# Patient Record
Sex: Female | Born: 1955
Health system: Southern US, Community
[De-identification: ages and names within clinical notes are randomized; demographics above are authoritative.]

## PROBLEM LIST (undated history)

## (undated) DIAGNOSIS — M1990 Unspecified osteoarthritis, unspecified site: Secondary | ICD-10-CM

## (undated) DIAGNOSIS — J9612 Chronic respiratory failure with hypercapnia: Secondary | ICD-10-CM

## (undated) DIAGNOSIS — J449 Chronic obstructive pulmonary disease, unspecified: Secondary | ICD-10-CM

## (undated) DIAGNOSIS — I10 Essential (primary) hypertension: Secondary | ICD-10-CM

## (undated) DIAGNOSIS — J9611 Chronic respiratory failure with hypoxia: Secondary | ICD-10-CM

## (undated) DIAGNOSIS — J45909 Unspecified asthma, uncomplicated: Secondary | ICD-10-CM

## (undated) DIAGNOSIS — C801 Malignant (primary) neoplasm, unspecified: Secondary | ICD-10-CM

## (undated) HISTORY — PX: TRACHEOSTOMY: SUR1362

## (undated) HISTORY — PX: TRACHEOSTOMY CLOSURE: SHX458

## (undated) HISTORY — PX: ABDOMINAL HYSTERECTOMY: SHX81

---

## 2016-06-26 LAB — PULMONARY FUNCTION TEST

## 2017-02-19 LAB — PULMONARY FUNCTION TEST

## 2018-10-28 ENCOUNTER — Other Ambulatory Visit: Payer: Self-pay

## 2018-10-28 ENCOUNTER — Encounter (HOSPITAL_COMMUNITY): Payer: Self-pay

## 2018-10-28 ENCOUNTER — Inpatient Hospital Stay (HOSPITAL_COMMUNITY)
Admission: EM | Admit: 2018-10-28 | Discharge: 2018-11-05 | DRG: 189 | Disposition: A | Discharge status: Home Health | Payer: Medicaid Other | Attending: Internal Medicine | Admitting: Internal Medicine

## 2018-10-28 ENCOUNTER — Emergency Department (HOSPITAL_COMMUNITY): Payer: Medicaid Other

## 2018-10-28 DIAGNOSIS — Z6837 Body mass index (BMI) 37.0-37.9, adult: Secondary | ICD-10-CM | POA: Diagnosis not present

## 2018-10-28 DIAGNOSIS — R739 Hyperglycemia, unspecified: Secondary | ICD-10-CM

## 2018-10-28 DIAGNOSIS — E872 Acidosis: Secondary | ICD-10-CM | POA: Diagnosis present

## 2018-10-28 DIAGNOSIS — J9621 Acute and chronic respiratory failure with hypoxia: Secondary | ICD-10-CM | POA: Diagnosis present

## 2018-10-28 DIAGNOSIS — J441 Chronic obstructive pulmonary disease with (acute) exacerbation: Secondary | ICD-10-CM

## 2018-10-28 DIAGNOSIS — R05 Cough: Secondary | ICD-10-CM | POA: Diagnosis present

## 2018-10-28 DIAGNOSIS — Z9981 Dependence on supplemental oxygen: Secondary | ICD-10-CM

## 2018-10-28 DIAGNOSIS — D649 Anemia, unspecified: Secondary | ICD-10-CM | POA: Diagnosis present

## 2018-10-28 DIAGNOSIS — M1990 Unspecified osteoarthritis, unspecified site: Secondary | ICD-10-CM | POA: Diagnosis present

## 2018-10-28 DIAGNOSIS — R059 Cough, unspecified: Secondary | ICD-10-CM

## 2018-10-28 DIAGNOSIS — I10 Essential (primary) hypertension: Secondary | ICD-10-CM | POA: Diagnosis present

## 2018-10-28 DIAGNOSIS — T380X5A Adverse effect of glucocorticoids and synthetic analogues, initial encounter: Secondary | ICD-10-CM | POA: Diagnosis not present

## 2018-10-28 DIAGNOSIS — J9622 Acute and chronic respiratory failure with hypercapnia: Secondary | ICD-10-CM | POA: Diagnosis present

## 2018-10-28 DIAGNOSIS — R0602 Shortness of breath: Secondary | ICD-10-CM

## 2018-10-28 DIAGNOSIS — Z87891 Personal history of nicotine dependence: Secondary | ICD-10-CM | POA: Diagnosis not present

## 2018-10-28 DIAGNOSIS — J9612 Chronic respiratory failure with hypercapnia: Secondary | ICD-10-CM

## 2018-10-28 DIAGNOSIS — J9611 Chronic respiratory failure with hypoxia: Secondary | ICD-10-CM | POA: Diagnosis present

## 2018-10-28 DIAGNOSIS — E876 Hypokalemia: Secondary | ICD-10-CM | POA: Diagnosis present

## 2018-10-28 DIAGNOSIS — J449 Chronic obstructive pulmonary disease, unspecified: Secondary | ICD-10-CM | POA: Diagnosis present

## 2018-10-28 DIAGNOSIS — E669 Obesity, unspecified: Secondary | ICD-10-CM | POA: Diagnosis present

## 2018-10-28 DIAGNOSIS — E8729 Other acidosis: Secondary | ICD-10-CM

## 2018-10-28 DIAGNOSIS — F419 Anxiety disorder, unspecified: Secondary | ICD-10-CM | POA: Diagnosis present

## 2018-10-28 DIAGNOSIS — F40240 Claustrophobia: Secondary | ICD-10-CM | POA: Diagnosis present

## 2018-10-28 HISTORY — DX: Unspecified osteoarthritis, unspecified site: M19.90

## 2018-10-28 HISTORY — DX: Unspecified asthma, uncomplicated: J45.909

## 2018-10-28 HISTORY — DX: Essential (primary) hypertension: I10

## 2018-10-28 HISTORY — DX: Chronic respiratory failure with hypoxia: J96.11

## 2018-10-28 HISTORY — DX: Malignant (primary) neoplasm, unspecified: C80.1

## 2018-10-28 HISTORY — DX: Chronic obstructive pulmonary disease, unspecified: J44.9

## 2018-10-28 HISTORY — DX: Chronic respiratory failure with hypercapnia: J96.12

## 2018-10-28 LAB — CBC WITH DIFFERENTIAL/PLATELET
Abs Immature Granulocytes: 0.02 10*3/uL (ref 0.00–0.07)
Basophils Absolute: 0.1 10*3/uL (ref 0.0–0.1)
Basophils Relative: 1 %
EOS PCT: 6 %
Eosinophils Absolute: 0.4 10*3/uL (ref 0.0–0.5)
HEMATOCRIT: 38.5 % (ref 36.0–46.0)
Hemoglobin: 11.9 g/dL — ABNORMAL LOW (ref 12.0–15.0)
Immature Granulocytes: 0 %
Lymphocytes Relative: 24 %
Lymphs Abs: 1.7 10*3/uL (ref 0.7–4.0)
MCH: 28.5 pg (ref 26.0–34.0)
MCHC: 30.9 g/dL (ref 30.0–36.0)
MCV: 92.3 fL (ref 80.0–100.0)
MONO ABS: 1 10*3/uL (ref 0.1–1.0)
Monocytes Relative: 14 %
Neutro Abs: 3.9 10*3/uL (ref 1.7–7.7)
Neutrophils Relative %: 55 %
PLATELETS: 343 10*3/uL (ref 150–400)
RBC: 4.17 MIL/uL (ref 3.87–5.11)
RDW: 13.9 % (ref 11.5–15.5)
WBC: 7.1 10*3/uL (ref 4.0–10.5)
nRBC: 0 % (ref 0.0–0.2)

## 2018-10-28 LAB — POCT I-STAT 7, (LYTES, BLD GAS, ICA,H+H)
Acid-Base Excess: 5 mmol/L — ABNORMAL HIGH (ref 0.0–2.0)
Bicarbonate: 32.3 mmol/L — ABNORMAL HIGH (ref 20.0–28.0)
Calcium, Ion: 1.25 mmol/L (ref 1.15–1.40)
HCT: 35 % — ABNORMAL LOW (ref 36.0–46.0)
Hemoglobin: 11.9 g/dL — ABNORMAL LOW (ref 12.0–15.0)
O2 SAT: 100 %
PH ART: 7.32 — AB (ref 7.350–7.450)
Patient temperature: 98.6
Potassium: 3.3 mmol/L — ABNORMAL LOW (ref 3.5–5.1)
Sodium: 139 mmol/L (ref 135–145)
TCO2: 34 mmol/L — AB (ref 22–32)
pCO2 arterial: 62.8 mmHg — ABNORMAL HIGH (ref 32.0–48.0)
pO2, Arterial: 196 mmHg — ABNORMAL HIGH (ref 83.0–108.0)

## 2018-10-28 LAB — HEPATIC FUNCTION PANEL
ALT: 10 U/L (ref 0–44)
AST: 17 U/L (ref 15–41)
Albumin: 3.5 g/dL (ref 3.5–5.0)
Alkaline Phosphatase: 70 U/L (ref 38–126)
Bilirubin, Direct: <0.1 mg/dL (ref 0.0–0.2)
Total Bilirubin: 0.3 mg/dL (ref 0.3–1.2)
Total Protein: 6.9 g/dL (ref 6.5–8.1)

## 2018-10-28 LAB — BASIC METABOLIC PANEL
Anion gap: 9 (ref 5–15)
BUN: 6 mg/dL — ABNORMAL LOW (ref 8–23)
CO2: 28 mmol/L (ref 22–32)
Calcium: 9.4 mg/dL (ref 8.9–10.3)
Chloride: 102 mmol/L (ref 98–111)
Creatinine, Ser: 0.81 mg/dL (ref 0.44–1.00)
GFR calc Af Amer: >60 mL/min (ref >60)
Glucose, Bld: 134 mg/dL — ABNORMAL HIGH (ref 70–99)
Potassium: 3.7 mmol/L (ref 3.5–5.1)
Sodium: 139 mmol/L (ref 135–145)

## 2018-10-28 LAB — RESPIRATORY PANEL BY PCR
Adenovirus: NOT DETECTED
BORDETELLA PERTUSSIS-RVPCR: NOT DETECTED
Chlamydophila pneumoniae: NOT DETECTED
Coronavirus 229E: NOT DETECTED
Coronavirus HKU1: NOT DETECTED
Coronavirus NL63: NOT DETECTED
Coronavirus OC43: NOT DETECTED
INFLUENZA A-RVPPCR: NOT DETECTED
Influenza B: NOT DETECTED
Metapneumovirus: NOT DETECTED
Mycoplasma pneumoniae: NOT DETECTED
Parainfluenza Virus 1: NOT DETECTED
Parainfluenza Virus 2: NOT DETECTED
Parainfluenza Virus 3: NOT DETECTED
Parainfluenza Virus 4: NOT DETECTED
Respiratory Syncytial Virus: NOT DETECTED
Rhinovirus / Enterovirus: NOT DETECTED

## 2018-10-28 LAB — INFLUENZA PANEL BY PCR (TYPE A & B)
Influenza A By PCR: NEGATIVE
Influenza B By PCR: NEGATIVE

## 2018-10-28 LAB — LACTATE DEHYDROGENASE: LDH: 134 U/L (ref 98–192)

## 2018-10-28 LAB — HIV ANTIBODY (ROUTINE TESTING W REFLEX): HIV Screen 4th Generation wRfx: NONREACTIVE

## 2018-10-28 MED ORDER — IPRATROPIUM BROMIDE 0.02 % IN SOLN
0.5000 mg | Freq: Once | RESPIRATORY_TRACT | Status: AC
  Administered 2018-10-28: 0.5 mg via RESPIRATORY_TRACT
  Filled 2018-10-28: qty 2.5

## 2018-10-28 MED ORDER — GUAIFENESIN-DM 100-10 MG/5ML PO SYRP
5.0000 mL | ORAL_SOLUTION | ORAL | Status: DC | PRN
  Administered 2018-10-28 – 2018-11-01 (×10): 5 mL via ORAL
  Filled 2018-10-28 (×10): qty 5

## 2018-10-28 MED ORDER — SODIUM CHLORIDE 0.9 % IV SOLN
INTRAVENOUS | Status: DC
  Administered 2018-10-28 (×2): via INTRAVENOUS

## 2018-10-28 MED ORDER — BENZONATATE 100 MG PO CAPS
100.0000 mg | ORAL_CAPSULE | Freq: Three times a day (TID) | ORAL | Status: DC | PRN
  Administered 2018-10-28 – 2018-10-29 (×3): 100 mg via ORAL
  Filled 2018-10-28 (×3): qty 1

## 2018-10-28 MED ORDER — PREDNISONE 20 MG PO TABS: 40.0000 mg | ORAL_TABLET | Freq: Every day | ORAL | Status: DC

## 2018-10-28 MED ORDER — LORAZEPAM 2 MG/ML IJ SOLN
0.5000 mg | Freq: Once | INTRAMUSCULAR | Status: AC
  Administered 2018-10-28: 0.5 mg via INTRAVENOUS
  Filled 2018-10-28: qty 1

## 2018-10-28 MED ORDER — MAGNESIUM SULFATE 2 GM/50ML IV SOLN
2.0000 g | Freq: Once | INTRAVENOUS | Status: AC
  Administered 2018-10-28: 2 g via INTRAVENOUS
  Filled 2018-10-28 (×2): qty 50

## 2018-10-28 MED ORDER — METHYLPREDNISOLONE SODIUM SUCC 125 MG IJ SOLR: 60.0000 mg | Freq: Four times a day (QID) | INTRAMUSCULAR | Status: DC

## 2018-10-28 MED ORDER — TRAMADOL HCL 50 MG PO TABS
25.0000 mg | ORAL_TABLET | Freq: Three times a day (TID) | ORAL | Status: DC | PRN
  Administered 2018-10-28 – 2018-11-03 (×6): 25 mg via ORAL
  Filled 2018-10-28 (×6): qty 1

## 2018-10-28 MED ORDER — DOXYCYCLINE HYCLATE 100 MG PO TABS
100.0000 mg | ORAL_TABLET | Freq: Two times a day (BID) | ORAL | Status: DC
  Administered 2018-10-28 – 2018-11-04 (×16): 100 mg via ORAL
  Filled 2018-10-28 (×16): qty 1

## 2018-10-28 MED ORDER — LEVALBUTEROL HCL 0.63 MG/3ML IN NEBU: 0.6300 mg | INHALATION_SOLUTION | Freq: Four times a day (QID) | RESPIRATORY_TRACT | Status: DC | PRN

## 2018-10-28 MED ORDER — MOMETASONE FURO-FORMOTEROL FUM 200-5 MCG/ACT IN AERO
1.0000 | INHALATION_SPRAY | Freq: Two times a day (BID) | RESPIRATORY_TRACT | Status: DC
  Administered 2018-10-28 – 2018-11-01 (×8): 1 via RESPIRATORY_TRACT
  Filled 2018-10-28: qty 8.8

## 2018-10-28 MED ORDER — UMECLIDINIUM BROMIDE 62.5 MCG/INH IN AEPB
1.0000 | INHALATION_SPRAY | Freq: Every day | RESPIRATORY_TRACT | Status: DC
  Administered 2018-10-29 – 2018-11-02 (×5): 1 via RESPIRATORY_TRACT
  Filled 2018-10-28: qty 7

## 2018-10-28 MED ORDER — ALBUTEROL (5 MG/ML) CONTINUOUS INHALATION SOLN
10.0000 mg/h | INHALATION_SOLUTION | RESPIRATORY_TRACT | Status: DC
  Administered 2018-10-28: 10 mg/h via RESPIRATORY_TRACT
  Filled 2018-10-28: qty 20

## 2018-10-28 MED ORDER — POTASSIUM CHLORIDE CRYS ER 20 MEQ PO TBCR
40.0000 meq | EXTENDED_RELEASE_TABLET | Freq: Once | ORAL | Status: AC
  Administered 2018-10-28: 40 meq via ORAL
  Filled 2018-10-28: qty 2

## 2018-10-28 MED ORDER — ACETAMINOPHEN 500 MG PO TABS
1000.0000 mg | ORAL_TABLET | Freq: Once | ORAL | Status: AC
  Administered 2018-10-28: 1000 mg via ORAL
  Filled 2018-10-28: qty 2

## 2018-10-28 MED ORDER — LEVALBUTEROL HCL 0.63 MG/3ML IN NEBU
0.6300 mg | INHALATION_SOLUTION | Freq: Four times a day (QID) | RESPIRATORY_TRACT | Status: DC
  Administered 2018-10-28 – 2018-10-29 (×7): 0.63 mg via RESPIRATORY_TRACT
  Filled 2018-10-28 (×7): qty 3

## 2018-10-28 MED ORDER — IBUPROFEN 400 MG PO TABS
400.0000 mg | ORAL_TABLET | Freq: Four times a day (QID) | ORAL | Status: DC | PRN
  Administered 2018-10-28 – 2018-11-03 (×11): 400 mg via ORAL
  Filled 2018-10-28 (×11): qty 1

## 2018-10-28 MED ORDER — METHYLPREDNISOLONE SODIUM SUCC 125 MG IJ SOLR
60.0000 mg | Freq: Four times a day (QID) | INTRAMUSCULAR | Status: DC
  Administered 2018-10-28 – 2018-10-29 (×5): 60 mg via INTRAVENOUS
  Filled 2018-10-28 (×5): qty 2

## 2018-10-28 MED ORDER — ORAL CARE MOUTH RINSE
15.0000 mL | Freq: Two times a day (BID) | OROMUCOSAL | Status: DC
  Administered 2018-10-28 – 2018-11-01 (×6): 15 mL via OROMUCOSAL

## 2018-10-28 MED ORDER — ACETAMINOPHEN 325 MG PO TABS: 650.0000 mg | ORAL_TABLET | Freq: Four times a day (QID) | ORAL | Status: DC | PRN

## 2018-10-28 MED ORDER — METHYLPREDNISOLONE SODIUM SUCC 125 MG IJ SOLR
125.0000 mg | Freq: Once | INTRAMUSCULAR | Status: AC
  Administered 2018-10-28: 125 mg via INTRAVENOUS
  Filled 2018-10-28 (×2): qty 2

## 2018-10-28 MED ORDER — GUAIFENESIN ER 600 MG PO TB12
600.0000 mg | ORAL_TABLET | Freq: Two times a day (BID) | ORAL | Status: DC
  Administered 2018-10-28 – 2018-10-29 (×3): 600 mg via ORAL
  Filled 2018-10-28 (×3): qty 1

## 2018-10-28 MED ORDER — FENTANYL CITRATE (PF) 100 MCG/2ML IJ SOLN
50.0000 ug | Freq: Once | INTRAMUSCULAR | Status: AC
  Administered 2018-10-28: 50 ug via INTRAVENOUS
  Filled 2018-10-28: qty 2

## 2018-10-28 MED ORDER — ENOXAPARIN SODIUM 40 MG/0.4ML ~~LOC~~ SOLN
40.0000 mg | Freq: Every day | SUBCUTANEOUS | Status: DC
  Administered 2018-10-28 – 2018-11-04 (×8): 40 mg via SUBCUTANEOUS
  Filled 2018-10-28 (×10): qty 0.4

## 2018-10-28 MED ORDER — SODIUM CHLORIDE 0.9 % IV SOLN
1.0000 g | Freq: Every day | INTRAVENOUS | Status: DC
  Administered 2018-10-28: 1 g via INTRAVENOUS
  Filled 2018-10-28: qty 10

## 2018-10-28 NOTE — ED Notes (Signed)
Spoke with [facility] Charge regarding pt. Charge nurse states that unit will need to obtain another progressive care monitor before patient can be brought up.

## 2018-10-28 NOTE — Progress Notes (Signed)
Patient arrived to 4[facility] room 10 at this time. Telemetry applied and CCMD notified. V/s and assessment done. CHG bath done. Patient oriented to room and how to call nurse with any needs.  Emelda Fear, RN

## 2018-10-28 NOTE — ED Notes (Signed)
Per [facility], unable to accept the pt at this time due to their algorithm.  Charge nurse notified.

## 2018-10-28 NOTE — Evaluation (Signed)
Physical Therapy Evaluation Patient Details Name: Bianca Murray MRN: [id number] DOB: [birth year] Today's Date: 10/28/2018   History of Present Illness  Pt is a 63 y/o female admitted secondary to worsening SOB likely from COPD exacerbation. PMH includes COPD on home O2, asthma, HTN, cancer, and tracheostomy s/p reversal.   Clinical Impression  Pt admitted secondary to problem above with deficits below. Pt with shakiness during gait, requiring min guard A. Per pt, she felt shakiness was due to her medication. Feel pt will progress well and will be able to return home with HHPT with assist from her daughter at d/c. Will continue to follow acutely to maximize functional mobility independence and safety.     Follow Up Recommendations Home health PT;Supervision for mobility/OOB    Equipment Recommendations  3in1 (PT);Other (comment)(rollator )    Recommendations for Other Services       Precautions / Restrictions Precautions Precautions: Fall Restrictions Weight Bearing Restrictions: No      Mobility  Bed Mobility Overal bed mobility: Needs Assistance Bed Mobility: Supine to Sit;Sit to Supine     Supine to sit: Supervision Sit to supine: Supervision   General bed mobility comments: Supervision for line managemenr.   Transfers Overall transfer level: Needs assistance Equipment used: None Transfers: Sit to/from Stand Sit to Stand: Min guard         General transfer comment: Min guard for steadying assist to stand. One mild LOB once standing, however, pt able to self recover.   Ambulation/Gait Ambulation/Gait assistance: Min guard Gait Distance (Feet): 20 Feet Assistive device: None Gait Pattern/deviations: Step-through pattern;Decreased stride length Gait velocity: Decreased    General Gait Details: Slow, shaky gait. Pt reports feeling slightly weaker, but reports she feels shakiness is from her medicine. Required min guard for steadying. Pt fatiguing easily, so  distance limited to bathroom and back.   Stairs            Wheelchair Mobility    Modified Rankin (Stroke Patients Only)       Balance Overall balance assessment: Needs assistance Sitting-balance support: No upper extremity supported;Feet supported Sitting balance-Leahy Scale: Good     Standing balance support: No upper extremity supported;During functional activity Standing balance-Leahy Scale: Fair                               Pertinent Vitals/Pain Pain Assessment: No/denies pain    Home Living Family/patient expects to be discharged to:: Private residence Living Arrangements: Children Available Help at Discharge: Family;Available PRN/intermittently Type of Home: House Home Access: Stairs to enter Entrance Stairs-Rails: None Entrance Stairs-Number of Steps: 2(porch step and threshold step ) Home Layout: Two level;Able to live on main level with bedroom/bathroom Home Equipment: None      Prior Function Level of Independence: Independent               Hand Dominance        Extremity/Trunk Assessment   Upper Extremity Assessment Upper Extremity Assessment: Defer to OT evaluation    Lower Extremity Assessment Lower Extremity Assessment: Generalized weakness    Cervical / Trunk Assessment Cervical / Trunk Assessment: Normal  Communication   Communication: No difficulties  Cognition Arousal/Alertness: Awake/alert Behavior During Therapy: WFL for tasks assessed/performed Overall Cognitive Status: Within Functional Limits for tasks assessed  General Comments General comments (skin integrity, edema, etc.): Oxygen sats WFL on 4L of oxygen     Exercises     Assessment/Plan    PT Assessment Patient needs continued PT services  PT Problem List Cardiopulmonary status limiting activity;Decreased strength;Decreased activity tolerance;Decreased balance;Decreased mobility;Decreased  knowledge of use of DME;Decreased knowledge of precautions       PT Treatment Interventions DME instruction;Gait training;Stair training;Functional mobility training;Therapeutic activities;Therapeutic exercise;Balance training;Patient/family education    PT Goals (Current goals can be found in the Care Plan section)  Acute Rehab PT Goals Patient Stated Goal: to go home  PT Goal Formulation: With patient Time For Goal Achievement: 11/11/18 Potential to Achieve Goals: Good    Frequency Min 3X/week   Barriers to discharge        Co-evaluation               AM-PAC PT "6 Clicks" Mobility  Outcome Measure Help needed turning from your back to your side while in a flat bed without using bedrails?: None Help needed moving from lying on your back to sitting on the side of a flat bed without using bedrails?: None Help needed moving to and from a bed to a chair (including a wheelchair)?: A Little Help needed standing up from a chair using your arms (e.g., wheelchair or bedside chair)?: A Little Help needed to walk in hospital room?: A Little Help needed climbing 3-5 steps with a railing? : A Lot 6 Click Score: 19    End of Session Equipment Utilized During Treatment: Oxygen Activity Tolerance: Patient limited by fatigue Patient left: in bed;with call bell/phone within reach Nurse Communication: Mobility status PT Visit Diagnosis: Unsteadiness on feet (R26.81);Muscle weakness (generalized) (M62.81)    Time: 8657-8469 PT Time Calculation (min) (ACUTE ONLY): 18 min   Charges:   PT Evaluation $PT Eval Moderate Complexity: [place], PT, DPT  Acute Rehabilitation Services  Pager: [phone] Office: [phone]   Rudean Hitt 10/28/2018, 9:56 AM

## 2018-10-28 NOTE — ED Provider Notes (Signed)
[facility] EMERGENCY DEPARTMENT Provider Note  CSN: [id number] Arrival date & time: 10/28/18 0151  Chief Complaint(s) Shortness of Breath  HPI Bianca Murray is a 63 y.o. female with a history of COPD on 3 to 4 L nasal cannula at home who presents to the emergency department with several days of gradually worsening shortness of breath.  Patient endorsed nasal congestion and nonproductive cough.  No fevers.  Has tried over-the-counter medicine and breathing treatments without significant improvement.  Patient called EMS due to the severity of her shortness of breath.  Patient was given DuoNeb in route resulting in minimal relief.  She denies any associated chest pain.  No nausea or vomiting.  No abdominal pain.  No other physical complaints.  HPI  Past Medical History Past Medical History:  Diagnosis Date  . Arthritis   . Asthma   . Cancer ([place])   . COPD (chronic obstructive pulmonary disease) ([place])   . Hypertension    Patient Active Problem List   Diagnosis Date Noted  . COPD with acute exacerbation ([place]) 10/28/2018   Home Medication(s) Prior to Admission medications   Not on File                                                                                                                                    Past Surgical History Past Surgical History:  Procedure Laterality Date  . ABDOMINAL HYSTERECTOMY    . TRACHEOSTOMY     reversed   Family History History reviewed. No pertinent family history.  Social History Social History   Tobacco Use  . Smoking status: Former Research scientist (life sciences)  . Smokeless tobacco: Never Used  Substance Use Topics  . Alcohol use: Not Currently  . Drug use: Not Currently   Allergies Patient has no known allergies.  Review of Systems Review of Systems All other systems are reviewed and are negative for acute change except as noted in the HPI  Physical Exam Vital Signs  I have reviewed the triage vital signs BP (!) 115/93    Pulse (!) 115   Temp 98.5 F (36.9 C) (Oral)   Resp (!) 22   Ht 5\' 2"  (1.575 m)   Wt 92.1 kg   SpO2 99%   BMI 37.13 kg/m   Physical Exam Vitals signs reviewed.  Constitutional:      General: She is not in acute distress.    Appearance: She is well-developed. She is not diaphoretic.  HENT:     Head: Normocephalic and atraumatic.     Nose: Nose normal.  Eyes:     General: No scleral icterus.       Right eye: No discharge.        Left eye: No discharge.     Conjunctiva/sclera: Conjunctivae normal.     Pupils: Pupils are equal, round, and reactive to light.  Neck:     Musculoskeletal: Normal range of motion and  neck supple.  Cardiovascular:     Rate and Rhythm: Normal rate and regular rhythm.     Heart sounds: No murmur. No friction rub. No gallop.   Pulmonary:     Effort: Tachypnea, accessory muscle usage and respiratory distress present.     Breath sounds: Decreased air movement (in lower long fields with prolonged Exp phase) present. No stridor. Examination of the right-middle field reveals wheezing. Examination of the left-middle field reveals wheezing. Examination of the right-lower field reveals wheezing. Examination of the left-lower field reveals wheezing. Wheezing (fain exp wheezing) present. No rales.  Abdominal:     General: There is no distension.     Palpations: Abdomen is soft.     Tenderness: There is no abdominal tenderness.  Musculoskeletal:        General: No tenderness.  Skin:    General: Skin is warm and dry.     Findings: No erythema or rash.  Neurological:     Mental Status: She is alert and oriented to person, place, and time.     ED Results and Treatments Labs (all labs ordered are listed, but only abnormal results are displayed) Labs Reviewed  CBC WITH DIFFERENTIAL/PLATELET - Abnormal; Notable for the following components:      Result Value   Hemoglobin 11.9 (*)    All other components within normal limits  BASIC METABOLIC PANEL - Abnormal;  Notable for the following components:   Glucose, Bld 134 (*)    BUN 6 (*)    All other components within normal limits  POCT I-STAT 7, (LYTES, BLD GAS, ICA,H+H) - Abnormal; Notable for the following components:   pH, Arterial 7.320 (*)    pCO2 arterial 62.8 (*)    pO2, Arterial 196.0 (*)    Bicarbonate 32.3 (*)    TCO2 34 (*)    Acid-Base Excess 5.0 (*)    Potassium 3.3 (*)    HCT 35.0 (*)    Hemoglobin 11.9 (*)    All other components within normal limits  BLOOD GAS, ARTERIAL  HIV ANTIBODY (ROUTINE TESTING W REFLEX)                                                                                                                         EKG  EKG Interpretation  Date/Time:  Tuesday October 28 2018 01:55:23 EDT Ventricular Rate:  114 PR Interval:    QRS Duration: 68 QT Interval:  314 QTC Calculation: 433 R Axis:   68 Text Interpretation:  Sinus tachycardia NO STEMI. No old tracing to compare Confirmed by Addison Lank [id number]) on 10/28/2018 2:49:26 AM      Radiology Dg Chest Port 1 View  Result Date: 10/28/2018 CLINICAL DATA:  Patient c/o SOB w/ productive cough x5-6 days. Hx of intubation and trach. NAD noted at this time EXAM: PORTABLE CHEST 1 VIEW COMPARISON:  None. FINDINGS: Cardiac silhouette is normal in size. No mediastinal or hilar masses. No evidence of adenopathy. Lungs are hyperexpanded. There  is relative lucency in the upper lobes consistent with emphysema. Linear opacities noted in the right mid lung along the minor fissure and left mid lung, consistent with scarring or atelectasis. Lungs are otherwise clear. No pleural effusion or pneumothorax. Skeletal structures are grossly intact. IMPRESSION: 1. No acute cardiopulmonary disease. 2. COPD/emphysema. Electronically Signed   By: Lajean Manes M.D.   On: 10/28/2018 02:38   Pertinent labs & imaging results that were available during my care of the patient were reviewed by me and considered in my medical decision making (see  chart for details).  Medications Ordered in ED Medications  albuterol (PROVENTIL,VENTOLIN) solution continuous neb (0 mg/hr Nebulization Stopped 10/28/18 0344)  albuterol (PROVENTIL,VENTOLIN) solution continuous neb (10 mg/hr Nebulization New Bag/Given 10/28/18 0426)  enoxaparin (LOVENOX) injection 40 mg (has no administration in time range)  cefTRIAXone (ROCEPHIN) 1 g in sodium chloride 0.9 % 100 mL IVPB (has no administration in time range)  methylPREDNISolone sodium succinate (SOLU-MEDROL) 125 mg/2 mL injection 60 mg (has no administration in time range)    Followed by  predniSONE (DELTASONE) tablet 40 mg (has no administration in time range)  mometasone-formoterol (DULERA) 200-5 MCG/ACT inhaler 1 puff (has no administration in time range)  levalbuterol (XOPENEX) nebulizer solution 0.63 mg (has no administration in time range)  umeclidinium bromide (INCRUSE ELLIPTA) 62.5 MCG/INH 1 puff (has no administration in time range)  magnesium sulfate IVPB 2 g 50 mL (0 g Intravenous Stopped 10/28/18 0344)  ipratropium (ATROVENT) nebulizer solution 0.5 mg (0.5 mg Nebulization Given 10/28/18 0239)  methylPREDNISolone sodium succinate (SOLU-MEDROL) 125 mg/2 mL injection 125 mg (125 mg Intravenous Given 10/28/18 0244)  fentaNYL (SUBLIMAZE) injection 50 mcg (50 mcg Intravenous Given 10/28/18 0412)  acetaminophen (TYLENOL) tablet 1,000 mg (1,000 mg Oral Given 10/28/18 0412)  LORazepam (ATIVAN) injection 0.5 mg (0.5 mg Intravenous Given 10/28/18 0453)                                                                                                                                    Procedures .Critical Care Performed by: Fatima Blank, MD Authorized by: Fatima Blank, MD     CRITICAL CARE Performed by: Grayce Sessions Cardama Total critical care time: 40 minutes Critical care time was exclusive of separately billable procedures and treating other patients. Critical care was necessary to treat  or prevent imminent or life-threatening deterioration. Critical care was time spent personally by me on the following activities: development of treatment plan with patient and/or surrogate as well as nursing, discussions with consultants, evaluation of patient's response to treatment, examination of patient, obtaining history from patient or surrogate, ordering and performing treatments and interventions, ordering and review of laboratory studies, ordering and review of radiographic studies, pulse oximetry and re-evaluation of patient's condition.   (including critical care time)  Medical Decision Making / ED Course I have reviewed the nursing notes for this encounter and the patient's prior records (  if available in EHR or on provided paperwork).    Patient presents with shortness of breath.  Consistent with COPD exacerbation.  Auscultation with very poor air movement and prolonged expiratory phase.    Patient is afebrile with stable vital signs.  Labs without leukocytosis. Chest x-ray without evidence of pneumonia.  Provided with continuous DuoNeb, IV steroids and magnesium.  Patient had mild improvement on reassessment but still has increased work of breathing.  Gas notable for respiratory acidosis.  Placed on BiPAP and provided with additional breathing treatments.  We will admit patient for continued management.  Final Clinical Impression(s) / ED Diagnoses Final diagnoses:  SOB (shortness of breath)  COPD exacerbation (HCC)  Respiratory acidosis      This chart was dictated using voice recognition software.  Despite best efforts to proofread,  errors can occur which can change the documentation meaning.   Fatima Blank, MD 10/28/18 [phone]

## 2018-10-28 NOTE — ED Notes (Signed)
Flu swab/PCR sent to lab; Provider states will write order.

## 2018-10-28 NOTE — Evaluation (Signed)
Occupational Therapy Evaluation Patient Details Name: Bianca Murray MRN: [id number] DOB: July 28, 1956 Today's Date: 10/28/2018    History of Present Illness Pt is a 63 y/o female admitted secondary to worsening SOB likely from COPD exacerbation. PMH includes COPD on home O2, asthma, HTN, cancer, and tracheostomy s/p reversal.    Clinical Impression   PTA, pt was living with her daughter who assist pt with bathing and IADLs. Pt reporting that she just moved to [place] from [place] at the end of February. Pt currently performing ADLs and functional mobility at [facility] A level. Pt presenting with decreased activity tolerance as seen by fatigue and SOB. SpO2 staying >90% on 4L O2. Pt reporting she feels shaky when walking and noted BLE trembling and decreased balance. Pt would benefit from further acute OT to facilitate safe dc. Recommend dc to home once medically stable per physician.     Follow Up Recommendations  No OT follow up;Supervision - Intermittent    Equipment Recommendations  3 in 1 bedside commode    Recommendations for Other Services       Precautions / Restrictions Precautions Precautions: Fall Restrictions Weight Bearing Restrictions: No      Mobility Bed Mobility Overal bed mobility: Needs Assistance Bed Mobility: Supine to Sit;Sit to Supine     Supine to sit: Supervision Sit to supine: Supervision   General bed mobility comments: Supervision for line managemenr.   Transfers Overall transfer level: Needs assistance Equipment used: None Transfers: Sit to/from Stand Sit to Stand: Min guard         General transfer comment: Min guard for steadying assist to stand. One mild LOB once standing, however, pt able to self recover.     Balance Overall balance assessment: Needs assistance Sitting-balance support: No upper extremity supported;Feet supported Sitting balance-Leahy Scale: Good     Standing balance support: No upper extremity  supported;During functional activity Standing balance-Leahy Scale: Fair                             ADL either performed or assessed with clinical judgement   ADL Overall ADL's : Needs assistance/impaired Eating/Feeding: Independent;Sitting   Grooming: Wash/dry face;Standing;Min guard Grooming Details (indicate cue type and reason): Pt performing grooming at sink. However, fatigues quickly and required standing rest break. SpO2 >90% throughotu on 4L O2. Noted SOB.  Upper Body Bathing: Supervision/ safety;Sitting   Lower Body Bathing: Min guard;Sit to/from stand   Upper Body Dressing : Supervision/safety;Sitting   Lower Body Dressing: Min guard;Sit to/from stand   Toilet Transfer: Min guard;Ambulation(Simulated in room)           Functional mobility during ADLs: Min guard General ADL Comments: Pt performing ADLs and functional mobility with supervision-Min Guard A. Presenting with decreased activity toelrance as seen by fatigue and SOB. SpO2 staying >90% on 4L.      Vision         Perception     Praxis      Pertinent Vitals/Pain Pain Assessment: No/denies pain     Hand Dominance     Extremity/Trunk Assessment Upper Extremity Assessment Upper Extremity Assessment: Generalized weakness   Lower Extremity Assessment Lower Extremity Assessment: Defer to PT evaluation   Cervical / Trunk Assessment Cervical / Trunk Assessment: Normal   Communication Communication Communication: No difficulties   Cognition Arousal/Alertness: Awake/alert Behavior During Therapy: WFL for tasks assessed/performed Overall Cognitive Status: Within Functional Limits for tasks assessed  General Comments  SpO2 >90% on 4L    Exercises     Shoulder Instructions      Home Living Family/patient expects to be discharged to:: Private residence Living Arrangements: Children Available Help at Discharge: Family;Available  PRN/intermittently Type of Home: House Home Access: Stairs to enter [employer] of Steps: 2(porch step and threshold step ) Entrance Stairs-Rails: None Home Layout: Two level;Able to live on main level with bedroom/bathroom     Bathroom Shower/Tub: Teacher, early years/pre: Standard     Home Equipment: None   Additional Comments: Wears 4L home O2      Prior Functioning/Environment Level of Independence: Independent                 OT Problem List: Decreased range of motion;Decreased activity tolerance;Impaired balance (sitting and/or standing);Decreased knowledge of use of DME or AE;Decreased knowledge of precautions;Cardiopulmonary status limiting activity      OT Treatment/Interventions: Self-care/ADL training;Therapeutic exercise;Energy conservation;DME and/or AE instruction;Therapeutic activities;Patient/family education    OT Goals(Current goals can be found in the care plan section) Acute Rehab OT Goals Patient Stated Goal: to go home  OT Goal Formulation: With patient Time For Goal Achievement: 11/11/18 Potential to Achieve Goals: Good  OT Frequency: Min 2X/week   Barriers to D/C:            Co-evaluation              AM-PAC OT "6 Clicks" Daily Activity     Outcome Measure Help from another person eating meals?: None Help from another person taking care of personal grooming?: A Little Help from another person toileting, which includes using toliet, bedpan, or urinal?: A Little Help from another person bathing (including washing, rinsing, drying)?: A Little Help from another person to put on and taking off regular upper body clothing?: None Help from another person to put on and taking off regular lower body clothing?: A Little 6 Click Score: 20   End of Session Equipment Utilized During Treatment: Oxygen(4L) Nurse Communication: Mobility status  Activity Tolerance: Patient tolerated treatment well Patient left: in bed;with  call bell/phone within reach  OT Visit Diagnosis: Unsteadiness on feet (R26.81);Other abnormalities of gait and mobility (R26.89);Muscle weakness (generalized) (M62.81);Other symptoms and signs involving cognitive function                Time: 3149-7026 OT Time Calculation (min): 31 min Charges:  OT General Charges $OT Visit: 1 Visit OT Evaluation $OT Eval Low Complexity: 1 Low OT Treatments $Self Care/Home Management : 8-22 mins  Juancarlos Crescenzo MSOT, OTR/L Acute Rehab Pager: [phone] Office: [place] 10/28/2018, 4:27 PM

## 2018-10-28 NOTE — ED Notes (Signed)
Bipap removed at pt request

## 2018-10-28 NOTE — Progress Notes (Signed)
Pt unable to tolerate BIPAP. Pt placed back on 4L .

## 2018-10-28 NOTE — ED Triage Notes (Addendum)
Patient c/o SOB w/ productive cough x5-6 days. Hx of intubation and trach. NAD noted at this time - Denies contact with anyone dx with CV-19 nor has she traveled out of the country. Patient was tiven duo-neb by EMS and placed on CPAP which she states helped.

## 2018-10-28 NOTE — ED Notes (Signed)
RT notified re: new orders

## 2018-10-28 NOTE — H&P (Addendum)
History and Physical    Bianca Murray HGD:[id number] DOB: [birth year] DOA: 10/28/2018  PCP: Medicine, [facility]  Patient coming from: Home  I have personally briefly reviewed patient's old medical records in [place]  Chief Complaint: Respiratory distress  HPI: Bianca Murray is a 63 y.o. female with medical history significant of O2 dependent COPD, Asthma.  She has required intubation and tracheostomy in the past (though this has been reversed).  She moved down to the area a few weeks ago from [place].  No known sick contacts.  For the past 4-5 days she has had increased SOB, cough, wheezing.  This has been gradually worsening since onset.  She is on 3-4L O2 at baseline.   ED Course: Given mag, solumedrol, cont nebs, duonebs, and ultimately put on BIPAP.  ABG shows mild acute on a more significant chronic hypercapnic failure.   Review of Systems: As per HPI otherwise 10 point review of systems negative.   Past Medical History:  Diagnosis Date  . Arthritis   . Asthma   . Cancer ([place])   . COPD (chronic obstructive pulmonary disease) ([place])   . Hypertension     Past Surgical History:  Procedure Laterality Date  . ABDOMINAL HYSTERECTOMY    . TRACHEOSTOMY     reversed     reports that she has quit smoking. She has never used smokeless tobacco. She reports previous alcohol use. She reports previous drug use.  No Known Allergies  History reviewed. No pertinent family history. No sick contacts in family.  Prior to Admission medications   Not on File    Physical Exam: Vitals:   10/28/18 0330 10/28/18 0400 10/28/18 0426 10/28/18 0500  BP: 110/73 (!) 115/93  113/73  Pulse: (!) 108 (!) 115  (!) 119  Resp: 14 (!) 22  (!) 22  Temp:      TempSrc:      SpO2: 100% 95% 99% 98%  Weight:      Height:        Constitutional: Patient with anxiety on BIPAP, says she feels claustrophobic due to mask. Eyes: PERRL, lids and conjunctivae normal ENMT: Mucous  membranes are moist. Posterior pharynx clear of any exudate or lesions.Normal dentition.  Neck: normal, supple, no masses, no thyromegaly Respiratory: Diffuse wheezing and diminished breath sounds bilaterally, prolonged expiratory phase. Cardiovascular: Regular rate and rhythm, no murmurs / rubs / gallops. No extremity edema. 2+ pedal pulses. No carotid bruits.  Abdomen: no tenderness, no masses palpated. No hepatosplenomegaly. Bowel sounds positive.  Musculoskeletal: no clubbing / cyanosis. No joint deformity upper and lower extremities. Good ROM, no contractures. Normal muscle tone.  Skin: no rashes, lesions, ulcers. No induration Neurologic: CN 2-12 grossly intact. Sensation intact, DTR normal. Strength 5/5 in all 4.  Psychiatric: Normal judgment and insight. Alert and oriented x 3. Normal mood.   Date: @TODAY @ 5:50 AM  Patient Isolation: Droplet HCP PPE: wearing N95 mask wearing gloves wearing eye protection Patient DQQ:IWLN - on BIPAP   Labs on Admission: I have personally reviewed following labs and imaging studies  CBC: Recent Labs  Lab 10/28/18 0150 10/28/18 0255  WBC 7.1  --   NEUTROABS 3.9  --   HGB 11.9* 11.9*  HCT 38.5 35.0*  MCV 92.3  --   PLT 343  --    Basic Metabolic Panel: Recent Labs  Lab 10/28/18 0150 10/28/18 0255  NA 139 139  K 3.7 3.3*  CL 102  --   CO2 28  --  GLUCOSE 134*  --   BUN 6*  --   CREATININE 0.81  --   CALCIUM 9.4  --    GFR: Estimated Creatinine Clearance: 75.1 mL/min (by C-G formula based on SCr of 0.81 mg/dL). Liver Function Tests: No results for input(s): AST, ALT, ALKPHOS, BILITOT, PROT, ALBUMIN in the last 168 hours. No results for input(s): LIPASE, AMYLASE in the last 168 hours. No results for input(s): AMMONIA in the last 168 hours. Coagulation Profile: No results for input(s): INR, PROTIME in the last 168 hours. Cardiac Enzymes: No results for input(s): CKTOTAL, CKMB, CKMBINDEX, TROPONINI in the last 168 hours. BNP  (last 3 results) No results for input(s): PROBNP in the last 8760 hours. HbA1C: No results for input(s): HGBA1C in the last 72 hours. CBG: No results for input(s): GLUCAP in the last 168 hours. Lipid Profile: No results for input(s): CHOL, HDL, LDLCALC, TRIG, CHOLHDL, LDLDIRECT in the last 72 hours. Thyroid Function Tests: No results for input(s): TSH, T4TOTAL, FREET4, T3FREE, THYROIDAB in the last 72 hours. Anemia Panel: No results for input(s): VITAMINB12, FOLATE, FERRITIN, TIBC, IRON, RETICCTPCT in the last 72 hours. Urine analysis: No results found for: COLORURINE, APPEARANCEUR, LABSPEC, PHURINE, GLUCOSEU, HGBUR, BILIRUBINUR, KETONESUR, PROTEINUR, UROBILINOGEN, NITRITE, LEUKOCYTESUR  Radiological Exams on Admission: Dg Chest Port 1 View  Result Date: 10/28/2018 CLINICAL DATA:  Patient c/o SOB w/ productive cough x5-6 days. Hx of intubation and trach. NAD noted at this time EXAM: PORTABLE CHEST 1 VIEW COMPARISON:  None. FINDINGS: Cardiac silhouette is normal in size. No mediastinal or hilar masses. No evidence of adenopathy. Lungs are hyperexpanded. There is relative lucency in the upper lobes consistent with emphysema. Linear opacities noted in the right mid lung along the minor fissure and left mid lung, consistent with scarring or atelectasis. Lungs are otherwise clear. No pleural effusion or pneumothorax. Skeletal structures are grossly intact. IMPRESSION: 1. No acute cardiopulmonary disease. 2. COPD/emphysema. Electronically Signed   By: Lajean Manes M.D.   On: 10/28/2018 02:38    EKG: Independently reviewed.  Assessment/Plan Principal Problem:   COPD with acute exacerbation (HCC) Active Problems:   Acute on chronic respiratory failure with hypoxia and hypercapnia (HCC)    1. COPD with acute resp failure with hypoxia, also small acute hypercapnic component as well - 1. COPD pathway 2. Rocephin 3. Scheduled LABA/LAMA/ inh steroid 4. Solumedrol for first 24h then prednisone  5. BIPAP 6. Influenza PCR and RVP pending 7. Will need to decide if she does or does not need COVID testing later this AM after flu PCR and RVP come back 8. Getting small dose of 0.5 ativan for the anxiety and claustrophobia with BIPAP.  DVT prophylaxis: Lovenox Code Status: Full Family Communication: No family in room Disposition Plan: Home after admit Consults called: None Admission status: Place in 40    Karman Veney, [place] Hospitalists  How to contact the [facility] Attending or Consulting provider [place] or covering provider during after hours [place], for this patient?  1. Check the care team in [facility] and look for a) attending/consulting [facility] provider listed and b) the [facility] team listed 2. Log into [email or web address]  Amion Physician Scheduling and messaging for groups and whole hospitals  On call and physician scheduling software for group practices, residents, hospitalists and other medical providers for call, clinic, rotation and shift schedules. On[employer] is a hospital-wide system for scheduling doctors and paging doctors on call. EasyPlot is for scientific plotting and data analysis.  [email or web address]  and use [place]'s universal password to access. If you do not have the password, please contact the hospital operator.  3. Locate the [facility] provider you are looking for under [facility] and page to a number that you can be directly reached. 4. If you still have difficulty reaching the provider, please page the [facility] (Director on Call) for the Hospitalists listed on amion for assistance.  10/28/2018, 5:10 AM

## 2018-10-28 NOTE — ED Notes (Signed)
ED TO INPATIENT HANDOFF REPORT  ED Nurse Name and Phone #: Otila Kluver [phone]  S Name/Age/Gender Bianca Murray 63 y.o. female Room/Bed: 034C/034C  Code Status   Code Status: Full Code  Home/SNF/Other Home Patient oriented to: self, place, time and situation Is this baseline? Yes   Triage Complete: Triage complete  Chief Complaint productive cough; cpap   Triage Note Patient c/o SOB w/ productive cough x5-6 days. Hx of intubation and trach. NAD noted at this time - Denies contact with anyone dx with CV-19 nor has she traveled out of the country. Patient was tiven duo-neb by EMS and placed on CPAP which she states helped.   Allergies No Known Allergies  Level of Care/Admitting Diagnosis ED Disposition    ED Disposition Condition [facility] Area: [place] [[id number]]  Level of Care: Progressive [102]  I expect the patient will be discharged within 24 hours: No (not a candidate for 5C-Observation unit)  Diagnosis: COPD with acute exacerbation [facility]) [[id number]]  Admitting Physician: Etta Quill [phone]  Attending Physician: Etta Quill [[id number]]  PT Class (Do Not Modify): Observation [104]  PT Acc Code (Do Not Modify): Observation [10022]       B Medical/Surgery History Past Medical History:  Diagnosis Date  . Arthritis   . Asthma   . Cancer ([place])   . COPD (chronic obstructive pulmonary disease) ([place])   . Hypertension    Past Surgical History:  Procedure Laterality Date  . ABDOMINAL HYSTERECTOMY    . TRACHEOSTOMY     reversed     A IV Location/Drains/Wounds Patient Lines/Drains/Airways Status   Active Line/Drains/Airways    Name:   Placement date:   Placement time:   Site:   Days:   Peripheral IV 10/28/18 Forearm   10/28/18    0202    Forearm   less than 1          Intake/Output Last 24 hours No intake or output data in the 24 hours ending 10/28/18 0442  Labs/Imaging Results for orders placed or performed during the  hospital encounter of 10/28/18 (from the past 48 hour(s))  CBC with Differential/Platelet     Status: Abnormal   Collection Time: 10/28/18  1:50 AM  Result Value Ref Range   WBC 7.1 4.0 - 10.5 K/uL   RBC 4.17 3.87 - 5.11 MIL/uL   Hemoglobin 11.9 (L) 12.0 - 15.0 g/dL   HCT 38.5 36.0 - 46.0 %   MCV 92.3 80.0 - 100.0 fL   MCH 28.5 26.0 - 34.0 pg   MCHC 30.9 30.0 - 36.0 g/dL   RDW 13.9 11.5 - 15.5 %   Platelets 343 150 - 400 K/uL   nRBC 0.0 0.0 - 0.2 %   Neutrophils Relative % 55 %   Neutro Abs 3.9 1.7 - 7.7 K/uL   Lymphocytes Relative 24 %   Lymphs Abs 1.7 0.7 - 4.0 K/uL   Monocytes Relative 14 %   Monocytes Absolute 1.0 0.1 - 1.0 K/uL   Eosinophils Relative 6 %   Eosinophils Absolute 0.4 0.0 - 0.5 K/uL   Basophils Relative 1 %   Basophils Absolute 0.1 0.0 - 0.1 K/uL   Immature Granulocytes 0 %   Abs Immature Granulocytes 0.02 0.00 - 0.07 K/uL    Comment: Performed at [facility], [address], [place], [place] [address]  Basic metabolic panel     Status: Abnormal   Collection Time: 10/28/18  1:50 AM  Result Value Ref Range   Sodium 139 135 - 145 mmol/L   Potassium 3.7 3.5 - 5.1 mmol/L   Chloride 102 98 - 111 mmol/L   CO2 28 22 - 32 mmol/L   Glucose, Bld 134 (H) 70 - 99 mg/dL   BUN 6 (L) 8 - 23 mg/dL   Creatinine, Ser 0.81 0.44 - 1.00 mg/dL   Calcium 9.4 8.9 - 10.3 mg/dL   GFR calc non Af Amer >60 >60 mL/min   GFR calc Af Amer >60 >60 mL/min   Anion gap 9 5 - 15    Comment: Performed at [facility] [address] [place], [place] [address]  I-STAT 7, (LYTES, BLD GAS, ICA, H+H)     Status: Abnormal   Collection Time: 10/28/18  2:55 AM  Result Value Ref Range   pH, Arterial 7.320 (L) 7.350 - 7.450   pCO2 arterial 62.8 (H) 32.0 - 48.0 mmHg   pO2, Arterial 196.0 (H) 83.0 - 108.0 mmHg   Bicarbonate 32.3 (H) 20.0 - 28.0 mmol/L   TCO2 34 (H) 22 - 32 mmol/L   O2 Saturation 100.0 %   Acid-Base Excess 5.0 (H) 0.0 - 2.0 mmol/L   Sodium 139 135 - 145 mmol/L    Potassium 3.3 (L) 3.5 - 5.1 mmol/L   Calcium, Ion 1.25 1.15 - 1.40 mmol/L   HCT 35.0 (L) 36.0 - 46.0 %   Hemoglobin 11.9 (L) 12.0 - 15.0 g/dL   Patient temperature 98.6 F    Collection site RADIAL, ALLEN'S TEST ACCEPTABLE    Drawn by Operator    Sample type ARTERIAL    Dg Chest Port 1 View  Result Date: 10/28/2018 CLINICAL DATA:  Patient c/o SOB w/ productive cough x5-6 days. Hx of intubation and trach. NAD noted at this time EXAM: PORTABLE CHEST 1 VIEW COMPARISON:  None. FINDINGS: Cardiac silhouette is normal in size. No mediastinal or hilar masses. No evidence of adenopathy. Lungs are hyperexpanded. There is relative lucency in the upper lobes consistent with emphysema. Linear opacities noted in the right mid lung along the minor fissure and left mid lung, consistent with scarring or atelectasis. Lungs are otherwise clear. No pleural effusion or pneumothorax. Skeletal structures are grossly intact. IMPRESSION: 1. No acute cardiopulmonary disease. 2. COPD/emphysema. Electronically Signed   By: Lajean Manes M.D.   On: 10/28/2018 02:38    Pending Labs Unresulted Labs (From admission, onward)    Start     Ordered   10/28/18 0425  HIV antibody (Routine Testing)  Once,   R     10/28/18 0427   10/28/18 0222  Blood gas, arterial  ONCE - STAT,   R     10/28/18 0221          Vitals/Pain Today's Vitals   10/28/18 0240 10/28/18 0330 10/28/18 0400 10/28/18 0437  BP:  110/73 (!) 115/93   Pulse:  (!) 108 (!) 115   Resp:  14 (!) 22   Temp:      TempSrc:      SpO2: 100% 100% 95%   Weight:      Height:      PainSc:    Asleep    Isolation Precautions No active isolations  Medications Medications  albuterol (PROVENTIL,VENTOLIN) solution continuous neb (0 mg/hr Nebulization Stopped 10/28/18 0344)  albuterol (PROVENTIL,VENTOLIN) solution continuous neb (10 mg/hr Nebulization New Bag/Given 10/28/18 0426)  enoxaparin (LOVENOX) injection 40 mg (has no administration in time range)   cefTRIAXone (ROCEPHIN) 1 g  in sodium chloride 0.9 % 100 mL IVPB (has no administration in time range)  methylPREDNISolone sodium succinate (SOLU-MEDROL) 125 mg/2 mL injection 60 mg (has no administration in time range)    Followed by  predniSONE (DELTASONE) tablet 40 mg (has no administration in time range)  mometasone-formoterol (DULERA) 200-5 MCG/ACT inhaler 1 puff (has no administration in time range)  levalbuterol (XOPENEX) nebulizer solution 0.63 mg (has no administration in time range)  umeclidinium bromide (INCRUSE ELLIPTA) 62.5 MCG/INH 1 puff (has no administration in time range)  magnesium sulfate IVPB 2 g 50 mL (0 g Intravenous Stopped 10/28/18 0344)  ipratropium (ATROVENT) nebulizer solution 0.5 mg (0.5 mg Nebulization Given 10/28/18 0239)  methylPREDNISolone sodium succinate (SOLU-MEDROL) 125 mg/2 mL injection 125 mg (125 mg Intravenous Given 10/28/18 0244)  fentaNYL (SUBLIMAZE) injection 50 mcg (50 mcg Intravenous Given 10/28/18 0412)  acetaminophen (TYLENOL) tablet 1,000 mg (1,000 mg Oral Given 10/28/18 0412)    Mobility walks Low fall risk   Focused Assessments Cardiac Assessment Handoff:  Cardiac Rhythm: Sinus tachycardia No results found for: CKTOTAL, CKMB, CKMBINDEX, TROPONINI No results found for: DDIMER Does the Patient currently have chest pain? No   , Pulmonary Assessment Handoff:  Lung sounds: Bilateral Breath Sounds: Diminished L Breath Sounds: (sound audibly congested) R Breath Sounds: (sound audibly congested) O2 Device: Bi-PAP O2 Flow Rate (L/min): 4 L/min      R Recommendations: See Admitting Provider Note  Report given to:   Additional Notes:  Previous hx of being intubated and trach'd. Pt is very anxious. Md aware.

## 2018-10-28 NOTE — Progress Notes (Signed)
Initial Nutrition Assessment  DOCUMENTATION CODES:   Obesity unspecified  INTERVENTION:   Continue heart healthy/carb modified diet.     NUTRITION DIAGNOSIS:   Increased nutrient needs related to (COPD exacerbation) as evidenced by estimated needs.   GOAL:   Patient will meet greater than or equal to 90% of their needs   MONITOR:   PO intake  REASON FOR ASSESSMENT:   Consult COPD Protocol  ASSESSMENT:   63 y.o. pt with PMH of COPD, HTN, and asthma presents with acute exacerbation of COPD.   Due to isolation precaution assessment was performed virtually via calling room phone.    Pt reports experiencing decrease in appetite about one week prior to admittance due to lethargy and increased SOB.  Pt appetite has improved with PO intake of 100% at breakfast.  Pt reports ordering breakfast and dinner through room service, but did not get to order lunch. Encouraged pt to continue to call in orders for all meals.   Prior diet history reveals pt usually eats well.  Breakfast usually includes sausage and breakfast potatoes, waffle and eggs, or grits and eggs; typical lunch or dinner of shrimp and fries or fish with corn on the cobb and pasta salad.  Pt currently lives with daughter and daughter prepares all meals.  Pt daughter reports that she fries her own food but prepares food for pt in air fryer.  No recent weight changes per pt report.    Labs reviewed: potassium 3.3 (L), glucose 134 (H) Medications reviewed and include: prednisone, NS @ 57ml/hr   Suspect pt is meeting nutrient needs with current oral intake.    NUTRITION - FOCUSED PHYSICAL EXAM:     Most Recent Value  Orbital Region  Unable to assess [due to droplet precaution]  Upper Arm Region  Unable to assess [due to droplet precaution]  Thoracic and Lumbar Region  Unable to assess [due to droplet precaution]  Buccal Region  Unable to assess [due to droplet precaution]  Temple Region  Unable to assess [due to  droplet precaution]  Clavicle Bone Region  Unable to assess [due to droplet precaution]  Clavicle and Acromion Bone Region  Unable to assess [due to droplet precaution]  Scapular Bone Region  Unable to assess  Dorsal Hand  Unable to assess [due to droplet precaution]  Patellar Region  Unable to assess [due to droplet precaution]  Anterior Thigh Region  Unable to assess [due to droplet precaution]  Posterior Calf Region  Unable to assess [due to droplet precaution]  Edema (RD Assessment)  Unable to assess [due to droplet precaution]  Hair  Unable to assess [due to droplet precaution]  Eyes  Unable to assess [due to droplet precaution]  Mouth  (S) Unable to assess [due to droplet precaution]  Skin  Unable to assess [due to droplet precaution]  Nails  Unable to assess [due to droplet precaution]       Diet Order:   Diet Order            Diet heart healthy/carb modified Room service appropriate? Yes; Fluid consistency: Thin  Diet effective now              EDUCATION NEEDS:   Not appropriate for education at this time  Skin:  Skin Assessment: Reviewed RN Assessment  Last BM:  prior to admission  Height:   Ht Readings from Last 1 Encounters:  10/28/18 5\' 2"  (1.575 m)    Weight:   Wt Readings from Last 1 Encounters:  10/28/18 92.1 kg    Ideal Body Weight:  50 kg  BMI:  Body mass index is 37.13 kg/m.  Estimated Nutritional Needs:   Kcal:  1700-1900 kcal  Protein:  95-115g  Fluid:  2 L   Jethro Bastos, Dietetic Intern

## 2018-10-28 NOTE — Progress Notes (Signed)
PROGRESS NOTE    Bianca Murray  HWE:[id number] DOB: October 04, 1955 DOA: 10/28/2018 PCP: Medicine, [facility]    Brief Narrative: 63 year old with past medical history significant for COPD oxygen dependent 3 to 4 L, asthma, prior tracheostomy (now reversed), she moved from [place] a few weeks ago.  She denies any sick contact.  She presents complaining of worsening shortness of breath, cough and wheezing.  She denies any fever.  Assessment & Plan:   Principal Problem:   COPD with acute exacerbation ([place]) Active Problems:   Acute on chronic respiratory failure with hypoxia and hypercapnia (HCC)   COPD (chronic obstructive pulmonary disease) (HCC)  1-Acute COPD exacerbation: Acute on chronic hypoxic hypercapnic respiratory failure.  Patient with  hypercapnia, hypoxemia worsening shortness of breath, worse cough. Chest x-ray was negative for pneumonia. Influenza and RVP negative. She is afebrile, chest x-ray is negative.  She will be treated for COPD exacerbation.  If she is spike fever could consider testing for cough with 19. Will repeat chest x-ray tomorrow. She was not able to tolerate BiPAP. Continue with schedule IV/LMA. Will order IV Solu-Medrol every 8 hours. Stop ceftriaxone.  Started doxycycline. Droplet and contact precaution.  2-Hypertension Hold Norvasc and lisinopril.  Systolic blood pressure soft.   3 Diarrhea, abdominal pain. She reports 2-3 episode of diarrhea at home. Check for GI pathogen  4-Hypokalemia: Replete orally.  Estimated body mass index is 37.13 kg/m as calculated from the following:   Height as of this encounter: 5\' 2"  (1.575 m).   Weight as of this encounter: 92.1 kg.   DVT prophylaxis: Lovenox Code Status: Full code Family Communication: Care discussed with patient Disposition Plan: Remain in the hospital for treatment of acute on chronic hypoxic hypercapnic respiratory failure, COPD exacerbation.  Consultants:   None    Procedures:   None  Antimicrobials: Doxycycline  Subjective: She is still complaining of shortness of breath, she is also complaining of abdominal pain especially after she coughed.  She reports diarrhea 2-3 episode at home.  Objective: Vitals:   10/28/18 0426 10/28/18 0500 10/28/18 0530 10/28/18 0715  BP:  113/73 91/63   Pulse:  (!) 119 (!) 115 100  Resp:  (!) 22 (!) 22 20  Temp:      TempSrc:      SpO2: 99% 98% 96% 96%  Weight:      Height:       No intake or output data in the 24 hours ending 10/28/18 0800 Filed Weights   10/28/18 0156  Weight: 92.1 kg    Examination:  General exam: Appears calm and comfortable  Respiratory system: Bilateral decreased breath sounds bilateral wheezing Cardiovascular system: S1 & S2 heard, RRR. No JVD, murmurs, rubs, gallops or clicks. No pedal edema. Gastrointestinal system: Abdomen is nondistended, soft and nontender. No organomegaly or masses felt. Normal bowel sounds heard. Central nervous system: Alert and oriented. No focal neurological deficits. Extremities: Symmetric 5 x 5 power. Skin: No rashes, lesions or ulcers Psychiatry: Judgement and insight appear normal. Mood & affect appropriate.     Data Reviewed: I have personally reviewed following labs and imaging studies  CBC: Recent Labs  Lab 10/28/18 0150 10/28/18 0255  WBC 7.1  --   NEUTROABS 3.9  --   HGB 11.9* 11.9*  HCT 38.5 35.0*  MCV 92.3  --   PLT 343  --    Basic Metabolic Panel: Recent Labs  Lab 10/28/18 0150 10/28/18 0255  NA 139 139  K 3.7 3.3*  CL 102  --   CO2 28  --   GLUCOSE 134*  --   BUN 6*  --   CREATININE 0.81  --   CALCIUM 9.4  --    GFR: Estimated Creatinine Clearance: 75.1 mL/min (by C-G formula based on SCr of 0.81 mg/dL). Liver Function Tests: No results for input(s): AST, ALT, ALKPHOS, BILITOT, PROT, ALBUMIN in the last 168 hours. No results for input(s): LIPASE, AMYLASE in the last 168 hours. No results for input(s): AMMONIA  in the last 168 hours. Coagulation Profile: No results for input(s): INR, PROTIME in the last 168 hours. Cardiac Enzymes: No results for input(s): CKTOTAL, CKMB, CKMBINDEX, TROPONINI in the last 168 hours. BNP (last 3 results) No results for input(s): PROBNP in the last 8760 hours. HbA1C: No results for input(s): HGBA1C in the last 72 hours. CBG: No results for input(s): GLUCAP in the last 168 hours. Lipid Profile: No results for input(s): CHOL, HDL, LDLCALC, TRIG, CHOLHDL, LDLDIRECT in the last 72 hours. Thyroid Function Tests: No results for input(s): TSH, T4TOTAL, FREET4, T3FREE, THYROIDAB in the last 72 hours. Anemia Panel: No results for input(s): VITAMINB12, FOLATE, FERRITIN, TIBC, IRON, RETICCTPCT in the last 72 hours. Sepsis Labs: No results for input(s): PROCALCITON, LATICACIDVEN in the last 168 hours.  No results found for this or any previous visit (from the past 240 hour(s)).       Radiology Studies: Dg Chest Port 1 View  Result Date: 10/28/2018 CLINICAL DATA:  Patient c/o SOB w/ productive cough x5-6 days. Hx of intubation and trach. NAD noted at this time EXAM: PORTABLE CHEST 1 VIEW COMPARISON:  None. FINDINGS: Cardiac silhouette is normal in size. No mediastinal or hilar masses. No evidence of adenopathy. Lungs are hyperexpanded. There is relative lucency in the upper lobes consistent with emphysema. Linear opacities noted in the right mid lung along the minor fissure and left mid lung, consistent with scarring or atelectasis. Lungs are otherwise clear. No pleural effusion or pneumothorax. Skeletal structures are grossly intact. IMPRESSION: 1. No acute cardiopulmonary disease. 2. COPD/emphysema. Electronically Signed   By: Lajean Manes M.D.   On: 10/28/2018 02:38        Scheduled Meds: . doxycycline  100 mg Oral Q12H  . enoxaparin (LOVENOX) injection  40 mg Subcutaneous Daily  . levalbuterol  0.63 mg Nebulization Q6H  . methylPREDNISolone (SOLU-MEDROL)  injection  60 mg Intravenous Q6H  . mometasone-formoterol  1 puff Inhalation BID  . potassium chloride  40 mEq Oral Once  . umeclidinium bromide  1 puff Inhalation Daily   Continuous Infusions: . sodium chloride    . albuterol Stopped (10/28/18 0344)  . albuterol 10 mg/hr (10/28/18 0426)  . cefTRIAXone (ROCEPHIN)  IV       LOS: 0 days    Time spent: 35 minutes.    Elmarie Shiley, MD [facility] Pager [phone]  If 7PM-7AM, please contact night-coverage [email or web address] Password [email or web address] 10/28/2018, 8:00 AM

## 2018-10-29 ENCOUNTER — Inpatient Hospital Stay (HOSPITAL_COMMUNITY): Payer: Medicaid Other

## 2018-10-29 DIAGNOSIS — J9621 Acute and chronic respiratory failure with hypoxia: Principal | ICD-10-CM

## 2018-10-29 DIAGNOSIS — J9622 Acute and chronic respiratory failure with hypercapnia: Secondary | ICD-10-CM

## 2018-10-29 MED ORDER — METHYLPREDNISOLONE SODIUM SUCC 125 MG IJ SOLR
60.0000 mg | Freq: Three times a day (TID) | INTRAMUSCULAR | Status: DC
  Administered 2018-10-29 – 2018-10-30 (×4): 60 mg via INTRAVENOUS
  Filled 2018-10-29 (×4): qty 2

## 2018-10-29 MED ORDER — LEVALBUTEROL HCL 0.63 MG/3ML IN NEBU: 0.6300 mg | INHALATION_SOLUTION | Freq: Four times a day (QID) | RESPIRATORY_TRACT | Status: DC | PRN

## 2018-10-29 MED ORDER — GUAIFENESIN ER 600 MG PO TB12
1200.0000 mg | ORAL_TABLET | Freq: Two times a day (BID) | ORAL | Status: DC
  Administered 2018-10-29 – 2018-11-02 (×8): 1200 mg via ORAL
  Filled 2018-10-29 (×8): qty 2

## 2018-10-29 MED ORDER — BENZONATATE 100 MG PO CAPS
200.0000 mg | ORAL_CAPSULE | Freq: Three times a day (TID) | ORAL | Status: DC
  Administered 2018-10-29 – 2018-11-05 (×21): 200 mg via ORAL
  Filled 2018-10-29 (×21): qty 2

## 2018-10-29 NOTE — Progress Notes (Addendum)
PROGRESS NOTE   Bianca Murray  NIO:[id number]    DOB: November 23, 1955    DOA: 10/28/2018  PCP: Medicine, [facility]   I have briefly reviewed patients previous medical [facility] in [facility].  Brief Narrative:  63 year old female, recently moved from [place], [place] to [facility] in February 2020, lives with her daughter and grandkids, independent, PMH of COPD/asthma, chronic hypoxic respiratory failure on home oxygen 3-4 L/min for the last 3 to 4 years, prior history of intubation/mechanical ventilation, reversed tracheostomy, former smoker quit 2 years ago, HTN, presented with productive cough, progressively worsening wheezing and dyspnea.  No recent travel to obvious areas with Covid 19 or contact with known Covid 19 positive case.  Admitted for COPD exacerbation and acute on chronic hypoxic respiratory failure.   Assessment & Plan:   Principal Problem:   COPD with acute exacerbation ([place]) Active Problems:   Acute on chronic respiratory failure with hypoxia and hypercapnia (HCC)   COPD (chronic obstructive pulmonary disease) ([place])   1. COPD exacerbation: ABG 3/17: pH 7.32, PCO2 63, PO2 196, bicarbonate 33 and oxygen saturation 100%.  Influenza panel PCR, RSV panel, HIV screen: Negative.  No leukocytosis.  LDH and LFTs normal.  Chest x-ray 3/18 personally reviewed: No acute abnormality seen.  Patient unable to tolerate BiPAP on admission.  Treating with IV Solu-Medrol 60 mg every 6 hourly, doxycycline, Dulera, umeclidinium, bronchodilator nebulizations.  Added flutter valve and supportive treatment for cough.  On droplet isolation.  Slowly improving.  DC IV fluids. 2. Acute on chronic hypoxic and hypercapnic respiratory failure: Secondary to COPD exacerbation.  Management as indicated above.  Titrate oxygen to maintain oxygen saturation between 89-92%. 3. Essential hypertension: Amlodipine and lisinopril temporarily held due to soft blood pressures.  Monitor and resume when able.  4. Diarrhea/abdominal pain: Present on admission but seems to have resolved.  Monitor. 5. Hypokalemia: Replaced. 6. Normocytic anemia: May be chronic disease.  No prior labs to compare.  Outpatient follow-up. 7. Obesity/Body mass index is 37.13 kg/m.    DVT prophylaxis: Lovenox Code Status: Full Family Communication: I discussed with patient's daughter, updated care and answered questions. Disposition: DC home pending clinical improvement which may take another 2 to 3 days.   Consultants:  None  Procedures:  None  Antimicrobials:  IV ceftriaxone-discontinued. Doxycycline   Subjective: Ongoing productive cough, wants something to help her cough.  No chest pain or fevers.  Dyspnea and wheezing slightly better.  ROS: Not a current smoker.  Reportedly followed with pulmonology in [place] prior to moving to [facility].  Objective:  Vitals:   10/29/18 0101 10/29/18 0403 10/29/18 0809 10/29/18 0817  BP:  113/82  (!) 145/88  Pulse:  95    Resp:  18    Temp:  98.2 F (36.8 C)  98 F (36.7 C)  TempSrc:  Oral  Oral  SpO2: 97% 99% 96%   Weight:      Height:        Examination:  General exam: Pleasant middle-aged female, moderately built and obese, sitting up comfortably in bed with intermittent cough. Respiratory system: Slightly harsh and diminished breath sounds bilaterally with few posterior expiratory rhonchi but no crackles. Respiratory effort normal. Cardiovascular system: S1 & S2 heard, RRR. No JVD, murmurs, rubs, gallops or clicks. No pedal edema.  Telemetry personally reviewed: SR-ST in the 110s. Gastrointestinal system: Abdomen is nondistended, soft and nontender. No organomegaly or masses felt. Normal bowel sounds heard. Central nervous system: Alert and oriented. No focal  neurological deficits. Extremities: Symmetric 5 x 5 power. Skin: No rashes, lesions or ulcers Psychiatry: Judgement and insight appear normal. Mood & affect appropriate.     Data Reviewed: I  have personally reviewed following labs and imaging studies  CBC: Recent Labs  Lab 10/28/18 0150 10/28/18 0255  WBC 7.1  --   NEUTROABS 3.9  --   HGB 11.9* 11.9*  HCT 38.5 35.0*  MCV 92.3  --   PLT 343  --    Basic Metabolic Panel: Recent Labs  Lab 10/28/18 0150 10/28/18 0255  NA 139 139  K 3.7 3.3*  CL 102  --   CO2 28  --   GLUCOSE 134*  --   BUN 6*  --   CREATININE 0.81  --   CALCIUM 9.4  --    Liver Function Tests: Recent Labs  Lab 10/28/18 1255  AST 17  ALT 10  ALKPHOS 70  BILITOT 0.3  PROT 6.9  ALBUMIN 3.5     Recent Results (from the past 240 hour(s))  Respiratory Panel by PCR     Status: None   Collection Time: 10/28/18  4:14 AM  Result Value Ref Range Status   Adenovirus NOT DETECTED NOT DETECTED Final   Coronavirus 229E NOT DETECTED NOT DETECTED Final    Comment: (NOTE) The Coronavirus on the Respiratory Panel, DOES NOT test for the novel  Coronavirus (2019 nCoV)    Coronavirus HKU1 NOT DETECTED NOT DETECTED Final   Coronavirus NL63 NOT DETECTED NOT DETECTED Final   Coronavirus OC43 NOT DETECTED NOT DETECTED Final   Metapneumovirus NOT DETECTED NOT DETECTED Final   Rhinovirus / Enterovirus NOT DETECTED NOT DETECTED Final   Influenza A NOT DETECTED NOT DETECTED Final   Influenza B NOT DETECTED NOT DETECTED Final   Parainfluenza Virus 1 NOT DETECTED NOT DETECTED Final   Parainfluenza Virus 2 NOT DETECTED NOT DETECTED Final   Parainfluenza Virus 3 NOT DETECTED NOT DETECTED Final   Parainfluenza Virus 4 NOT DETECTED NOT DETECTED Final   Respiratory Syncytial Virus NOT DETECTED NOT DETECTED Final   Bordetella pertussis NOT DETECTED NOT DETECTED Final   Chlamydophila pneumoniae NOT DETECTED NOT DETECTED Final   Mycoplasma pneumoniae NOT DETECTED NOT DETECTED Final    Comment: Performed at [facility], [place]. [address] [place], [place] [address]         Radiology Studies: Dg Chest 2 View  Result Date: 10/29/2018 CLINICAL DATA:   Cough and shortness of breath for 2 days EXAM: CHEST - 2 VIEW COMPARISON:  10/22/2018 FINDINGS: Cardiac shadow is stable when compared with the previous day. The lungs are well aerated without focal infiltrate or sizable effusion. No bony abnormality is noted. IMPRESSION: No acute abnormality seen. Electronically Signed   By: Inez Catalina M.D.   On: 10/29/2018 09:05   Dg Chest Port 1 View  Result Date: 10/28/2018 CLINICAL DATA:  Patient c/o SOB w/ productive cough x5-6 days. Hx of intubation and trach. NAD noted at this time EXAM: PORTABLE CHEST 1 VIEW COMPARISON:  None. FINDINGS: Cardiac silhouette is normal in size. No mediastinal or hilar masses. No evidence of adenopathy. Lungs are hyperexpanded. There is relative lucency in the upper lobes consistent with emphysema. Linear opacities noted in the right mid lung along the minor fissure and left mid lung, consistent with scarring or atelectasis. Lungs are otherwise clear. No pleural effusion or pneumothorax. Skeletal structures are grossly intact. IMPRESSION: 1. No acute cardiopulmonary disease. 2. COPD/emphysema. Electronically Signed  By: Lajean Manes M.D.   On: 10/28/2018 02:38        Scheduled Meds: . doxycycline  100 mg Oral Q12H  . enoxaparin (LOVENOX) injection  40 mg Subcutaneous Daily  . guaiFENesin  600 mg Oral BID  . levalbuterol  0.63 mg Nebulization Q6H  . mouth rinse  15 mL Mouth Rinse BID  . methylPREDNISolone (SOLU-MEDROL) injection  60 mg Intravenous Q6H  . mometasone-formoterol  1 puff Inhalation BID  . umeclidinium bromide  1 puff Inhalation Daily   Continuous Infusions: . sodium chloride 75 mL/hr at 10/28/18 2204     LOS: 1 day     Vernell Leep, MD, FACP, [facility]. [facility]  To contact the attending provider between 7A-7P or the covering provider during after hours 7P-7A, please log into the web site [email or web address] and access using universal [place] password for that web site. If you do not have the  password, please call the hospital operator.  10/29/2018, 1:33 PM

## 2018-10-29 NOTE — TOC Initial Note (Addendum)
Transition of Care [facility]) - Initial/Assessment Note    Patient Details  Name: Bianca Murray MRN: [id number] Date of Birth: 1956/02/17  [facility] CM/SW Contact:    Carles Collet, RN Phone Number: 10/29/2018, 11:47 AM  Clinical Narrative:                  Consult COPD Spoke w patient over the phone. She states that she lives at home w her daughter Bianca Murray [phone]) and grandchild. She states that all meds are provided through [facility], she denies barriers getting meds She states her daughter provides transport for her. She states that she has DME O2 but would prefer that CM speak with her daughter for additional info. LM w [name] requesting callback, awaiting to hear back from her.     Addendum: Spoke w patient's daughter. She states that her mother has home oxygen concentrator that works from a [employer] they brought down with them,but it it is unable to fill a portable tank. They also do not have back up tank. Patient used 3-4 L daily at home prior to admission. Patient needs qualifying saturation note and new home oxygen order for referral to [facility] for new set up of home oxygen.  Patient's daughter would also prefer for patient to continue with cardiopulmonary rehab that she had started before she moved. She preferred this instead of HH.  Patient's daughter confirmed that her PCP will be [facility]. They had first appointment scheduled for this week, but are admitted and will need to reschedule. Daughter would like referral to pulmonologist.   Expected Discharge Plan: Home w [place] Barriers to Discharge: Continued Medical Work up   Patient Goals and CMS Choice        Expected Discharge Plan and Services Expected Discharge Plan: [place]                                Prior Living Arrangements/Services                       Activities of Daily Living Home Assistive Devices/Equipment: None ADL Screening  (condition at time of admission) Patient's cognitive ability adequate to safely complete daily activities?: Yes Is the patient deaf or have difficulty hearing?: No Does the patient have difficulty seeing, even when wearing glasses/contacts?: No Does the patient have difficulty concentrating, remembering, or making decisions?: No Patient able to express need for assistance with ADLs?: Yes Does the patient have difficulty dressing or bathing?: No Independently performs ADLs?: Yes (appropriate for developmental age) Does the patient have difficulty walking or climbing stairs?: Yes Weakness of Legs: Both Weakness of Arms/Hands: None  Permission Sought/Granted                  Emotional Assessment              Admission diagnosis:  Cough [R05] Respiratory acidosis [E87.2] SOB (shortness of breath) [R06.02] COPD exacerbation (HCC) [J44.1] COPD (chronic obstructive pulmonary disease) ([place]) [J44.9] Patient Active Problem List   Diagnosis Date Noted  . COPD with acute exacerbation ([place]) 10/28/2018  . Acute on chronic respiratory failure with hypoxia and hypercapnia ([place]) 10/28/2018  . COPD (chronic obstructive pulmonary disease) ([place]) 10/28/2018   PCP:  Medicine, [facility] Pharm[facility]:   [facility] [phone] - [place], [place]  [address] [place] [address] Phone: [phone] Fax: [phone]  [facility] [id number] - [place], [place] - [address] [place] [address] Phone: [phone] Fax: [phone]     Social Determinants of Health (SDOH) Interventions    Readmission Risk Interventions 30 Day Unplanned Readmission Risk Score     ED to Hosp-Admission (Current) from 10/28/2018 in [facility] 4E CV SURGICAL PROGRESSIVE CARE  30 Day Unplanned Readmission Risk Score (%)  9 Filed at 10/29/2018 0801     This score is the patient's risk of an unplanned  readmission within 30 days of being discharged (0 -100%). The score is based on dignosis, age, lab data, medications, orders, and past utilization.   Low:  0-14.9   Medium: 15-21.9   High: 22-29.9   Extreme: 30 and above       No flowsheet data found.

## 2018-10-29 NOTE — Progress Notes (Signed)
Pt refused Bipap at this time. States that she does not need it nor does she want it. Pt with a history of intubation with need for tracheostomy which has since been reversed. Pt currently wears 3-4L oxygen at home.

## 2018-10-30 MED ORDER — METHYLPREDNISOLONE SODIUM SUCC 125 MG IJ SOLR
60.0000 mg | Freq: Two times a day (BID) | INTRAMUSCULAR | Status: DC
  Administered 2018-10-30 – 2018-11-02 (×6): 60 mg via INTRAVENOUS
  Filled 2018-10-30 (×6): qty 2

## 2018-10-30 MED ORDER — LEVALBUTEROL HCL 0.63 MG/3ML IN NEBU
0.6300 mg | INHALATION_SOLUTION | Freq: Three times a day (TID) | RESPIRATORY_TRACT | Status: DC
  Administered 2018-10-30 – 2018-11-01 (×7): 0.63 mg via RESPIRATORY_TRACT
  Filled 2018-10-30 (×7): qty 3

## 2018-10-30 NOTE — Progress Notes (Signed)
Physical Therapy Treatment Patient Details Name: Bianca Murray MRN: [id number] DOB: 07-07-1956 Today's Date: 10/30/2018    History of Present Illness 63 y/o female admitted secondary to worsening SOB likely from COPD exacerbation. PMH includes COPD on home O2, asthma, HTN, cancer, and tracheostomy s/p reversal.     PT Comments    Pt was seen for mobility of gait with O2 on at 3L and then to remove it standing bedside, taking a few sidesteps with PT but afraid of being off O2.  As she declined standing and stepping on room air, pt asked to go back on air at 91%.  Pulse however had gone up to 127 during the effort.  Her plan is to persist with standing endurance and strengthening as tolerated and monitor her vitals to avoid overstressing her system.     Follow Up Recommendations  Home health PT;Supervision for mobility/OOB     Equipment Recommendations  3in1 (PT);Other (comment)    Recommendations for Other Services       Precautions / Restrictions Precautions Precautions: Fall Precaution Comments: O2 at all times Restrictions Weight Bearing Restrictions: No    Mobility  Bed Mobility Overal bed mobility: Modified Independent                Transfers Overall transfer level: Needs assistance Equipment used: None Transfers: Sit to/from Stand Sit to Stand: Supervision         General transfer comment: pt reaches for support of nearby furniture or bed  Ambulation/Gait Ambulation/Gait assistance: Min guard Gait Distance (Feet): 90 Feet(30 x 3 with O2) Assistive device: None Gait Pattern/deviations: Step-through pattern;Decreased stride length Gait velocity: Decreased  Gait velocity interpretation: <1.8 ft/sec, indicate of risk for recurrent falls General Gait Details: pt was able to walk with support of O2 line, and monitoring O2 sats as machine offers.   Stairs             Wheelchair Mobility    Modified Rankin (Stroke Patients Only)       Balance  Overall balance assessment: Needs assistance Sitting-balance support: Feet supported Sitting balance-Leahy Scale: Good     Standing balance support: Single extremity supported;During functional activity Standing balance-Leahy Scale: Fair                              Cognition Arousal/Alertness: Awake/alert Behavior During Therapy: WFL for tasks assessed/performed Overall Cognitive Status: Within Functional Limits for tasks assessed                                        Exercises General Exercises - Lower Extremity Ankle Circles/Pumps: AROM;Both;5 reps Quad Sets: AROM;Both;5 reps Heel Slides: AROM;Both;10 reps Hip ABduction/ADduction: AROM;Both;20 reps    General Comments General comments (skin integrity, edema, etc.): pt had difficulty getting O2 reading on cannula, but once set was at 97%.  Tried to stand with no O2 and at 91% with a couple sidesteps pt became very panicked      Pertinent Vitals/Pain Pain Assessment: No/denies pain    Home Living                      Prior Function            PT Goals (current goals can now be found in the care plan section) Acute Rehab PT Goals Patient Stated Goal: to  go home  Progress towards PT goals: Progressing toward goals    Frequency    Min 3X/week      PT Plan Current plan remains appropriate    Co-evaluation              AM-PAC PT "6 Clicks" Mobility   Outcome Measure  Help needed turning from your back to your side while in a flat bed without using bedrails?: None Help needed moving from lying on your back to sitting on the side of a flat bed without using bedrails?: None Help needed moving to and from a bed to a chair (including a wheelchair)?: A Little Help needed standing up from a chair using your arms (e.g., wheelchair or bedside chair)?: A Little Help needed to walk in hospital room?: A Little Help needed climbing 3-5 steps with a railing? : A Lot 6 Click  Score: 19    End of Session Equipment Utilized During Treatment: Oxygen Activity Tolerance: Patient limited by fatigue;Other (comment)(anxiety for removing O2) Patient left: in bed;with call bell/phone within reach;with nursing/sitter in room Nurse Communication: Mobility status PT Visit Diagnosis: Unsteadiness on feet (R26.81);Muscle weakness (generalized) (M62.81)     Time: 1093-2355 PT Time Calculation (min) (ACUTE ONLY): 44 min  Charges:  $Gait Training: 8-22 mins $Therapeutic Activity: 23-37 mins                    Ramond Dial 10/30/2018, 2:51 PM  Mee Hives, PT MS Acute Rehab Dept. Number: [place] and [place]

## 2018-10-30 NOTE — Progress Notes (Signed)
Occupational Therapy Treatment Patient Details Name: Bianca Murray MRN: [id number] DOB: [birth year] Today's Date: 10/30/2018    History of present illness 63 y/o female admitted secondary to worsening SOB likely from COPD exacerbation. PMH includes COPD on home O2, asthma, HTN, cancer, and tracheostomy s/p reversal.    OT comments  Pt progressing towards established OT goals. However, pt continues to present with decreased activity tolerance as seen by fatigue, SOB, and requiring multiple rest breaks with ADLs and mobility. Pt performing functional mobility in hallway, toileting, and hand hygiene with Min Guard A-Min A for balance and safety. Pt on 4L O2 throughout and SpO2 >94%. Continue to recommend dc home once medically stable per physician. Will continue to follow acutely as admitted.    Follow Up Recommendations  No OT follow up;Supervision - Intermittent    Equipment Recommendations  3 in 1 bedside commode    Recommendations for Other Services      Precautions / Restrictions Precautions Precautions: Fall Precaution Comments: O2 at all times Restrictions Weight Bearing Restrictions: No       Mobility Bed Mobility Overal bed mobility: Modified Independent                Transfers Overall transfer level: Needs assistance Equipment used: None Transfers: Sit to/from Stand Sit to Stand: Supervision         General transfer comment: Supervision for safety    Balance Overall balance assessment: Needs assistance Sitting-balance support: Feet supported Sitting balance-Leahy Scale: Good     Standing balance support: Single extremity supported;During functional activity Standing balance-Leahy Scale: Fair                             ADL either performed or assessed with clinical judgement   ADL Overall ADL's : Needs assistance/impaired Eating/Feeding: Independent;Sitting   Grooming: Wash/dry hands;Min guard;Standing;Wash/dry face Grooming Details  (indicate cue type and reason): Min Guard A for safety         Upper Body Dressing : Min guard;Standing   Lower Body Dressing: Min guard;Sit to/from stand   Toilet Transfer: Min guard;Ambulation;Regular Museum/gallery exhibitions officer and Hygiene: Min guard;Sit to/from stand;Sitting/lateral lean Toileting - Clothing Manipulation Details (indicate cue type and reason): Min Guard A for safety     Functional mobility during ADLs: Min guard;Minimal assistance General ADL Comments: Min GUard A for safety. Pt with increased fatigue with mobility in hallway and required multiopl,e rest breaks     Vision       Perception     Praxis      Cognition Arousal/Alertness: Awake/alert Behavior During Therapy: WFL for tasks assessed/performed Overall Cognitive Status: Within Functional Limits for tasks assessed                                          Exercises Exercises: General Lower Extremity General Exercises - Lower Extremity Ankle Circles/Pumps: AROM;Both;5 reps Quad Sets: AROM;Both;5 reps Heel Slides: AROM;Both;10 reps Hip ABduction/ADduction: AROM;Both;20 reps   Shoulder Instructions       General Comments SpO2 93-96% on 4L O2    Pertinent Vitals/ Pain       Pain Assessment: No/denies pain  Home Living  Prior Functioning/Environment              Frequency  Min 2X/week        Progress Toward Goals  OT Goals(current goals can now be found in the care plan section)  Progress towards OT goals: Progressing toward goals  Acute Rehab OT Goals Patient Stated Goal: to go home  OT Goal Formulation: With patient Time For Goal Achievement: 11/11/18 Potential to Achieve Goals: Good ADL Goals Pt Will Perform Grooming: with modified independence;standing Pt Will Perform Lower Body Dressing: with modified independence;sit to/from stand Pt Will Transfer to Toilet: with modified  independence;ambulating;bedside commode Pt Will Perform Toileting - Clothing Manipulation and hygiene: with modified independence;sit to/from stand Additional ADL Goal #1: Pt will demonstrate increased activity tolerance to perform three ADL tasks in standing with supervision  Plan Discharge plan remains appropriate    Co-evaluation                 AM-PAC OT "6 Clicks" Daily Activity     Outcome Measure   Help from another person eating meals?: None Help from another person taking care of personal grooming?: A Little Help from another person toileting, which includes using toliet, bedpan, or urinal?: A Little Help from another person bathing (including washing, rinsing, drying)?: A Little Help from another person to put on and taking off regular upper body clothing?: None Help from another person to put on and taking off regular lower body clothing?: A Little 6 Click Score: 20    End of Session Equipment Utilized During Treatment: Oxygen(4L)  OT Visit Diagnosis: Unsteadiness on feet (R26.81);Other abnormalities of gait and mobility (R26.89);Muscle weakness (generalized) (M62.81);Other symptoms and signs involving cognitive function   Activity Tolerance Patient tolerated treatment well   Patient Left in bed;with call bell/phone within reach   Nurse Communication Mobility status        Time: 1535-1601 OT Time Calculation (min): 26 min  Charges: OT General Charges $OT Visit: 1 Visit OT Treatments $Self Care/Home Management : 23-37 mins  [place], OTR/L Acute Rehab Pager: [phone] Office: [place] 10/30/2018, 4:53 PM

## 2018-10-30 NOTE — Progress Notes (Signed)
PROGRESS NOTE   Bianca Murray  ZDG:[id number]    DOB: 1955/12/27    DOA: 10/28/2018  PCP: [facility], [facility]   I have briefly reviewed patients previous medical records in [facility].  Brief Narrative:  63 year old female, recently moved from [place], [place] to [facility] in February 2020, lives with her daughter and grandkids, independent, PMH of COPD/asthma, chronic hypoxic respiratory failure on home oxygen 3-4 L/min for the last 3 to 4 years, prior history of intubation/mechanical ventilation, reversed tracheostomy, former smoker quit 2 years ago, HTN, presented with productive cough, progressively worsening wheezing and dyspnea.  No recent travel to obvious areas with Covid 19 or contact with known Covid 19 positive case.  Admitted for COPD exacerbation and acute on chronic hypoxic respiratory failure.  Slowly improving.   Assessment & Plan:   Principal Problem:   COPD with acute exacerbation ([place]) Active Problems:   Acute on chronic respiratory failure with hypoxia and hypercapnia (HCC)   COPD (chronic obstructive pulmonary disease) ([place])   1. COPD exacerbation: ABG 3/17: pH 7.32, PCO2 63, PO2 196, bicarbonate 33 and oxygen saturation 100%.  Influenza panel PCR, RSV panel, HIV screen: Negative.  No leukocytosis.  LDH and LFTs normal.  Chest x-ray 3/18 personally reviewed: No acute abnormality seen.  Patient unable to tolerate BiPAP on admission and refusing same.  Treating with IV Solu-Medrol 60 mg every 6 hourly, doxycycline, Dulera, umeclidinium, bronchodilator nebulizations.  Added flutter valve and supportive treatment for cough.  On droplet isolation.  DC IV fluids.  Reduce IV Solu-Medrol to 60 mg every 12 hourly.  Continues to improve, hopefully can transition to oral prednisone taper and consider discharge in a.m.  Recommend outpatient pulmonology consultation via PCP. 2. Acute on chronic hypoxic and hypercapnic respiratory failure: Secondary to COPD  exacerbation.  Management as indicated above.  Titrate oxygen to maintain oxygen saturation between 89-92%.  Need to reassess in a.m. regarding home oxygen requirement. 3. Essential hypertension: Amlodipine and lisinopril temporarily held due to soft blood pressures.  Blood pressures controlled without medications.  Resume antihypertensives at discharge. 4. Diarrhea/abdominal pain: Present on admission but have resolved.  Monitor. 5. Hypokalemia: Replaced. 6. Normocytic anemia: May be chronic disease.  No prior labs to compare.  Outpatient follow-up. 7. Obesity/Body mass index is 37.13 kg/m.    DVT prophylaxis: Lovenox Code Status: Full Family Communication: None at bedside today. Disposition: DC home pending clinical improvement hopefully in the next 24-48 hours.  Consultants:  None  Procedures:  None  Antimicrobials:  IV ceftriaxone-discontinued. Doxycycline   Subjective: Overall feels much better.  Cough much improved after medications.  Dyspnea and wheezing continue to improve.  Denies any other complaints.  ROS: Not a current smoker.  Reportedly followed with pulmonology in [place] prior to moving to [facility].  Objective:  Vitals:   10/30/18 1000 10/30/18 1100 10/30/18 1145 10/30/18 1349  BP:   134/79   Pulse:   90   Resp:  (!) 23 16   Temp: (!) 97.4 F (36.3 C)  98.4 F (36.9 C)   TempSrc: Axillary  Oral   SpO2: 96%  97% 97%  Weight:      Height:        Examination:  General exam: Pleasant middle-aged female, moderately built and obese, lying comfortably propped up in bed without distress. Respiratory system: Much improved breath sounds.  Essentially clear to auscultation except occasional posterior rhonchi.  Able to speak in full sentences.  No increased work of breathing.  Occasional mild coughing. Cardiovascular system: S1 & S2 heard, RRR. No JVD, murmurs, rubs, gallops or clicks. No pedal edema.  Telemetry personally reviewed: SR-mild ST in the low 100s.  Gastrointestinal system: Abdomen is nondistended, soft and nontender. No organomegaly or masses felt. Normal bowel sounds heard.  Stable Central nervous system: Alert and oriented. No focal neurological deficits.  Stable Extremities: Symmetric 5 x 5 power. Skin: No rashes, lesions or ulcers Psychiatry: Judgement and insight appear normal. Mood & affect appropriate.     Data Reviewed: I have personally reviewed following labs and imaging studies  CBC: Recent Labs  Lab 10/28/18 0150 10/28/18 0255  WBC 7.1  --   NEUTROABS 3.9  --   HGB 11.9* 11.9*  HCT 38.5 35.0*  MCV 92.3  --   PLT 343  --    Basic Metabolic Panel: Recent Labs  Lab 10/28/18 0150 10/28/18 0255  NA 139 139  K 3.7 3.3*  CL 102  --   CO2 28  --   GLUCOSE 134*  --   BUN 6*  --   CREATININE 0.81  --   CALCIUM 9.4  --    Liver Function Tests: Recent Labs  Lab 10/28/18 1255  AST 17  ALT 10  ALKPHOS 70  BILITOT 0.3  PROT 6.9  ALBUMIN 3.5     Recent Results (from the past 240 hour(s))  Respiratory Panel by PCR     Status: None   Collection Time: 10/28/18  4:14 AM  Result Value Ref Range Status   Adenovirus NOT DETECTED NOT DETECTED Final   Coronavirus 229E NOT DETECTED NOT DETECTED Final    Comment: (NOTE) The Coronavirus on the Respiratory Panel, DOES NOT test for the novel  Coronavirus (2019 nCoV)    Coronavirus HKU1 NOT DETECTED NOT DETECTED Final   Coronavirus NL63 NOT DETECTED NOT DETECTED Final   Coronavirus OC43 NOT DETECTED NOT DETECTED Final   Metapneumovirus NOT DETECTED NOT DETECTED Final   Rhinovirus / Enterovirus NOT DETECTED NOT DETECTED Final   Influenza A NOT DETECTED NOT DETECTED Final   Influenza B NOT DETECTED NOT DETECTED Final   Parainfluenza Virus 1 NOT DETECTED NOT DETECTED Final   Parainfluenza Virus 2 NOT DETECTED NOT DETECTED Final   Parainfluenza Virus 3 NOT DETECTED NOT DETECTED Final   Parainfluenza Virus 4 NOT DETECTED NOT DETECTED Final   Respiratory Syncytial  Virus NOT DETECTED NOT DETECTED Final   Bordetella pertussis NOT DETECTED NOT DETECTED Final   Chlamydophila pneumoniae NOT DETECTED NOT DETECTED Final   Mycoplasma pneumoniae NOT DETECTED NOT DETECTED Final    Comment: Performed at [facility], [place]. [address], [place], [place] [address]         Radiology Studies: Dg Chest 2 View  Result Date: 10/29/2018 CLINICAL DATA:  Cough and shortness of breath for 2 days EXAM: CHEST - 2 VIEW COMPARISON:  10/22/2018 FINDINGS: Cardiac shadow is stable when compared with the previous day. The lungs are well aerated without focal infiltrate or sizable effusion. No bony abnormality is noted. IMPRESSION: No acute abnormality seen. Electronically Signed   By: Inez Catalina M.D.   On: 10/29/2018 09:05        Scheduled Meds: . benzonatate  200 mg Oral TID  . doxycycline  100 mg Oral Q12H  . enoxaparin (LOVENOX) injection  40 mg Subcutaneous Daily  . guaiFENesin  1,200 mg Oral BID  . levalbuterol  0.63 mg Nebulization TID  . mouth rinse  15 mL  Mouth Rinse BID  . methylPREDNISolone (SOLU-MEDROL) injection  60 mg Intravenous Q8H  . mometasone-formoterol  1 puff Inhalation BID  . umeclidinium bromide  1 puff Inhalation Daily   Continuous Infusions:    LOS: 2 days     Vernell Leep, MD, FACP, [facility]. [facility]  To contact the attending provider between 7A-7P or the covering provider during after hours 7P-7A, please log into the web site [email or web address] and access using universal [place] password for that web site. If you do not have the password, please call the hospital operator.  10/30/2018, 2:49 PM

## 2018-10-31 MED ORDER — LORATADINE 10 MG PO TABS
10.0000 mg | ORAL_TABLET | Freq: Every day | ORAL | Status: DC
  Administered 2018-10-31 – 2018-11-05 (×6): 10 mg via ORAL
  Filled 2018-10-31 (×6): qty 1

## 2018-10-31 MED ORDER — AMLODIPINE BESYLATE 5 MG PO TABS
2.5000 mg | ORAL_TABLET | Freq: Every day | ORAL | Status: DC
  Administered 2018-10-31 – 2018-11-01 (×2): 2.5 mg via ORAL
  Filled 2018-10-31 (×2): qty 1

## 2018-10-31 MED ORDER — FOLIC ACID 1 MG PO TABS
1.0000 mg | ORAL_TABLET | Freq: Every day | ORAL | Status: DC
  Administered 2018-10-31 – 2018-11-05 (×6): 1 mg via ORAL
  Filled 2018-10-31 (×6): qty 1

## 2018-10-31 NOTE — Progress Notes (Signed)
PROGRESS NOTE   Bianca Murray  KDX:[id number]    DOB: Jan 02, 1956    DOA: 10/28/2018  PCP: Medicine, [facility]   I have briefly reviewed patients previous medical records in [facility].  Brief Narrative:  63 year old female, recently moved from [place], [place] to [facility] in February 2020, lives with her daughter and grandkids, independent, PMH of COPD/asthma, chronic hypoxic respiratory failure on home oxygen 3-4 L/min for the last 3 to 4 years, prior history of intubation/mechanical ventilation, reversed tracheostomy, former smoker quit 2 years ago, HTN, presented with productive cough, progressively worsening wheezing and dyspnea.  No recent travel to obvious areas with Covid 19 or contact with known Covid 19 positive case.  Admitted for COPD exacerbation and acute on chronic hypoxic respiratory failure.  Slowly improving.   Assessment & Plan:   Principal Problem:   COPD with acute exacerbation ([place]) Active Problems:   Acute on chronic respiratory failure with hypoxia and hypercapnia (HCC)   COPD (chronic obstructive pulmonary disease) ([place])   1. COPD exacerbation: ABG 3/17: pH 7.32, PCO2 63, PO2 196, bicarbonate 33 and oxygen saturation 100%.  Influenza panel PCR, RSV panel, HIV screen: Negative.  No leukocytosis.  LDH and LFTs normal.  Chest x-ray 3/18 personally reviewed: No acute abnormality seen.  Patient unable to tolerate BiPAP on admission and refusing same.  Treating with IV Solu-Medrol 60 mg every 6 hourly, doxycycline, Dulera, umeclidinium, bronchodilator nebulizations.  Added flutter valve and supportive treatment for cough.  On droplet isolation.  DC IV fluids.  Reduce IV Solu-Medrol to 60 mg every 12 hourly.  Recommend outpatient pulmonology consultation via PCP.  Continues to gradually improve, states that she feels "75% better" but not ready yet for discharge.  Continue current management for additional 24 hours and reassess in a.m. 2. Acute on chronic  hypoxic and hypercapnic respiratory failure: Secondary to COPD exacerbation.  Management as indicated above.  Titrate oxygen to maintain oxygen saturation between 89-92%.  Need to reassess in a.m. regarding home oxygen requirement. 3. Essential hypertension: Amlodipine and lisinopril temporarily held due to soft blood pressures.  Blood pressures rising.  Resume amlodipine.  4. Diarrhea/abdominal pain: Resolved. 5. Hypokalemia: Replaced. 6. Normocytic anemia: May be chronic disease.  No prior labs to compare.  Outpatient follow-up. 7. Obesity/Body mass index is 37.13 kg/m.    DVT prophylaxis: Lovenox Code Status: Full Family Communication: None at bedside today. Disposition: DC home pending clinical improvement hopefully in the next 24 hours.  Consultants:  None  Procedures:  None  Antimicrobials:  IV ceftriaxone-discontinued. Doxycycline   Subjective: Still having some cough, dyspnea but not much wheezing.  Overall much improved compared to admission but breathing not at baseline.  Indicates that her symptoms have improved by about 75%.  ROS: Not a current smoker.  Reportedly followed with pulmonology in [place] prior to moving to [facility].  Objective:  Vitals:   10/31/18 0335 10/31/18 0809 10/31/18 0815 10/31/18 0822  BP: (!) 145/94 138/88    Pulse: 85 71    Resp: 20     Temp: 98 F (36.7 C) 98.2 F (36.8 C)    TempSrc: Oral Oral    SpO2: 98% 98% 98% 98%  Weight:      Height:        Examination:  General exam: Pleasant middle-aged female, moderately built and obese, sitting up in bed without distress. Respiratory system: Slightly harsh breath sounds bilaterally with occasional expiratory rhonchi.  No crackles.  No increased work of  breathing. Cardiovascular system: S1 & S2 heard, RRR. No JVD, murmurs, rubs, gallops or clicks. No pedal edema.   Gastrointestinal system: Abdomen is nondistended, soft and nontender. No organomegaly or masses felt. Normal bowel sounds  heard.  Stable Central nervous system: Alert and oriented. No focal neurological deficits.  Stable Extremities: Symmetric 5 x 5 power. Skin: No rashes, lesions or ulcers Psychiatry: Judgement and insight appear normal. Mood & affect appropriate.     Data Reviewed: I have personally reviewed following labs and imaging studies  CBC: Recent Labs  Lab 10/28/18 0150 10/28/18 0255  WBC 7.1  --   NEUTROABS 3.9  --   HGB 11.9* 11.9*  HCT 38.5 35.0*  MCV 92.3  --   PLT 343  --    Basic Metabolic Panel: Recent Labs  Lab 10/28/18 0150 10/28/18 0255  NA 139 139  K 3.7 3.3*  CL 102  --   CO2 28  --   GLUCOSE 134*  --   BUN 6*  --   CREATININE 0.81  --   CALCIUM 9.4  --    Liver Function Tests: Recent Labs  Lab 10/28/18 1255  AST 17  ALT 10  ALKPHOS 70  BILITOT 0.3  PROT 6.9  ALBUMIN 3.5     Recent Results (from the past 240 hour(s))  Respiratory Panel by PCR     Status: None   Collection Time: 10/28/18  4:14 AM  Result Value Ref Range Status   Adenovirus NOT DETECTED NOT DETECTED Final   Coronavirus 229E NOT DETECTED NOT DETECTED Final    Comment: (NOTE) The Coronavirus on the Respiratory Panel, DOES NOT test for the novel  Coronavirus (2019 nCoV)    Coronavirus HKU1 NOT DETECTED NOT DETECTED Final   Coronavirus NL63 NOT DETECTED NOT DETECTED Final   Coronavirus OC43 NOT DETECTED NOT DETECTED Final   Metapneumovirus NOT DETECTED NOT DETECTED Final   Rhinovirus / Enterovirus NOT DETECTED NOT DETECTED Final   Influenza A NOT DETECTED NOT DETECTED Final   Influenza B NOT DETECTED NOT DETECTED Final   Parainfluenza Virus 1 NOT DETECTED NOT DETECTED Final   Parainfluenza Virus 2 NOT DETECTED NOT DETECTED Final   Parainfluenza Virus 3 NOT DETECTED NOT DETECTED Final   Parainfluenza Virus 4 NOT DETECTED NOT DETECTED Final   Respiratory Syncytial Virus NOT DETECTED NOT DETECTED Final   Bordetella pertussis NOT DETECTED NOT DETECTED Final   Chlamydophila pneumoniae  NOT DETECTED NOT DETECTED Final   Mycoplasma pneumoniae NOT DETECTED NOT DETECTED Final    Comment: Performed at [facility], [place]. [address] [place], [place] [address]         Radiology Studies: No results found.      Scheduled Meds: . benzonatate  200 mg Oral TID  . doxycycline  100 mg Oral Q12H  . enoxaparin (LOVENOX) injection  40 mg Subcutaneous Daily  . guaiFENesin  1,200 mg Oral BID  . levalbuterol  0.63 mg Nebulization TID  . mouth rinse  15 mL Mouth Rinse BID  . methylPREDNISolone (SOLU-MEDROL) injection  60 mg Intravenous Q12H  . mometasone-formoterol  1 puff Inhalation BID  . umeclidinium bromide  1 puff Inhalation Daily   Continuous Infusions:    LOS: 3 days     Vernell Leep, MD, FACP, [facility]. [facility]  To contact the attending provider between 7A-7P or the covering provider during after hours 7P-7A, please log into the web site [email or web address] and access using universal  password for  that web site. If you do not have the password, please call the hospital operator.  10/31/2018, 10:55 AM

## 2018-10-31 NOTE — Progress Notes (Signed)
SATURATION QUALIFICATIONS: (This note is used to comply with regulatory documentation for home oxygen)  Patient Saturations on Room Air at Rest = 96%  Patient Saturations on Room Air while Ambulating = 88%  Patient Saturations on 3.5 Liters of oxygen while Ambulating = 97%  Please briefly explain why patient needs home oxygen: Pt's oxygen saturations decreased quickly to 88% while ambulating on room air. Pt reported feeling very short of breath. Oxygen placed on 3.5 liters (which is pt's usual baseline). Saturations came up slowly and pt had to sit in chair in hallway to get her breath back.

## 2018-11-01 MED ORDER — LISINOPRIL 40 MG PO TABS
40.0000 mg | ORAL_TABLET | Freq: Every day | ORAL | Status: DC
  Administered 2018-11-01 – 2018-11-02 (×2): 40 mg via ORAL
  Filled 2018-11-01 (×2): qty 1

## 2018-11-01 MED ORDER — AMLODIPINE BESYLATE 5 MG PO TABS
5.0000 mg | ORAL_TABLET | Freq: Every day | ORAL | Status: DC
  Administered 2018-11-02 – 2018-11-05 (×4): 5 mg via ORAL
  Filled 2018-11-01 (×4): qty 1

## 2018-11-01 MED ORDER — BUDESONIDE 0.5 MG/2ML IN SUSP
0.5000 mg | Freq: Two times a day (BID) | RESPIRATORY_TRACT | Status: DC
  Administered 2018-11-01 – 2018-11-05 (×8): 0.5 mg via RESPIRATORY_TRACT
  Filled 2018-11-01 (×8): qty 2

## 2018-11-01 MED ORDER — ARFORMOTEROL TARTRATE 15 MCG/2ML IN NEBU
15.0000 ug | INHALATION_SOLUTION | Freq: Two times a day (BID) | RESPIRATORY_TRACT | Status: DC
  Administered 2018-11-01 – 2018-11-05 (×8): 15 ug via RESPIRATORY_TRACT
  Filled 2018-11-01 (×8): qty 2

## 2018-11-01 NOTE — Progress Notes (Signed)
PROGRESS NOTE [facility]   Nyasha Rahilly   WIO:[id number] DOB: 1956/03/03  DOA: 10/28/2018 PCP: Medicine, [facility]   Brief Narrative:  Bianca Murray is a 63 year old female, recently moved from [place], [place] to [facility] in February 2020, lives with her daughter and grandkids, independent, PMH of COPD/asthma, chronic hypoxic respiratory failure on home oxygen 3-4 L/min for the last 3 to 4 years, prior history of intubation/mechanical ventilation, reversed tracheostomy, former smoker quit 2 years ago, HTN, presented with productive cough, progressively worsening wheezing and dyspnea.  No recent travel to obvious areas with Covid 19 or contact with known Covid 19 positive case.  Admitted for COPD exacerbation and acute on chronic hypoxic respiratory failure.  Slowly improving  Subjective: Patient seen and examined, she continues to be short of breath with mild activity.  She gets very tired while speaking.  Denies chest pain.  Nursing staff report wheezing.  Assessment & Plan:   Principal Problem:   COPD with acute exacerbation ([place]) Active Problems:   Acute on chronic respiratory failure with hypoxia and hypercapnia (HCC)   COPD (chronic obstructive pulmonary disease) (HCC)  COPD exacerbation Checks x-ray with no acute abnormality.  Patient was unable to tolerate BiPAP.  Currently on Solu-Medrol every 12 hours will continue for now.  I have change Dulera to inhalers with Brovana and Pulmicort as patient unable to really take a deep breath.  Will continue Xopenex every 3 hours as needed.  Continue Umeclidinium.  Using flutter valve.  Slowly improving will reassess in 24-hour to see if she is ready for discharge  Acute on chronic hypoxic and hypercapnic respiratory failure Secondary to COPD, manage as above.  Keep sat between 89-92%.  Patient sating well on 3 L O2 which is baseline for her.  Essential hypertension BP medications were held due to soft blood pressure  during admission, amlodipine has been resumed will resume lisinopril as well.  Continue to monitor.  Normocytic anemia Hemoglobin stable, outpatient follow-up recommended.  DVT prophylaxis: Lovenox Code Status: Full Family Communication: None at bedside Disposition Plan: D/C home pending clinical improvement in the next 24 hours.   Consultants:   None  Procedures:   None  Antimicrobials:  IV ceftriaxone DC'd  Doxycycline   Objective: Vitals:   11/01/18 0906 11/01/18 0907 11/01/18 0918 11/01/18 1205  BP:   (!) 140/95 126/87  Pulse:    80  Resp:    20  Temp:    98.3 F (36.8 C)  TempSrc:    Oral  SpO2: 100% 100%  99%  Weight:      Height:        Intake/Output Summary (Last 24 hours) at 11/01/2018 1504 Last data filed at 10/31/2018 1730 Gross per 24 hour  Intake 240 ml  Output -  Net 240 ml   Filed Weights   10/28/18 0156  Weight: 92.1 kg    Examination:  General exam: Appears calm and comfortable  HEENT: AC/AT, PERRLA, OP moist and clear Respiratory system: Decreased breath sound, diffuse wheezing, mild rhonchi at the bases.  Use of accessory muscles. Cardiovascular system: S1 & S2 heard, RRR. No JVD, murmurs, rubs or gallops Central nervous system: Alert and oriented. No focal neurological deficits. Extremities: No pedal edema. Symmetric, strength 5/5   Skin: No rashes, lesions or ulcers Psychiatry: Judgement and insight appear normal. Mood & affect appropriate.    Data Reviewed: I have personally reviewed following labs and imaging studies  CBC: Recent Labs  Lab 10/28/18 0150  10/28/18 0255  WBC 7.1  --   NEUTROABS 3.9  --   HGB 11.9* 11.9*  HCT 38.5 35.0*  MCV 92.3  --   PLT 343  --    Basic Metabolic Panel: Recent Labs  Lab 10/28/18 0150 10/28/18 0255  NA 139 139  K 3.7 3.3*  CL 102  --   CO2 28  --   GLUCOSE 134*  --   BUN 6*  --   CREATININE 0.81  --   CALCIUM 9.4  --    GFR: Estimated Creatinine Clearance: 75.1 mL/min (by  C-G formula based on SCr of 0.81 mg/dL). Liver Function Tests: Recent Labs  Lab 10/28/18 1255  AST 17  ALT 10  ALKPHOS 70  BILITOT 0.3  PROT 6.9  ALBUMIN 3.5   No results for input(s): LIPASE, AMYLASE in the last 168 hours. No results for input(s): AMMONIA in the last 168 hours. Coagulation Profile: No results for input(s): INR, PROTIME in the last 168 hours. Cardiac Enzymes: No results for input(s): CKTOTAL, CKMB, CKMBINDEX, TROPONINI in the last 168 hours. BNP (last 3 results) No results for input(s): PROBNP in the last 8760 hours. HbA1C: No results for input(s): HGBA1C in the last 72 hours. CBG: No results for input(s): GLUCAP in the last 168 hours. Lipid Profile: No results for input(s): CHOL, HDL, LDLCALC, TRIG, CHOLHDL, LDLDIRECT in the last 72 hours. Thyroid Function Tests: No results for input(s): TSH, T4TOTAL, FREET4, T3FREE, THYROIDAB in the last 72 hours. Anemia Panel: No results for input(s): VITAMINB12, FOLATE, FERRITIN, TIBC, IRON, RETICCTPCT in the last 72 hours. Sepsis Labs: No results for input(s): PROCALCITON, LATICACIDVEN in the last 168 hours.  Recent Results (from the past 240 hour(s))  Respiratory Panel by PCR     Status: None   Collection Time: 10/28/18  4:14 AM  Result Value Ref Range Status   Adenovirus NOT DETECTED NOT DETECTED Final   Coronavirus 229E NOT DETECTED NOT DETECTED Final    Comment: (NOTE) The Coronavirus on the Respiratory Panel, DOES NOT test for the novel  Coronavirus (2019 nCoV)    Coronavirus HKU1 NOT DETECTED NOT DETECTED Final   Coronavirus NL63 NOT DETECTED NOT DETECTED Final   Coronavirus OC43 NOT DETECTED NOT DETECTED Final   Metapneumovirus NOT DETECTED NOT DETECTED Final   Rhinovirus / Enterovirus NOT DETECTED NOT DETECTED Final   Influenza A NOT DETECTED NOT DETECTED Final   Influenza B NOT DETECTED NOT DETECTED Final   Parainfluenza Virus 1 NOT DETECTED NOT DETECTED Final   Parainfluenza Virus 2 NOT DETECTED NOT  DETECTED Final   Parainfluenza Virus 3 NOT DETECTED NOT DETECTED Final   Parainfluenza Virus 4 NOT DETECTED NOT DETECTED Final   Respiratory Syncytial Virus NOT DETECTED NOT DETECTED Final   Bordetella pertussis NOT DETECTED NOT DETECTED Final   Chlamydophila pneumoniae NOT DETECTED NOT DETECTED Final   Mycoplasma pneumoniae NOT DETECTED NOT DETECTED Final    Comment: Performed at [facility], [address], [place], [place] [address]      Radiology Studies: No results found.    Scheduled Meds: . [START ON 11/02/2018] amLODipine  5 mg Oral Daily  . arformoterol  15 mcg Nebulization BID  . benzonatate  200 mg Oral TID  . budesonide (PULMICORT) nebulizer solution  0.5 mg Nebulization BID  . doxycycline  100 mg Oral Q12H  . enoxaparin (LOVENOX) injection  40 mg Subcutaneous Daily  . folic acid  1 mg Oral Daily  . guaiFENesin  1,200 mg Oral BID  . loratadine  10 mg Oral Daily  . mouth rinse  15 mL Mouth Rinse BID  . methylPREDNISolone (SOLU-MEDROL) injection  60 mg Intravenous Q12H  . umeclidinium bromide  1 puff Inhalation Daily   Continuous Infusions:   LOS: 4 days    Time spent: Total of 35 minutes spent with pt, greater than 50% of which was spent in discussion of  treatment, counseling and coordination of care   Chipper Oman, MD  How to contact the [facility] Attending or Consulting provider [place] or covering provider during after hours [place], for this patient?  1. Check the care team in [facility] and look for a) attending/consulting [facility] provider listed and b) the [facility] team listed 2. Log into [email or web address] and use [place]'s universal password to access. If you do not have the password, please contact the hospital operator. 3. Locate the [facility] provider you are looking for under [facility] and page to a number that you can be directly reached. 4. If you still have difficulty reaching the provider, please page the [facility] (Director on Call) for the Hospitalists listed on  amion for assistance.

## 2018-11-02 DIAGNOSIS — D649 Anemia, unspecified: Secondary | ICD-10-CM

## 2018-11-02 DIAGNOSIS — I10 Essential (primary) hypertension: Secondary | ICD-10-CM

## 2018-11-02 MED ORDER — GUAIFENESIN-DM 100-10 MG/5ML PO SYRP
10.0000 mL | ORAL_SOLUTION | Freq: Four times a day (QID) | ORAL | Status: DC
  Administered 2018-11-02 – 2018-11-05 (×13): 10 mL via ORAL
  Filled 2018-11-02 (×13): qty 10

## 2018-11-02 MED ORDER — IPRATROPIUM-ALBUTEROL 0.5-2.5 (3) MG/3ML IN SOLN
3.0000 mL | Freq: Three times a day (TID) | RESPIRATORY_TRACT | Status: DC
  Administered 2018-11-03 – 2018-11-05 (×7): 3 mL via RESPIRATORY_TRACT
  Filled 2018-11-02 (×7): qty 3

## 2018-11-02 MED ORDER — IPRATROPIUM-ALBUTEROL 0.5-2.5 (3) MG/3ML IN SOLN
3.0000 mL | Freq: Four times a day (QID) | RESPIRATORY_TRACT | Status: DC
  Administered 2018-11-02 (×3): 3 mL via RESPIRATORY_TRACT
  Filled 2018-11-02 (×2): qty 3

## 2018-11-02 MED ORDER — METHYLPREDNISOLONE SODIUM SUCC 125 MG IJ SOLR
60.0000 mg | Freq: Every day | INTRAMUSCULAR | Status: DC
  Administered 2018-11-03: 60 mg via INTRAVENOUS
  Filled 2018-11-02: qty 2

## 2018-11-02 MED ORDER — IPRATROPIUM-ALBUTEROL 0.5-2.5 (3) MG/3ML IN SOLN
3.0000 mL | RESPIRATORY_TRACT | Status: DC | PRN
  Filled 2018-11-02: qty 3

## 2018-11-02 MED ORDER — LISINOPRIL 10 MG PO TABS
20.0000 mg | ORAL_TABLET | Freq: Every day | ORAL | Status: DC
  Administered 2018-11-03 – 2018-11-05 (×3): 20 mg via ORAL
  Filled 2018-11-02 (×3): qty 2

## 2018-11-02 NOTE — Progress Notes (Addendum)
PROGRESS NOTE    Bianca Murray  JJK:[id number] DOB: [birth year] DOA: 10/28/2018 PCP: Medicine, [facility]    Brief Narrative:  63 year old female who presented with respiratory distress.  She does have a past medical history of chronic hypoxic respiratory failure, COPD and asthma.  Recently moved from [place], reported 4 to 5 days of worsening shortness of breath, associated with cough and wheezing.  On her initial physical examination blood pressure was 110/73, heart rate 119, respiratory 22, oxygen saturation 99% on BiPAP.  She had moist mucous membranes, her lungs had diffuse wheezing bilaterally with diminished breath sounds and prolonged expiratory phase, heart S1-S2 present, tachycardic, abdomen soft no lower extremity edema.  Her chest x-ray had hyperinflation with bibasilar atelectasis.  Patient was admitted to the hospital working diagnosis acute on chronic hypoxic respiratory failure due to COPD exacerbation.  Assessment & Plan:   Principal Problem:   COPD with acute exacerbation ([place]) Active Problems:   Acute on chronic respiratory failure with hypoxia and hypercapnia (HCC)   COPD (chronic obstructive pulmonary disease) ([place])  1. Acute on chronic hypoxic respiratory failure due to COPD exacerbation. Patient slowly improving but still not yet back to her baseline. Will continue with aggressive bronchodilator therapy, systemic steroids and supplemental 02 per [place]. Taper methylprednisolone, continue inhaled corticosteroids and long acting b agonist.   2. HTN. Continue blood pressure control with amlodipine and lisinopril.   3. Normocytic anemia. Hb has remained stable.   4. Obesity. Calculated BMI 37,1  DVT prophylaxis: enoxaparin   Code Status: full Family Communication: no family at the bedside  Disposition Plan/ discharge barriers: pending clinical improvement.   Body mass index is 37.13 kg/m. Malnutrition Type:  Nutrition Problem: Increased nutrient  needs Etiology: (COPD exacerbation)   Malnutrition Characteristics:  Signs/Symptoms: estimated needs   Nutrition Interventions:  Interventions: Refer to RD note for recommendations  RN Pressure Injury Documentation:     Consultants:     Procedures:     Antimicrobials:    doxycyline    Subjective: Patient continue to have dyspnea, mild improvement but not yet back to baseline, continue to have cough and wheezing, has not been out of bed yet. At home uses 3 to 4 LPN of supplemental 02 per [place].   Objective: Vitals:   11/02/18 0546 11/02/18 0816 11/02/18 0832 11/02/18 0835  BP: 118/89 123/86 (!) 116/100   Pulse: 70  68 99  Resp: 14 20 (!) 21 17  Temp: 97.6 F (36.4 C)  98.1 F (36.7 C)   TempSrc: Oral  Oral   SpO2: 100%  93% 92%  Weight:      Height:        Intake/Output Summary (Last 24 hours) at 11/02/2018 0935 Last data filed at 11/01/2018 2008 Gross per 24 hour  Intake 580 ml  Output --  Net 580 ml   Filed Weights   10/28/18 0156  Weight: 92.1 kg    Examination:   General: deconditioned and ill looking appearing.  Neurology: Awake and alert, non focal  E ENT: mild pallor, no icterus, oral mucosa moist Cardiovascular: No JVD. S1-S2 present, rhythmic, no gallops, rubs, or murmurs. No lower extremity edema. Pulmonary: positive breath sounds bilaterally, poor air movement, positive expiratory wheezing, and bilateral rales, no significant rhonchi. Gastrointestinal. Abdomen with no organomegaly, non tender, no rebound or guarding Skin. No rashes Musculoskeletal: no joint deformities     Data Reviewed: I have personally reviewed following labs and imaging studies  CBC: Recent Labs  Lab 10/28/18 0150 10/28/18 0255  WBC 7.1  --   NEUTROABS 3.9  --   HGB 11.9* 11.9*  HCT 38.5 35.0*  MCV 92.3  --   PLT 343  --    Basic Metabolic Panel: Recent Labs  Lab 10/28/18 0150 10/28/18 0255  NA 139 139  K 3.7 3.3*  CL 102  --   CO2 28  --    GLUCOSE 134*  --   BUN 6*  --   CREATININE 0.81  --   CALCIUM 9.4  --    GFR: Estimated Creatinine Clearance: 75.1 mL/min (by C-G formula based on SCr of 0.81 mg/dL). Liver Function Tests: Recent Labs  Lab 10/28/18 1255  AST 17  ALT 10  ALKPHOS 70  BILITOT 0.3  PROT 6.9  ALBUMIN 3.5   No results for input(s): LIPASE, AMYLASE in the last 168 hours. No results for input(s): AMMONIA in the last 168 hours. Coagulation Profile: No results for input(s): INR, PROTIME in the last 168 hours. Cardiac Enzymes: No results for input(s): CKTOTAL, CKMB, CKMBINDEX, TROPONINI in the last 168 hours. BNP (last 3 results) No results for input(s): PROBNP in the last 8760 hours. HbA1C: No results for input(s): HGBA1C in the last 72 hours. CBG: No results for input(s): GLUCAP in the last 168 hours. Lipid Profile: No results for input(s): CHOL, HDL, LDLCALC, TRIG, CHOLHDL, LDLDIRECT in the last 72 hours. Thyroid Function Tests: No results for input(s): TSH, T4TOTAL, FREET4, T3FREE, THYROIDAB in the last 72 hours. Anemia Panel: No results for input(s): VITAMINB12, FOLATE, FERRITIN, TIBC, IRON, RETICCTPCT in the last 72 hours.    Radiology Studies: I have reviewed all of the imaging during this hospital visit personally     Scheduled Meds:  amLODipine  5 mg Oral Daily   arformoterol  15 mcg Nebulization BID   benzonatate  200 mg Oral TID   budesonide (PULMICORT) nebulizer solution  0.5 mg Nebulization BID   doxycycline  100 mg Oral Q12H   enoxaparin (LOVENOX) injection  40 mg Subcutaneous Daily   folic acid  1 mg Oral Daily   guaiFENesin  1,200 mg Oral BID   lisinopril  40 mg Oral Daily   loratadine  10 mg Oral Daily   mouth rinse  15 mL Mouth Rinse BID   methylPREDNISolone (SOLU-MEDROL) injection  60 mg Intravenous Q12H   umeclidinium bromide  1 puff Inhalation Daily   Continuous Infusions:   LOS: 5 days        Najae Filsaime Gerome Apley, MD

## 2018-11-02 NOTE — Progress Notes (Signed)
Received patient from [place], [place] at 1500 today, assessed patient at 1600, and obtained vital signs.

## 2018-11-03 ENCOUNTER — Encounter (HOSPITAL_COMMUNITY): Payer: Self-pay | Admitting: Internal Medicine

## 2018-11-03 DIAGNOSIS — J9611 Chronic respiratory failure with hypoxia: Secondary | ICD-10-CM | POA: Diagnosis present

## 2018-11-03 DIAGNOSIS — J9612 Chronic respiratory failure with hypercapnia: Secondary | ICD-10-CM

## 2018-11-03 DIAGNOSIS — R739 Hyperglycemia, unspecified: Secondary | ICD-10-CM | POA: Diagnosis present

## 2018-11-03 LAB — BASIC METABOLIC PANEL
Anion gap: 10 (ref 5–15)
BUN: 19 mg/dL (ref 8–23)
CALCIUM: 9 mg/dL (ref 8.9–10.3)
CO2: 26 mmol/L (ref 22–32)
Chloride: 99 mmol/L (ref 98–111)
Creatinine, Ser: 0.81 mg/dL (ref 0.44–1.00)
GFR calc Af Amer: >60 mL/min (ref >60)
GFR calc non Af Amer: >60 mL/min (ref >60)
Glucose, Bld: 165 mg/dL — ABNORMAL HIGH (ref 70–99)
Potassium: 3.9 mmol/L (ref 3.5–5.1)
Sodium: 135 mmol/L (ref 135–145)

## 2018-11-03 LAB — GLUCOSE, CAPILLARY
Glucose-Capillary: 221 mg/dL — ABNORMAL HIGH (ref 70–99)
Glucose-Capillary: 194 mg/dL — ABNORMAL HIGH (ref 70–99)

## 2018-11-03 MED ORDER — INSULIN ASPART 100 UNIT/ML ~~LOC~~ SOLN
0.0000 [IU] | Freq: Three times a day (TID) | SUBCUTANEOUS | Status: DC
  Administered 2018-11-03: 5 [IU] via SUBCUTANEOUS
  Administered 2018-11-04: 3 [IU] via SUBCUTANEOUS

## 2018-11-03 MED ORDER — METHYLPREDNISOLONE SODIUM SUCC 125 MG IJ SOLR
60.0000 mg | Freq: Four times a day (QID) | INTRAMUSCULAR | Status: DC
  Administered 2018-11-03 – 2018-11-04 (×4): 60 mg via INTRAVENOUS
  Filled 2018-11-03 (×4): qty 2

## 2018-11-03 MED ORDER — INSULIN ASPART 100 UNIT/ML ~~LOC~~ SOLN
0.0000 [IU] | Freq: Every day | SUBCUTANEOUS | Status: DC
  Administered 2018-11-04: 2 [IU] via SUBCUTANEOUS

## 2018-11-03 MED ORDER — ALPRAZOLAM 0.25 MG PO TABS: 0.2500 mg | ORAL_TABLET | Freq: Two times a day (BID) | ORAL | Status: DC | PRN

## 2018-11-03 NOTE — Progress Notes (Signed)
Physical Therapy Treatment Patient Details Name: Bianca Murray MRN: [id number] DOB: Sep 12, 1955 Today's Date: 11/03/2018    History of Present Illness 63 y/o female admitted secondary to worsening SOB likely from COPD exacerbation. PMH includes COPD on home O2, asthma, HTN, cancer, and tracheostomy s/p reversal.     PT Comments    Pt trained in use of rollator today for energy conservation and did very well in addition to having increased confidence and decreased anxiety with gait. She ambulated 350' with seated rest break halfway. O2 sats 97% before ambulation, 91% during, remained on 4L O2. Recommend rollator for d/c home. PT will continue to follow.    Follow Up Recommendations  Home health PT;Supervision for mobility/OOB     Equipment Recommendations  3in1 (PT);Other (comment)(rollator with seat and basket)    Recommendations for Other Services       Precautions / Restrictions Precautions Precautions: Fall Precaution Comments: O2 at all times Restrictions Weight Bearing Restrictions: No    Mobility  Bed Mobility Overal bed mobility: Modified Independent Bed Mobility: Supine to Sit;Sit to Supine     Supine to sit: Modified independent (Device/Increase time) Sit to supine: Modified independent (Device/Increase time)   General bed mobility comments: pt able to get in and out of bed without assist. Pt prefers to have [place] elevated for her breathing. Discussed ways to elevate her HOB at home without just using pillows that increase neck flexion. Pt voiced understanding  Transfers Overall transfer level: Needs assistance Equipment used: None;4-wheeled walker Transfers: Sit to/from Stand Sit to Stand: Supervision         General transfer comment: Supervision for safety, vc's for hand placement with use of rollator  Ambulation/Gait Ambulation/Gait assistance: Min guard Gait Distance (Feet): 350 Feet(seated rest halfway) Assistive device: 4-wheeled walker Gait  Pattern/deviations: Step-through pattern;Decreased stride length Gait velocity: Decreased  Gait velocity interpretation: <1.8 ft/sec, indicate of risk for recurrent falls General Gait Details: Pt ambulated on 4L O2 with use of rollator, pt really liked having availability of seating with ambulation and took seated rest at halfway point. SpO2 before amb 97%, 91% during.    Stairs             Wheelchair Mobility    Modified Rankin (Stroke Patients Only)       Balance Overall balance assessment: Needs assistance Sitting-balance support: Feet supported Sitting balance-Leahy Scale: Good     Standing balance support: Single extremity supported;During functional activity Standing balance-Leahy Scale: Fair                              Cognition Arousal/Alertness: Awake/alert Behavior During Therapy: WFL for tasks assessed/performed Overall Cognitive Status: Within Functional Limits for tasks assessed                                        Exercises General Exercises - Lower Extremity Ankle Circles/Pumps: AROM;Both;10 reps;Supine Heel Slides: AROM;Both;10 reps Straight Leg Raises: AROM;Both;10 reps;Supine    General Comments General comments (skin integrity, edema, etc.): discussed other energy conservation techniques      Pertinent Vitals/Pain Pain Assessment: No/denies pain    Home Living                      Prior Function            PT Goals (current goals  can now be found in the care plan section) Acute Rehab PT Goals Patient Stated Goal: to go home  PT Goal Formulation: With patient Time For Goal Achievement: 11/11/18 Potential to Achieve Goals: Good Progress towards PT goals: Progressing toward goals    Frequency    Min 3X/week      PT Plan Current plan remains appropriate;Equipment recommendations need to be updated    Co-evaluation              AM-PAC PT "6 Clicks" Mobility   Outcome Measure   Help needed turning from your back to your side while in a flat bed without using bedrails?: None Help needed moving from lying on your back to sitting on the side of a flat bed without using bedrails?: None Help needed moving to and from a bed to a chair (including a wheelchair)?: A Little Help needed standing up from a chair using your arms (e.g., wheelchair or bedside chair)?: A Little Help needed to walk in hospital room?: A Little Help needed climbing 3-5 steps with a railing? : A Lot 6 Click Score: 19    End of Session Equipment Utilized During Treatment: Oxygen;Gait belt Activity Tolerance: Patient tolerated treatment well Patient left: in bed;with call bell/phone within reach Nurse Communication: Mobility status PT Visit Diagnosis: Unsteadiness on feet (R26.81);Muscle weakness (generalized) (M62.81)     Time: 1100-1130 PT Time Calculation (min) (ACUTE ONLY): 30 min  Charges:  $Gait Training: 8-22 mins $Therapeutic Exercise: 8-22 mins                     Leighton Roach, PT  Acute Rehab Services  Pager [phone] Office [place] 11/03/2018, 12:03 PM

## 2018-11-03 NOTE — Progress Notes (Signed)
Nutrition Follow-up  DOCUMENTATION CODES:   Obesity unspecified  INTERVENTION:    Continue heart healthy CHO modified diet; current intake is meeting nutrition needs  NUTRITION DIAGNOSIS:   Increased nutrient needs related to (COPD exacerbation) as evidenced by estimated needs.  Ongoing   GOAL:   Patient will meet greater than or equal to 90% of their needs  Met with oral intake of meals  MONITOR:   PO intake  ASSESSMENT:   63 y.o. pt with PMH of COPD, HTN, and asthma presents with acute exacerbation of COPD.   Patient reports feeling okay today. Her COPD exacerbation has been slow to improve. Steroids are being weaned. She continues to eat well, consuming 75-100% of all meals. No supplements needed at this time.   Labs and medications reviewed.   NUTRITION - FOCUSED PHYSICAL EXAM:    Most Recent Value  Orbital Region  No depletion  Upper Arm Region  No depletion  Thoracic and Lumbar Region  No depletion  Buccal Region  No depletion  Temple Region  No depletion  Clavicle Bone Region  No depletion  Clavicle and Acromion Bone Region  No depletion  Scapular Bone Region  No depletion  Dorsal Hand  No depletion  Patellar Region  No depletion  Anterior Thigh Region  No depletion  Posterior Calf Region  No depletion  Edema (RD Assessment)  None  Hair  Reviewed  Eyes  Reviewed  Mouth  Reviewed  Skin  Reviewed  Nails  Reviewed       Diet Order:   Diet Order            Diet heart healthy/carb modified Room service appropriate? Yes; Fluid consistency: Thin  Diet effective now              EDUCATION NEEDS:   Not appropriate for education at this time  Skin:  Skin Assessment: Reviewed RN Assessment  Last BM:  3/23  Height:   Ht Readings from Last 1 Encounters:  10/28/18 5' 2"  (1.575 m)    Weight:   Wt Readings from Last 1 Encounters:  10/28/18 92.1 kg    Ideal Body Weight:  50 kg  BMI:  Body mass index is 37.13 kg/m.  Estimated  Nutritional Needs:   Kcal:  1700-1900 kcal  Protein:  95-115g  Fluid:  2 L    Molli Barrows, RD, LDN, CNSC Pager [phone] After Hours Pager [phone]

## 2018-11-03 NOTE — Progress Notes (Signed)
Per conversation with NP, O2 SATS at 100% with 4L.  Pt's O2 decreased to 3L and noted 96%.  Pt states she is always out of breath but she will try it.  Discussed the availability of prn med available for anxiety.  Pt states she will take it later today and see if it helps.

## 2018-11-03 NOTE — Progress Notes (Signed)
[facility] PROGRESS NOTE  [name] MPN:[id number] DOB: 19-Jan-1956 DOA: 10/28/2018 PCP: Medicine, [facility]  Assessment/Plan:  Acute on chronic hypoxic and hypercapnic respiratory failure Secondary to COPD.Marland Kitchen not much improvement. Sats 100% on 4L. Need to keep sat between 89-92%. Patient gets anxious and reports worsening sob when oxygen weaned. Continues with diminished BS and wheeze.  -increase solumedrol -wean oxygen -low dose xanax as needed -monitor  COPD exacerbation Chest x-ray with no acute abnormality. Hx of ESLD has been evaluated to lung transplant. Patient was unable to tolerate BiPAP.   -see above - continue inhalers with Brovana and Pulmicort as patient unable to really take a deep breath. - Will continue Xopenex every 3 hours as needed.  -Continue Umeclidinium. -Continue flutter valve.  -will request records from pulmonologist in [place] hypertension controlled. BP medications were held due to soft blood pressure during admission. - amlodipine and lisinopril resumed  Normocytic anemia Hemoglobin stable, outpatient follow-up recommended.  Hyperglycemia: likely related to steroids. Serum glucose 165 this am.  -will start SSI for optimal control  Code Status: full Family Communication: daughter on phone Disposition Plan: home when ready   Consultants:    Procedures:    Antibiotics:  Doxycycline 7 days  HPI/Subjective: Bianca Murray is a 63 year old female, recently moved from [place], [place] to [facility] in February 2020, lives with her daughter and grandkids, independent, PMH of COPD/asthma, chronic hypoxic respiratory failure on home oxygen 3-4 L/min for the last 3 to 4 years, prior history of intubation/mechanical ventilation, reversed tracheostomy, former smoker quit 2 years ago, HTN, presented 10/28/18 with productive cough, progressively worsening wheezing and dyspnea. No recent travel to obvious areas with  Covid 19 or contact with known Covid 19 positive case. Admitted for COPD exacerbation and acute on chronic hypoxic respiratory failure. Of note daughter reports patient has been evaluated for lung transplant in [place]. Have requested OV notes. Last visit 01/2018 with pulmonary function tests   Objective: Vitals:   11/03/18 0717 11/03/18 0845  BP:  112/78  Pulse:  78  Resp:  18  Temp:  98.1 F (36.7 C)  SpO2: 100% 100%    Intake/Output Summary (Last 24 hours) at 11/03/2018 1101 Last data filed at 11/03/2018 0300 Gross per 24 hour  Intake 580 ml  Output -  Net 580 ml   Filed Weights   10/28/18 0156  Weight: 92.1 kg    Exam:   General:  Sitting up in bed bracing self with arms, mild respiratory distress  Cardiovascular: rrr no mgr no LE edema  Respiratory: mild to moderate increased work of breathing with conversation. Bracing self with arms, using accessory muscles, bs quite distant with diffuse wheezing  Abdomen: soft non-distended +BS throughout  Musculoskeletal: joints without swelling/erythema   Data Reviewed: Basic Metabolic Panel: Recent Labs  Lab 10/28/18 0150 10/28/18 0255 11/03/18 0241  NA 139 139 135  K 3.7 3.3* 3.9  CL 102  --  99  CO2 28  --  26  GLUCOSE 134*  --  165*  BUN 6*  --  19  CREATININE 0.81  --  0.81  CALCIUM 9.4  --  9.0   Liver Function Tests: Recent Labs  Lab 10/28/18 1255  AST 17  ALT 10  ALKPHOS 70  BILITOT 0.3  PROT 6.9  ALBUMIN 3.5   No results for input(s): LIPASE, AMYLASE in the last 168 hours. No results for input(s): AMMONIA in the last 168 hours. CBC: Recent Labs  Lab  10/28/18 0150 10/28/18 0255  WBC 7.1  --   NEUTROABS 3.9  --   HGB 11.9* 11.9*  HCT 38.5 35.0*  MCV 92.3  --   PLT 343  --    Cardiac Enzymes: No results for input(s): CKTOTAL, CKMB, CKMBINDEX, TROPONINI in the last 168 hours. BNP (last 3 results) No results for input(s): BNP in the last 8760 hours.  ProBNP (last 3 results) No  results for input(s): PROBNP in the last 8760 hours.  CBG: No results for input(s): GLUCAP in the last 168 hours.  Recent Results (from the past 240 hour(s))  Respiratory Panel by PCR     Status: None   Collection Time: 10/28/18  4:14 AM  Result Value Ref Range Status   Adenovirus NOT DETECTED NOT DETECTED Final   Coronavirus 229E NOT DETECTED NOT DETECTED Final    Comment: (NOTE) The Coronavirus on the Respiratory Panel, DOES NOT test for the novel  Coronavirus (2019 nCoV)    Coronavirus HKU1 NOT DETECTED NOT DETECTED Final   Coronavirus NL63 NOT DETECTED NOT DETECTED Final   Coronavirus OC43 NOT DETECTED NOT DETECTED Final   Metapneumovirus NOT DETECTED NOT DETECTED Final   Rhinovirus / Enterovirus NOT DETECTED NOT DETECTED Final   Influenza A NOT DETECTED NOT DETECTED Final   Influenza B NOT DETECTED NOT DETECTED Final   Parainfluenza Virus 1 NOT DETECTED NOT DETECTED Final   Parainfluenza Virus 2 NOT DETECTED NOT DETECTED Final   Parainfluenza Virus 3 NOT DETECTED NOT DETECTED Final   Parainfluenza Virus 4 NOT DETECTED NOT DETECTED Final   Respiratory Syncytial Virus NOT DETECTED NOT DETECTED Final   Bordetella pertussis NOT DETECTED NOT DETECTED Final   Chlamydophila pneumoniae NOT DETECTED NOT DETECTED Final   Mycoplasma pneumoniae NOT DETECTED NOT DETECTED Final    Comment: Performed at [facility], [address], [place], [place] [address]     Studies: No results found.  Scheduled Meds: . amLODipine  5 mg Oral Daily  . arformoterol  15 mcg Nebulization BID  . benzonatate  200 mg Oral TID  . budesonide (PULMICORT) nebulizer solution  0.5 mg Nebulization BID  . doxycycline  100 mg Oral Q12H  . enoxaparin (LOVENOX) injection  40 mg Subcutaneous Daily  . folic acid  1 mg Oral Daily  . guaiFENesin-dextromethorphan  10 mL Oral Q6H  . ipratropium-albuterol  3 mL Nebulization TID  . lisinopril  20 mg Oral Daily  . loratadine  10 mg Oral Daily  . mouth rinse  15  mL Mouth Rinse BID  . methylPREDNISolone (SOLU-MEDROL) injection  60 mg Intravenous Q6H   Continuous Infusions:  Principal Problem:   Acute on chronic respiratory failure with hypoxia and hypercapnia (HCC) Active Problems:   COPD with acute exacerbation (HCC)   COPD (chronic obstructive pulmonary disease) (HCC)   Hyperglycemia   Chronic respiratory failure with hypoxia and hypercapnia ([place])    Time spent: 45 minutes    [facility] M  [facility]  If 7PM-7AM, please contact night-coverage at [email or web address], password [facility] 11/03/2018, 11:01 AM  LOS: 6 days

## 2018-11-03 NOTE — Progress Notes (Signed)
Occupational Therapy Treatment Patient Details Name: Bianca Murray MRN: [id number] DOB: 10/17/1955 Today's Date: 11/03/2018    History of present illness 63 y/o female admitted secondary to worsening SOB likely from COPD exacerbation. PMH includes COPD on home O2, asthma, HTN, cancer, and tracheostomy s/p reversal.    OT comments  Pt progressing towards OT goals this session. Focus on energy conservation education and education on uses of 3 in1 (BSC, toilet riser, shower seat) Pt verbalized understanding. Pt able to perform transfers at min guard/supervision level with Rollator, standing grooming tasks at min guard. Pt continues to benefit from skilled OT acutely.    Follow Up Recommendations  No OT follow up;Supervision - Intermittent    Equipment Recommendations  3 in 1 bedside commode    Recommendations for Other Services      Precautions / Restrictions Precautions Precautions: Fall Precaution Comments: O2 at all times Restrictions Weight Bearing Restrictions: No       Mobility Bed Mobility Overal bed mobility: Modified Independent Bed Mobility: Supine to Sit;Sit to Supine     Supine to sit: Modified independent (Device/Increase time) Sit to supine: Modified independent (Device/Increase time)      Transfers Overall transfer level: Needs assistance Equipment used: None;4-wheeled walker Transfers: Sit to/from Stand Sit to Stand: Supervision         General transfer comment: Supervision for safety, vc's for hand placement with use of rollator    Balance Overall balance assessment: Needs assistance Sitting-balance support: Feet supported Sitting balance-Leahy Scale: Good     Standing balance support: Single extremity supported;During functional activity Standing balance-Leahy Scale: Fair                             ADL either performed or assessed with clinical judgement   ADL Overall ADL's : Needs assistance/impaired     Grooming: Wash/dry  hands;Min guard;Standing;Wash/dry face Grooming Details (indicate cue type and reason): Min Guard A for safety                 Toilet Transfer: Min guard;Ambulation;Regular Museum/gallery exhibitions officer and Hygiene: Min guard;Sit to/from stand;Sitting/lateral lean Toileting - Clothing Manipulation Details (indicate cue type and reason): Min Guard A for safety     Functional mobility during ADLs: Min guard General ADL Comments: edcuated on multiple uses of 3 in 1, verbally reviewed energy conservation techniques      Vision       Perception     Praxis      Cognition Arousal/Alertness: Awake/alert Behavior During Therapy: WFL for tasks assessed/performed Overall Cognitive Status: Within Functional Limits for tasks assessed                                          Exercises     Shoulder Instructions       General Comments      Pertinent Vitals/ Pain       Pain Assessment: No/denies pain  Home Living                                          Prior Functioning/Environment              Frequency  Min 2X/week  Progress Toward Goals  OT Goals(current goals can now be found in the care plan section)  Progress towards OT goals: Progressing toward goals  Acute Rehab OT Goals Patient Stated Goal: to go home  OT Goal Formulation: With patient Time For Goal Achievement: 11/11/18 Potential to Achieve Goals: Good  Plan Discharge plan remains appropriate    Co-evaluation                 AM-PAC OT "6 Clicks" Daily Activity     Outcome Measure   Help from another person eating meals?: None Help from another person taking care of personal grooming?: A Little Help from another person toileting, which includes using toliet, bedpan, or urinal?: A Little Help from another person bathing (including washing, rinsing, drying)?: A Little Help from another person to put on and taking off regular upper  body clothing?: None Help from another person to put on and taking off regular lower body clothing?: A Little 6 Click Score: 20    End of Session Equipment Utilized During Treatment: Oxygen(4L)  OT Visit Diagnosis: Unsteadiness on feet (R26.81);Other abnormalities of gait and mobility (R26.89);Muscle weakness (generalized) (M62.81);Other symptoms and signs involving cognitive function   Activity Tolerance Patient tolerated treatment well   Patient Left in bed;with call bell/phone within reach   Nurse Communication Mobility status        Time: 2836-6294 OT Time Calculation (min): 17 min  Charges: OT General Charges $OT Visit: 1 Visit OT Treatments $Self Care/Home Management : 8-22 mins  Hulda Humphrey OTR/L Acute Rehabilitation Services Pager: [phone] Office: [place] 11/03/2018, 5:54 PM

## 2018-11-04 DIAGNOSIS — R739 Hyperglycemia, unspecified: Secondary | ICD-10-CM

## 2018-11-04 LAB — GLUCOSE, CAPILLARY
Glucose-Capillary: 161 mg/dL — ABNORMAL HIGH (ref 70–99)
Glucose-Capillary: 209 mg/dL — ABNORMAL HIGH (ref 70–99)
Glucose-Capillary: 132 mg/dL — ABNORMAL HIGH (ref 70–99)
Glucose-Capillary: 236 mg/dL — ABNORMAL HIGH (ref 70–99)

## 2018-11-04 LAB — BASIC METABOLIC PANEL
Anion gap: 7 (ref 5–15)
BUN: 19 mg/dL (ref 8–23)
CO2: 33 mmol/L — ABNORMAL HIGH (ref 22–32)
CREATININE: 0.8 mg/dL (ref 0.44–1.00)
Calcium: 9.1 mg/dL (ref 8.9–10.3)
Chloride: 98 mmol/L (ref 98–111)
GFR calc non Af Amer: >60 mL/min (ref >60)
Glucose, Bld: 184 mg/dL — ABNORMAL HIGH (ref 70–99)
Potassium: 4.2 mmol/L (ref 3.5–5.1)
Sodium: 138 mmol/L (ref 135–145)

## 2018-11-04 LAB — CBC
HCT: 35.1 % — ABNORMAL LOW (ref 36.0–46.0)
Hemoglobin: 10.8 g/dL — ABNORMAL LOW (ref 12.0–15.0)
MCH: 28 pg (ref 26.0–34.0)
MCHC: 30.8 g/dL (ref 30.0–36.0)
MCV: 90.9 fL (ref 80.0–100.0)
Platelets: 308 10*3/uL (ref 150–400)
RBC: 3.86 MIL/uL — ABNORMAL LOW (ref 3.87–5.11)
RDW: 14.3 % (ref 11.5–15.5)
WBC: 13.6 10*3/uL — AB (ref 4.0–10.5)
nRBC: 0 % (ref 0.0–0.2)

## 2018-11-04 MED ORDER — METHYLPREDNISOLONE SODIUM SUCC 125 MG IJ SOLR
60.0000 mg | Freq: Three times a day (TID) | INTRAMUSCULAR | Status: DC
  Administered 2018-11-04 – 2018-11-05 (×3): 60 mg via INTRAVENOUS
  Filled 2018-11-04 (×3): qty 2

## 2018-11-04 MED ORDER — ONDANSETRON HCL 4 MG/2ML IJ SOLN
4.0000 mg | Freq: Four times a day (QID) | INTRAMUSCULAR | Status: DC | PRN
  Administered 2018-11-04: 4 mg via INTRAVENOUS
  Filled 2018-11-04: qty 2

## 2018-11-04 MED ORDER — INSULIN ASPART 100 UNIT/ML ~~LOC~~ SOLN
0.0000 [IU] | Freq: Three times a day (TID) | SUBCUTANEOUS | Status: DC
  Administered 2018-11-04: 3 [IU] via SUBCUTANEOUS
  Administered 2018-11-04: 7 [IU] via SUBCUTANEOUS
  Administered 2018-11-05: 4 [IU] via SUBCUTANEOUS
  Administered 2018-11-05: 7 [IU] via SUBCUTANEOUS

## 2018-11-04 NOTE — Progress Notes (Signed)
   11/04/18 1430  Mobility  Activity Ambulated in hall;Ambulated to bathroom;Dangled on edge of bed  Range of Motion Active;All extremities  Level of Assistance Modified independent, requires aide device or extra time  Assistive Device Four wheel walker  Minutes Stood 7 minutes  Minutes Ambulated 7 minutes  Distance Ambulated (ft) 120 ft  Mobility Response Tolerated fair;RN notified (Very anxious/fearful of O2 dropping. Needed one seated rest)  Bed Position Semi-fowlers   SATURATION QUALIFICATIONS: (This note is used to comply with regulatory documentation for home oxygen)  Patient Saturations on Room Air at Rest = 91%  Patient Saturations on Room Air while Ambulating = 87%  Patient Saturations on 3 Liters of oxygen while Ambulating = 93%  Please briefly explain why patient needs home oxygen: Patient needed 3 liters to maintain a saturation of 93%>

## 2018-11-04 NOTE — Progress Notes (Addendum)
[facility] PROGRESS NOTE  Bianca Murray:[id number] DOB: [birth year] DOA: 10/28/2018 PCP: Medicine, [facility]  Assessment/Plan:  Acute on chronic hypoxic and hypercapnic respiratory failure Secondary to COPD. Slight improvement today.  Sats 96% on 3L.  Explained to patient need to keep sat between 89-92%. Patient continues to feel anxiety and reports worsening sob when oxygen weaned. Improved air movement with less wheeze today  -will wean solumedrol from 60mg  every 6hours to evey 8 hours - continue to wean oxygen -low dose xanax as needed -monitor  COPD exacerbation Chest x-ray with no acute abnormality. Hx of ESLD has been evaluated to lung transplant. Patient was unable to tolerate BiPAP.  notes from pulmonology reveal most recent visit 01/2017. PFT at that time with showed severe obstructive ventilatory defect, air trapping and mildly reduced diffusion capacity -see #1 - continue inhalers with Brovana and Pulmicort as patient unable to really take a deep breath. -Will continue Xopenex every 3 hours as needed.  -Continue Umeclidinium. -Continue flutter valve.  -will need OP pulm referral  Essential hypertension controlled. BP medications were held due to soft blood pressure during admission. - amlodipine and lisinopril resumed  Normocytic anemia Hemoglobin stable, outpatient follow-up recommended.  Hyperglycemia: likely related to steroids. Serum glucose 184 this am.  -will change  SSI to resistant for optimal control -continue to monitor  Code Status: full Family Communication:  Disposition Plan: home hopefully tomorrow   Consultants:    Procedures:    Antibiotics:    HPI/Subjective: Bianca Murray a63 year old female, recently moved from [place] to [facility] in February 2020, lives with her daughter and grandkids, independent, PMH of COPD/asthma, chronic hypoxic respiratory failure on home oxygen 3-4 L/min for the last 3 to  4 years, prior history of intubation/mechanical ventilation, reversed tracheostomy, former smoker quit 2 years ago, HTN, presented 10/28/18 with productive cough, progressively worsening wheezing and dyspnea. No recent travel to obvious areas with Covid 19 or contact with known Covid 19 positive case. Admitted for COPD exacerbation and acute on chronic hypoxic respiratory failure. Of note daughter reports patient has been evaluated for lung transplant in [place]. Have requested OV notes. Last visit 01/2018 with pulmonary function tests  Lying in bed easily aroused. No acute distress. Reports feels like "breathing a little better". Stated that "walk went well" and requests rollator walker.    Objective: Vitals:   11/04/18 0815 11/04/18 0820  BP:  128/84  Pulse:  82  Resp:  20  Temp:    SpO2: 97% 96%    Intake/Output Summary (Last 24 hours) at 11/04/2018 1145 Last data filed at 11/04/2018 0488 Gross per 24 hour  Intake 700 ml  Output -  Net 700 ml   Filed Weights   10/28/18 0156  Weight: 92.1 kg    Exam:   General:  Awake alert oriented x3 no acute distress  Cardiovascular: rrr no mgr no LE edema  Respiratory: no increased work of breathing with conversation. BS with slightly improved air movement and less wheezes  Abdomen: obese soft +BS no guarding or rebounding  Musculoskeletal: joints without swelling/erythema   Data Reviewed: Basic Metabolic Panel: Recent Labs  Lab 11/03/18 0241 11/04/18 0248  NA 135 138  K 3.9 4.2  CL 99 98  CO2 26 33*  GLUCOSE 165* 184*  BUN 19 19  CREATININE 0.81 0.80  CALCIUM 9.0 9.1   Liver Function Tests: Recent Labs  Lab 10/28/18 1255  AST 17  ALT 10  ALKPHOS 70  BILITOT  0.3  PROT 6.9  ALBUMIN 3.5   No results for input(s): LIPASE, AMYLASE in the last 168 hours. No results for input(s): AMMONIA in the last 168 hours. CBC: Recent Labs  Lab 11/04/18 0248  WBC 13.6*  HGB 10.8*  HCT 35.1*  MCV 90.9  PLT 308    Cardiac Enzymes: No results for input(s): CKTOTAL, CKMB, CKMBINDEX, TROPONINI in the last 168 hours. BNP (last 3 results) No results for input(s): BNP in the last 8760 hours.  ProBNP (last 3 results) No results for input(s): PROBNP in the last 8760 hours.  CBG: Recent Labs  Lab 11/03/18 1658 11/03/18 2104 11/04/18 0649 11/04/18 1118  GLUCAP 221* 194* 161* 209*    Recent Results (from the past 240 hour(s))  Respiratory Panel by PCR     Status: None   Collection Time: 10/28/18  4:14 AM  Result Value Ref Range Status   Adenovirus NOT DETECTED NOT DETECTED Final   Coronavirus 229E NOT DETECTED NOT DETECTED Final    Comment: (NOTE) The Coronavirus on the Respiratory Panel, DOES NOT test for the novel  Coronavirus (2019 nCoV)    Coronavirus HKU1 NOT DETECTED NOT DETECTED Final   Coronavirus NL63 NOT DETECTED NOT DETECTED Final   Coronavirus OC43 NOT DETECTED NOT DETECTED Final   Metapneumovirus NOT DETECTED NOT DETECTED Final   Rhinovirus / Enterovirus NOT DETECTED NOT DETECTED Final   Influenza A NOT DETECTED NOT DETECTED Final   Influenza B NOT DETECTED NOT DETECTED Final   Parainfluenza Virus 1 NOT DETECTED NOT DETECTED Final   Parainfluenza Virus 2 NOT DETECTED NOT DETECTED Final   Parainfluenza Virus 3 NOT DETECTED NOT DETECTED Final   Parainfluenza Virus 4 NOT DETECTED NOT DETECTED Final   Respiratory Syncytial Virus NOT DETECTED NOT DETECTED Final   Bordetella pertussis NOT DETECTED NOT DETECTED Final   Chlamydophila pneumoniae NOT DETECTED NOT DETECTED Final   Mycoplasma pneumoniae NOT DETECTED NOT DETECTED Final    Comment: Performed at [facility], [place] [address] [place], [place] [address]     Studies: No results found.  Scheduled Meds: . amLODipine  5 mg Oral Daily  . arformoterol  15 mcg Nebulization BID  . benzonatate  200 mg Oral TID  . budesonide (PULMICORT) nebulizer solution  0.5 mg Nebulization BID  . doxycycline  100 mg Oral Q12H  .  enoxaparin (LOVENOX) injection  40 mg Subcutaneous Daily  . folic acid  1 mg Oral Daily  . guaiFENesin-dextromethorphan  10 mL Oral Q6H  . insulin aspart  0-15 Units Subcutaneous TID WC  . insulin aspart  0-5 Units Subcutaneous QHS  . ipratropium-albuterol  3 mL Nebulization TID  . lisinopril  20 mg Oral Daily  . loratadine  10 mg Oral Daily  . mouth rinse  15 mL Mouth Rinse BID  . methylPREDNISolone (SOLU-MEDROL) injection  60 mg Intravenous Q8H   Continuous Infusions:  Principal Problem:   Acute on chronic respiratory failure with hypoxia and hypercapnia (HCC) Active Problems:   COPD with acute exacerbation (HCC)   COPD (chronic obstructive pulmonary disease) (HCC)   Hyperglycemia   Chronic respiratory failure with hypoxia and hypercapnia ([place])    Time spent: 45 minutes    [place] NP  [facility]  If 7PM-7AM, please contact night-coverage at [email or web address], password [facility] 11/04/2018, 11:45 AM  LOS: 7 days        Patient was seen, examined,treatment plan was discussed with the Advance Practice Provider.  I have personally reviewed  the clinical findings, labs, EKG, imaging studies and management of this patient in detail. I have also reviewed the orders written for this patient which were under my direction. I agree with the documentation, as recorded by the Advance Practice Provider.   Bianca Murray is a 63 y.o. female here with SOB.  Has end stage lung disease.  Wean steroids off slowly.  Wean O2 as well.  At rest may need less than when walking. Lungs severely diminished but not wheezing.  Suspect she is getting close to her baseline.  Will need outpatient referral to Pulmonology.     Geradine Girt, DO   How to contact the [facility] Attending or Consulting provider [place] or covering provider during after hours [place], for this patient?  1. Check the care team in [facility] and look for a) attending/consulting [facility] provider listed and b) the [facility] team listed 2. Log into  [email or web address] and use 's universal password to access. If you do not have the password, please contact the hospital operator. 3. Locate the [facility] provider you are looking for under [facility] and page to a number that you can be directly reached. 4. If you still have difficulty reaching the provider, please page the [facility] (Director on Call) for the Hospitalists listed on amion for assistance.

## 2018-11-04 NOTE — TOC Progression Note (Signed)
Transition of Care (TOC) - Progression Note  Marvetta Gibbons RN, BSN Transitions of Care Unit 4E- RN Case Manager [phone]  Patient Details  Name: Bianca Murray MRN: [id number] Date of Birth: 05/28/1956  Transition of [facility]) CM/SW Contact  Marvetta Gibbons [place], [place] Phone Number: [phone] 11/04/2018, 3:02 PM  Clinical Narrative:    Pt admitted with COPD, recently moved here to be with daughter from [place], had home 02 but needs to be established here- MD needs to place order for home 02, orders already placed for rollator, 3n1 DME and HHPT. CM discussed DME and HH- list provided to pt- Per CMS guidelines from medicare.gov website with star ratings (copy placed in shadow chart) for review and choice- CM will f/u- per pt's daughter she does not need 3n1- only rollator.    Expected Discharge Plan: [place] Barriers to Discharge: Continued Medical Work up  Expected Discharge Plan and Services Expected Discharge Plan: [place]   Discharge Planning Services: CM Consult Post Acute Care Choice: Durable Medical Equipment, Home Health Living arrangements for the past 2 months: Single Family Home                 DME Arranged: 3-N-1, Walker rolling with seat, Oxygen DME Agency: [employer] HH Arranged: PT     Social Determinants of Health (SDOH) Interventions    Readmission Risk Interventions No flowsheet data found.

## 2018-11-04 NOTE — Progress Notes (Signed)
PT Cancellation Note  Patient Details Name: Bianca Murray MRN: [id number] DOB: 1956/05/30   Cancelled Treatment:    Reason Eval/Treat Not Completed: Patient declined x 2 stating she didn't feel up to it despite verbal encouragement. When attempted 3rd time pt amb with mobility tech.   Shary Decamp [facility] 11/04/2018, 2:58 PM  [place] Pager [phone] Office [phone]

## 2018-11-05 LAB — GLUCOSE, CAPILLARY
GLUCOSE-CAPILLARY: 218 mg/dL — AB (ref 70–99)
Glucose-Capillary: 167 mg/dL — ABNORMAL HIGH (ref 70–99)

## 2018-11-05 MED ORDER — LISINOPRIL 20 MG PO TABS: 20.0000 mg | ORAL_TABLET | Freq: Every day | ORAL | 0 refills | Status: DC

## 2018-11-05 MED ORDER — PREDNISONE 20 MG PO TABS: ORAL_TABLET | ORAL | 0 refills | Status: DC

## 2018-11-05 MED ORDER — PANTOPRAZOLE SODIUM 40 MG PO TBEC: 40.0000 mg | DELAYED_RELEASE_TABLET | Freq: Every day | ORAL | 0 refills | Status: DC

## 2018-11-05 MED ORDER — BUDESONIDE 0.5 MG/2ML IN SUSP: 0.5000 mg | Freq: Two times a day (BID) | RESPIRATORY_TRACT | 0 refills | Status: DC

## 2018-11-05 MED ORDER — PREDNISONE 20 MG PO TABS: 40.0000 mg | ORAL_TABLET | Freq: Every day | ORAL | Status: DC

## 2018-11-05 MED ORDER — IPRATROPIUM-ALBUTEROL 0.5-2.5 (3) MG/3ML IN SOLN: 3.0000 mL | Freq: Three times a day (TID) | RESPIRATORY_TRACT | 0 refills | Status: DC

## 2018-11-05 MED ORDER — AMLODIPINE BESYLATE 5 MG PO TABS: 5.0000 mg | ORAL_TABLET | Freq: Every day | ORAL | 0 refills | Status: DC

## 2018-11-05 MED ORDER — ARFORMOTEROL TARTRATE 15 MCG/2ML IN NEBU: 15.0000 ug | INHALATION_SOLUTION | Freq: Two times a day (BID) | RESPIRATORY_TRACT | 0 refills | Status: DC

## 2018-11-05 NOTE — Progress Notes (Signed)
Pt discharged home with daughter. Pt received discharge instructions and all questions were answered. IV and telemetry box removed. Pt discharged with all of her belongings, including nebulizer machine, home oxygen, and rollator. Pt discharged via wheelchair and was accompanied by [facility] staff.  Ara Kussmaul BSN, RN

## 2018-11-05 NOTE — Progress Notes (Signed)
Occupational Therapy Treatment Patient Details Name: Bianca Murray MRN: [id number] DOB: 1955-09-02 Today's Date: 11/05/2018    History of present illness 63 y/o female admitted secondary to worsening SOB likely from COPD exacerbation. PMH includes COPD on home O2, asthma, HTN, cancer, and tracheostomy s/p reversal.    OT comments  Pt progressing towards OT goals. Pt just completed walking with PT and eager for dc - session focused on functional dressing, transfers, and review of energy conservation and 3 in 1 uses. Pt overall min guard for LB ADL and supervision for transfers. Set up for UB dressing. Pt verbalized understanding of energy conservation and 3 in 1 uses. Pt set to dc home later today. Thank you for the opportunity to serve this patient.    Follow Up Recommendations  No OT follow up;Supervision - Intermittent    Equipment Recommendations  3 in 1 bedside commode    Recommendations for Other Services      Precautions / Restrictions Precautions Precautions: Fall Precaution Comments: O2 at all times Restrictions Weight Bearing Restrictions: No       Mobility Bed Mobility Overal bed mobility: Modified Independent                Transfers Overall transfer level: Needs assistance Equipment used: 4-wheeled walker Transfers: Sit to/from Stand Sit to Stand: Supervision         General transfer comment: supervision for safety - cueing for brakes at rollator    Balance Overall balance assessment: Needs assistance Sitting-balance support: Feet supported Sitting balance-Leahy Scale: Good     Standing balance support: Bilateral upper extremity supported;During functional activity Standing balance-Leahy Scale: Fair                             ADL either performed or assessed with clinical judgement   ADL Overall ADL's : Needs assistance/impaired                 Upper Body Dressing : Min guard;Standing;Sitting Upper Body Dressing Details  (indicate cue type and reason): to don shirt Lower Body Dressing: Min guard;Sit to/from stand Lower Body Dressing Details (indicate cue type and reason): to don pants, socks, shoes Toilet Transfer: Supervision/safety;Ambulation;Regular Toilet   Toileting- Water quality scientist and Hygiene: Supervision/safety;Sit to/from stand       Functional mobility during ADLs: Supervision/safety       Vision       Perception     Praxis      Cognition Arousal/Alertness: Awake/alert Behavior During Therapy: WFL for tasks assessed/performed Overall Cognitive Status: Within Functional Limits for tasks assessed                                          Exercises     Shoulder Instructions       General Comments reviewed energy conservation and 3 in 1 uses    Pertinent Vitals/ Pain       Pain Assessment: No/denies pain  Home Living                                          Prior Functioning/Environment              Frequency  Min 2X/week  Progress Toward Goals  OT Goals(current goals can now be found in the care plan section)  Progress towards OT goals: Progressing toward goals  Acute Rehab OT Goals Patient Stated Goal: to go home  OT Goal Formulation: With patient Time For Goal Achievement: 11/11/18 Potential to Achieve Goals: Good  Plan Discharge plan remains appropriate    Co-evaluation                 AM-PAC OT "6 Clicks" Daily Activity     Outcome Measure   Help from another person eating meals?: None Help from another person taking care of personal grooming?: A Little Help from another person toileting, which includes using toliet, bedpan, or urinal?: A Little Help from another person bathing (including washing, rinsing, drying)?: A Little Help from another person to put on and taking off regular upper body clothing?: None Help from another person to put on and taking off regular lower body clothing?: A  Little 6 Click Score: 20    End of Session Equipment Utilized During Treatment: Oxygen(4L)  OT Visit Diagnosis: Unsteadiness on feet (R26.81);Other abnormalities of gait and mobility (R26.89);Muscle weakness (generalized) (M62.81);Other symptoms and signs involving cognitive function   Activity Tolerance Patient tolerated treatment well   Patient Left in chair;with call bell/phone within reach   Nurse Communication Mobility status        Time: 5053-9767 OT Time Calculation (min): 29 min  Charges: OT General Charges $OT Visit: 1 Visit OT Treatments $Self Care/Home Management : 8-22 mins  Hulda Humphrey OTR/L Acute Rehabilitation Services Pager: [phone] Office: [place] 11/05/2018, 2:41 PM

## 2018-11-05 NOTE — Progress Notes (Signed)
Physical Therapy Treatment Patient Details Name: Bianca Murray MRN: [id number] DOB: 1956-04-01 Today's Date: 11/05/2018    History of Present Illness 63 y/o female admitted secondary to worsening SOB likely from COPD exacerbation. PMH includes COPD on home O2, asthma, HTN, cancer, and tracheostomy s/p reversal.     PT Comments    Patient seen for mobility progression. Use of 4L supplemental O2 as well as rollator at min guard to supervision level for safety - no LOB or need for physical assistance. Discussed safety precautions with rollator during mobility training with good verbal understanding and carryover. Patient very motivated to return home. Will continue to follow.     Follow Up Recommendations  Home health PT;Supervision for mobility/OOB     Equipment Recommendations  3in1 (PT);Other (comment)(rollator)    Recommendations for Other Services       Precautions / Restrictions Precautions Precautions: Fall Precaution Comments: O2 at all times Restrictions Weight Bearing Restrictions: No    Mobility  Bed Mobility Overal bed mobility: Modified Independent                Transfers Overall transfer level: Needs assistance Equipment used: 4-wheeled walker Transfers: Sit to/from Stand Sit to Stand: Supervision         General transfer comment: supervision for safety - cueing for brakes at rollator  Ambulation/Gait Ambulation/Gait assistance: Min guard;Supervision Gait Distance (Feet): 140 Feet Assistive device: 4-wheeled walker Gait Pattern/deviations: Step-through pattern;Decreased stride length;Trunk flexed Gait velocity: Decreased    General Gait Details: use of 4L O2 for mobility; required 1 standing rest break; VSS; reviewed safety with rollaotr during mobility training   Stairs             Wheelchair Mobility    Modified Rankin (Stroke Patients Only)       Balance Overall balance assessment: Needs assistance Sitting-balance support:  Feet supported Sitting balance-Leahy Scale: Good     Standing balance support: Bilateral upper extremity supported;During functional activity Standing balance-Leahy Scale: Fair                              Cognition Arousal/Alertness: Awake/alert Behavior During Therapy: WFL for tasks assessed/performed Overall Cognitive Status: Within Functional Limits for tasks assessed                                        Exercises      General Comments        Pertinent Vitals/Pain Pain Assessment: No/denies pain    Home Living                      Prior Function            PT Goals (current goals can now be found in the care plan section) Acute Rehab PT Goals Patient Stated Goal: to go home  PT Goal Formulation: With patient Time For Goal Achievement: 11/11/18 Potential to Achieve Goals: Good Progress towards PT goals: Progressing toward goals    Frequency    Min 3X/week      PT Plan Current plan remains appropriate;Equipment recommendations need to be updated    Co-evaluation              AM-PAC PT "6 Clicks" Mobility   Outcome Measure  Help needed turning from your back to your side while in a flat bed  without using bedrails?: None Help needed moving from lying on your back to sitting on the side of a flat bed without using bedrails?: None Help needed moving to and from a bed to a chair (including a wheelchair)?: A Little Help needed standing up from a chair using your arms (e.g., wheelchair or bedside chair)?: A Little Help needed to walk in hospital room?: A Little Help needed climbing 3-5 steps with a railing? : A Lot 6 Click Score: 19    End of Session Equipment Utilized During Treatment: Oxygen;Gait belt Activity Tolerance: Patient tolerated treatment well Patient left: in bed;with call bell/phone within reach Nurse Communication: Mobility status PT Visit Diagnosis: Unsteadiness on feet (R26.81);Muscle weakness  (generalized) (M62.81)     Time: 7564-3329 PT Time Calculation (min) (ACUTE ONLY): 15 min  Charges:  $Gait Training: 8-22 mins                     Lanney Gins, PT, DPT Supplemental Physical Therapist 11/05/18 11:50 AM Pager: [phone] Office: [phone]

## 2018-11-05 NOTE — Discharge Instructions (Signed)
Can use 2L of O2 at rest but increase to 3-4L with exertion-- keep O2 sats around 92%

## 2018-11-05 NOTE — TOC Transition Note (Addendum)
Transition of Care [facility]) - CM/SW Discharge Note Marvetta Gibbons RN, BSN Transitions of Care Unit 4E- RN Case Manager [phone]  Patient Details  Name: Bianca Murray MRN: [id number] Date of Birth: 1956/07/20  Transition of Care [facility]) CM/SW Contact:  Dawayne Patricia, RN Phone Number: 11/05/2018, 10:21 AM   Clinical Narrative:    See previous note for hx. Pt stable for transition home. DME has been ordered to include, rollator, and home 02, nebulizer (pt declines 3n1)- call made to [facility] with [place] for DME needs, DME to be delivered to room prior to discharge including portable 02 tank for transport home. Pt has list per CMS for [facility] agency has not made choice at this time- and request to take list home to discuss with daughter, CM has provided pt with contact name and phone # to call back later today with pt's choice for [facility] agency so that referral can be made for HHPT.  Update 1230- daughter on phone with pt at bedside- was able to f/u via TC with pt and daughter regarding [facility] agency choice- per daughter would like CM to check with [facility] first for [facility] needs and if unable to take referral to to just go with any of the 4 star rating agencies on list as preferences. Pt agreeable to this. DME as been delivered to room. Call made to [facility] with [facility] for [place] referral- [facility] has accepted [facility] referral and will f/u with pt for start of care.   Final next level of care: [place] Barriers to Discharge: No Barriers Identified   Patient Goals and CMS Choice Patient states their goals for this hospitalization and ongoing recovery are:: "I want to exercise more and be more active" CMS Medicare.gov Compare Post Acute Care list provided to:: Patient Choice offered to / list presented to : Patient, Adult Children  Discharge Placement  Home with [facility]- pending choice and referral-                      Discharge Plan and Services   Discharge Planning Services: CM  Consult Post Acute Care Choice: Durable Medical Equipment, Home Health          DME Arranged: 3-N-1, Walker rolling with seat, Oxygen DME Agency: [facility] HH Arranged: PT   [place]  Social Determinants of Health (SDOH) Interventions     Readmission Risk Interventions Readmission Risk Prevention Plan 11/05/2018  Post Dischage Appt Complete  Medication Screening Complete  Transportation Screening Complete

## 2018-11-05 NOTE — Discharge Summary (Addendum)
Physician Discharge Summary  Bianca Murray NID:[id number] DOB: 02/07/1956 DOA: 10/28/2018  PCP: [facility]  Admit date: 10/28/2018 Discharge date: 11/05/2018  Admitted From: home Discharge disposition: home   Recommendations for Outpatient Follow-Up:   1. Referral to pulmonary MD 2. Resume home O2   Discharge Diagnosis:   Principal Problem:   Acute on chronic respiratory failure with hypoxia and hypercapnia (HCC) Active Problems:   COPD with acute exacerbation (HCC)   COPD (chronic obstructive pulmonary disease) (HCC)   Chronic respiratory failure with hypoxia and hypercapnia (HCC)   Hyperglycemia    Discharge Condition: Improved.  Diet recommendation: Low sodium, heart healthy.  Carbohydrate-modified  Wound care: None.  Code status: Full.   History of Present Illness:   Bianca Murray is a 63 y.o. female with medical history significant of O2 dependent COPD, Asthma.  She has required intubation and tracheostomy in the past (though this has been reversed).  She moved down to the area a few weeks ago from [place].  No known sick contacts.  For the past 4-5 days she has had increased SOB, cough, wheezing.  This has been gradually worsening since onset.  She is on 3-4L O2 at baseline.   Hospital Course by Problem:   Acute on chronic hypoxic and hypercapnic respiratory failure Secondary to COPD. Slight improvement today.  Sats 96% on 3L.  Explained to patient need to keep sat between 89-92%.Patient continues to feel anxiety and reports worsening sob when oxygen weaned. Improved air movement with less wheeze today  -will wean solumedrol from 60mg  every 6hours to evey 8 hours - continue to wean oxygen -may need  COPD exacerbation Chestx-ray with no acute abnormality. Hx of ESLD has been evaluated to lung transplant.Patient was unable to tolerate BiPAP.notes from pulmonology reveal most recent visit 01/2017. PFT at that time with showed  severe obstructive ventilatory defect, air trapping and mildly reduced diffusion capacity - continuenebs with Brovana and Pulmicort  -will need OP pulm referral  Essential hypertension -resume home meds  Normocytic anemia Hemoglobin stable, outpatient follow-up recommended.  Hyperglycemia: likely related to steroids -carb mod diet   Medical Consultants:      Discharge Exam:   Vitals:   11/05/18 0806 11/05/18 0820  BP:  124/79  Pulse:  71  Resp: 16 17  Temp:  98 F (36.7 C)  SpO2: 97% 96%   Vitals:   11/04/18 2021 11/05/18 0015 11/05/18 0806 11/05/18 0820  BP:  111/79  124/79  Pulse:  80  71  Resp:  19 16 17   Temp:  (!) 97.5 F (36.4 C)  98 F (36.7 C)  TempSrc:  Oral  Oral  SpO2: 96% 99% 97% 96%  Weight:      Height:        General exam: Appears calm and comfortable.  No wheezing, not moving much air  The results of significant diagnostics from this hospitalization (including imaging, microbiology, ancillary and laboratory) are listed below for reference.     Procedures and Diagnostic Studies:   Dg Chest 2 View  Result Date: 10/29/2018 CLINICAL DATA:  Cough and shortness of breath for 2 days EXAM: CHEST - 2 VIEW COMPARISON:  10/22/2018 FINDINGS: Cardiac shadow is stable when compared with the previous day. The lungs are well aerated without focal infiltrate or sizable effusion. No bony abnormality is noted. IMPRESSION: No acute abnormality seen. Electronically Signed   By: Inez Catalina M.D.   On: 10/29/2018 09:05  Dg Chest Port 1 View  Result Date: 10/28/2018 CLINICAL DATA:  Patient c/o SOB w/ productive cough x5-6 days. Hx of intubation and trach. NAD noted at this time EXAM: PORTABLE CHEST 1 VIEW COMPARISON:  None. FINDINGS: Cardiac silhouette is normal in size. No mediastinal or hilar masses. No evidence of adenopathy. Lungs are hyperexpanded. There is relative lucency in the upper lobes consistent with emphysema. Linear opacities noted in the right  mid lung along the minor fissure and left mid lung, consistent with scarring or atelectasis. Lungs are otherwise clear. No pleural effusion or pneumothorax. Skeletal structures are grossly intact. IMPRESSION: 1. No acute cardiopulmonary disease. 2. COPD/emphysema. Electronically Signed   By: Lajean Manes M.D.   On: 10/28/2018 02:38     Labs:   Basic Metabolic Panel: Recent Labs  Lab 11/03/18 0241 11/04/18 0248  NA 135 138  K 3.9 4.2  CL 99 98  CO2 26 33*  GLUCOSE 165* 184*  BUN 19 19  CREATININE 0.81 0.80  CALCIUM 9.0 9.1   GFR Estimated Creatinine Clearance: 76 mL/min (by C-G formula based on SCr of 0.8 mg/dL). Liver Function Tests: No results for input(s): AST, ALT, ALKPHOS, BILITOT, PROT, ALBUMIN in the last 168 hours. No results for input(s): LIPASE, AMYLASE in the last 168 hours. No results for input(s): AMMONIA in the last 168 hours. Coagulation profile No results for input(s): INR, PROTIME in the last 168 hours.  CBC: Recent Labs  Lab 11/04/18 0248  WBC 13.6*  HGB 10.8*  HCT 35.1*  MCV 90.9  PLT 308   Cardiac Enzymes: No results for input(s): CKTOTAL, CKMB, CKMBINDEX, TROPONINI in the last 168 hours. BNP: Invalid input(s): POCBNP CBG: Recent Labs  Lab 11/04/18 0649 11/04/18 1118 11/04/18 1619 11/04/18 2130 11/05/18 0605  GLUCAP 161* 209* 132* 236* 167*   D-Dimer No results for input(s): DDIMER in the last 72 hours. Hgb A1c No results for input(s): HGBA1C in the last 72 hours. Lipid Profile No results for input(s): CHOL, HDL, LDLCALC, TRIG, CHOLHDL, LDLDIRECT in the last 72 hours. Thyroid function studies No results for input(s): TSH, T4TOTAL, T3FREE, THYROIDAB in the last 72 hours.  Invalid input(s): FREET3 Anemia work up No results for input(s): VITAMINB12, FOLATE, FERRITIN, TIBC, IRON, RETICCTPCT in the last 72 hours. Microbiology Recent Results (from the past 240 hour(s))  Respiratory Panel by PCR     Status: None   Collection Time:  10/28/18  4:14 AM  Result Value Ref Range Status   Adenovirus NOT DETECTED NOT DETECTED Final   Coronavirus 229E NOT DETECTED NOT DETECTED Final    Comment: (NOTE) The Coronavirus on the Respiratory Panel, DOES NOT test for the novel  Coronavirus (2019 nCoV)    Coronavirus HKU1 NOT DETECTED NOT DETECTED Final   Coronavirus NL63 NOT DETECTED NOT DETECTED Final   Coronavirus OC43 NOT DETECTED NOT DETECTED Final   Metapneumovirus NOT DETECTED NOT DETECTED Final   Rhinovirus / Enterovirus NOT DETECTED NOT DETECTED Final   Influenza A NOT DETECTED NOT DETECTED Final   Influenza B NOT DETECTED NOT DETECTED Final   Parainfluenza Virus 1 NOT DETECTED NOT DETECTED Final   Parainfluenza Virus 2 NOT DETECTED NOT DETECTED Final   Parainfluenza Virus 3 NOT DETECTED NOT DETECTED Final   Parainfluenza Virus 4 NOT DETECTED NOT DETECTED Final   Respiratory Syncytial Virus NOT DETECTED NOT DETECTED Final   Bordetella pertussis NOT DETECTED NOT DETECTED Final   Chlamydophila pneumoniae NOT DETECTED NOT DETECTED Final   Mycoplasma pneumoniae NOT  DETECTED NOT DETECTED Final    Comment: Performed at [facility], [place] [address] [place], [place] [address]     Discharge Instructions:   Discharge Instructions    Diet - low sodium heart healthy   Complete by:  As directed    Diet Carb Modified   Complete by:  As directed    Increase activity slowly   Complete by:  As directed      Allergies as of 11/05/2018   No Known Allergies     Medication List    STOP taking these medications   Anoro Ellipta 62.5-25 MCG/INH Aepb Generic drug:  umeclidinium-vilanterol     TAKE these medications   albuterol 108 (90 Base) MCG/ACT inhaler Commonly known as:  PROVENTIL HFA;VENTOLIN HFA Inhale 1-2 puffs into the lungs every 6 (six) hours as needed for wheezing or shortness of breath.   amLODipine 5 MG tablet Commonly known as:  NORVASC Take 1 tablet (5 mg total) by mouth daily. What changed:     medication strength  how much to take   arformoterol 15 MCG/2ML Nebu Commonly known as:  BROVANA Take 2 mLs (15 mcg total) by nebulization 2 (two) times daily.   budesonide 0.5 MG/2ML nebulizer solution Commonly known as:  PULMICORT Take 2 mLs (0.5 mg total) by nebulization 2 (two) times daily.   folic acid 1 MG tablet Commonly known as:  FOLVITE Take 1 mg by mouth daily.   ipratropium-albuterol 0.5-2.5 (3) MG/3ML Soln Commonly known as:  DUONEB Take 3 mLs by nebulization 3 (three) times daily.   lisinopril 20 MG tablet Commonly known as:  PRINIVIL,ZESTRIL Take 1 tablet (20 mg total) by mouth daily. What changed:  how much to take   loratadine 10 MG tablet Commonly known as:  CLARITIN Take 10 mg by mouth daily.   predniSONE 20 MG tablet Commonly known as:  DELTASONE 40 mg x 4 days then 30 mg x 4 days then 20 mg x 4 days then 10 mg x 4 days then stop   Vitamin D (Ergocalciferol) 1.25 MG (50000 UT) Caps capsule Commonly known as:  DRISDOL Take 50,000 Units by mouth every 7 (seven) days.            Durable Medical Equipment  (From admission, onward)         Start     Ordered   11/05/18 0947  For home use only DME Nebulizer machine  Once    Question:  Patient needs a nebulizer to treat with the following condition  Answer:  COPD (chronic obstructive pulmonary disease) ([place])   11/05/18 3810   11/05/18 0927  For home use only DME oxygen  Once    Question Answer Comment  Mode or (Route) Nasal cannula   Liters per Minute 2   Frequency Continuous (stationary and portable oxygen unit needed)   Oxygen delivery system Gas      11/05/18 0926   11/04/18 0903  For home use only DME 4 wheeled rolling walker with seat  Once    Question:  Patient needs a walker to treat with the following condition  Answer:  Respiratory failure, acute-on-chronic ([place])   11/04/18 0906   10/31/18 0735  For home use only DME 3 n 1  Once     10/31/18 0734         Follow-up Information     [facility] Follow up.   Why:  [facility] Contact information: [place]  [place] [address] [phone]            Time coordinating discharge: 35 min  Signed:  Geradine Girt DO  [facility] 11/05/2018, 9:47 AM

## 2018-11-26 ENCOUNTER — Encounter: Payer: Self-pay | Admitting: Critical Care Medicine

## 2018-11-27 ENCOUNTER — Telehealth: Payer: Self-pay

## 2018-11-27 ENCOUNTER — Encounter: Payer: Self-pay | Admitting: Critical Care Medicine

## 2018-11-27 ENCOUNTER — Other Ambulatory Visit: Payer: Self-pay

## 2018-11-27 ENCOUNTER — Ambulatory Visit: Payer: Medicaid Other | Attending: Critical Care Medicine | Admitting: Critical Care Medicine

## 2018-11-27 DIAGNOSIS — J9612 Chronic respiratory failure with hypercapnia: Secondary | ICD-10-CM

## 2018-11-27 DIAGNOSIS — D649 Anemia, unspecified: Secondary | ICD-10-CM | POA: Diagnosis not present

## 2018-11-27 DIAGNOSIS — Z9981 Dependence on supplemental oxygen: Secondary | ICD-10-CM | POA: Insufficient documentation

## 2018-11-27 DIAGNOSIS — R739 Hyperglycemia, unspecified: Secondary | ICD-10-CM | POA: Diagnosis not present

## 2018-11-27 DIAGNOSIS — E559 Vitamin D deficiency, unspecified: Secondary | ICD-10-CM | POA: Diagnosis not present

## 2018-11-27 DIAGNOSIS — J9611 Chronic respiratory failure with hypoxia: Secondary | ICD-10-CM

## 2018-11-27 DIAGNOSIS — I10 Essential (primary) hypertension: Secondary | ICD-10-CM | POA: Diagnosis not present

## 2018-11-27 DIAGNOSIS — Z8542 Personal history of malignant neoplasm of other parts of uterus: Secondary | ICD-10-CM | POA: Diagnosis not present

## 2018-11-27 DIAGNOSIS — J9621 Acute and chronic respiratory failure with hypoxia: Secondary | ICD-10-CM | POA: Diagnosis not present

## 2018-11-27 DIAGNOSIS — J9622 Acute and chronic respiratory failure with hypercapnia: Secondary | ICD-10-CM | POA: Insufficient documentation

## 2018-11-27 DIAGNOSIS — J441 Chronic obstructive pulmonary disease with (acute) exacerbation: Secondary | ICD-10-CM | POA: Diagnosis present

## 2018-11-27 MED ORDER — ALBUTEROL SULFATE (2.5 MG/3ML) 0.083% IN NEBU: 2.5000 mg | INHALATION_SOLUTION | Freq: Three times a day (TID) | RESPIRATORY_TRACT | 1 refills | Status: DC

## 2018-11-27 MED ORDER — BLOOD PRESSURE MONITOR AUTOMAT DEVI: 0 refills | Status: DC

## 2018-11-27 MED ORDER — VITAMIN D (ERGOCALCIFEROL) 1.25 MG (50000 UNIT) PO CAPS: 50000.0000 [IU] | ORAL_CAPSULE | ORAL | 2 refills | Status: DC

## 2018-11-27 MED ORDER — UMECLIDINIUM-VILANTEROL 62.5-25 MCG/INH IN AEPB: 1.0000 | INHALATION_SPRAY | Freq: Every day | RESPIRATORY_TRACT | 3 refills | Status: DC

## 2018-11-27 MED ORDER — PULSE OXIMETER FOR FINGER MISC: 1.0000 "application " | Freq: Every day | 0 refills | Status: DC | PRN

## 2018-11-27 MED ORDER — LORATADINE 10 MG PO TABS: 10.0000 mg | ORAL_TABLET | Freq: Every day | ORAL | 3 refills | Status: DC

## 2018-11-27 MED ORDER — ALBUTEROL SULFATE HFA 108 (90 BASE) MCG/ACT IN AERS: 1.0000 | INHALATION_SPRAY | Freq: Four times a day (QID) | RESPIRATORY_TRACT | 3 refills | Status: DC | PRN

## 2018-11-27 MED ORDER — BUDESONIDE 0.5 MG/2ML IN SUSP: 0.5000 mg | Freq: Two times a day (BID) | RESPIRATORY_TRACT | 1 refills | Status: DC

## 2018-11-27 MED ORDER — LISINOPRIL 20 MG PO TABS: 20.0000 mg | ORAL_TABLET | Freq: Every day | ORAL | 3 refills | Status: DC

## 2018-11-27 MED ORDER — AMLODIPINE BESYLATE 5 MG PO TABS: 5.0000 mg | ORAL_TABLET | Freq: Every day | ORAL | 3 refills | Status: DC

## 2018-11-27 NOTE — Telephone Encounter (Signed)
Opened in error

## 2018-11-27 NOTE — Addendum Note (Signed)
Addended by: Asencion Noble E on: 11/27/2018 03:06 PM   Modules accepted: Orders

## 2018-11-27 NOTE — Progress Notes (Signed)
Worked up patient for their televisit with Dr. Joya Gaskins. Patient states that her breathing has been "shaky" since getting home. Has been out of her Anoro inhaler & her nebulizer solution(Brovana & Pulmicort) for a weeks.

## 2018-11-27 NOTE — Progress Notes (Signed)
Virtual Visit via Telephone Note  I connected with Bianca Murray on 11/27/18 at  1:30 PM EDT by telephone and verified that I am speaking with the correct person using two identifiers.   Consent:  I discussed the limitations, risks, security and privacy concerns of performing an evaluation and management service by telephone and the availability of in person appointments. I also discussed with the patient that there may be a patient responsible charge related to this service. The patient expressed understanding and agreed to proceed.  Location of patient: at daughter's home   Location of provider: In [place] participating in the televisit with the patient.  The patients daughter Bianca Murray   History of Present Illness: This is a telephone visit that I had with the patient and the daughter and I confirm they were the only ones on the phone at the time This is a 63 year old female with advanced chronic obstructive lung disease.  Pulmonary functions have been obtained earlier in 2019 by her local pulmonologist in [facility].  This revealed an FEV1 of 25% predicted diffusion capacity of 20% predicted.  This places the patient in the Gold stage D COPD category with significant advanced emphysema. The patient did remove recently from her home in [place] to be closer to her daughter and is now staying in her daughter's home in this move occurred in January 2020.  Since that time the patient began having viral symptoms of an upper respiratory tract infection and was admitted to the hospital on March 17 being discharged on the 25th.  Below are excerpts from the discharge summary  Admit date: 10/28/2018 Discharge date: 11/05/2018  Admitted From: home Discharge disposition: home   Recommendations for Outpatient Follow-Up:   1. Referral to pulmonary MD 2. Resume home O2   Discharge Diagnosis:   Principal Problem:   Acute on chronic respiratory failure with hypoxia and  hypercapnia (HCC) Active Problems:   COPD with acute exacerbation (HCC)   COPD (chronic obstructive pulmonary disease) (HCC)   Chronic respiratory failure with hypoxia and hypercapnia (HCC)   Hyperglycemia    Discharge Condition: Improved.  Diet recommendation: Low sodium, heart healthy.  Carbohydrate-modified  Wound care: None.  Code status: Full.   History of Present Illness:   Bianca Murray a 62 y.o.femalewith medical history significant ofO2 dependent COPD, Asthma. She has required intubation and tracheostomy in the past (though this has been reversed). She moved down to the area a few weeks ago from [place]. No known sick contacts. For the past 4-5 days she has had increased SOB, cough, wheezing. This has been gradually worsening since onset. She is on 3-4L O2 at baseline.   Hospital Course by Problem:   Acute on chronic hypoxic and hypercapnic respiratory failure Secondary to COPD.Slight improvement today.Sats 96%on 3L.Explained to patient needto keep sat between 89-92%.Patientcontinues to feel anxietyand reports worsening sob when oxygen weaned. Improved air movement with less wheeze today -will weansolumedrol from 60mg  every 6hours to evey 8 hours -continue towean oxygen -may need  COPD exacerbation Chestx-ray with no acute abnormality. Hx of ESLD has been evaluated to lung transplant.Patient was unable to tolerate BiPAP.notes from pulmonology reveal most recent visit 01/2017. PFT at that time with showed severe obstructive ventilatory defect, air trapping and mildly reduced diffusion capacity - continuenebs with Brovana and Pulmicort  -will need OP pulm referral  Essential hypertension -resume home meds  Normocytic anemia Hemoglobin stable, outpatient follow-up recommended.  Hyperglycemia: likely related to steroids -carb mod diet  Note the patient is on oxygen 3 L continuous and has a portable system from [facility].  Her saturations have been in the mid 90% range.  The patient states her shortness of breath has slightly worsened over the last 2 weeks because she ran out of her Anoro inhaler and also was not able to obtain the [place].  The patient denies any chest pain.  There is no nocturnal shortness of breath.  No lower extremity edema.  There is a minimal productive cough of white mucus.  There is no hemoptysis.  She has no COVID exposure.  There is a visiting nurse at this time.  She does not have a blood pressure cuff in the home.  There is history of uterine cancer status post total abdominal hysterectomy.   Pos in BOLD Constitutional:   No  weight loss, night sweats,  Fevers, chills, fatigue, lassitude. HEENT:   No headaches,  Difficulty swallowing,  Tooth/dental problems,  Sore throat,                No sneezing, itching, ear ache, nasal congestion, post nasal drip,   CV:  No chest pain,  Orthopnea, PND, swelling in lower extremities, anasarca, dizziness, palpitations  GI  No heartburn, indigestion, abdominal pain, nausea, vomiting, diarrhea, change in bowel habits, loss of appetite  Resp:  shortness of breath with exertion or at rest.  No excess mucus, no productive cough,  No non-productive cough,  No coughing up of blood.  No change in color of mucus.  No wheezing.  No chest wall deformity  Skin: no rash or lesions.  GU: no dysuria, change in color of urine, no urgency or frequency.  No flank pain.  MS:  No joint pain or swelling.  No decreased range of motion.  No back pain.  Psych:  No change in mood or affect. No depression or anxiety.  No memory loss.  Observations/Objective: This is a telephone visit so no physical exam or other observations could be made CXR 3/18: NAD RVP: Neg  FLu A/B neg  BMP Latest Ref Rng & Units 11/04/2018 11/03/2018 10/28/2018  Glucose 70 - 99 mg/dL 184(H) 165(H) -  BUN 8 - 23 mg/dL 19 19 -  Creatinine 0.44 - 1.00 mg/dL 0.80 0.81 -  Sodium 135 -  145 mmol/L 138 135 139  Potassium 3.5 - 5.1 mmol/L 4.2 3.9 3.3(L)  Chloride 98 - 111 mmol/L 98 99 -  CO2 22 - 32 mmol/L 33(H) 26 -  Calcium 8.9 - 10.3 mg/dL 9.1 9.0 -   CBC Latest Ref Rng & Units 11/04/2018 10/28/2018 10/28/2018  WBC 4.0 - 10.5 K/uL 13.6(H) - 7.1  Hemoglobin 12.0 - 15.0 g/dL 10.8(L) 11.9(L) 11.9(L)  Hematocrit 36.0 - 46.0 % 35.1(L) 35.0(L) 38.5  Platelets 150 - 400 K/uL 308 - 343    Assessment and Plan: #1 Gold stage D COPD by pulmonary function studies, recent exacerbation triggered by upper respiratory tract infection and change in weather with associated chronic hypoxic respiratory failure and hypercarbic respiratory failure, previous tracheostomy with chronic ventilator support now off the ventilator and the tracheostomy site is healed from a prior admission in 2014   The patient has improved but is still quite fragile and this was a telephone visit.  I do need to establish the patient's current pulmonary status with an in office visit.  Also need to do a complete medication reconciliation as the patient and daughter were bit confused as to which nebulized medications  she has at this time.  Plan will be for the patient to have refills on Anoro 1 inhalation daily she will change DuoNeb to albuterol by nebulization 3 times daily and she will have a refill on the albuterol handheld inhaler to take as needed.  She will also refill budesonide twice daily but discontinue the Brovana as this is not covered by Medicaid.  No additional steroids or antibiotics are indicated at this time and the patient will maintain oxygen therapy at 3 L continuous  #2 hypertension blood pressure ranges appear to be in good order with recent home health visits.    Plan will be to continue lisinopril and amlodipine and refills were sent to the pharmacy.  We will need to check a BM P at the next office visit  #3 hyperglycemia this appeared to be steroid induced and improved at discharge we will follow-up  with checking a metabolic panel at the next office visit  #4 vitamin D deficiency will need to check a vitamin D level to next office visit and will refill the weekly 50,000 unit vitamin D prescription    Follow Up Instructions: The patient and daughter understand that refills on all meds were sent to her local pharmacy and a return office visit will be maintained   I discussed the assessment and treatment plan with the patient. The patient was provided an opportunity to ask questions and all were answered. The patient agreed with the plan and demonstrated an understanding of the instructions.   The patient was advised to call back or seek an in-person evaluation if the symptoms worsen or if the condition fails to improve as anticipated.  I provided 45 minutes of non-face-to-face time during this encounter  including  median intraservice time , review of notes, labs, imaging , meds  and explaingin diagnosis and management to the patient .    Asencion Noble, MD

## 2018-11-28 ENCOUNTER — Telehealth: Payer: Self-pay

## 2018-11-28 NOTE — Telephone Encounter (Signed)
Anoro Ellipta is not covered by medicaid, they prefer any of the following: spiriva, stiolto, combivent or bevespi. Please consider changing to a preferrd drug or provide clinical reasoning for use of non-preferred drug so a PA can be attempted.

## 2018-11-28 NOTE — Telephone Encounter (Signed)
Patient called stating that pharmacy is needing a prior auth for umeclidinium-vilanterol [facility] ELLIPTA) 62.5-25 MCG/INH AEPB [[id number]] Please follow up

## 2018-12-01 ENCOUNTER — Telehealth: Payer: Self-pay

## 2018-12-01 MED ORDER — TIOTROPIUM BROMIDE MONOHYDRATE 18 MCG IN CAPS: 18.0000 ug | ORAL_CAPSULE | Freq: Every day | RESPIRATORY_TRACT | 2 refills | Status: DC

## 2018-12-01 NOTE — Telephone Encounter (Signed)
Will change to spiriva handihaler

## 2018-12-01 NOTE — Telephone Encounter (Signed)
Order for pulse oximeter has been faxed to [facility] for home BP monitor faxed to [facility]

## 2018-12-03 ENCOUNTER — Encounter: Payer: Self-pay | Admitting: Critical Care Medicine

## 2018-12-03 ENCOUNTER — Ambulatory Visit: Payer: Medicaid Other | Admitting: Critical Care Medicine

## 2018-12-03 ENCOUNTER — Other Ambulatory Visit: Payer: Self-pay

## 2018-12-03 ENCOUNTER — Ambulatory Visit: Payer: Medicaid Other | Attending: Critical Care Medicine | Admitting: Critical Care Medicine

## 2018-12-03 VITALS — BP 124/93 | HR 106 | Temp 99.1°F | Resp 20 | Ht 65.0 in | Wt 204.8 lb

## 2018-12-03 DIAGNOSIS — J432 Centrilobular emphysema: Secondary | ICD-10-CM | POA: Insufficient documentation

## 2018-12-03 DIAGNOSIS — R195 Other fecal abnormalities: Secondary | ICD-10-CM | POA: Insufficient documentation

## 2018-12-03 DIAGNOSIS — Z79899 Other long term (current) drug therapy: Secondary | ICD-10-CM | POA: Diagnosis not present

## 2018-12-03 DIAGNOSIS — I10 Essential (primary) hypertension: Secondary | ICD-10-CM | POA: Insufficient documentation

## 2018-12-03 DIAGNOSIS — J449 Chronic obstructive pulmonary disease, unspecified: Secondary | ICD-10-CM | POA: Diagnosis present

## 2018-12-03 DIAGNOSIS — J9611 Chronic respiratory failure with hypoxia: Secondary | ICD-10-CM

## 2018-12-03 DIAGNOSIS — E559 Vitamin D deficiency, unspecified: Secondary | ICD-10-CM

## 2018-12-03 DIAGNOSIS — Z809 Family history of malignant neoplasm, unspecified: Secondary | ICD-10-CM | POA: Diagnosis not present

## 2018-12-03 DIAGNOSIS — D649 Anemia, unspecified: Secondary | ICD-10-CM | POA: Insufficient documentation

## 2018-12-03 DIAGNOSIS — Z825 Family history of asthma and other chronic lower respiratory diseases: Secondary | ICD-10-CM | POA: Diagnosis not present

## 2018-12-03 DIAGNOSIS — Z1239 Encounter for other screening for malignant neoplasm of breast: Secondary | ICD-10-CM | POA: Diagnosis not present

## 2018-12-03 DIAGNOSIS — Z87891 Personal history of nicotine dependence: Secondary | ICD-10-CM | POA: Diagnosis not present

## 2018-12-03 DIAGNOSIS — J418 Mixed simple and mucopurulent chronic bronchitis: Secondary | ICD-10-CM | POA: Diagnosis not present

## 2018-12-03 DIAGNOSIS — Z1211 Encounter for screening for malignant neoplasm of colon: Secondary | ICD-10-CM | POA: Insufficient documentation

## 2018-12-03 DIAGNOSIS — J9621 Acute and chronic respiratory failure with hypoxia: Secondary | ICD-10-CM | POA: Diagnosis not present

## 2018-12-03 DIAGNOSIS — J9612 Chronic respiratory failure with hypercapnia: Secondary | ICD-10-CM

## 2018-12-03 DIAGNOSIS — Z8249 Family history of ischemic heart disease and other diseases of the circulatory system: Secondary | ICD-10-CM | POA: Insufficient documentation

## 2018-12-03 DIAGNOSIS — R739 Hyperglycemia, unspecified: Secondary | ICD-10-CM

## 2018-12-03 DIAGNOSIS — J9622 Acute and chronic respiratory failure with hypercapnia: Secondary | ICD-10-CM | POA: Diagnosis not present

## 2018-12-03 DIAGNOSIS — Z859 Personal history of malignant neoplasm, unspecified: Secondary | ICD-10-CM | POA: Diagnosis not present

## 2018-12-03 MED ORDER — IBUPROFEN 600 MG PO TABS: 600.0000 mg | ORAL_TABLET | Freq: Four times a day (QID) | ORAL | 1 refills | Status: DC | PRN

## 2018-12-03 MED ORDER — LISINOPRIL 20 MG PO TABS: 40.0000 mg | ORAL_TABLET | Freq: Every day | ORAL | 3 refills | Status: DC

## 2018-12-03 NOTE — Patient Instructions (Signed)
Stay on Spiriva daily Use budesonide twice daily in the nebulizer after using albuterol nebulizer  You may use albuterol nebulizer or the handheld albuterol 2 additional times during the day  Refills on your blood pressure medicine and all your other medications are at the [facility], I did change the lisinopril to 2 of the 20 mg tablets daily  Stay on oxygen as prescribed and we will see if we can get you a light weight portable oxygen system  A tetanus vaccine was given today and have ordered a stool card for you to check for blood in the stool and as well a mammogram has been ordered  Would like a WebEx visit with you in 2 weeks

## 2018-12-03 NOTE — Assessment & Plan Note (Signed)
The patient remains dependent upon oxygen therapy

## 2018-12-03 NOTE — Assessment & Plan Note (Signed)
Ongoing vitamin D deficiency documented  Continue vitamin D weekly

## 2018-12-03 NOTE — Assessment & Plan Note (Signed)
Hyperglycemia is resolved with steroid induced

## 2018-12-03 NOTE — Assessment & Plan Note (Signed)
Hypertension well-controlled at this time We will continue lisinopril 40 mg daily and amlodipine 5 mg daily

## 2018-12-03 NOTE — Progress Notes (Signed)
Subjective:    Patient ID: Bianca Murray, female    DOB: 08-27-1955, 63 y.o.   MRN: [id number]  This is a 63 year old female with advanced chronic obstructive lung disease.  Pulmonary functions have been obtained earlier in 2019 by her local pulmonologist in [facility].  This revealed an FEV1 of 25% predicted diffusion capacity of 20% predicted.  This places the patient in the Gold stage D COPD category with significant advanced emphysema. The patient did remove recently from her home in [place] to be closer to her daughter and is now staying in her daughter's home in this move occurred in January 2020.  Since that time the patient began having viral symptoms of an upper respiratory tract infection and was admitted to the hospital on March 17 being discharged on the 25th.  Below are excerpts from the discharge summary  Admit date: 10/28/2018 Discharge date: 11/05/2018  Discharge Diagnosis:  Principal Problem:   Acute on chronic respiratory failure with hypoxia and hypercapnia (HCC) Active Problems:   COPD with acute exacerbation (HCC)   COPD (chronic obstructive pulmonary disease) (HCC)   Chronic respiratory failure with hypoxia and hypercapnia (HCC)   Hyperglycemia  History of Present Illness:  Bianca Murray a 63 y.o.femalewith medical history significant ofO2 dependent COPD, Asthma. She has required intubation and tracheostomy in the past (though this has been reversed). She moved down to the area a few weeks ago from [place]. No known sick contacts. For the past 4-5 days she has had increased SOB, cough, wheezing. This has been gradually worsening since onset. She is on 3-4L O2 at baseline.   Hospital Course by Problem:  Acute on chronic hypoxic and hypercapnic respiratory failure Secondary to COPD.Slight improvement today.Sats 96%on 3L.Explained to patient needto keep sat between 89-92%.Patientcontinues to feel anxietyand reports worsening sob  when oxygen weaned. Improved air movement with less wheeze today -will weansolumedrol from 60mg  every 6hours to evey 8 hours -continue towean oxygen -may need  COPD exacerbation Chestx-ray with no acute abnormality. Hx of ESLD has been evaluated to lung transplant.Patient was unable to tolerate BiPAP.notes from pulmonology reveal most recent visit 01/2017. PFT at that time with showed severe obstructive ventilatory defect, air trapping and mildly reduced diffusion capacity - continuenebs with Brovana and Pulmicort  -will need OP pulm referral  Essential hypertension -resume home meds  Normocytic anemia Hemoglobin stable, outpatient follow-up recommended.  Hyperglycemia: likely related to steroids -carb mod diet  Note the patient is on oxygen 3 L continuous and has a portable system from [facility].  Her saturations have been in the mid 90% range.  The patient states her shortness of breath has slightly worsened over the last 2 weeks because she ran out of her Anoro inhaler and also was not able to obtain the [place].  The patient denies any chest pain.  There is no nocturnal shortness of breath.  No lower extremity edema.  There is a minimal productive cough of white mucus.  There is no hemoptysis.  She has no COVID exposure.   This patient is seen again today in the office for follow-up from last week's telephone visit.  The patient maintains oxygen 3 L continuous.  The patient does desire a oxygen concentrator that is portable.  The patient states her level of shortness of breath is at baseline.  She is awaiting pickup of her Spiriva inhaler.  Patient still has visiting nurse coming.  Patient does have the albuterol by nebulizer as needed and budesonide twice  daily.  She is now off oral antibiotics and steroids.  Blood pressure appears to be in good control with lisinopril and amlodipine.  We did confirm the patient is on 40 mg a day of lisinopril not 20.  Labs are  pending at this time and she will need repeat lab studies   The patient was on Anoro but is not covered under her insurance plan will attempt to get her Spiriva from the pharmacy      Past Medical History:  Diagnosis Date   Arthritis    Asthma    Cancer ([place])    Chronic respiratory failure with hypoxia and hypercapnia (HCC)    COPD (chronic obstructive pulmonary disease) ([place])    Hypertension      Family History  Problem Relation Age of Onset   Hypertension Mother    Heart disease Mother    Cancer Mother    Asthma Father    Asthma Sister    Hypertension Sister    Asthma Brother    Cancer Brother      Social History   Socioeconomic History   Marital status: Single    Spouse name: Not on file   Number of children: Not on file   Years of education: Not on file   Highest education level: Not on file  Occupational History   Not on file  Social Needs   Financial resource strain: Not on file   Food insecurity:    Worry: Not on file    Inability: Not on file   Transportation needs:    Medical: Not on file    Non-medical: Not on file  Tobacco Use   Smoking status: Former Smoker   Smokeless tobacco: Never Used   Tobacco comment: quit smoking in 2018    Substance and Sexual Activity   Alcohol use: Not Currently   Drug use: Not Currently   Sexual activity: Not on file  Lifestyle   Physical activity:    Days per week: Not on file    Minutes per session: Not on file   Stress: Not on file  Relationships   Social connections:    Talks on phone: Not on file    Gets together: Not on file    Attends religious service: Not on file    Active member of club or organization: Not on file    Attends meetings of clubs or organizations: Not on file    Relationship status: Not on file   Intimate partner violence:    Fear of current or ex partner: Not on file    Emotionally abused: Not on file    Physically abused: Not on file    Forced  sexual activity: Not on file  Other Topics Concern   Not on file  Social History Narrative   Not on file     No Known Allergies   Outpatient Medications Prior to Visit  Medication Sig Dispense Refill   albuterol (PROVENTIL) (2.5 MG/3ML) 0.083% nebulizer solution Take 3 mLs (2.5 mg total) by nebulization 3 (three) times daily for 30 days. 270 mL 1   albuterol (VENTOLIN HFA) 108 (90 Base) MCG/ACT inhaler Inhale 1-2 puffs into the lungs every 6 (six) hours as needed for wheezing or shortness of breath. 1 Inhaler 3   amLODipine (NORVASC) 5 MG tablet Take 1 tablet (5 mg total) by mouth daily. 30 tablet 3   Blood Pressure Monitoring (BLOOD PRESSURE MONITOR AUTOMAT) DEVI Measure blood pressure and pulse daily 1 Device 0  budesonide (PULMICORT) 0.5 MG/2ML nebulizer solution Take 2 mLs (0.5 mg total) by nebulization 2 (two) times daily. 120 mL 1   loratadine (CLARITIN) 10 MG tablet Take 1 tablet (10 mg total) by mouth daily. 30 tablet 3   Misc. Devices (PULSE OXIMETER FOR FINGER) MISC 1 application by Does not apply route daily as needed. 1 each 0   tiotropium (SPIRIVA HANDIHALER) 18 MCG inhalation capsule Place 1 capsule (18 mcg total) into inhaler and inhale daily. 30 capsule 2   Vitamin D, Ergocalciferol, (DRISDOL) 1.25 MG (50000 UT) CAPS capsule Take 1 capsule (50,000 Units total) by mouth every 7 (seven) days. 4 capsule 2   lisinopril (ZESTRIL) 20 MG tablet Take 1 tablet (20 mg total) by mouth daily. 30 tablet 3   No facility-administered medications prior to visit.      Review of Systems  Constitutional: Positive for activity change and fatigue. Negative for fever.  HENT: Negative.   Eyes: Negative.   Respiratory: Positive for cough, chest tightness, shortness of breath and wheezing.   Cardiovascular: Negative for chest pain, palpitations and leg swelling.  Gastrointestinal: Negative.   Endocrine: Negative.   Genitourinary: Negative.   Musculoskeletal: Negative.     Allergic/Immunologic: Negative.   Neurological: Negative.   Hematological: Negative.   Psychiatric/Behavioral: Negative.        Objective:   Physical Exam Vitals:   12/03/18 1101  BP: (!) 124/93  Pulse: (!) 106  Resp: 20  Temp: 99.1 F (37.3 C)  SpO2: 99%  Weight: 204 lb 12.8 oz (92.9 kg)  Height: 5\' 5"  (1.651 m)  PF: (!) 3 L/min    Gen: Pleasant, well-nourished, in no distress,  normal affect  ENT: No lesions,  mouth clear,  oropharynx clear, no postnasal drip  Neck: No JVD, no TMG, no carotid bruits  Lungs: No use of accessory muscles, no dullness to percussion, distant breath sounds  Cardiovascular: RRR, heart sounds normal, no murmur or gallops, no peripheral edema  Abdomen: soft and NT, no HSM,  BS normal  Musculoskeletal: No deformities, no cyanosis or clubbing  Neuro: alert, non focal  Skin: Warm, no lesions or rashes   BMP Latest Ref Rng & Units 11/04/2018 11/03/2018 10/28/2018  Glucose 70 - 99 mg/dL 184(H) 165(H) -  BUN 8 - 23 mg/dL 19 19 -  Creatinine 0.44 - 1.00 mg/dL 0.80 0.81 -  Sodium 135 - 145 mmol/L 138 135 139  Potassium 3.5 - 5.1 mmol/L 4.2 3.9 3.3(L)  Chloride 98 - 111 mmol/L 98 99 -  CO2 22 - 32 mmol/L 33(H) 26 -  Calcium 8.9 - 10.3 mg/dL 9.1 9.0 -   CBC Latest Ref Rng & Units 11/04/2018 10/28/2018 10/28/2018  WBC 4.0 - 10.5 K/uL 13.6(H) - 7.1  Hemoglobin 12.0 - 15.0 g/dL 10.8(L) 11.9(L) 11.9(L)  Hematocrit 36.0 - 46.0 % 35.1(L) 35.0(L) 38.5  Platelets 150 - 400 K/uL 308 - 343  CXR: 3/18 IMPRESSION: No acute abnormality seen.     Assessment & Plan:  I personally reviewed all images and lab data in the [facility] system as well as any outside material available during this office visit and agree with the  radiology impressions.   COPD (chronic obstructive pulmonary disease) ([place]) Pulmonary functions from the patient's pulmonologist in [place] show an FEV1 of 25% predicted and diffusion capacity of 20% predicted total lung capacity 91% with  residual volume of 154% predicted and 2018  The patient has Gold stage D COPD and will need further medication adjustment  Plan will be to continue oxygen 3 L continuously and see if we can get a portable oxygen concentrator  Will also begin Spiriva daily and continue the desonide twice daily and albuterol by nebulizer twice daily and 2 additional treatments as needed  No additional prednisone is indicated  Hyperglycemia Hyperglycemia is resolved with steroid induced  Chronic respiratory failure with hypoxia and hypercapnia (HCC) The patient remains dependent upon oxygen therapy  Vitamin D deficiency Ongoing vitamin D deficiency documented  Continue vitamin D weekly  Hypertension Hypertension well-controlled at this time We will continue lisinopril 40 mg daily and amlodipine 5 mg daily   Katlyn was seen today for copd.  Diagnoses and all orders for this visit:  Colon cancer screening -     Fecal occult blood, imunochemical  Breast cancer screening -     MM DIGITAL SCREENING BILATERAL; Future  Mixed simple and mucopurulent chronic bronchitis (HCC)  Vitamin D deficiency  Hyperglycemia  Chronic respiratory failure with hypoxia and hypercapnia (HCC)  Essential hypertension  Other orders -     lisinopril (ZESTRIL) 20 MG tablet; Take 2 tablets (40 mg total) by mouth daily. -     ibuprofen (ADVIL) 600 MG tablet; Take 1 tablet (600 mg total) by mouth every 6 (six) hours as needed.  Note we will also refill Advil 600 mg every 6 hours as needed and obtain a fecal blood sample along with scheduling a mammogram for breast cancer screening  A blood pressure automated cuff will be administered and given to the patient for home monitoring purposes along with a pulse oximeter  We will establish a WebEx video visit in 2 weeks

## 2018-12-03 NOTE — Addendum Note (Signed)
Addended by: Elsie Stain on: 12/03/2018 04:27 PM   Modules accepted: Orders

## 2018-12-03 NOTE — Assessment & Plan Note (Addendum)
Pulmonary functions from the patient's pulmonologist in [place] show an FEV1 of 25% predicted and diffusion capacity of 20% predicted total lung capacity 91% with residual volume of 154% predicted and 2018  The patient has Gold stage D COPD and will need further medication adjustment  Plan will be to continue oxygen 3 L continuously and see if we can get a portable oxygen concentrator  Will also begin Spiriva daily and continue the desonide twice daily and albuterol by nebulizer twice daily and 2 additional treatments as needed  No additional prednisone is indicated

## 2018-12-08 ENCOUNTER — Telehealth: Payer: Self-pay

## 2018-12-08 NOTE — Telephone Encounter (Signed)
Call placed to [facility] , spoke to [place] who stated that they have portable O2 concentrators available.  Attempted to contact patient, spoke to daughter, Bianca Murray. The patient does not have a phone and [name] was not home and works from [place].  She said that her mother currently has the home concentrator and self fill system from [facility]; but would like a portable concentrator.  Explained to her that an order will be sent to [facility] as they reported that currently they have portable O2 concentrators. Informed her that she will be notified if a walk test is needed.

## 2018-12-15 ENCOUNTER — Telehealth: Payer: Self-pay

## 2018-12-15 NOTE — Telephone Encounter (Signed)
CMN received from [facility] for home BP monitor. It was noted on document that a wrist BP monitor was ordered . Call placed to the agency, spoke to [place] and explained to her that a BP cuff monitor was ordered. She stated that this one was sent in error, she will have new CMN faxed to the clinic. She will also confirm with the retail store that the patient received the correct monitor.    CMN for POC faxed to [facility] - fax # [phone]

## 2018-12-17 ENCOUNTER — Telehealth: Payer: Self-pay

## 2018-12-17 NOTE — Telephone Encounter (Signed)
Call placed to [facility] to check on status of oximeter and POC.  Spoke to [employer] who stated that they have not received the order for the oximeter and she requested that it be re-faxed.   Regarding the POC, she stated that they received the order and the POCs  are on back order and there is no idea when they will be back in.  This CM requested that they call and explain that to patient and Solmon Ice stated that they would call her.   Order for oximeter faxed as requested.

## 2018-12-24 ENCOUNTER — Telehealth: Payer: Self-pay

## 2018-12-24 NOTE — Telephone Encounter (Signed)
Call placed to [facility]. Spoke to [place] who confirmed that the BP monitor has been shipped to patient

## 2018-12-25 ENCOUNTER — Telehealth: Payer: Self-pay

## 2018-12-25 NOTE — Telephone Encounter (Signed)
Call placed to [facility] to check on status of pulse oximeter and POC.  Spoke to [place] who stated that they called patient's daughter and explained to her that the oximeter is not covered by the patient's insurance and they will need to pay for it. She was directed to go to the retail store.  Regarding the POC, they have no idea when they will be back in stock.  They attempted to contact the patient /daughter about this issue and were not able to leave a message/ voicemail was not set up.

## 2018-12-30 ENCOUNTER — Telehealth: Payer: Self-pay

## 2018-12-30 NOTE — Telephone Encounter (Signed)
Signed CMN faxed to [facility]

## 2019-01-08 ENCOUNTER — Telehealth: Payer: Self-pay

## 2019-01-08 MED ORDER — PREDNISONE 10 MG PO TABS: ORAL_TABLET | ORAL | 0 refills | Status: DC

## 2019-01-08 NOTE — Telephone Encounter (Signed)
Patients call returned.  Patient identified by name and date of birth.  Patient states that she has been short of breath more than usual.  Patient states that inhalers prescribed are helping.  Patient has had to increase oxygen from 3L to 3.5-4L with little relief.  Patient endorses dry, non productive cough.  Patient denies headache or fevers.  Patient denies congestion or runny nose, as well as a sore throat. Patient states she has no abdominal or musculoskeletal issues.  Patient advised that PCP would be notified and information would be relayed back to her.   Patient was told that if his condition worsened and/or change for the worse to go to the Emergency Department or Urgent care.  Patient acknowledged understanding of advice.

## 2019-01-08 NOTE — Telephone Encounter (Signed)
Patient called states she is still having trouble breathing and that what she was prescribed did not work for her at all. Please follow up.   Advised to go to the ED or UC

## 2019-01-08 NOTE — Telephone Encounter (Signed)
Patients call returned.  Patient's daughter  identified patient by name and date of birth.  Patient's daughter advised of new prescription and new appointment made to see Dr. Joya Gaskins.  Patient's daughter acknowledged understanding of advice.

## 2019-01-08 NOTE — Telephone Encounter (Signed)
Only option is to repulse prednisone.    Rx sent to the pharmcy, her [facility]

## 2019-01-19 ENCOUNTER — Telehealth: Payer: Self-pay | Admitting: *Deleted

## 2019-01-19 DIAGNOSIS — J418 Mixed simple and mucopurulent chronic bronchitis: Secondary | ICD-10-CM

## 2019-01-19 DIAGNOSIS — J9612 Chronic respiratory failure with hypercapnia: Secondary | ICD-10-CM

## 2019-01-19 DIAGNOSIS — J9611 Chronic respiratory failure with hypoxia: Secondary | ICD-10-CM

## 2019-01-19 NOTE — Telephone Encounter (Signed)
I am happy to sign new DME order.    Opal Sidles,  What are next steps?

## 2019-01-19 NOTE — Telephone Encounter (Signed)
Patient daughter called stating she is trying to get patient oxygen company switched to [employer]. [employer], new Rx and copy of her test. Patient used to use [facility]. Please f/u    Fax: [phone]

## 2019-01-20 ENCOUNTER — Telehealth: Payer: Self-pay

## 2019-01-20 NOTE — Telephone Encounter (Signed)
James,RT with [facility] suggested that patient may benefit from a  Trilogy if Dr Joya Gaskins is in agreement.    Informed him that Dr Joya Gaskins has given approval to proceed with the order,   Jeneen Rinks explained, that after they obtain the order they will request prior authorization from [facility].  After Josem Kaufmann is received he will contact the patient to explain what the trilogy is and the medical benefits as well as  financial coverage.  He can meet with the patient in person if she requests.  He noted that medicaid should cover 100%.

## 2019-01-20 NOTE — Telephone Encounter (Signed)
New order for home oxygen to switch to [employer] placed

## 2019-01-20 NOTE — Telephone Encounter (Signed)
Call received from Berger, RT with [employer]. He stated that they patient is known to their company and received O2 from them when she was in [place]. He said that they are working on getting the patient her home concentrator, and back up tank as well as a replacement battery for her POC. No additional documentation is needed.

## 2019-01-20 NOTE — Telephone Encounter (Signed)
Order for home O2 faxed to [employer]

## 2019-01-22 ENCOUNTER — Encounter: Payer: Self-pay | Admitting: Critical Care Medicine

## 2019-01-22 ENCOUNTER — Other Ambulatory Visit: Payer: Self-pay

## 2019-01-22 ENCOUNTER — Ambulatory Visit: Payer: Medicaid Other | Attending: Critical Care Medicine | Admitting: Critical Care Medicine

## 2019-01-22 DIAGNOSIS — J9612 Chronic respiratory failure with hypercapnia: Secondary | ICD-10-CM | POA: Insufficient documentation

## 2019-01-22 DIAGNOSIS — M1990 Unspecified osteoarthritis, unspecified site: Secondary | ICD-10-CM | POA: Insufficient documentation

## 2019-01-22 DIAGNOSIS — D649 Anemia, unspecified: Secondary | ICD-10-CM | POA: Diagnosis not present

## 2019-01-22 DIAGNOSIS — J449 Chronic obstructive pulmonary disease, unspecified: Secondary | ICD-10-CM | POA: Diagnosis not present

## 2019-01-22 DIAGNOSIS — Z87891 Personal history of nicotine dependence: Secondary | ICD-10-CM | POA: Diagnosis not present

## 2019-01-22 DIAGNOSIS — Z7951 Long term (current) use of inhaled steroids: Secondary | ICD-10-CM | POA: Insufficient documentation

## 2019-01-22 DIAGNOSIS — I10 Essential (primary) hypertension: Secondary | ICD-10-CM | POA: Diagnosis not present

## 2019-01-22 DIAGNOSIS — Z8249 Family history of ischemic heart disease and other diseases of the circulatory system: Secondary | ICD-10-CM | POA: Insufficient documentation

## 2019-01-22 DIAGNOSIS — Z9981 Dependence on supplemental oxygen: Secondary | ICD-10-CM | POA: Diagnosis not present

## 2019-01-22 DIAGNOSIS — J9611 Chronic respiratory failure with hypoxia: Secondary | ICD-10-CM

## 2019-01-22 DIAGNOSIS — J418 Mixed simple and mucopurulent chronic bronchitis: Secondary | ICD-10-CM | POA: Diagnosis not present

## 2019-01-22 DIAGNOSIS — Z79899 Other long term (current) drug therapy: Secondary | ICD-10-CM | POA: Insufficient documentation

## 2019-01-22 DIAGNOSIS — Z825 Family history of asthma and other chronic lower respiratory diseases: Secondary | ICD-10-CM | POA: Insufficient documentation

## 2019-01-22 DIAGNOSIS — Z23 Encounter for immunization: Secondary | ICD-10-CM | POA: Diagnosis not present

## 2019-01-22 MED ORDER — UMECLIDINIUM-VILANTEROL 62.5-25 MCG/INH IN AEPB: 1.0000 | INHALATION_SPRAY | Freq: Every day | RESPIRATORY_TRACT | 3 refills | Status: DC

## 2019-01-22 MED FILL — ANORO ELLIPTA 62.5-25 MCG I: 62.5-25 | 30 days supply | Qty: 60 | Fill #0 | Status: TO

## 2019-01-22 NOTE — Progress Notes (Addendum)
Subjective:    Patient ID: Bianca Murray, female    DOB: 1955-11-22, 63 y.o.   MRN: [id number]  This is a 63 year old female with advanced chronic obstructive lung disease.  Pulmonary functions have been obtained earlier in 2019 by her local pulmonologist in [facility].  This revealed an FEV1 of 25% predicted diffusion capacity of 20% predicted.  This places the patient in the Gold stage D COPD category with significant advanced emphysema. The patient did remove recently from her home in [place] to be closer to her daughter and is now staying in her daughter's home in this move occurred in January 2020.  Since that time the patient began having viral symptoms of an upper respiratory tract infection and was admitted to the hospital on March 17 being discharged on the 25th.  Below are excerpts from the discharge summary  Admit date: 10/28/2018 Discharge date: 11/05/2018  Discharge Diagnosis:  Principal Problem:   Acute on chronic respiratory failure with hypoxia and hypercapnia (HCC) Active Problems:   COPD with acute exacerbation (HCC)   COPD (chronic obstructive pulmonary disease) (HCC)   Chronic respiratory failure with hypoxia and hypercapnia (HCC)   Hyperglycemia  History of Present Illness:  Bianca Murray a 63 y.o.femalewith medical history significant ofO2 dependent COPD, Asthma. She has required intubation and tracheostomy in the past (though this has been reversed). She moved down to the area a few weeks ago from [place]. No known sick contacts. For the past 4-5 days she has had increased SOB, cough, wheezing. This has been gradually worsening since onset. She is on 3-4L O2 at baseline.   Hospital Course by Problem:  Acute on chronic hypoxic and hypercapnic respiratory failure Secondary to COPD.Slight improvement today.Sats 96%on 3L.Explained to patient needto keep sat between 89-92%.Patientcontinues to feel anxietyand reports worsening sob  when oxygen weaned. Improved air movement with less wheeze today -will weansolumedrol from 60mg  every 6hours to evey 8 hours -continue towean oxygen -may need  COPD exacerbation Chestx-ray with no acute abnormality. Hx of ESLD has been evaluated to lung transplant.Patient was unable to tolerate BiPAP.notes from pulmonology reveal most recent visit 01/2017. PFT at that time with showed severe obstructive ventilatory defect, air trapping and mildly reduced diffusion capacity - continuenebs with Brovana and Pulmicort  -will need OP pulm referral  Essential hypertension -resume home meds  Normocytic anemia Hemoglobin stable, outpatient follow-up recommended.  Hyperglycemia: likely related to steroids -carb mod diet  The patient is seen in follow-up today and note has had her oxygen system switch back to [place].  She now has an [employer] portable oxygen concentrator.  She runs 3 L continuous.  Her saturations remained in the mid 90s on this.  She states she is still dyspneic with minimal exertion.  She was on Anoro but it was not covered by her insurance and we switched her to Spiriva she is not doing as well.  She states mucus gets stuck in her throat.  There is no chest pain.  She is sleeping well.     Past Medical History:  Diagnosis Date  . Arthritis   . Asthma   . Cancer ([place])   . Chronic respiratory failure with hypoxia and hypercapnia (HCC)   . COPD (chronic obstructive pulmonary disease) ([place])   . Hypertension      Family History  Problem Relation Age of Onset  . Hypertension Mother   . Heart disease Mother   . Cancer Mother   . Asthma Father   . Asthma  Sister   . Hypertension Sister   . Asthma Brother   . Cancer Brother      Social History   Socioeconomic History  . Marital status: Single    Spouse name: Not on file  . Number of children: Not on file  . Years of education: Not on file  . Highest education level: Not on file  Occupational History   . Not on file  Social Needs  . Financial resource strain: Not on file  . Food insecurity    Worry: Not on file    Inability: Not on file  . Transportation needs    Medical: Not on file    Non-medical: Not on file  Tobacco Use  . Smoking status: Former Research scientist (life sciences)  . Smokeless tobacco: Never Used  . Tobacco comment: quit smoking in 2018    Substance and Sexual Activity  . Alcohol use: Not Currently  . Drug use: Not Currently  . Sexual activity: Not on file  Lifestyle  . Physical activity    Days per week: Not on file    Minutes per session: Not on file  . Stress: Not on file  Relationships  . Social Herbalist on phone: Not on file    Gets together: Not on file    Attends religious service: Not on file    Active member of club or organization: Not on file    Attends meetings of clubs or organizations: Not on file    Relationship status: Not on file  . Intimate partner violence    Fear of current or ex partner: Not on file    Emotionally abused: Not on file    Physically abused: Not on file    Forced sexual activity: Not on file  Other Topics Concern  . Not on file  Social History Narrative  . Not on file     No Known Allergies   Outpatient Medications Prior to Visit  Medication Sig Dispense Refill  . albuterol (VENTOLIN HFA) 108 (90 Base) MCG/ACT inhaler Inhale 1-2 puffs into the lungs every 6 (six) hours as needed for wheezing or shortness of breath. 1 Inhaler 3  . amLODipine (NORVASC) 5 MG tablet Take 1 tablet (5 mg total) by mouth daily. 30 tablet 3  . Blood Pressure Monitoring (BLOOD PRESSURE MONITOR AUTOMAT) DEVI Measure blood pressure and pulse daily 1 Device 0  . budesonide (PULMICORT) 0.5 MG/2ML nebulizer solution Take 2 mLs (0.5 mg total) by nebulization 2 (two) times daily. 120 mL 1  . ibuprofen (ADVIL) 600 MG tablet Take 1 tablet (600 mg total) by mouth every 6 (six) hours as needed. 60 tablet 1  . lisinopril (ZESTRIL) 20 MG tablet Take 2 tablets (40  mg total) by mouth daily. 60 tablet 3  . loratadine (CLARITIN) 10 MG tablet Take 1 tablet (10 mg total) by mouth daily. 30 tablet 3  . Misc. Devices (PULSE OXIMETER FOR FINGER) MISC 1 application by Does not apply route daily as needed. 1 each 0  . Vitamin D, Ergocalciferol, (DRISDOL) 1.25 MG (50000 UT) CAPS capsule Take 1 capsule (50,000 Units total) by mouth every 7 (seven) days. 4 capsule 2  . tiotropium (SPIRIVA HANDIHALER) 18 MCG inhalation capsule Place 1 capsule (18 mcg total) into inhaler and inhale daily. 30 capsule 2  . albuterol (PROVENTIL) (2.5 MG/3ML) 0.083% nebulizer solution Take 3 mLs (2.5 mg total) by nebulization 3 (three) times daily for 30 days. 270 mL 1  . predniSONE (DELTASONE)  10 MG tablet Take 4 tablets daily for 5 days then stop (Patient not taking: Reported on 01/22/2019) 20 tablet 0   No facility-administered medications prior to visit.      Review of Systems  Constitutional: Positive for activity change and fatigue. Negative for fever.  HENT: Negative.   Eyes: Negative.   Respiratory: Positive for cough, chest tightness, shortness of breath and wheezing.   Cardiovascular: Negative for chest pain, palpitations and leg swelling.  Gastrointestinal: Negative.   Endocrine: Negative.   Genitourinary: Negative.   Musculoskeletal: Negative.   Allergic/Immunologic: Negative.   Neurological: Negative.   Hematological: Negative.   Psychiatric/Behavioral: Negative.        Objective:   Physical Exam Vitals:   01/22/19 1349  BP: (!) 144/84  Pulse: (!) 118  Temp: 98.2 F (36.8 C)  TempSrc: Oral  SpO2: 95%  Weight: 204 lb (92.5 kg)  Height: 5\' 5"  (1.651 m)    Gen: Pleasant, well-nourished, in no distress,  normal affect  ENT: No lesions,  mouth clear,  oropharynx clear, no postnasal drip  Neck: No JVD, no TMG, no carotid bruits  Lungs: No use of accessory muscles, no dullness to percussion, distant breath sounds  Cardiovascular: RRR, heart sounds normal,  no murmur or gallops, no peripheral edema  Abdomen: soft and NT, no HSM,  BS normal  Musculoskeletal: No deformities, no cyanosis or clubbing  Neuro: alert, non focal  Skin: Warm, no lesions or rashes   BMP Latest Ref Rng & Units 11/04/2018 11/03/2018 10/28/2018  Glucose 70 - 99 mg/dL 184(H) 165(H) -  BUN 8 - 23 mg/dL 19 19 -  Creatinine 0.44 - 1.00 mg/dL 0.80 0.81 -  Sodium 135 - 145 mmol/L 138 135 139  Potassium 3.5 - 5.1 mmol/L 4.2 3.9 3.3(L)  Chloride 98 - 111 mmol/L 98 99 -  CO2 22 - 32 mmol/L 33(H) 26 -  Calcium 8.9 - 10.3 mg/dL 9.1 9.0 -   CBC Latest Ref Rng & Units 11/04/2018 10/28/2018 10/28/2018  WBC 4.0 - 10.5 K/uL 13.6(H) - 7.1  Hemoglobin 12.0 - 15.0 g/dL 10.8(L) 11.9(L) 11.9(L)  Hematocrit 36.0 - 46.0 % 35.1(L) 35.0(L) 38.5  Platelets 150 - 400 K/uL 308 - 343  CXR: 3/18 IMPRESSION: No acute abnormality seen. ABG  FIO2 1.0:    Component Value Date/Time   PHART 7.320 (L) 10/28/2018 0255   PCO2ART 62.8 (H) 10/28/2018 0255   PO2ART 196.0 (H) 10/28/2018 0255   HCO3 32.3 (H) 10/28/2018 0255   TCO2 34 (H) 10/28/2018 0255   O2SAT 100.0 10/28/2018 0255  Pulmonary functions have been obtained earlier in 2019 by her local pulmonologist in [facility].  This revealed an FEV1 of 25% predicted and diffusion capacity of 20% predicted.  This places the patient in the Gold stage D COPD category with significant advanced emphysema.     Assessment & Plan:  I personally reviewed all images and lab data in the [facility] system as well as any outside material available during this office visit and agree with the  radiology impressions.   COPD GOLD D (chronic obstructive pulmonary disease) (HCC) Gold stage D COPD with FEV1 of 25% predicted  Plan will be to switch back to Anoro at 1 puff daily and discontinue Spiriva The patient will continue budesonide twice daily and albuterol by nebulization 3 times daily  The patient will continue oxygen supplementation  Chronic respiratory  failure with hypoxia and hypercapnia ([place]) Patient will continue her oxygen therapy   Addy was  seen today for copd.  Diagnoses and all orders for this visit:  Mixed simple and mucopurulent chronic bronchitis (HCC)  Chronic respiratory failure with hypoxia and hypercapnia ([place])  Other orders -     umeclidinium-vilanterol (ANORO ELLIPTA) 62.5-25 MCG/INH AEPB; Inhale 1 puff into the lungs daily.   A tetanus vaccine was administered at this visit

## 2019-01-22 NOTE — Assessment & Plan Note (Signed)
Gold stage D COPD with FEV1 of 25% predicted  Plan will be to switch back to Anoro at 1 puff daily and discontinue Spiriva The patient will continue budesonide twice daily and albuterol by nebulization 3 times daily  The patient will continue oxygen supplementation

## 2019-01-22 NOTE — Patient Instructions (Signed)
I will try to get you back on Anoro at 1 puff daily to replace the Spiriva  Use your flutter valve twice daily after your nebulizer use  A tetanus vaccine was given  No other changes in your medications  Return in follow-up 1 month

## 2019-01-22 NOTE — Assessment & Plan Note (Addendum)
Patient will continue her oxygen therapy  This patient has chronic respiratory failure with both hypoxemia and hypercarbia and is due to chronic obstructive lung disease and is life-threatening.  Arterial blood gases in March showed a high carbon dioxide and spirometry shows very severe obstructive defect she is a gold stage D.  She would benefit from noninvasive ventilation.  Without this therapy the patient is at high risk for ending up with worsening symptoms including worsening respiratory failure and emergency room and hospitalization recurrences.  Bilevel device unable to adequately support her nocturnal vent needs and she did not tolerate a BiPAP device in the hospital.  She would benefit from noninvasive therapy with a set tidal volumes and pressure support

## 2019-01-27 ENCOUNTER — Telehealth: Payer: Self-pay

## 2019-01-27 NOTE — Telephone Encounter (Signed)
Call placed to Bianca Murray and informed him that provider's note is completed in Epic.   Signed CMNs and orders for trilogy faxed to [place].

## 2019-02-12 ENCOUNTER — Ambulatory Visit: Payer: Medicaid Other

## 2019-02-12 ENCOUNTER — Other Ambulatory Visit: Payer: Self-pay | Admitting: Critical Care Medicine

## 2019-02-24 ENCOUNTER — Other Ambulatory Visit: Payer: Self-pay | Admitting: Critical Care Medicine

## 2019-03-02 ENCOUNTER — Other Ambulatory Visit: Payer: Self-pay | Admitting: Critical Care Medicine

## 2019-03-03 ENCOUNTER — Telehealth: Payer: Self-pay

## 2019-03-03 NOTE — Telephone Encounter (Signed)
Call placed to [employer],spoke to Ronnell Gu[place]aj[place] who confirmed that the Trilogy has been set up for the patient.

## 2019-03-05 ENCOUNTER — Ambulatory Visit: Payer: Medicaid Other | Admitting: Critical Care Medicine

## 2019-03-11 ENCOUNTER — Other Ambulatory Visit: Payer: Self-pay | Admitting: Critical Care Medicine

## 2019-03-28 ENCOUNTER — Other Ambulatory Visit: Payer: Self-pay | Admitting: Critical Care Medicine

## 2019-04-20 ENCOUNTER — Other Ambulatory Visit: Payer: Self-pay | Admitting: Critical Care Medicine

## 2019-04-28 ENCOUNTER — Other Ambulatory Visit: Payer: Self-pay | Admitting: Critical Care Medicine

## 2019-05-14 ENCOUNTER — Other Ambulatory Visit: Payer: Self-pay | Admitting: Critical Care Medicine

## 2019-06-27 ENCOUNTER — Other Ambulatory Visit: Payer: Self-pay | Admitting: Critical Care Medicine

## 2019-07-01 ENCOUNTER — Telehealth: Payer: Self-pay | Admitting: Critical Care Medicine

## 2019-07-01 MED ORDER — IBUPROFEN 600 MG PO TABS: ORAL_TABLET | ORAL | 0 refills | Status: DC

## 2019-07-01 MED ORDER — AMLODIPINE BESYLATE 5 MG PO TABS: ORAL_TABLET | ORAL | 0 refills | Status: DC

## 2019-07-01 MED ORDER — ALBUTEROL SULFATE HFA 108 (90 BASE) MCG/ACT IN AERS: INHALATION_SPRAY | RESPIRATORY_TRACT | 2 refills | Status: DC

## 2019-07-01 NOTE — Telephone Encounter (Signed)
1) Medication(s) Requested ibuprofen (ADVIL) 600 MG tablet [id number]   amLODipine (NORVASC) 5 MG tablet [id number]   albuterol (VENTOLIN HFA) 108 (90 Base) MCG/ACT inhaler [id number]      2) Pharmacy of Choice: [facility], [address] AT [facility] [address]  3) Special Requests:   Approved medications will be sent to the pharmacy, we will reach out if there is an issue.  Requests made after 3pm may not be addressed until the following business day!  If a patient is unsure of the name of the medication(s) please note and ask patient to call back when they are able to provide all info, do not send to responsible party until all information is available!

## 2019-07-01 NOTE — Addendum Note (Signed)
Addended by: Elsie Stain on: 07/01/2019 07:52 PM   Modules accepted: Orders

## 2019-07-01 NOTE — Telephone Encounter (Signed)
The pt does need an appointment

## 2019-07-01 NOTE — Telephone Encounter (Signed)
Dr. Joya Gaskins,   Patient is requesting fills for these prescriptions but has not been seen since June of this year with no follow-ups on file. Do you want to provide refills for him?

## 2019-07-01 NOTE — Telephone Encounter (Signed)
Refills sent to [facility] pharmacy

## 2019-07-02 MED FILL — VENTOLIN HFA 90 MCG INHALER: 108 (90 BAS | 25 days supply | Qty: 18 | Fill #0

## 2019-07-06 NOTE — Telephone Encounter (Signed)
07/15/2019 9:50 appointment.

## 2019-07-08 ENCOUNTER — Telehealth: Payer: Self-pay

## 2019-07-08 NOTE — Telephone Encounter (Signed)
CMN for non invasive ventilator faxed to [facility].

## 2019-07-15 ENCOUNTER — Ambulatory Visit: Payer: Medicaid Other | Admitting: Critical Care Medicine

## 2019-08-19 ENCOUNTER — Other Ambulatory Visit: Payer: Self-pay

## 2019-08-19 ENCOUNTER — Ambulatory Visit: Payer: Medicaid Other | Attending: Critical Care Medicine | Admitting: Critical Care Medicine

## 2019-08-19 ENCOUNTER — Encounter: Payer: Self-pay | Admitting: Critical Care Medicine

## 2019-08-19 DIAGNOSIS — J9611 Chronic respiratory failure with hypoxia: Secondary | ICD-10-CM

## 2019-08-19 DIAGNOSIS — J432 Centrilobular emphysema: Secondary | ICD-10-CM

## 2019-08-19 DIAGNOSIS — J9612 Chronic respiratory failure with hypercapnia: Secondary | ICD-10-CM

## 2019-08-19 DIAGNOSIS — J418 Mixed simple and mucopurulent chronic bronchitis: Secondary | ICD-10-CM

## 2019-08-19 MED ORDER — ALBUTEROL SULFATE (2.5 MG/3ML) 0.083% IN NEBU: 2.5000 mg | INHALATION_SOLUTION | Freq: Three times a day (TID) | RESPIRATORY_TRACT | 1 refills | Status: DC

## 2019-08-19 MED ORDER — IBUPROFEN 600 MG PO TABS: ORAL_TABLET | ORAL | 0 refills | Status: DC

## 2019-08-19 MED ORDER — PULSE OXIMETER FOR FINGER MISC: 1.0000 "application " | Freq: Every day | 0 refills | Status: DC | PRN

## 2019-08-19 MED ORDER — UMECLIDINIUM-VILANTEROL 62.5-25 MCG/INH IN AEPB: 1.0000 | INHALATION_SPRAY | Freq: Every day | RESPIRATORY_TRACT | 3 refills | Status: DC

## 2019-08-19 MED ORDER — AMLODIPINE BESYLATE 5 MG PO TABS: ORAL_TABLET | ORAL | 0 refills | Status: DC

## 2019-08-19 MED ORDER — CEFDINIR 300 MG PO CAPS: 300.0000 mg | ORAL_CAPSULE | Freq: Two times a day (BID) | ORAL | 0 refills | Status: DC

## 2019-08-19 MED ORDER — VITAMIN D (ERGOCALCIFEROL) 1.25 MG (50000 UNIT) PO CAPS: ORAL_CAPSULE | ORAL | 2 refills | Status: DC

## 2019-08-19 MED ORDER — LORATADINE 10 MG PO TABS: 10.0000 mg | ORAL_TABLET | Freq: Every day | ORAL | 3 refills | Status: DC

## 2019-08-19 MED ORDER — LISINOPRIL 40 MG PO TABS: 40.0000 mg | ORAL_TABLET | Freq: Every day | ORAL | 1 refills | Status: DC

## 2019-08-19 MED ORDER — ALBUTEROL SULFATE HFA 108 (90 BASE) MCG/ACT IN AERS: INHALATION_SPRAY | RESPIRATORY_TRACT | 2 refills | Status: DC

## 2019-08-19 NOTE — Progress Notes (Signed)
Subjective:    Patient ID: Bianca Murray, female    DOB: 25-Jun-1956, 64 y.o.   MRN: [id number] Virtual Visit via Telephone Note  I connected with [name] on 08/19/19 at 10:30 AM EST by telephone and verified that I am speaking with the correct person using two identifiers.   Consent:  I discussed the limitations, risks, security and privacy concerns of performing an evaluation and management service by telephone and the availability of in person appointments. I also discussed with the patient that there may be a patient responsible charge related to this service. The patient expressed understanding and agreed to proceed.  Location of patient: pt at home   Location of provider: I am in my office  Persons participating in the televisit with the patient.   Pts daughter Page Spiro on the call  History of Present Illness: This is a 64 year old female with advanced chronic obstructive lung disease.  Pulmonary functions have been obtained earlier in 2019 by her local pulmonologist in [facility].  This revealed an FEV1 of 25% predicted diffusion capacity of 20% predicted.  This places the patient in the Gold stage D COPD category with significant advanced emphysema. The patient did remove recently from her home in [place] to be closer to her daughter and is now staying in her daughter's home in this move occurred in January 2020.  Since that time the patient began having viral symptoms of an upper respiratory tract infection and was admitted to the hospital on March 17 being discharged on the 25th.  Below are excerpts from the discharge summary  Admit date: 10/28/2018 Discharge date: 11/05/2018  Discharge Diagnosis:  Principal Problem:   Acute on chronic respiratory failure with hypoxia and hypercapnia (HCC) Active Problems:   COPD with acute exacerbation (HCC)   COPD (chronic obstructive pulmonary disease) (HCC)   Chronic respiratory failure with hypoxia and hypercapnia (HCC)  Hyperglycemia  History of Present Illness:  Lanel Hurtt a 64 y.o.femalewith medical history significant ofO2 dependent COPD, Asthma. She has required intubation and tracheostomy in the past (though this has been reversed). She moved down to the area a few weeks ago from [place]. No known sick contacts. For the past 4-5 days she has had increased SOB, cough, wheezing. This has been gradually worsening since onset. She is on 3-4L O2 at baseline.   Hospital Course by Problem:  Acute on chronic hypoxic and hypercapnic respiratory failure Secondary to COPD.Slight improvement today.Sats 96%on 3L.Explained to patient needto keep sat between 89-92%.Patientcontinues to feel anxietyand reports worsening sob when oxygen weaned. Improved air movement with less wheeze today -will weansolumedrol from 60mg  every 6hours to evey 8 hours -continue towean oxygen -may need  COPD exacerbation Chestx-ray with no acute abnormality. Hx of ESLD has been evaluated to lung transplant.Patient was unable to tolerate BiPAP.notes from pulmonology reveal most recent visit 01/2017. PFT at that time with showed severe obstructive ventilatory defect, air trapping and mildly reduced diffusion capacity - continuenebs with Brovana and Pulmicort  -will need OP pulm referral  Essential hypertension -resume home meds  Normocytic anemia Hemoglobin stable, outpatient follow-up recommended.  Hyperglycemia: likely related to steroids -carb mod diet  The patient is seen in follow-up today and note has had her oxygen system switch back to [place].  She now has an inogen portable oxygen concentrator.  She runs 3 L continuous.  Her saturations remained in the mid 90s on this.  She states she is still dyspneic with minimal exertion.  She was on Anoro  but it was not covered by her insurance and we switched her to Spiriva she is not doing as well.  She states mucus gets stuck in her  throat.  There is no chest pain.  She is sleeping well.  08/19/2019 Last seen 01/2019 Pt with more dyspnea, cough, no chest pain.  Notes more phlegm is present.  Needs a pulse ox. BP ok    On 4L oxygen.   Cannot tolerate prednisone or inhaled steroid.  Gold D Copd stage D   Shortness of Breath This is a chronic problem. The current episode started more than 1 year ago. The problem occurs daily. The problem has been gradually worsening. Associated symptoms include chest pain, sputum production and wheezing. Pertinent negatives include no fever or leg swelling. The symptoms are aggravated by lying flat and exercise.    Past Medical History:  Diagnosis Date  . Arthritis   . Asthma   . Cancer ([place])   . Chronic respiratory failure with hypoxia and hypercapnia (HCC)   . COPD (chronic obstructive pulmonary disease) ([place])   . Hypertension      Family History  Problem Relation Age of Onset  . Hypertension Mother   . Heart disease Mother   . Cancer Mother   . Asthma Father   . Asthma Sister   . Hypertension Sister   . Asthma Brother   . Cancer Brother      Social History   Socioeconomic History  . Marital status: Single    Spouse name: Not on file  . Number of children: Not on file  . Years of education: Not on file  . Highest education level: Not on file  Occupational History  . Not on file  Tobacco Use  . Smoking status: Former Research scientist (life sciences)  . Smokeless tobacco: Never Used  . Tobacco comment: quit smoking in 2018    Substance and Sexual Activity  . Alcohol use: Not Currently  . Drug use: Not Currently  . Sexual activity: Not on file  Other Topics Concern  . Not on file  Social History Narrative  . Not on file   Social Determinants of Health   Financial Resource Strain:   . Difficulty of Paying Living Expenses: Not on file  Food Insecurity:   . Worried About Charity fundraiser in the Last Year: Not on file  . Ran Out of Food in the Last Year: Not on file   Transportation Needs:   . Lack of Transportation (Medical): Not on file  . Lack of Transportation (Non-Medical): Not on file  Physical Activity:   . Days of Exercise per Week: Not on file  . Minutes of Exercise per Session: Not on file  Stress:   . Feeling of Stress : Not on file  Social Connections:   . Frequency of Communication with Friends and Family: Not on file  . Frequency of Social Gatherings with Friends and Family: Not on file  . Attends Religious Services: Not on file  . Active Member of Clubs or Organizations: Not on file  . Attends Archivist Meetings: Not on file  . Marital Status: Not on file  Intimate Partner Violence:   . Fear of Current or Ex-Partner: Not on file  . Emotionally Abused: Not on file  . Physically Abused: Not on file  . Sexually Abused: Not on file     No Known Allergies   Outpatient Medications Prior to Visit  Medication Sig Dispense Refill  . Blood Pressure  Monitoring (BLOOD PRESSURE MONITOR AUTOMAT) DEVI Measure blood pressure and pulse daily 1 Device 0  . albuterol (VENTOLIN HFA) 108 (90 Base) MCG/ACT inhaler INHALE 1 TO 2 PUFFS INTO THE LUNGS EVERY 6 HOURS AS NEEDED FOR WHEEZING OR SHORTNESS OF BREATH 6.7 g 2  . amLODipine (NORVASC) 5 MG tablet TAKE 1 TABLET(5 MG) BY MOUTH DAILY 90 tablet 0  . ibuprofen (ADVIL) 600 MG tablet TAKE 1 TABLET(600 MG) BY MOUTH EVERY 6 HOURS AS NEEDED 60 tablet 0  . lisinopril (ZESTRIL) 20 MG tablet TAKE 2 TABLETS(40 MG) BY MOUTH DAILY 60 tablet 3  . loratadine (CLARITIN) 10 MG tablet Take 1 tablet (10 mg total) by mouth daily. 30 tablet 3  . Misc. Devices (PULSE OXIMETER FOR FINGER) MISC 1 application by Does not apply route daily as needed. 1 each 0  . umeclidinium-vilanterol (ANORO ELLIPTA) 62.5-25 MCG/INH AEPB Inhale 1 puff into the lungs daily. 60 each 3  . Vitamin D, Ergocalciferol, (DRISDOL) 1.25 MG (50000 UT) CAPS capsule TAKE 1 CAPSULE BY MOUTH EVERY 7 DAYS 4 capsule 2  . albuterol (PROVENTIL)  (2.5 MG/3ML) 0.083% nebulizer solution Take 3 mLs (2.5 mg total) by nebulization 3 (three) times daily for 30 days. 270 mL 1  . budesonide (PULMICORT) 0.5 MG/2ML nebulizer solution Take 2 mLs (0.5 mg total) by nebulization 2 (two) times daily. (Patient not taking: Reported on 08/19/2019) 120 mL 1  . predniSONE (DELTASONE) 10 MG tablet Take 4 tablets daily for 5 days then stop (Patient not taking: Reported on 08/19/2019) 20 tablet 0   No facility-administered medications prior to visit.     Review of Systems  Constitutional: Positive for activity change and fatigue. Negative for fever.  HENT: Negative.   Eyes: Negative.   Respiratory: Positive for cough, sputum production, chest tightness, shortness of breath and wheezing.   Cardiovascular: Positive for chest pain. Negative for palpitations and leg swelling.  Gastrointestinal: Negative.   Endocrine: Negative.   Genitourinary: Negative.   Musculoskeletal: Negative.   Allergic/Immunologic: Negative.   Neurological: Negative.   Hematological: Negative.   Psychiatric/Behavioral: Negative.        Objective:   Physical Exam There were no vitals filed for this visit. No exam , telephone visit   BMP Latest Ref Rng & Units 11/04/2018 11/03/2018 10/28/2018  Glucose 70 - 99 mg/dL 184(H) 165(H) -  BUN 8 - 23 mg/dL 19 19 -  Creatinine 0.44 - 1.00 mg/dL 0.80 0.81 -  Sodium 135 - 145 mmol/L 138 135 139  Potassium 3.5 - 5.1 mmol/L 4.2 3.9 3.3(L)  Chloride 98 - 111 mmol/L 98 99 -  CO2 22 - 32 mmol/L 33(H) 26 -  Calcium 8.9 - 10.3 mg/dL 9.1 9.0 -   CBC Latest Ref Rng & Units 11/04/2018 10/28/2018 10/28/2018  WBC 4.0 - 10.5 K/uL 13.6(H) - 7.1  Hemoglobin 12.0 - 15.0 g/dL 10.8(L) 11.9(L) 11.9(L)  Hematocrit 36.0 - 46.0 % 35.1(L) 35.0(L) 38.5  Platelets 150 - 400 K/uL 308 - 343  CXR: 3/18 IMPRESSION: No acute abnormality seen. ABG  FIO2 1.0:    Component Value Date/Time   PHART 7.320 (L) 10/28/2018 0255   PCO2ART 62.8 (H) 10/28/2018 0255    PO2ART 196.0 (H) 10/28/2018 0255   HCO3 32.3 (H) 10/28/2018 0255   TCO2 34 (H) 10/28/2018 0255   O2SAT 100.0 10/28/2018 0255  Pulmonary functions have been obtained earlier in 2019 by her local pulmonologist in [facility].  This revealed an FEV1 of 25% predicted and diffusion capacity  of 20% predicted.  This places the patient in the Gold stage D COPD category with significant advanced emphysema.     Assessment & Plan:  I personally reviewed all images and lab data in the [facility] system as well as any outside material available during this office visit and agree with the  radiology impressions.   Chronic respiratory failure with hypoxia and hypercapnia (HCC) Obtain pulse oximeter Cont oxygen 4L   COPD GOLD D (chronic obstructive pulmonary disease) (HCC) Cont Anoro as Rx  Use albuterol bid on schedule  D/c budesonide, not tolerating  Rx cefdinir   Diagnoses and all orders for this visit:  Chronic respiratory failure with hypoxia and hypercapnia (HCC)  Mixed simple and mucopurulent chronic bronchitis (HCC)  Other orders -     albuterol (PROVENTIL) (2.5 MG/3ML) 0.083% nebulizer solution; Take 3 mLs (2.5 mg total) by nebulization 3 (three) times daily. -     albuterol (VENTOLIN HFA) 108 (90 Base) MCG/ACT inhaler; INHALE 1 TO 2 PUFFS INTO THE LUNGS EVERY 6 HOURS AS NEEDED FOR WHEEZING OR SHORTNESS OF BREATH -     amLODipine (NORVASC) 5 MG tablet; TAKE 1 TABLET(5 MG) BY MOUTH DAILY -     ibuprofen (ADVIL) 600 MG tablet; TAKE 1 TABLET(600 MG) BY MOUTH EVERY 6 HOURS AS NEEDED -     lisinopril (ZESTRIL) 40 MG tablet; Take 1 tablet (40 mg total) by mouth daily. -     loratadine (CLARITIN) 10 MG tablet; Take 1 tablet (10 mg total) by mouth daily. -     umeclidinium-vilanterol (ANORO ELLIPTA) 62.5-25 MCG/INH AEPB; Inhale 1 puff into the lungs daily. -     Vitamin D, Ergocalciferol, (DRISDOL) 1.25 MG (50000 UT) CAPS capsule; TAKE 1 CAPSULE BY MOUTH EVERY 7 DAYS -     Misc. Devices  (PULSE OXIMETER FOR FINGER) MISC; 1 application by Does not apply route daily as needed. -     cefdinir (OMNICEF) 300 MG capsule; Take 1 capsule (300 mg total) by mouth 2 (two) times daily.    Follow Up Instructions: Knows a video visit will be established in one week   I discussed the assessment and treatment plan with the patient. The patient was provided an opportunity to ask questions and all were answered. The patient agreed with the plan and demonstrated an understanding of the instructions.   The patient was advised to call back or seek an in-person evaluation if the symptoms worsen or if the condition fails to improve as anticipated.  I provided 30 minutes of non-face-to-face time during this encounter  including  median intraservice time , review of notes, labs, imaging, medications  and explaining diagnosis and management to the patient .    Asencion Noble, MD

## 2019-08-19 NOTE — Assessment & Plan Note (Addendum)
Cont Anoro as Rx  Use albuterol bid on schedule  D/c budesonide, not tolerating  Rx cefdinir

## 2019-08-19 NOTE — Assessment & Plan Note (Signed)
Obtain pulse oximeter Cont oxygen 4L

## 2019-08-26 ENCOUNTER — Encounter: Payer: Self-pay | Admitting: Critical Care Medicine

## 2019-08-26 ENCOUNTER — Telehealth: Payer: Medicaid Other | Admitting: Critical Care Medicine

## 2019-08-26 NOTE — Progress Notes (Deleted)
Subjective:    Patient ID: Bianca Murray, female    DOB: 1956-04-17, 64 y.o.   MRN: [id number] Virtual Visit via Telephone Note  I connected with Bianca Murray on 08/26/19 at  8:30 AM EST by telephone and verified that I am speaking with the correct person using two identifiers.   Consent:  I discussed the limitations, risks, security and privacy concerns of performing an evaluation and management service by telephone and the availability of in person appointments. I also discussed with the patient that there may be a patient responsible charge related to this service. The patient expressed understanding and agreed to proceed.  Location of patient: pt at home   Location of provider: I am in my office  Persons participating in the televisit with the patient.   Pts daughter Bianca Murray on the call  History of Present Illness: This is a 64 year old female with advanced chronic obstructive lung disease.  Pulmonary functions have been obtained earlier in 2019 by her local pulmonologist in [facility].  This revealed an FEV1 of 25% predicted diffusion capacity of 20% predicted.  This places the patient in the Gold stage D COPD category with significant advanced emphysema. The patient did remove recently from her home in [place] to be closer to her daughter and is now staying in her daughter's home in this move occurred in January 2020.  Since that time the patient began having viral symptoms of an upper respiratory tract infection and was admitted to the hospital on March 17 being discharged on the 25th.  Below are excerpts from the discharge summary  Admit date: 10/28/2018 Discharge date: 11/05/2018  Discharge Diagnosis:  Principal Problem:   Acute on chronic respiratory failure with hypoxia and hypercapnia (HCC) Active Problems:   COPD with acute exacerbation (HCC)   COPD (chronic obstructive pulmonary disease) (HCC)   Chronic respiratory failure with hypoxia and hypercapnia (HCC)  Hyperglycemia  History of Present Illness:  Bianca Murray a 64 y.o.femalewith medical history significant ofO2 dependent COPD, Asthma. She has required intubation and tracheostomy in the past (though this has been reversed). She moved down to the area a few weeks ago from [place]. No known sick contacts. For the past 4-5 days she has had increased SOB, cough, wheezing. This has been gradually worsening since onset. She is on 3-4L O2 at baseline.   Hospital Course by Problem:  Acute on chronic hypoxic and hypercapnic respiratory failure Secondary to COPD.Slight improvement today.Sats 96%on 3L.Explained to patient needto keep sat between 89-92%.Patientcontinues to feel anxietyand reports worsening sob when oxygen weaned. Improved air movement with less wheeze today -will weansolumedrol from 60mg  every 6hours to evey 8 hours -continue towean oxygen -may need  COPD exacerbation Chestx-ray with no acute abnormality. Hx of ESLD has been evaluated to lung transplant.Patient was unable to tolerate BiPAP.notes from pulmonology reveal most recent visit 01/2017. PFT at that time with showed severe obstructive ventilatory defect, air trapping and mildly reduced diffusion capacity - continuenebs with Brovana and Pulmicort  -will need OP pulm referral  Essential hypertension -resume home meds  Normocytic anemia Hemoglobin stable, outpatient follow-up recommended.  Hyperglycemia: likely related to steroids -carb mod diet  The patient is seen in follow-up today and note has had her oxygen system switch back to [place].  She now has an inogen portable oxygen concentrator.  She runs 3 L continuous.  Her saturations remained in the mid 90s on this.  She states she is still dyspneic with minimal exertion.  She was on  Anoro but it was not covered by her insurance and we switched her to Spiriva she is not doing as well.  She states mucus gets stuck in her  throat.  There is no chest pain.  She is sleeping well.  08/19/2019 Last seen 01/2019 Pt with more dyspnea, cough, no chest pain.  Notes more phlegm is present.  Needs a pulse ox. BP ok    On 4L oxygen.   Cannot tolerate prednisone or inhaled steroid.  Gold D Copd stage D  08/26/2019 Video visit f/u:    Chronic respiratory failure with hypoxia and hypercapnia (HCC) Obtain pulse oximeter Cont oxygen 4L   COPD GOLD D (chronic obstructive pulmonary disease) (HCC) Cont Anoro as Rx  Use albuterol bid on schedule  D/c budesonide, not tolerating  Rx cefdinir      Shortness of Breath This is a chronic problem. The current episode started more than 1 year ago. The problem occurs daily. The problem has been gradually worsening. Associated symptoms include chest pain, sputum production and wheezing. Pertinent negatives include no fever or leg swelling. The symptoms are aggravated by lying flat and exercise.    Past Medical History:  Diagnosis Date  . Arthritis   . Asthma   . Cancer ([place])   . Chronic respiratory failure with hypoxia and hypercapnia (HCC)   . COPD (chronic obstructive pulmonary disease) ([place])   . Hypertension      Family History  Problem Relation Age of Onset  . Hypertension Mother   . Heart disease Mother   . Cancer Mother   . Asthma Father   . Asthma Sister   . Hypertension Sister   . Asthma Brother   . Cancer Brother      Social History   Socioeconomic History  . Marital status: Single    Spouse name: Not on file  . Number of children: Not on file  . Years of education: Not on file  . Highest education level: Not on file  Occupational History  . Not on file  Tobacco Use  . Smoking status: Former Research scientist (life sciences)  . Smokeless tobacco: Never Used  . Tobacco comment: quit smoking in 2018    Substance and Sexual Activity  . Alcohol use: Not Currently  . Drug use: Not Currently  . Sexual activity: Not on file  Other Topics Concern  . Not on  file  Social History Narrative  . Not on file   Social Determinants of Health   Financial Resource Strain:   . Difficulty of Paying Living Expenses: Not on file  Food Insecurity:   . Worried About Charity fundraiser in the Last Year: Not on file  . Ran Out of Food in the Last Year: Not on file  Transportation Needs:   . Lack of Transportation (Medical): Not on file  . Lack of Transportation (Non-Medical): Not on file  Physical Activity:   . Days of Exercise per Week: Not on file  . Minutes of Exercise per Session: Not on file  Stress:   . Feeling of Stress : Not on file  Social Connections:   . Frequency of Communication with Friends and Family: Not on file  . Frequency of Social Gatherings with Friends and Family: Not on file  . Attends Religious Services: Not on file  . Active Member of Clubs or Organizations: Not on file  . Attends Archivist Meetings: Not on file  . Marital Status: Not on file  Intimate Partner Violence:   .  Fear of Current or Ex-Partner: Not on file  . Emotionally Abused: Not on file  . Physically Abused: Not on file  . Sexually Abused: Not on file     No Known Allergies   Outpatient Medications Prior to Visit  Medication Sig Dispense Refill  . albuterol (PROVENTIL) (2.5 MG/3ML) 0.083% nebulizer solution Take 3 mLs (2.5 mg total) by nebulization 3 (three) times daily. 270 mL 1  . albuterol (VENTOLIN HFA) 108 (90 Base) MCG/ACT inhaler INHALE 1 TO 2 PUFFS INTO THE LUNGS EVERY 6 HOURS AS NEEDED FOR WHEEZING OR SHORTNESS OF BREATH 6.7 g 2  . amLODipine (NORVASC) 5 MG tablet TAKE 1 TABLET(5 MG) BY MOUTH DAILY 90 tablet 0  . Blood Pressure Monitoring (BLOOD PRESSURE MONITOR AUTOMAT) DEVI Measure blood pressure and pulse daily 1 Device 0  . cefdinir (OMNICEF) 300 MG capsule Take 1 capsule (300 mg total) by mouth 2 (two) times daily. 14 capsule 0  . ibuprofen (ADVIL) 600 MG tablet TAKE 1 TABLET(600 MG) BY MOUTH EVERY 6 HOURS AS NEEDED 60 tablet 0  .  lisinopril (ZESTRIL) 40 MG tablet Take 1 tablet (40 mg total) by mouth daily. 90 tablet 1  . loratadine (CLARITIN) 10 MG tablet Take 1 tablet (10 mg total) by mouth daily. 30 tablet 3  . Misc. Devices (PULSE OXIMETER FOR FINGER) MISC 1 application by Does not apply route daily as needed. 1 each 0  . umeclidinium-vilanterol (ANORO ELLIPTA) 62.5-25 MCG/INH AEPB Inhale 1 puff into the lungs daily. 60 each 3  . Vitamin D, Ergocalciferol, (DRISDOL) 1.25 MG (50000 UT) CAPS capsule TAKE 1 CAPSULE BY MOUTH EVERY 7 DAYS 4 capsule 2   No facility-administered medications prior to visit.     Review of Systems  Constitutional: Positive for activity change and fatigue. Negative for fever.  HENT: Negative.   Eyes: Negative.   Respiratory: Positive for cough, sputum production, chest tightness, shortness of breath and wheezing.   Cardiovascular: Positive for chest pain. Negative for palpitations and leg swelling.  Gastrointestinal: Negative.   Endocrine: Negative.   Genitourinary: Negative.   Musculoskeletal: Negative.   Allergic/Immunologic: Negative.   Neurological: Negative.   Hematological: Negative.   Psychiatric/Behavioral: Negative.        Objective:   Physical Exam There were no vitals filed for this visit. No exam , telephone visit   BMP Latest Ref Rng & Units 11/04/2018 11/03/2018 10/28/2018  Glucose 70 - 99 mg/dL 184(H) 165(H) -  BUN 8 - 23 mg/dL 19 19 -  Creatinine 0.44 - 1.00 mg/dL 0.80 0.81 -  Sodium 135 - 145 mmol/L 138 135 139  Potassium 3.5 - 5.1 mmol/L 4.2 3.9 3.3(L)  Chloride 98 - 111 mmol/L 98 99 -  CO2 22 - 32 mmol/L 33(H) 26 -  Calcium 8.9 - 10.3 mg/dL 9.1 9.0 -   CBC Latest Ref Rng & Units 11/04/2018 10/28/2018 10/28/2018  WBC 4.0 - 10.5 K/uL 13.6(H) - 7.1  Hemoglobin 12.0 - 15.0 g/dL 10.8(L) 11.9(L) 11.9(L)  Hematocrit 36.0 - 46.0 % 35.1(L) 35.0(L) 38.5  Platelets 150 - 400 K/uL 308 - 343  CXR: 3/18 IMPRESSION: No acute abnormality seen. ABG  FIO2 1.0:     Component Value Date/Time   PHART 7.320 (L) 10/28/2018 0255   PCO2ART 62.8 (H) 10/28/2018 0255   PO2ART 196.0 (H) 10/28/2018 0255   HCO3 32.3 (H) 10/28/2018 0255   TCO2 34 (H) 10/28/2018 0255   O2SAT 100.0 10/28/2018 0255  Pulmonary functions have been obtained earlier in  2019 by her local pulmonologist in [place].  This revealed an FEV1 of 25% predicted and diffusion capacity of 20% predicted.  This places the patient in the Gold stage D COPD category with significant advanced emphysema.     Assessment & Plan:  I personally reviewed all images and lab data in the [facility] system as well as any outside material available during this office visit and agree with the  radiology impressions.   No problem-specific Assessment & Plan notes found for this encounter.   There are no diagnoses linked to this encounter.  Follow Up Instructions: Knows a video visit will be established in one week   I discussed the assessment and treatment plan with the patient. The patient was provided an opportunity to ask questions and all were answered. The patient agreed with the plan and demonstrated an understanding of the instructions.   The patient was advised to call back or seek an in-person evaluation if the symptoms worsen or if the condition fails to improve as anticipated.  I provided 30 minutes of non-face-to-face time during this encounter  including  median intraservice time , review of notes, labs, imaging, medications  and explaining diagnosis and management to the patient .    Asencion Noble, MD

## 2019-09-15 ENCOUNTER — Ambulatory Visit: Payer: Medicaid Other | Attending: Critical Care Medicine | Admitting: Critical Care Medicine

## 2019-09-15 ENCOUNTER — Encounter: Payer: Self-pay | Admitting: Critical Care Medicine

## 2019-09-15 ENCOUNTER — Other Ambulatory Visit: Payer: Self-pay

## 2019-09-15 DIAGNOSIS — J9612 Chronic respiratory failure with hypercapnia: Secondary | ICD-10-CM

## 2019-09-15 DIAGNOSIS — J432 Centrilobular emphysema: Secondary | ICD-10-CM | POA: Diagnosis not present

## 2019-09-15 DIAGNOSIS — J418 Mixed simple and mucopurulent chronic bronchitis: Secondary | ICD-10-CM | POA: Diagnosis not present

## 2019-09-15 DIAGNOSIS — J9611 Chronic respiratory failure with hypoxia: Secondary | ICD-10-CM

## 2019-09-15 MED ORDER — PROMETHAZINE-DM 6.25-15 MG/5ML PO SYRP: 5.0000 mL | ORAL_SOLUTION | Freq: Four times a day (QID) | ORAL | 0 refills | Status: DC | PRN

## 2019-09-15 MED ORDER — CEFDINIR 300 MG PO CAPS: 300.0000 mg | ORAL_CAPSULE | Freq: Two times a day (BID) | ORAL | 0 refills | Status: AC

## 2019-09-15 NOTE — Progress Notes (Signed)
Subjective:    Patient ID: Bianca Murray, female    DOB: 05-21-1956, 64 y.o.   MRN: [id number] Virtual Visit via Telephone Note  I connected with [name] on 09/15/19 at  2:00 PM EST by telephone and verified that I am speaking with the correct person using two identifiers.   Consent:  I discussed the limitations, risks, security and privacy concerns of performing an evaluation and management service by telephone and the availability of in person appointments. I also discussed with the patient that there may be a patient responsible charge related to this service. The patient expressed understanding and agreed to proceed.  Location of patient: pt at home   Location of provider: I am in my office  Persons participating in the televisit with the patient.   Pts daughter Page Spiro on the call  History of Present Illness: This is a 64 year old female with advanced chronic obstructive lung disease.  Pulmonary functions have been obtained earlier in 2019 by her local pulmonologist in [facility].  This revealed an FEV1 of 25% predicted diffusion capacity of 20% predicted.  This places the patient in the Gold stage D COPD category with significant advanced emphysema. The patient did remove recently from her home in [place] to be closer to her daughter and is now staying in her daughter's home in this move occurred in January 2020.  Since that time the patient began having viral symptoms of an upper respiratory tract infection and was admitted to the hospital on March 17 being discharged on the 25th.  Below are excerpts from the discharge summary  Admit date: 10/28/2018 Discharge date: 11/05/2018  Discharge Diagnosis:  Principal Problem:   Acute on chronic respiratory failure with hypoxia and hypercapnia (HCC) Active Problems:   COPD with acute exacerbation (HCC)   COPD (chronic obstructive pulmonary disease) (HCC)   Chronic respiratory failure with hypoxia and hypercapnia (HCC)  Hyperglycemia  History of Present Illness:  Hristina Striano a 64 y.o.femalewith medical history significant ofO2 dependent COPD, Asthma. She has required intubation and tracheostomy in the past (though this has been reversed). She moved down to the area a few weeks ago from [place]. No known sick contacts. For the past 4-5 days she has had increased SOB, cough, wheezing. This has been gradually worsening since onset. She is on 3-4L O2 at baseline.   Hospital Course by Problem:  Acute on chronic hypoxic and hypercapnic respiratory failure Secondary to COPD.Slight improvement today.Sats 96%on 3L.Explained to patient needto keep sat between 89-92%.Patientcontinues to feel anxietyand reports worsening sob when oxygen weaned. Improved air movement with less wheeze today -will weansolumedrol from 60mg  every 6hours to evey 8 hours -continue towean oxygen -may need  COPD exacerbation Chestx-ray with no acute abnormality. Hx of ESLD has been evaluated to lung transplant.Patient was unable to tolerate BiPAP.notes from pulmonology reveal most recent visit 01/2017. PFT at that time with showed severe obstructive ventilatory defect, air trapping and mildly reduced diffusion capacity - continuenebs with Brovana and Pulmicort  -will need OP pulm referral  Essential hypertension -resume home meds  Normocytic anemia Hemoglobin stable, outpatient follow-up recommended.  Hyperglycemia: likely related to steroids -carb mod diet  The patient is seen in follow-up today and note has had her oxygen system switch back to [place].  She now has an inogen portable oxygen concentrator.  She runs 3 L continuous.  Her saturations remained in the mid 90s on this.  She states she is still dyspneic with minimal exertion.  She was on  Anoro but it was not covered by her insurance and we switched her to Spiriva she is not doing as well.  She states mucus gets stuck in her  throat.  There is no chest pain.  She is sleeping well.  08/19/2019 Last seen 01/2019 Pt with more dyspnea, cough, no chest pain.  Notes more phlegm is present.  Needs a pulse ox. BP ok    On 4L oxygen.   Cannot tolerate prednisone or inhaled steroid.  Gold D Copd stage D  09/15/2019 Since the last visit the patient's cough has returned.  She is coughing up more thick mucus.  She has wheezing but no fever or chest pain.  She is having more dyspnea.  She states the antibiotic at the last visit was beneficial.  She is maintaining the Anoro and the albuterol nebulizer   Shortness of Breath This is a chronic problem. The current episode started more than 1 year ago. The problem occurs daily. The problem has been gradually worsening. Associated symptoms include chest pain, sputum production and wheezing. Pertinent negatives include no fever or leg swelling. The symptoms are aggravated by lying flat and exercise.    Past Medical History:  Diagnosis Date  . Arthritis   . Asthma   . Cancer ([place])   . Chronic respiratory failure with hypoxia and hypercapnia (HCC)   . COPD (chronic obstructive pulmonary disease) ([place])   . Hypertension      Family History  Problem Relation Age of Onset  . Hypertension Mother   . Heart disease Mother   . Cancer Mother   . Asthma Father   . Asthma Sister   . Hypertension Sister   . Asthma Brother   . Cancer Brother      Social History   Socioeconomic History  . Marital status: Single    Spouse name: Not on file  . Number of children: Not on file  . Years of education: Not on file  . Highest education level: Not on file  Occupational History  . Not on file  Tobacco Use  . Smoking status: Former Research scientist (life sciences)  . Smokeless tobacco: Never Used  . Tobacco comment: quit smoking in 2018    Substance and Sexual Activity  . Alcohol use: Not Currently  . Drug use: Not Currently  . Sexual activity: Not on file  Other Topics Concern  . Not on file  Social  History Narrative  . Not on file   Social Determinants of Health   Financial Resource Strain:   . Difficulty of Paying Living Expenses: Not on file  Food Insecurity:   . Worried About Charity fundraiser in the Last Year: Not on file  . Ran Out of Food in the Last Year: Not on file  Transportation Needs:   . Lack of Transportation (Medical): Not on file  . Lack of Transportation (Non-Medical): Not on file  Physical Activity:   . Days of Exercise per Week: Not on file  . Minutes of Exercise per Session: Not on file  Stress:   . Feeling of Stress : Not on file  Social Connections:   . Frequency of Communication with Friends and Family: Not on file  . Frequency of Social Gatherings with Friends and Family: Not on file  . Attends Religious Services: Not on file  . Active Member of Clubs or Organizations: Not on file  . Attends Archivist Meetings: Not on file  . Marital Status: Not on file  Intimate  Partner Violence:   . Fear of Current or Ex-Partner: Not on file  . Emotionally Abused: Not on file  . Physically Abused: Not on file  . Sexually Abused: Not on file     No Known Allergies   Outpatient Medications Prior to Visit  Medication Sig Dispense Refill  . albuterol (PROVENTIL) (2.5 MG/3ML) 0.083% nebulizer solution Take 3 mLs (2.5 mg total) by nebulization 3 (three) times daily. 270 mL 1  . albuterol (VENTOLIN HFA) 108 (90 Base) MCG/ACT inhaler INHALE 1 TO 2 PUFFS INTO THE LUNGS EVERY 6 HOURS AS NEEDED FOR WHEEZING OR SHORTNESS OF BREATH 6.7 g 2  . amLODipine (NORVASC) 5 MG tablet TAKE 1 TABLET(5 MG) BY MOUTH DAILY 90 tablet 0  . Blood Pressure Monitoring (BLOOD PRESSURE MONITOR AUTOMAT) DEVI Measure blood pressure and pulse daily 1 Device 0  . ibuprofen (ADVIL) 600 MG tablet TAKE 1 TABLET(600 MG) BY MOUTH EVERY 6 HOURS AS NEEDED 60 tablet 0  . lisinopril (ZESTRIL) 40 MG tablet Take 1 tablet (40 mg total) by mouth daily. 90 tablet 1  . loratadine (CLARITIN) 10 MG  tablet Take 1 tablet (10 mg total) by mouth daily. 30 tablet 3  . Misc. Devices (PULSE OXIMETER FOR FINGER) MISC 1 application by Does not apply route daily as needed. 1 each 0  . umeclidinium-vilanterol (ANORO ELLIPTA) 62.5-25 MCG/INH AEPB Inhale 1 puff into the lungs daily. 60 each 3  . Vitamin D, Ergocalciferol, (DRISDOL) 1.25 MG (50000 UT) CAPS capsule TAKE 1 CAPSULE BY MOUTH EVERY 7 DAYS 4 capsule 2  . cefdinir (OMNICEF) 300 MG capsule Take 1 capsule (300 mg total) by mouth 2 (two) times daily. 14 capsule 0   No facility-administered medications prior to visit.     Review of Systems  Constitutional: Positive for activity change and fatigue. Negative for fever.  HENT: Negative.   Eyes: Negative.   Respiratory: Positive for cough, sputum production, chest tightness, shortness of breath and wheezing.   Cardiovascular: Positive for chest pain. Negative for palpitations and leg swelling.  Gastrointestinal: Negative.   Endocrine: Negative.   Genitourinary: Negative.   Musculoskeletal: Negative.   Allergic/Immunologic: Negative.   Neurological: Negative.   Hematological: Negative.   Psychiatric/Behavioral: Negative.        Objective:   Physical Exam There were no vitals filed for this visit. No exam , telephone visit   BMP Latest Ref Rng & Units 11/04/2018 11/03/2018 10/28/2018  Glucose 70 - 99 mg/dL 184(H) 165(H) -  BUN 8 - 23 mg/dL 19 19 -  Creatinine 0.44 - 1.00 mg/dL 0.80 0.81 -  Sodium 135 - 145 mmol/L 138 135 139  Potassium 3.5 - 5.1 mmol/L 4.2 3.9 3.3(L)  Chloride 98 - 111 mmol/L 98 99 -  CO2 22 - 32 mmol/L 33(H) 26 -  Calcium 8.9 - 10.3 mg/dL 9.1 9.0 -   CBC Latest Ref Rng & Units 11/04/2018 10/28/2018 10/28/2018  WBC 4.0 - 10.5 K/uL 13.6(H) - 7.1  Hemoglobin 12.0 - 15.0 g/dL 10.8(L) 11.9(L) 11.9(L)  Hematocrit 36.0 - 46.0 % 35.1(L) 35.0(L) 38.5  Platelets 150 - 400 K/uL 308 - 343  CXR: 3/18 IMPRESSION: No acute abnormality seen. ABG  FIO2 1.0:    Component Value  Date/Time   PHART 7.320 (L) 10/28/2018 0255   PCO2ART 62.8 (H) 10/28/2018 0255   PO2ART 196.0 (H) 10/28/2018 0255   HCO3 32.3 (H) 10/28/2018 0255   TCO2 34 (H) 10/28/2018 0255   O2SAT 100.0 10/28/2018 0255  Pulmonary functions  have been obtained earlier in 2019 by her local pulmonologist in [facility].  This revealed an FEV1 of 25% predicted and diffusion capacity of 20% predicted.  This places the patient in the Gold stage D COPD category with significant advanced emphysema.     Assessment & Plan:  I personally reviewed all images and lab data in the [facility] system as well as any outside material available during this office visit and agree with the  radiology impressions.   COPD GOLD D (chronic obstructive pulmonary disease) (HCC) Gold stage D COPD with increased airway inflammation  The patient cannot tolerate systemic prednisone  We will begin Omnicef 300 mg twice daily for 10 days  The patient to continue her current nebulizer inhaled medication program  Also gave her prescription for month promethazine dextromethorphan cough syrup   Diagnoses and all orders for this visit:  Mixed simple and mucopurulent chronic bronchitis ([place])  Other orders -     cefdinir (OMNICEF) 300 MG capsule; Take 1 capsule (300 mg total) by mouth 2 (two) times daily for 10 days. -     promethazine-dextromethorphan (PROMETHAZINE-DM) 6.25-15 MG/5ML syrup; Take 5 mLs by mouth 4 (four) times daily as needed for cough.    Follow Up Instructions: Knows a video visit will be established in 3 to 4 weeks   I discussed the assessment and treatment plan with the patient. The patient was provided an opportunity to ask questions and all were answered. The patient agreed with the plan and demonstrated an understanding of the instructions.   The patient was advised to call back or seek an in-person evaluation if the symptoms worsen or if the condition fails to improve as anticipated.  I provided 30 minutes  of non-face-to-face time during this encounter  including  median intraservice time , review of notes, labs, imaging, medications  and explaining diagnosis and management to the patient .    Asencion Noble, MD

## 2019-09-15 NOTE — Assessment & Plan Note (Signed)
Gold stage D COPD with increased airway inflammation  The patient cannot tolerate systemic prednisone  We will begin Omnicef 300 mg twice daily for 10 days  The patient to continue her current nebulizer inhaled medication program  Also gave her prescription for month promethazine dextromethorphan cough syrup

## 2019-09-22 ENCOUNTER — Encounter: Payer: Self-pay | Admitting: Critical Care Medicine

## 2019-09-22 ENCOUNTER — Telehealth (HOSPITAL_BASED_OUTPATIENT_CLINIC_OR_DEPARTMENT_OTHER): Payer: Medicaid Other | Admitting: Critical Care Medicine

## 2019-09-22 DIAGNOSIS — I10 Essential (primary) hypertension: Secondary | ICD-10-CM

## 2019-09-22 DIAGNOSIS — J418 Mixed simple and mucopurulent chronic bronchitis: Secondary | ICD-10-CM

## 2019-09-22 DIAGNOSIS — J9612 Chronic respiratory failure with hypercapnia: Secondary | ICD-10-CM

## 2019-09-22 DIAGNOSIS — J9611 Chronic respiratory failure with hypoxia: Secondary | ICD-10-CM

## 2019-09-22 MED ORDER — DEXTROMETHORPHAN HBR 15 MG/5ML PO SYRP: 10.0000 mL | ORAL_SOLUTION | Freq: Four times a day (QID) | ORAL | 3 refills | Status: DC | PRN

## 2019-09-22 MED ORDER — VITAMIN D (ERGOCALCIFEROL) 1.25 MG (50000 UNIT) PO CAPS: ORAL_CAPSULE | ORAL | 2 refills | Status: DC

## 2019-09-22 MED ORDER — UMECLIDINIUM-VILANTEROL 62.5-25 MCG/INH IN AEPB: 1.0000 | INHALATION_SPRAY | Freq: Every day | RESPIRATORY_TRACT | 3 refills | Status: DC

## 2019-09-22 MED ORDER — IBUPROFEN 600 MG PO TABS: ORAL_TABLET | ORAL | 0 refills | Status: DC

## 2019-09-22 MED ORDER — LISINOPRIL 40 MG PO TABS: 40.0000 mg | ORAL_TABLET | Freq: Every day | ORAL | 1 refills | Status: DC

## 2019-09-22 MED ORDER — AMLODIPINE BESYLATE 5 MG PO TABS: ORAL_TABLET | ORAL | 1 refills | Status: DC

## 2019-09-22 NOTE — Assessment & Plan Note (Signed)
Chronic respiratory failure stable at this time on current oxygen prescription

## 2019-09-22 NOTE — Assessment & Plan Note (Signed)
Hypertension under good control at this time with home blood pressure monitoring will continue the amlodipine and lisinopril and provide refills

## 2019-09-22 NOTE — Progress Notes (Signed)
Subjective:    Patient ID: Bianca Murray, female    DOB: Jun 01, 1956, 64 y.o.   MRN: [id number] Virtual Visit via Video Note  I connected with Bianca Murray on 09/22/19 at  2:30 PM EST by video and verified that I am speaking with the correct person using two identifiers.   Consent:  I discussed the limitations, risks, security and privacy concerns of performing an evaluation and management service by video and the availability of in person appointments. I also discussed with the patient that there may be a patient responsible charge related to this service. The patient expressed understanding and agreed to proceed.  Location of patient: pt at home   Location of provider: I am in my office  Persons participating in the televisit with the patient.   Pts daughter Bianca Murray on the call  History of Present Illness: This is a 64 year old female with advanced chronic obstructive lung disease.  Pulmonary functions have been obtained earlier in 2019 by her local pulmonologist in [facility].  This revealed an FEV1 of 25% predicted diffusion capacity of 20% predicted.  This places the patient in the Gold stage D COPD category with significant advanced emphysema. The patient did remove recently from her home in [place] to be closer to her daughter and is now staying in her daughter's home in this move occurred in January 2020.  Since that time the patient began having viral symptoms of an upper respiratory tract infection and was admitted to the hospital on March 17 being discharged on the 25th.  Below are excerpts from the discharge summary  Admit date: 10/28/2018 Discharge date: 11/05/2018  Discharge Diagnosis:  Principal Problem:   Acute on chronic respiratory failure with hypoxia and hypercapnia (HCC) Active Problems:   COPD with acute exacerbation (HCC)   COPD (chronic obstructive pulmonary disease) (HCC)   Chronic respiratory failure with hypoxia and hypercapnia (HCC)    Hyperglycemia  History of Present Illness:  Bianca Murray a 64 y.o.femalewith medical history significant ofO2 dependent COPD, Asthma. She has required intubation and tracheostomy in the past (though this has been reversed). She moved down to the area a few weeks ago from [place]. No known sick contacts. For the past 4-5 days she has had increased SOB, cough, wheezing. This has been gradually worsening since onset. She is on 3-4L O2 at baseline.   Hospital Course by Problem:  Acute on chronic hypoxic and hypercapnic respiratory failure Secondary to COPD.Slight improvement today.Sats 96%on 3L.Explained to patient needto keep sat between 89-92%.Patientcontinues to feel anxietyand reports worsening sob when oxygen weaned. Improved air movement with less wheeze today -will weansolumedrol from 60mg  every 6hours to evey 8 hours -continue towean oxygen -may need  COPD exacerbation Chestx-ray with no acute abnormality. Hx of ESLD has been evaluated to lung transplant.Patient was unable to tolerate BiPAP.notes from pulmonology reveal most recent visit 01/2017. PFT at that time with showed severe obstructive ventilatory defect, air trapping and mildly reduced diffusion capacity - continuenebs with Brovana and Pulmicort  -will need OP pulm referral  Essential hypertension -resume home meds  Normocytic anemia Hemoglobin stable, outpatient follow-up recommended.  Hyperglycemia: likely related to steroids -carb mod diet  The patient is seen in follow-up today and note has had her oxygen system switch back to [place].  She now has an inogen portable oxygen concentrator.  She runs 3 L continuous.  Her saturations remained in the mid 90s on this.  She states she is still dyspneic with minimal exertion.  She  was on Anoro but it was not covered by her insurance and we switched her to Spiriva she is not doing as well.  She states mucus gets stuck in her  throat.  There is no chest pain.  She is sleeping well.  08/19/2019 Last seen 01/2019 Pt with more dyspnea, cough, no chest pain.  Notes more phlegm is present.  Needs a pulse ox. BP ok    On 4L oxygen.   Cannot tolerate prednisone or inhaled steroid.  Gold D Copd stage D  09/15/2019 Since the last visit the patient's cough has returned.  She is coughing up more thick mucus.  She has wheezing but no fever or chest pain.  She is having more dyspnea.  She states the antibiotic at the last visit was beneficial.  She is maintaining the Anoro and the albuterol nebulizer  09/22/2019 Since the last visit the patient is improved she has less cough less mucus production the mucus is clear she denies chest pain.  She does need refills on her amlodipine vitamin D and ibuprofen   Shortness of Breath This is a chronic problem. The current episode started more than 1 year ago. The problem occurs daily. The problem has been gradually worsening. Associated symptoms include sputum production. Pertinent negatives include no chest pain, fever, leg swelling or wheezing. The symptoms are aggravated by lying flat and exercise.    Past Medical History:  Diagnosis Date  . Arthritis   . Asthma   . Cancer ([place])   . Chronic respiratory failure with hypoxia and hypercapnia (HCC)   . COPD (chronic obstructive pulmonary disease) ([place])   . Hypertension      Family History  Problem Relation Age of Onset  . Hypertension Mother   . Heart disease Mother   . Cancer Mother   . Asthma Father   . Asthma Sister   . Hypertension Sister   . Asthma Brother   . Cancer Brother      Social History   Socioeconomic History  . Marital status: Single    Spouse name: Not on file  . Number of children: Not on file  . Years of education: Not on file  . Highest education level: Not on file  Occupational History  . Not on file  Tobacco Use  . Smoking status: Former Research scientist (life sciences)  . Smokeless tobacco: Never Used  . Tobacco  comment: quit smoking in 2018    Substance and Sexual Activity  . Alcohol use: Not Currently  . Drug use: Not Currently  . Sexual activity: Not on file  Other Topics Concern  . Not on file  Social History Narrative  . Not on file   Social Determinants of Health   Financial Resource Strain:   . Difficulty of Paying Living Expenses: Not on file  Food Insecurity:   . Worried About Charity fundraiser in the Last Year: Not on file  . Ran Out of Food in the Last Year: Not on file  Transportation Needs:   . Lack of Transportation (Medical): Not on file  . Lack of Transportation (Non-Medical): Not on file  Physical Activity:   . Days of Exercise per Week: Not on file  . Minutes of Exercise per Session: Not on file  Stress:   . Feeling of Stress : Not on file  Social Connections:   . Frequency of Communication with Friends and Family: Not on file  . Frequency of Social Gatherings with Friends and Family: Not on file  .  Attends Religious Services: Not on file  . Active Member of Clubs or Organizations: Not on file  . Attends Archivist Meetings: Not on file  . Marital Status: Not on file  Intimate Partner Violence:   . Fear of Current or Ex-Partner: Not on file  . Emotionally Abused: Not on file  . Physically Abused: Not on file  . Sexually Abused: Not on file     No Known Allergies   Outpatient Medications Prior to Visit  Medication Sig Dispense Refill  . albuterol (VENTOLIN HFA) 108 (90 Base) MCG/ACT inhaler INHALE 1 TO 2 PUFFS INTO THE LUNGS EVERY 6 HOURS AS NEEDED FOR WHEEZING OR SHORTNESS OF BREATH 6.7 g 2  . Blood Pressure Monitoring (BLOOD PRESSURE MONITOR AUTOMAT) DEVI Measure blood pressure and pulse daily 1 Device 0  . cefdinir (OMNICEF) 300 MG capsule Take 1 capsule (300 mg total) by mouth 2 (two) times daily for 10 days. 20 capsule 0  . loratadine (CLARITIN) 10 MG tablet Take 1 tablet (10 mg total) by mouth daily. 30 tablet 3  . Misc. Devices (PULSE  OXIMETER FOR FINGER) MISC 1 application by Does not apply route daily as needed. 1 each 0  . amLODipine (NORVASC) 5 MG tablet TAKE 1 TABLET(5 MG) BY MOUTH DAILY 90 tablet 0  . ibuprofen (ADVIL) 600 MG tablet TAKE 1 TABLET(600 MG) BY MOUTH EVERY 6 HOURS AS NEEDED 60 tablet 0  . lisinopril (ZESTRIL) 40 MG tablet Take 1 tablet (40 mg total) by mouth daily. 90 tablet 1  . promethazine-dextromethorphan (PROMETHAZINE-DM) 6.25-15 MG/5ML syrup Take 5 mLs by mouth 4 (four) times daily as needed for cough. 240 mL 0  . umeclidinium-vilanterol (ANORO ELLIPTA) 62.5-25 MCG/INH AEPB Inhale 1 puff into the lungs daily. 60 each 3  . Vitamin D, Ergocalciferol, (DRISDOL) 1.25 MG (50000 UT) CAPS capsule TAKE 1 CAPSULE BY MOUTH EVERY 7 DAYS 4 capsule 2  . albuterol (PROVENTIL) (2.5 MG/3ML) 0.083% nebulizer solution Take 3 mLs (2.5 mg total) by nebulization 3 (three) times daily. 270 mL 1   No facility-administered medications prior to visit.     Review of Systems  Constitutional: Positive for activity change and fatigue. Negative for fever.  HENT: Negative.   Eyes: Negative.   Respiratory: Positive for cough, sputum production, chest tightness and shortness of breath. Negative for wheezing.   Cardiovascular: Negative for chest pain, palpitations and leg swelling.  Gastrointestinal: Negative.   Endocrine: Negative.   Genitourinary: Negative.   Musculoskeletal: Negative.   Allergic/Immunologic: Negative.   Neurological: Negative.   Hematological: Negative.   Psychiatric/Behavioral: Negative.        Objective:   Physical Exam There were no vitals filed for this visit. No exam , video visit  Note I could see the patient on the video and she is in no acute distress  BMP Latest Ref Rng & Units 11/04/2018 11/03/2018 10/28/2018  Glucose 70 - 99 mg/dL 184(H) 165(H) -  BUN 8 - 23 mg/dL 19 19 -  Creatinine 0.44 - 1.00 mg/dL 0.80 0.81 -  Sodium 135 - 145 mmol/L 138 135 139  Potassium 3.5 - 5.1 mmol/L 4.2 3.9  3.3(L)  Chloride 98 - 111 mmol/L 98 99 -  CO2 22 - 32 mmol/L 33(H) 26 -  Calcium 8.9 - 10.3 mg/dL 9.1 9.0 -   CBC Latest Ref Rng & Units 11/04/2018 10/28/2018 10/28/2018  WBC 4.0 - 10.5 K/uL 13.6(H) - 7.1  Hemoglobin 12.0 - 15.0 g/dL 10.8(L) 11.9(L) 11.9(L)  Hematocrit  36.0 - 46.0 % 35.1(L) 35.0(L) 38.5  Platelets 150 - 400 K/uL 308 - 343  CXR: 3/18 IMPRESSION: No acute abnormality seen. ABG  FIO2 1.0:    Component Value Date/Time   PHART 7.320 (L) 10/28/2018 0255   PCO2ART 62.8 (H) 10/28/2018 0255   PO2ART 196.0 (H) 10/28/2018 0255   HCO3 32.3 (H) 10/28/2018 0255   TCO2 34 (H) 10/28/2018 0255   O2SAT 100.0 10/28/2018 0255  Pulmonary functions have been obtained earlier in 2019 by her local pulmonologist in [facility].  This revealed an FEV1 of 25% predicted and diffusion capacity of 20% predicted.  This places the patient in the Gold stage D COPD category with significant advanced emphysema.     Assessment & Plan:  I personally reviewed all images and lab data in the [facility] system as well as any outside material available during this office visit and agree with the  radiology impressions.   COPD GOLD D (chronic obstructive pulmonary disease) (HCC) Gold COPD with improvement in overall function with recent course of antibiotics  We will continue her inhaler medications at this time and provide refills and provide the patient with dextromethorphan cough syrup  Hypertension Hypertension under good control at this time with home blood pressure monitoring will continue the amlodipine and lisinopril and provide refills  Chronic respiratory failure with hypoxia and hypercapnia (HCC) Chronic respiratory failure stable at this time on current oxygen prescription   Diagnoses and all orders for this visit:  Mixed simple and mucopurulent chronic bronchitis (HCC)  Essential hypertension  Chronic respiratory failure with hypoxia and hypercapnia (HCC)  Other orders -      amLODipine (NORVASC) 5 MG tablet; TAKE 1 TABLET(5 MG) BY MOUTH DAILY -     ibuprofen (ADVIL) 600 MG tablet; TAKE 1 TABLET(600 MG) BY MOUTH EVERY 6 HOURS AS NEEDED -     lisinopril (ZESTRIL) 40 MG tablet; Take 1 tablet (40 mg total) by mouth daily. -     umeclidinium-vilanterol (ANORO ELLIPTA) 62.5-25 MCG/INH AEPB; Inhale 1 puff into the lungs daily. -     Vitamin D, Ergocalciferol, (DRISDOL) 1.25 MG (50000 UNIT) CAPS capsule; TAKE 1 CAPSULE BY MOUTH EVERY 7 DAYS -     dextromethorphan 15 MG/5ML syrup; Take 10 mLs (30 mg total) by mouth 4 (four) times daily as needed for cough.    Follow Up Instructions: Knows a video visit will be established in 3 to 4 weeks   I discussed the assessment and treatment plan with the patient. The patient was provided an opportunity to ask questions and all were answered. The patient agreed with the plan and demonstrated an understanding of the instructions.   The patient was advised to call back or seek an in-person evaluation if the symptoms worsen or if the condition fails to improve as anticipated.  I provided 30 minutes of non-face-to-face time during this encounter  including  median intraservice time , review of notes, labs, imaging, medications  and explaining diagnosis and management to the patient .    Asencion Noble, MD

## 2019-09-22 NOTE — Assessment & Plan Note (Signed)
Gold COPD with improvement in overall function with recent course of antibiotics  We will continue her inhaler medications at this time and provide refills and provide the patient with dextromethorphan cough syrup

## 2019-09-25 ENCOUNTER — Telehealth: Payer: Self-pay | Admitting: Critical Care Medicine

## 2019-09-25 MED ORDER — PROMETHAZINE-DM 6.25-15 MG/5ML PO SYRP: 5.0000 mL | ORAL_SOLUTION | Freq: Four times a day (QID) | ORAL | 0 refills | Status: DC | PRN

## 2019-09-25 NOTE — Telephone Encounter (Signed)
I spoke to the patient's daughter and she will obtain Promethazine DM for the patient and I will resend the prescription to the [facility] as for her spotting of the blood I spoke to the patient's daughter and she is made an appointment with the patient for gynecology

## 2019-10-30 ENCOUNTER — Ambulatory Visit: Payer: Medicare Other | Admitting: Obstetrics and Gynecology

## 2019-11-17 ENCOUNTER — Other Ambulatory Visit: Payer: Self-pay

## 2019-11-17 MED ORDER — IBUPROFEN 600 MG PO TABS: ORAL_TABLET | ORAL | 0 refills | Status: DC

## 2019-11-20 ENCOUNTER — Ambulatory Visit: Payer: Medicare Other | Admitting: Obstetrics and Gynecology

## 2019-12-10 ENCOUNTER — Encounter: Payer: Self-pay | Admitting: Critical Care Medicine

## 2019-12-10 ENCOUNTER — Other Ambulatory Visit: Payer: Self-pay

## 2019-12-10 ENCOUNTER — Ambulatory Visit: Payer: Medicare Other | Attending: Critical Care Medicine | Admitting: Critical Care Medicine

## 2019-12-10 DIAGNOSIS — E559 Vitamin D deficiency, unspecified: Secondary | ICD-10-CM | POA: Diagnosis not present

## 2019-12-10 DIAGNOSIS — J9612 Chronic respiratory failure with hypercapnia: Secondary | ICD-10-CM

## 2019-12-10 DIAGNOSIS — J9611 Chronic respiratory failure with hypoxia: Secondary | ICD-10-CM | POA: Diagnosis not present

## 2019-12-10 DIAGNOSIS — R140 Abdominal distension (gaseous): Secondary | ICD-10-CM | POA: Insufficient documentation

## 2019-12-10 DIAGNOSIS — Z87891 Personal history of nicotine dependence: Secondary | ICD-10-CM

## 2019-12-10 DIAGNOSIS — I10 Essential (primary) hypertension: Secondary | ICD-10-CM

## 2019-12-10 DIAGNOSIS — J418 Mixed simple and mucopurulent chronic bronchitis: Secondary | ICD-10-CM

## 2019-12-10 DIAGNOSIS — J432 Centrilobular emphysema: Secondary | ICD-10-CM

## 2019-12-10 MED ORDER — SIMETHICONE 80 MG PO CHEW: 80.0000 mg | CHEWABLE_TABLET | Freq: Three times a day (TID) | ORAL | 1 refills | Status: DC

## 2019-12-10 MED ORDER — CLONAZEPAM 0.5 MG PO TABS: 0.5000 mg | ORAL_TABLET | Freq: Every day | ORAL | 0 refills | Status: DC | PRN

## 2019-12-10 MED ORDER — VITAMIN D (ERGOCALCIFEROL) 1.25 MG (50000 UNIT) PO CAPS: ORAL_CAPSULE | ORAL | 2 refills | Status: DC

## 2019-12-10 MED ORDER — IBUPROFEN 600 MG PO TABS: ORAL_TABLET | ORAL | 0 refills | Status: DC

## 2019-12-10 MED ORDER — UMECLIDINIUM-VILANTEROL 62.5-25 MCG/INH IN AEPB: 1.0000 | INHALATION_SPRAY | Freq: Every day | RESPIRATORY_TRACT | 3 refills | Status: DC

## 2019-12-10 MED ORDER — PROMETHAZINE-DM 6.25-15 MG/5ML PO SYRP: 5.0000 mL | ORAL_SOLUTION | Freq: Four times a day (QID) | ORAL | 0 refills | Status: DC | PRN

## 2019-12-10 MED ORDER — SENNOSIDES-DOCUSATE SODIUM 8.6-50 MG PO TABS
2.0000 | ORAL_TABLET | Freq: Every day | ORAL | 1 refills | Status: DC
Start: 2019-12-10 — End: 2020-03-22

## 2019-12-10 NOTE — Patient Instructions (Signed)
Take simethicone chews three times a day with meals  Take Senna kot S two at bedtime  A Covid Vaccine will be scheduled to be brought into your home for vaccination  Follow diet below and use a protein nutrition drink in between meals and eat smaller amounts for your main meals   Eating Plan for Chronic Obstructive Pulmonary Disease Chronic obstructive pulmonary disease (COPD) causes symptoms such as shortness of breath, coughing, and chest discomfort. These symptoms can make it difficult to eat enough to maintain a healthy weight. Generally, people with COPD should eat a diet that is high in calories, protein, and other nutrients to maintain body weight and to keep the lungs as healthy as possible. Depending on the medicines you take and other health conditions you may have, your health care provider may give you additional recommendations on what to eat or avoid. Talk with your health care provider about your goals for body weight, and work with a dietitian to develop an eating plan that is right for you. What are tips for following this plan? Reading food labels   Avoid foods with more than 300 milligrams (mg) of salt (sodium) per serving.  Choose foods that contain at least 4 grams (g) of fiber per serving. Try to eat 20-30 g of fiber each day.  Choose foods that are high in calories and protein, such as nuts, beans, yogurt, and cheese. Shopping  Do not buy foods labeled as diet, low-calorie, or low-fat.  If you are able to eat dairy products: ? Avoid low-fat or skim milk. ? Buy dairy products that have at least 2% fat.  Buy nutritional supplement drinks.  Buy grains and prepared foods labeled as enriched or fortified.  Consider buying low-sodium, pre-made foods to conserve energy for eating. Cooking  Add dry milk or protein powder to smoothies.  Cook with healthy fats, such as olive oil, canola oil, sunflower oil, and grapeseed oil.  Add oil, butter, cream cheese, or nut  butters to foods to increase fat and calories.  To make foods easier to chew and swallow: ? Cook vegetables, pasta, and rice until soft. ? Cut or grind meat into very small pieces. ? Dip breads in liquid. Meal planning   Eat when you feel hungry.  Eat 5-6 small meals throughout the day.  Drink 6-8 glasses of water each day.  Do not drink liquids with meals. Drink liquids at the end of the meal to avoid feeling full too quickly.  Eat a variety of fruits and vegetables every day.  Ask for assistance from family or friends with planning and preparing meals as needed.  Avoid foods that cause you to feel bloated, such as carbonated drinks, fried foods, beans, broccoli, cabbage, and apples.  For older adults, ask your local agency on aging whether you are eligible for meal assistance programs, such as Meals on Wheels. Lifestyle   Do not smoke.  Eat slowly. Take small bites and chew food well before swallowing.  Do not overeat. This may make it more difficult to breathe after eating.  Sit up while eating.  If needed, continue to use supplemental oxygen while eating.  Rest or relax for 30 minutes before and after eating.  Monitor your weight as told by your health care provider.  Exercise as told by your health care provider. What foods can I eat? Fruits All fresh, dried, canned, or frozen fruits that do not cause gas. Vegetables All fresh, canned (no salt added), or frozen vegetables  that do not cause gas. Grains Whole grain bread. Enriched whole grain pasta. Fortified whole grain cereals. Fortified rice. Quinoa. Meats and other proteins Lean meat. Poultry. Fish. Dried beans. Unsalted nuts. Tofu. Eggs. Nut butters. Dairy Whole or 2% milk. Cheese. Yogurt. Fats and oils Olive oil. Canola oil. Butter. Margarine. Beverages Water. Vegetable juice (no salt added). Decaffeinated coffee. Decaffeinated or herbal tea. Seasonings and condiments Fresh or dried herbs. Low-salt  or salt-free seasonings. Low-sodium soy sauce. The items listed above may not be a complete list of foods and beverages you can eat. Contact a dietitian for more information. What foods are not recommended? Fruits Fruits that cause gas, such as apples or melon. Vegetables Vegetables that cause gas, such as broccoli, Brussels sprouts, cabbage, cauliflower, and onions. Canned vegetables with added salt. Meats and other proteins Fried meat. Salt-cured meat. Processed meat. Dairy Fat-free or low-fat milk, yogurt, or cheese. Processed cheese. Beverages Carbonated drinks. Caffeinated drinks, such as coffee, tea, and soft drinks. Juice. Alcohol. Vegetable juice with added salt. Seasonings and condiments Salt. Seasoning mixes with salt. Soy sauce. Angie Fava. Other foods Clear soup or broth. Fried foods. Prepared frozen meals. The items listed above may not be a complete list of foods and beverages you should avoid. Contact a dietitian for more information. Summary  COPD symptoms can make it difficult to eat enough to maintain a healthy weight.  A COPD eating plan can help you maintain your body weight and keep your lungs as healthy as possible.  Eat a diet that is high in calories, protein, and other nutrients. Read labels to make sure that you are getting the right nutrients. Cook foods to make them easy to chew and swallow.  Eat 5-6 small meals throughout the day, and avoid foods that cause gas or make you feel bloated. This information is not intended to replace advice given to you by your health care provider. Make sure you discuss any questions you have with your health care provider. Document Revised: 11/20/2018 Document Reviewed: 10/15/2017 Elsevier Patient Education  [place].

## 2019-12-10 NOTE — Progress Notes (Signed)
Subjective:    Patient ID: Bianca Murray, female    DOB: 1955-08-20, 64 y.o.   MRN: [id number] Virtual Visit via Video Note  I connected with Bianca Murray on 12/10/19 at  9:30 AM EDT by telephone and verified that I am speaking with the correct person using two identifiers.   Consent:  I discussed the limitations, risks, security and privacy concerns of performing an evaluation and management service by telephone and the availability of in person appointments. I also discussed with the patient that there may be a patient responsible charge related to this service. The patient expressed understanding and agreed to proceed.  Location of patient: pt at home   Location of provider: I am in my office  Persons participating in the televisit with the patient.   No one else on the call, but I did speak to the patient's daughter later separately by phone  History of Present Illness: This is a 64 year old female with advanced chronic obstructive lung disease.  Pulmonary functions have been obtained earlier in 2019 by her local pulmonologist in [facility].  This revealed an FEV1 of 25% predicted diffusion capacity of 20% predicted.  This places the patient in the Gold stage D COPD category with significant advanced emphysema. The patient did remove recently from her home in [place] to be closer to her daughter and is now staying in her daughter's home in this move occurred in January 2020.  Since that time the patient began having viral symptoms of an upper respiratory tract infection and was admitted to the hospital on March 17 being discharged on the 25th.  Below are excerpts from the discharge summary  Admit date: 10/28/2018 Discharge date: 11/05/2018  Discharge Diagnosis:  Principal Problem:   Acute on chronic respiratory failure with hypoxia and hypercapnia (HCC) Active Problems:   COPD with acute exacerbation (HCC)   COPD (chronic obstructive pulmonary disease) (HCC)   Chronic  respiratory failure with hypoxia and hypercapnia (HCC)   Hyperglycemia  History of Present Illness:  Bianca Murray a 64 y.o.femalewith medical history significant ofO2 dependent COPD, Asthma. She has required intubation and tracheostomy in the past (though this has been reversed). She moved down to the area a few weeks ago from [place]. No known sick contacts. For the past 4-5 days she has had increased SOB, cough, wheezing. This has been gradually worsening since onset. She is on 3-4L O2 at baseline.   Hospital Course by Problem:  Acute on chronic hypoxic and hypercapnic respiratory failure Secondary to COPD.Slight improvement today.Sats 96%on 3L.Explained to patient needto keep sat between 89-92%.Patientcontinues to feel anxietyand reports worsening sob when oxygen weaned. Improved air movement with less wheeze today -will weansolumedrol from 60mg  every 6hours to evey 8 hours -continue towean oxygen -may need  COPD exacerbation Chestx-ray with no acute abnormality. Hx of ESLD has been evaluated to lung transplant.Patient was unable to tolerate BiPAP.notes from pulmonology reveal most recent visit 01/2017. PFT at that time with showed severe obstructive ventilatory defect, air trapping and mildly reduced diffusion capacity - continuenebs with Brovana and Pulmicort  -will need OP pulm referral  Essential hypertension -resume home meds  Normocytic anemia Hemoglobin stable, outpatient follow-up recommended.  Hyperglycemia: likely related to steroids -carb mod diet  The patient is seen in follow-up today and note has had her oxygen system switch back to [place].  She now has an inogen portable oxygen concentrator.  She runs 3 L continuous.  Her saturations remained in the mid 90s on this.  She states she is still dyspneic with minimal exertion.  She was on Anoro but it was not covered by her insurance and we switched her to Spiriva she is not doing as  well.  She states mucus gets stuck in her throat.  There is no chest pain.  She is sleeping well.  08/19/2019 Last seen 01/2019 Pt with more dyspnea, cough, no chest pain.  Notes more phlegm is present.  Needs a pulse ox. BP ok    On 4L oxygen.   Cannot tolerate prednisone or inhaled steroid.  Gold D Copd stage D  09/15/2019 Since the last visit the patient's cough has returned.  She is coughing up more thick mucus.  She has wheezing but no fever or chest pain.  She is having more dyspnea.  She states the antibiotic at the last visit was beneficial.  She is maintaining the Anoro and the albuterol nebulizer  12/10/2019 Video visit  Patient is seen in follow-up and states that her cough is markedly improved however she complains of abdominal discomfort increased gas in the stomach early satiety and also constipation he states the cough medicine is useful and would like a refill she maintains the Anoro 1 puff a day she is yet to receive her Covid vaccine.  She notes no belching but has panic attacks at times if she has to get ready in the morning.  She has no real reflux symptoms at this time.  She is now living with her daughter in a new location.     Shortness of Breath This is a chronic problem. The current episode started more than 1 year ago. The problem occurs daily. The problem has been gradually worsening. Associated symptoms include chest pain, sputum production and wheezing. Pertinent negatives include no fever or leg swelling. The symptoms are aggravated by lying flat and exercise.    Past Medical History:  Diagnosis Date  . Arthritis   . Asthma   . Cancer ([place])   . Chronic respiratory failure with hypoxia and hypercapnia (HCC)   . COPD (chronic obstructive pulmonary disease) ([place])   . Hypertension      Family History  Problem Relation Age of Onset  . Hypertension Mother   . Heart disease Mother   . Cancer Mother   . Asthma Father   . Asthma Sister   . Hypertension Sister    . Asthma Brother   . Cancer Brother      Social History   Socioeconomic History  . Marital status: Single    Spouse name: Not on file  . Number of children: Not on file  . Years of education: Not on file  . Highest education level: Not on file  Occupational History  . Not on file  Tobacco Use  . Smoking status: Former Research scientist (life sciences)  . Smokeless tobacco: Never Used  . Tobacco comment: quit smoking in 2018    Substance and Sexual Activity  . Alcohol use: Not Currently  . Drug use: Not Currently  . Sexual activity: Not on file  Other Topics Concern  . Not on file  Social History Narrative  . Not on file   Social Determinants of Health   Financial Resource Strain:   . Difficulty of Paying Living Expenses:   Food Insecurity:   . Worried About Charity fundraiser in the Last Year:   . Arboriculturist in the Last Year:   Transportation Needs:   . Film/video editor (Medical):   Marland Kitchen  Lack of Transportation (Non-Medical):   Physical Activity:   . Days of Exercise per Week:   . Minutes of Exercise per Session:   Stress:   . Feeling of Stress :   Social Connections:   . Frequency of Communication with Friends and Family:   . Frequency of Social Gatherings with Friends and Family:   . Attends Religious Services:   . Active Member of Clubs or Organizations:   . Attends Archivist Meetings:   Marland Kitchen Marital Status:   Intimate Partner Violence:   . Fear of Current or Ex-Partner:   . Emotionally Abused:   Marland Kitchen Physically Abused:   . Sexually Abused:      No Known Allergies   Outpatient Medications Prior to Visit  Medication Sig Dispense Refill  . albuterol (PROVENTIL) (2.5 MG/3ML) 0.083% nebulizer solution Take 3 mLs (2.5 mg total) by nebulization 3 (three) times daily. 270 mL 1  . albuterol (VENTOLIN HFA) 108 (90 Base) MCG/ACT inhaler INHALE 1 TO 2 PUFFS INTO THE LUNGS EVERY 6 HOURS AS NEEDED FOR WHEEZING OR SHORTNESS OF BREATH 6.7 g 2  . amLODipine (NORVASC) 5 MG tablet  TAKE 1 TABLET(5 MG) BY MOUTH DAILY 90 tablet 1  . Blood Pressure Monitoring (BLOOD PRESSURE MONITOR AUTOMAT) DEVI Measure blood pressure and pulse daily 1 Device 0  . ibuprofen (ADVIL) 600 MG tablet TAKE 1 TABLET(600 MG) BY MOUTH EVERY 6 HOURS AS NEEDED 60 tablet 0  . lisinopril (ZESTRIL) 40 MG tablet Take 1 tablet (40 mg total) by mouth daily. 90 tablet 1  . loratadine (CLARITIN) 10 MG tablet Take 1 tablet (10 mg total) by mouth daily. 30 tablet 3  . Misc. Devices (PULSE OXIMETER FOR FINGER) MISC 1 application by Does not apply route daily as needed. 1 each 0  . dextromethorphan 15 MG/5ML syrup Take 10 mLs (30 mg total) by mouth 4 (four) times daily as needed for cough. 120 mL 3  . promethazine-dextromethorphan (PROMETHAZINE-DM) 6.25-15 MG/5ML syrup Take 5 mLs by mouth 4 (four) times daily as needed for cough. 240 mL 0  . umeclidinium-vilanterol (ANORO ELLIPTA) 62.5-25 MCG/INH AEPB Inhale 1 puff into the lungs daily. 60 each 3  . Vitamin D, Ergocalciferol, (DRISDOL) 1.25 MG (50000 UNIT) CAPS capsule TAKE 1 CAPSULE BY MOUTH EVERY 7 DAYS 4 capsule 2   No facility-administered medications prior to visit.     Review of Systems  Constitutional: Positive for activity change and fatigue. Negative for fever.  HENT: Negative.   Eyes: Negative.   Respiratory: Positive for cough, sputum production, chest tightness, shortness of breath and wheezing.   Cardiovascular: Positive for chest pain. Negative for palpitations and leg swelling.  Gastrointestinal: Negative.   Endocrine: Negative.   Genitourinary: Negative.   Musculoskeletal: Negative.   Allergic/Immunologic: Negative.   Neurological: Negative.   Hematological: Negative.   Psychiatric/Behavioral: Negative.        Objective:   Physical Exam There were no vitals filed for this visit. No exam ,video visit  The pt is no distress , on oxygen on video  BMP Latest Ref Rng & Units 11/04/2018 11/03/2018 10/28/2018  Glucose 70 - 99 mg/dL 184(H)  165(H) -  BUN 8 - 23 mg/dL 19 19 -  Creatinine 0.44 - 1.00 mg/dL 0.80 0.81 -  Sodium 135 - 145 mmol/L 138 135 139  Potassium 3.5 - 5.1 mmol/L 4.2 3.9 3.3(L)  Chloride 98 - 111 mmol/L 98 99 -  CO2 22 - 32 mmol/L 33(H) 26 -  Calcium 8.9 - 10.3 mg/dL 9.1 9.0 -   CBC Latest Ref Rng & Units 11/04/2018 10/28/2018 10/28/2018  WBC 4.0 - 10.5 K/uL 13.6(H) - 7.1  Hemoglobin 12.0 - 15.0 g/dL 10.8(L) 11.9(L) 11.9(L)  Hematocrit 36.0 - 46.0 % 35.1(L) 35.0(L) 38.5  Platelets 150 - 400 K/uL 308 - 343  CXR: 3/18 IMPRESSION: No acute abnormality seen. ABG  FIO2 1.0:    Component Value Date/Time   PHART 7.320 (L) 10/28/2018 0255   PCO2ART 62.8 (H) 10/28/2018 0255   PO2ART 196.0 (H) 10/28/2018 0255   HCO3 32.3 (H) 10/28/2018 0255   TCO2 34 (H) 10/28/2018 0255   O2SAT 100.0 10/28/2018 0255  Pulmonary functions have been obtained earlier in 2019 by her local pulmonologist in [facility].  This revealed an FEV1 of 25% predicted and diffusion capacity of 20% predicted.  This places the patient in the Gold stage D COPD category with significant advanced emphysema.     Assessment & Plan:  I personally reviewed all images and lab data in the [facility] system as well as any outside material available during this office visit and agree with the  radiology impressions.   COPD GOLD D (chronic obstructive pulmonary disease) (HCC) Gold stage D COPD with chronic hypercarbia and hypoxemia oxygen dependent  Continue Anoro 1 puff daily and continue oxygen therapy as prescribed  Note the patient does have significant air trapping in the stomach and constipation will prescribe Senokot S2 nightly and also Mylicon chews 1 with each meal 3 times daily  Also patient will be given 0.5 mg of Klonopin once daily as needed for severe panic attacks this was discussed with the daughter  A homebound Covid vaccine clinic will be scheduled for this patient to receive vaccination in the home   Charmell was seen today for  copd.  Diagnoses and all orders for this visit:  Mixed simple and mucopurulent chronic bronchitis ([place])  Other orders -     promethazine-dextromethorphan (PROMETHAZINE-DM) 6.25-15 MG/5ML syrup; Take 5 mLs by mouth 4 (four) times daily as needed for cough. -     clonazePAM (KLONOPIN) 0.5 MG tablet; Take 1 tablet (0.5 mg total) by mouth daily as needed for anxiety. -     umeclidinium-vilanterol (ANORO ELLIPTA) 62.5-25 MCG/INH AEPB; Inhale 1 puff into the lungs daily. -     Vitamin D, Ergocalciferol, (DRISDOL) 1.25 MG (50000 UNIT) CAPS capsule; TAKE 1 CAPSULE BY MOUTH EVERY 7 DAYS    Follow Up Instructions: Knows a video visit will be established in 2 months and we will also arrange for a homebound visit for covid vaccination   I discussed the assessment and treatment plan with the patient. The patient was provided an opportunity to ask questions and all were answered. The patient agreed with the plan and demonstrated an understanding of the instructions.   The patient was advised to call back or seek an in-person evaluation if the symptoms worsen or if the condition fails to improve as anticipated.  I provided 30 minutes of non-face-to-face time during this encounter  including  median intraservice time , review of notes, labs, imaging, medications  and explaining diagnosis and management to the patient .    Asencion Noble, MD

## 2019-12-10 NOTE — Assessment & Plan Note (Signed)
Gold stage D COPD with chronic hypercarbia and hypoxemia oxygen dependent  Continue Anoro 1 puff daily and continue oxygen therapy as prescribed  Note the patient does have significant air trapping in the stomach and constipation will prescribe Senokot S2 nightly and also Mylicon chews 1 with each meal 3 times daily  Also patient will be given 0.5 mg of Klonopin once daily as needed for severe panic attacks this was discussed with the daughter  A homebound Covid vaccine clinic will be scheduled for this patient to receive vaccination in the home

## 2019-12-21 ENCOUNTER — Telehealth: Payer: Self-pay | Admitting: Critical Care Medicine

## 2019-12-21 ENCOUNTER — Other Ambulatory Visit (INDEPENDENT_AMBULATORY_CARE_PROVIDER_SITE_OTHER): Payer: Self-pay | Admitting: *Deleted

## 2019-12-21 MED ORDER — SIMETHICONE 80 MG PO CHEW: 80.0000 mg | CHEWABLE_TABLET | Freq: Three times a day (TID) | ORAL | 1 refills | Status: DC

## 2019-12-21 NOTE — Telephone Encounter (Signed)
Patient called in and requested to inform pcp that that the listed medications are allowing her to let out gas and soft stool. Patient stated that she is still having terrible stomach issues. Please follow up at your earliest convenience.  simethicone (GAS-X) 80 MG chewable tablet [id number]  senna-docusate (SENOKOT-S) 8.6-50 MG tablet [id number]

## 2019-12-31 ENCOUNTER — Ambulatory Visit: Payer: Medicare Other | Attending: Critical Care Medicine

## 2019-12-31 DIAGNOSIS — Z23 Encounter for immunization: Secondary | ICD-10-CM

## 2019-12-31 NOTE — Progress Notes (Signed)
   Covid-19 Vaccination Clinic  Name:  Bianca Murray    MRN: [id number] DOB: [birth year]  12/31/2019  [name] was observed post Covid-19 immunization for 15 minutes without incident. She was provided with Vaccine Information Sheet and instruction to access the V-Safe system.   [name] was instructed to call 911 with any severe reactions post vaccine: Marland Kitchen Difficulty breathing  . Swelling of face and throat  . A fast heartbeat  . A bad rash all over body  . Dizziness and weakness   Immunizations Administered    Name Date Dose VIS Date Route   Moderna COVID-19 Vaccine 12/31/2019 11:21 AM 0.5 mL 07/2019 Intramuscular   Manufacturer: Moderna   LotCA:[id number]   [id number]:[id number]

## 2020-01-05 ENCOUNTER — Other Ambulatory Visit: Payer: Self-pay

## 2020-01-05 MED ORDER — IBUPROFEN 600 MG PO TABS: ORAL_TABLET | ORAL | 0 refills | Status: DC

## 2020-01-18 ENCOUNTER — Telehealth: Payer: Self-pay

## 2020-01-18 NOTE — Telephone Encounter (Signed)
Patient states that she has been experiencing localized steady mid chest pain for the past 2 days with shortness of breath more than usual.   Patient has had tried to increase oxygen from 3L to 3.5-4L with little relief. Patient denies diff breathing ,dizziness, headache, blurry vision or fevers.  Patient denies congestion or flu like symptoms. BP 163/93 P 102. Unable to state her O2 sats. Stated she had taken her meds for today.  Patient advised that Dr Joya Gaskins would be notified and information would be relayed back to him.   Patient was told that she should look further evaluation in the UC/ED based on the experienced signs and symptoms.     Patient acknowledged understanding of advice

## 2020-01-18 NOTE — Telephone Encounter (Signed)
I tried to call the patient back, no answer,  If you could check on the patient later today to see if she went to UC?  Also please schedule her for f/u with me on Thursday this week as add on Video visit

## 2020-01-18 NOTE — Telephone Encounter (Signed)
Pt says symptoms with lump in chest that does not move no matter what position. Pt says not urgent & does not want to go to hospital or urgent care. pt says wants to have Dr. Joya Gaskins call her and advise. Confirmed pt ph [phone]

## 2020-01-20 ENCOUNTER — Telehealth: Payer: Self-pay

## 2020-01-20 NOTE — Telephone Encounter (Signed)
Yes the pt can take alka seltzer

## 2020-01-20 NOTE — Telephone Encounter (Signed)
Pt called asking is it safe to take alaseltzer with all other medications. Pt c/o warnings on label.

## 2020-01-21 ENCOUNTER — Telehealth: Payer: Self-pay | Admitting: Critical Care Medicine

## 2020-01-21 NOTE — Telephone Encounter (Signed)
called pt/ name and DOB verified/ made aware of MD note. Verbalized understanding

## 2020-01-21 NOTE — Telephone Encounter (Signed)
Patient was informed that she could take an [place] with her medications. Patient verbalized understanding and had no further questions or concerns at this time.

## 2020-01-28 ENCOUNTER — Ambulatory Visit: Payer: Medicare Other | Attending: Critical Care Medicine

## 2020-01-28 DIAGNOSIS — Z23 Encounter for immunization: Secondary | ICD-10-CM

## 2020-01-28 NOTE — Progress Notes (Signed)
   Covid-19 Vaccination Clinic  Name:  Bianca Murray    MRN: [id number] DOB: 1956/04/09  01/28/2020  [name] was observed post Covid-19 immunization for 15 minutes without incident. She was provided with Vaccine Information Sheet and instruction to access the V-Safe system.   [name] was instructed to call 911 with any severe reactions post vaccine: Marland Kitchen Difficulty breathing  . Swelling of face and throat  . A fast heartbeat  . A bad rash all over body  . Dizziness and weakness   Immunizations Administered    Name Date Dose VIS Date Route   Moderna COVID-19 Vaccine 01/28/2020 10:53 AM 0.5 mL 07/2019 Intramuscular   Manufacturer: Levan Hurst   Lot: [id number]   [place]: [id number]

## 2020-02-08 ENCOUNTER — Other Ambulatory Visit: Payer: Self-pay | Admitting: Critical Care Medicine

## 2020-02-08 NOTE — Telephone Encounter (Signed)
anoro - ran out, need asap Needs refill for albuterol - inhaler

## 2020-02-10 ENCOUNTER — Telehealth: Payer: Self-pay | Admitting: Critical Care Medicine

## 2020-02-10 ENCOUNTER — Telehealth: Payer: Self-pay

## 2020-02-10 NOTE — Telephone Encounter (Signed)
Signed CMN for disposable underpads  faxed to [facility].

## 2020-02-10 NOTE — Telephone Encounter (Signed)
Called to ask if the office could refax a Certificate of Medical Necessity to the Home Care because the original one that was faxed is illegible.  Please refax and call if you have any questions at [phone]

## 2020-02-10 NOTE — Telephone Encounter (Signed)
Bianca Murray,  This is the pt I gave you the form to fill out thanks

## 2020-02-11 ENCOUNTER — Telehealth: Payer: Self-pay | Admitting: Critical Care Medicine

## 2020-02-11 NOTE — Telephone Encounter (Signed)
Please advise 

## 2020-02-11 NOTE — Telephone Encounter (Signed)
The form was signed and sent to the DME provider yesterday.  This has been a back and forth process.   Opal Sidles any other DME we can get to get this pts urine pads STAT.  Thanks

## 2020-02-11 NOTE — Telephone Encounter (Signed)
Pt needs authorization done for her urine pads / Pt gave number to a home health place that advised her they sent order to the office but havent heard anything back/ Please advise

## 2020-02-12 ENCOUNTER — Telehealth: Payer: Self-pay

## 2020-02-12 MED ORDER — UMECLIDINIUM-VILANTEROL 62.5-25 MCG/INH IN AEPB: 1.0000 | INHALATION_SPRAY | Freq: Every day | RESPIRATORY_TRACT | 3 refills | Status: DC

## 2020-02-12 NOTE — Telephone Encounter (Signed)
Rx anoro sent to [facility]

## 2020-02-12 NOTE — Telephone Encounter (Signed)
I am working on a PA for [employer] Respimat and Bevespi HFA are preferred--would it be appropriate to change to either of these medications?

## 2020-02-12 NOTE — Addendum Note (Signed)
Addended by: Elsie Stain on: 02/12/2020 07:33 AM   Modules accepted: Orders

## 2020-02-15 MED ORDER — BEVESPI AEROSPHERE 9-4.8 MCG/ACT IN AERO: 2.0000 | INHALATION_SPRAY | Freq: Every day | RESPIRATORY_TRACT | 6 refills | Status: DC

## 2020-02-15 NOTE — Addendum Note (Signed)
Addended by: Elsie Stain on: 02/15/2020 08:29 AM   Modules accepted: Orders

## 2020-02-15 NOTE — Telephone Encounter (Signed)
Bevespri Rx sent to our pharmacy and kelly godwin working on this and d/c anoro

## 2020-02-15 NOTE — Telephone Encounter (Signed)
Bianca Murray is preferred .   New Rx sent

## 2020-02-16 ENCOUNTER — Telehealth: Payer: Self-pay

## 2020-02-16 DIAGNOSIS — J418 Mixed simple and mucopurulent chronic bronchitis: Secondary | ICD-10-CM

## 2020-02-16 NOTE — Telephone Encounter (Signed)
Spoke to daughter via phone-transferred Bevespi to [facility] on [address] per pt request.  Pt aware of $4 copay and replaces Anoro

## 2020-02-16 NOTE — Telephone Encounter (Signed)
Copied from [place] [phone]. Topic: General - Call Back - No Documentation >> Feb 10, 2020  3:28 PM Erick Blinks wrote: Pt's daughter called in regard to her Anoro Rx, pt has been without medication for 3 days. Pt would like to speak to nurse/PCP Best contact: [phone] >> Feb 12, 2020 12:38 PM Percell Belt A wrote: Pt called and stated that her ins is not covering this inhaler any longer.  She would like to know if there is another one that can be called in for her.  She has not called her ins to see what they cover.  Please advise  Pharmacy -[facility] on file  Best number for pt [phone]

## 2020-02-16 NOTE — Telephone Encounter (Signed)
I sent a Rx for mask for nebulizer will route to you

## 2020-02-16 NOTE — Telephone Encounter (Signed)
Order for nebulizer mask faxed to [facility] as patient requested.

## 2020-02-16 NOTE — Telephone Encounter (Signed)
Call placed to patient and informed her that Dr Joya Gaskins has d/c her anoro and prescribed bevespri. She said that her daughter has spoken to the [facility]  pharmacist. Instructed her to call her [facility] pharmacy to inquire if it is ready for pick up today. She has been out of it for over a week.   She is requesting a prescription for a mask for her nebulizer which can be sent to [facility].  She confirmed that she has the medication for the nebulizer and only needs the mask.   She also confirmed that she received an introductory packages of incontinence pads from [employer] but is considering changing to another company.

## 2020-02-18 ENCOUNTER — Other Ambulatory Visit: Payer: Self-pay | Admitting: Critical Care Medicine

## 2020-02-18 DIAGNOSIS — J9612 Chronic respiratory failure with hypercapnia: Secondary | ICD-10-CM

## 2020-02-18 DIAGNOSIS — J418 Mixed simple and mucopurulent chronic bronchitis: Secondary | ICD-10-CM

## 2020-02-18 MED ORDER — BEVESPI AEROSPHERE 9-4.8 MCG/ACT IN AERO: 2.0000 | INHALATION_SPRAY | Freq: Two times a day (BID) | RESPIRATORY_TRACT | 6 refills | Status: DC

## 2020-02-18 MED FILL — VENTOLIN HFA 90 MCG INHALER: 108 (90 BAS | 25 days supply | Qty: 18 | Fill #0

## 2020-02-24 ENCOUNTER — Other Ambulatory Visit: Payer: Self-pay | Admitting: Family Medicine

## 2020-02-24 ENCOUNTER — Telehealth: Payer: Self-pay | Admitting: Critical Care Medicine

## 2020-02-24 MED ORDER — MISC. DEVICES MISC
0 refills | Status: DC
Start: 2020-02-24 — End: 2020-02-24

## 2020-02-24 MED ORDER — MISC. DEVICES MISC: 0 refills | Status: DC

## 2020-02-24 NOTE — Telephone Encounter (Signed)
Please advise   Copied from [place] [phone]. Topic: General - Other >> Feb 24, 2020 11:28 AM Bianca Murray wrote: Reason for CRM: Patient called to inquire if Dr Joya Gaskins have ordered her Murray larger shower chair as yet. Also say that she will need Murray new nebulizer machine the one she have stopped working as of this morning Please contact patient with questions Ph# [phone]

## 2020-02-24 NOTE — Telephone Encounter (Signed)
Order for nebulizer and shower chair faxed to [facility]

## 2020-02-24 NOTE — Telephone Encounter (Signed)
Prescription has been written.

## 2020-02-24 NOTE — Telephone Encounter (Signed)
Call returned to patient. She explained that her nebulizer broke this morning. She requests an order for a new one be sent to [place]. Informed her that Huey Romans will need to verify her insurance coverage for a new one as she is not sure how long she had the current one. She said that she uses the nebulizer once -twice a day.   She was also in agreement to having the order for the shower chair sent to [facility].  Call placed to [facility], spoke to [facility] who said that they do not have a record of providing her with a nebulizer. She stated that they can check her insurance after receiving a prescription.   Prescription needed for new nebulizer.

## 2020-02-29 ENCOUNTER — Telehealth: Payer: Self-pay

## 2020-02-29 NOTE — Telephone Encounter (Signed)
Singed letter of medical necessity  faxed to [facility] for [facility].

## 2020-03-01 ENCOUNTER — Telehealth: Payer: Self-pay | Admitting: Critical Care Medicine

## 2020-03-01 ENCOUNTER — Telehealth: Payer: Self-pay

## 2020-03-01 ENCOUNTER — Encounter: Payer: Self-pay | Admitting: Critical Care Medicine

## 2020-03-01 MED ORDER — AEROCHAMBER MV MISC
0 refills | Status: DC
Start: 2020-03-01 — End: 2022-09-20

## 2020-03-01 NOTE — Telephone Encounter (Signed)
Call pt made appt .

## 2020-03-01 NOTE — Telephone Encounter (Signed)
Please advise.   Copied from [place] [phone]. Topic: General - Call Back - No Documentation >> Feb 29, 2020  4:33 PM Erick Blinks wrote: Reason for CRM: Pt called and is requesting call back from PCP. Has questions regarding medication Best contact: [phone]

## 2020-03-01 NOTE — Telephone Encounter (Signed)
I called the patient, she will get an aerochamber and I sent Rx to her [facility] pharmacy.  Her HFA technique was not good.  [place], Please schedule a video visit with her and her daughter soon

## 2020-03-01 NOTE — Telephone Encounter (Signed)
Call placed to [place], spoke to [place] who stated that the patient's nebulizer was delivered last week.  She also stated that they spoke to the patient and informed her that her insurance will not cover the shower chair and they do not have any in stock for purchase, she should check [facility]

## 2020-03-01 NOTE — Telephone Encounter (Signed)
Made call to patient / stated she does not do well with the new prescribed med Bevespi / She would like possible a new med but in the same form as Anoro [place]ipt[place]. Please advice! Thank you

## 2020-03-02 ENCOUNTER — Telehealth: Payer: Self-pay

## 2020-03-02 NOTE — Telephone Encounter (Signed)
Signed Letter of medical necessity for nebulizer faxed to [facility]

## 2020-03-03 ENCOUNTER — Other Ambulatory Visit: Payer: Self-pay | Admitting: Critical Care Medicine

## 2020-03-09 ENCOUNTER — Telehealth: Payer: Self-pay | Admitting: Critical Care Medicine

## 2020-03-09 NOTE — Telephone Encounter (Signed)
We will discuss at July 29th visit, unfortunately her disease is too diffuse in her lungs for the valve to work in my opinion

## 2020-03-09 NOTE — Telephone Encounter (Signed)
Please advise.  Copied from [place] [id number]. Topic: General - Inquiry >> Mar 08, 2020  4:11 PM Alanda Slim E wrote: Reason for CRM:Pt called and stated se is at the end stages of COPD and wanted to check with Dr. Joya Gaskins to see if she was a candidate for Zephyr valve treatment / please advise

## 2020-03-09 NOTE — Telephone Encounter (Signed)
Made pt aware

## 2020-03-10 ENCOUNTER — Other Ambulatory Visit: Payer: Self-pay

## 2020-03-10 ENCOUNTER — Ambulatory Visit: Payer: Medicare Other | Attending: Critical Care Medicine | Admitting: Critical Care Medicine

## 2020-03-10 ENCOUNTER — Encounter: Payer: Self-pay | Admitting: Critical Care Medicine

## 2020-03-10 DIAGNOSIS — J441 Chronic obstructive pulmonary disease with (acute) exacerbation: Secondary | ICD-10-CM | POA: Diagnosis not present

## 2020-03-10 DIAGNOSIS — J9612 Chronic respiratory failure with hypercapnia: Secondary | ICD-10-CM | POA: Diagnosis not present

## 2020-03-10 DIAGNOSIS — J418 Mixed simple and mucopurulent chronic bronchitis: Secondary | ICD-10-CM | POA: Diagnosis not present

## 2020-03-10 DIAGNOSIS — J9611 Chronic respiratory failure with hypoxia: Secondary | ICD-10-CM

## 2020-03-10 MED ORDER — LOSARTAN POTASSIUM 100 MG PO TABS: 100.0000 mg | ORAL_TABLET | Freq: Every day | ORAL | 3 refills | Status: DC

## 2020-03-10 MED ORDER — PREDNISONE 10 MG PO TABS: ORAL_TABLET | ORAL | 0 refills | Status: DC

## 2020-03-10 NOTE — Assessment & Plan Note (Signed)
Continue oxygen at 4 L

## 2020-03-10 NOTE — Assessment & Plan Note (Signed)
COPD with progressive airway inflammation plan is to pulse prednisone for 5 days continue inhaled medications as prescribed

## 2020-03-10 NOTE — Progress Notes (Signed)
Subjective:    Patient ID: Bianca Murray, female    DOB: July 08, 1956, 64 y.o.   MRN: [id number] Virtual Visit via Video Note  I connected with [name] on 03/10/20 at 10:20 AM EDT by telephone and verified that I am speaking with the correct person using two identifiers.   Consent:  I discussed the limitations, risks, security and privacy concerns of performing an evaluation and management service by telephone and the availability of in person appointments. I also discussed with the patient that there may be a patient responsible charge related to this service. The patient expressed understanding and agreed to proceed.  Location of patient: pt at home   Location of provider: I am in my office  Persons participating in the televisit with the patient.   No one else on the call,  History of Present Illness: This is a 64 year old female with advanced chronic obstructive lung disease.  Pulmonary functions have been obtained earlier in 2019 by her local pulmonologist in [facility].  This revealed an FEV1 of 25% predicted diffusion capacity of 20% predicted.  This places the patient in the Gold stage D COPD category with significant advanced emphysema. The patient did remove recently from her home in [place] to be closer to her daughter and is now staying in her daughter's home in this move occurred in January 2020.  Since that time the patient began having viral symptoms of an upper respiratory tract infection and was admitted to the hospital on March 17 being discharged on the 25th.  Below are excerpts from the discharge summary  Admit date: 10/28/2018 Discharge date: 11/05/2018  Discharge Diagnosis:  Principal Problem:   Acute on chronic respiratory failure with hypoxia and hypercapnia (HCC) Active Problems:   COPD with acute exacerbation (HCC)   COPD (chronic obstructive pulmonary disease) (HCC)   Chronic respiratory failure with hypoxia and hypercapnia (HCC)    Hyperglycemia  History of Present Illness:  Juliauna Stueve a 64 y.o.femalewith medical history significant ofO2 dependent COPD, Asthma. She has required intubation and tracheostomy in the past (though this has been reversed). She moved down to the area a few weeks ago from [place]. No known sick contacts. For the past 4-5 days she has had increased SOB, cough, wheezing. This has been gradually worsening since onset. She is on 3-4L O2 at baseline.   Hospital Course by Problem:  Acute on chronic hypoxic and hypercapnic respiratory failure Secondary to COPD.Slight improvement today.Sats 96%on 3L.Explained to patient needto keep sat between 89-92%.Patientcontinues to feel anxietyand reports worsening sob when oxygen weaned. Improved air movement with less wheeze today -will weansolumedrol from 60mg  every 6hours to evey 8 hours -continue towean oxygen -may need  COPD exacerbation Chestx-ray with no acute abnormality. Hx of ESLD has been evaluated to lung transplant.Patient was unable to tolerate BiPAP.notes from pulmonology reveal most recent visit 01/2017. PFT at that time with showed severe obstructive ventilatory defect, air trapping and mildly reduced diffusion capacity - continuenebs with Brovana and Pulmicort  -will need OP pulm referral  Essential hypertension -resume home meds  Normocytic anemia Hemoglobin stable, outpatient follow-up recommended.  Hyperglycemia: likely related to steroids -carb mod diet  The patient is seen in follow-up today and note has had her oxygen system switch back to [place].  She now has an inogen portable oxygen concentrator.  She runs 3 L continuous.  Her saturations remained in the mid 90s on this.  She states she is still dyspneic with minimal exertion.  She was  on Anoro but it was not covered by her insurance and we switched her to Spiriva she is not doing as well.  She states mucus gets stuck in her  throat.  There is no chest pain.  She is sleeping well.  08/19/2019 Last seen 01/2019 Pt with more dyspnea, cough, no chest pain.  Notes more phlegm is present.  Needs a pulse ox. BP ok    On 4L oxygen.   Cannot tolerate prednisone or inhaled steroid.  Gold D Copd stage D  09/15/2019 Since the last visit the patient's cough has returned.  She is coughing up more thick mucus.  She has wheezing but no fever or chest pain.  She is having more dyspnea.  She states the antibiotic at the last visit was beneficial.  She is maintaining the Anoro and the albuterol nebulizer  12/10/2019 Video visit  Patient is seen in follow-up and states that her cough is markedly improved however she complains of abdominal discomfort increased gas in the stomach early satiety and also constipation he states the cough medicine is useful and would like a refill she maintains the Anoro 1 puff a day she is yet to receive her Covid vaccine.  She notes no belching but has panic attacks at times if she has to get ready in the morning.  She has no real reflux symptoms at this time.  She is now living with her daughter in a new location.   03/10/2020 This is a video visit with this patient has advanced chronic obstructive lung disease. She states her shortness of breath is getting worse. He is coughing and wheezing more and having more mucus that is difficult to raise. She is on the lisinopril and states the back of her throat is irritated. She has had to increase her oxygen up to 4 L. She is on her new inhalers and is using a spacer device. I went over the proper use of the spacer device on the video with the patient. The patient is essentially homebound. She did receive her Covid vaccine still homebound vaccine program.  Shortness of Breath This is a chronic problem. The current episode started more than 1 year ago. The problem occurs daily. The problem has been rapidly worsening. Associated symptoms include chest pain, sputum  production and wheezing. Pertinent negatives include no fever or leg swelling. The symptoms are aggravated by lying flat and exercise. She has tried beta agonist inhalers, ipratropium inhalers and oral steroids for the symptoms. The treatment provided significant relief. Her past medical history is significant for COPD.    Past Medical History:  Diagnosis Date  . Arthritis   . Asthma   . Cancer ([place])   . Chronic respiratory failure with hypoxia and hypercapnia (HCC)   . COPD (chronic obstructive pulmonary disease) ([place])   . Hypertension      Family History  Problem Relation Age of Onset  . Hypertension Mother   . Heart disease Mother   . Cancer Mother   . Asthma Father   . Asthma Sister   . Hypertension Sister   . Asthma Brother   . Cancer Brother      Social History   Socioeconomic History  . Marital status: Single    Spouse name: Not on file  . Number of children: Not on file  . Years of education: Not on file  . Highest education level: Not on file  Occupational History  . Not on file  Tobacco Use  . Smoking  status: Former Research scientist (life sciences)  . Smokeless tobacco: Never Used  . Tobacco comment: quit smoking in 2018    Vaping Use  . Vaping Use: Never used  Substance and Sexual Activity  . Alcohol use: Not Currently  . Drug use: Not Currently  . Sexual activity: Not on file  Other Topics Concern  . Not on file  Social History Narrative  . Not on file   Social Determinants of Health   Financial Resource Strain:   . Difficulty of Paying Living Expenses:   Food Insecurity:   . Worried About Charity fundraiser in the Last Year:   . Arboriculturist in the Last Year:   Transportation Needs:   . Film/video editor (Medical):   Marland Kitchen Lack of Transportation (Non-Medical):   Physical Activity:   . Days of Exercise per Week:   . Minutes of Exercise per Session:   Stress:   . Feeling of Stress :   Social Connections:   . Frequency of Communication with Friends and Family:    . Frequency of Social Gatherings with Friends and Family:   . Attends Religious Services:   . Active Member of Clubs or Organizations:   . Attends Archivist Meetings:   Marland Kitchen Marital Status:   Intimate Partner Violence:   . Fear of Current or Ex-Partner:   . Emotionally Abused:   Marland Kitchen Physically Abused:   . Sexually Abused:      No Known Allergies   Outpatient Medications Prior to Visit  Medication Sig Dispense Refill  . albuterol (VENTOLIN HFA) 108 (90 Base) MCG/ACT inhaler INHALE 1 TO 2 PUFFS INTO THE LUNGS EVERY 6 HOURS AS NEEDED FOR WHEEZING OR SHORTNESS OF BREATH 6.7 g 2  . amLODipine (NORVASC) 5 MG tablet TAKE 1 TABLET(5 MG) BY MOUTH DAILY 90 tablet 1  . Blood Pressure Monitoring (BLOOD PRESSURE MONITOR AUTOMAT) DEVI Measure blood pressure and pulse daily 1 Device 0  . clonazePAM (KLONOPIN) 0.5 MG tablet Take 1 tablet (0.5 mg total) by mouth daily as needed for anxiety. 20 tablet 0  . Glycopyrrolate-Formoterol (BEVESPI AEROSPHERE) 9-4.8 MCG/ACT AERO Inhale 2 puffs into the lungs in the morning and at bedtime. 10.7 g 6  . ibuprofen (ADVIL) 600 MG tablet TAKE 1 TABLET(600 MG) BY MOUTH EVERY 6 HOURS AS NEEDED 60 tablet 0  . loratadine (CLARITIN) 10 MG tablet Take 1 tablet (10 mg total) by mouth daily. 30 tablet 3  . Misc. Devices (PULSE OXIMETER FOR FINGER) MISC 1 application by Does not apply route daily as needed. 1 each 0  . Misc. Devices MISC Nebulizer machine, Large shower chair.  Diagnosis- chronic respiratory failure 1 each 0  . promethazine-dextromethorphan (PROMETHAZINE-DM) 6.25-15 MG/5ML syrup Take 5 mLs by mouth 4 (four) times daily as needed for cough. 240 mL 0  . senna-docusate (SENOKOT-S) 8.6-50 MG tablet Take 2 tablets by mouth at bedtime. 60 tablet 1  . simethicone (GAS-X) 80 MG chewable tablet Chew 1 tablet (80 mg total) by mouth 3 (three) times daily with meals. 60 tablet 1  . Spacer/Aero-Holding Chambers (AEROCHAMBER MV) inhaler Use as instructed 1 each 0  .  Vitamin D, Ergocalciferol, (DRISDOL) 1.25 MG (50000 UNIT) CAPS capsule TAKE 1 CAPSULE BY MOUTH EVERY 7 DAYS 4 capsule 2  . lisinopril (ZESTRIL) 40 MG tablet Take 1 tablet (40 mg total) by mouth daily. 90 tablet 1  . albuterol (PROVENTIL) (2.5 MG/3ML) 0.083% nebulizer solution Take 3 mLs (2.5 mg total) by nebulization 3 (  three) times daily. 270 mL 1  . umeclidinium-vilanterol (ANORO ELLIPTA) 62.5-25 MCG/INH AEPB Inhale 1 puff into the lungs daily. (Patient not taking: Reported on 03/10/2020) 60 each 3   No facility-administered medications prior to visit.     Review of Systems  Constitutional: Positive for activity change and fatigue. Negative for fever.  HENT: Negative.   Eyes: Negative.   Respiratory: Positive for cough, sputum production, chest tightness, shortness of breath and wheezing.   Cardiovascular: Positive for chest pain. Negative for palpitations and leg swelling.  Gastrointestinal: Negative.   Endocrine: Negative.   Genitourinary: Negative.   Musculoskeletal: Negative.   Allergic/Immunologic: Negative.   Neurological: Negative.   Hematological: Negative.   Psychiatric/Behavioral: Negative.        Objective:   Physical Exam There were no vitals filed for this visit. No exam ,video visit  The pt is no distress , on oxygen on video  BMP Latest Ref Rng & Units 11/04/2018 11/03/2018 10/28/2018  Glucose 70 - 99 mg/dL 184(H) 165(H) -  BUN 8 - 23 mg/dL 19 19 -  Creatinine 0.44 - 1.00 mg/dL 0.80 0.81 -  Sodium 135 - 145 mmol/L 138 135 139  Potassium 3.5 - 5.1 mmol/L 4.2 3.9 3.3(L)  Chloride 98 - 111 mmol/L 98 99 -  CO2 22 - 32 mmol/L 33(H) 26 -  Calcium 8.9 - 10.3 mg/dL 9.1 9.0 -   CBC Latest Ref Rng & Units 11/04/2018 10/28/2018 10/28/2018  WBC 4.0 - 10.5 K/uL 13.6(H) - 7.1  Hemoglobin 12.0 - 15.0 g/dL 10.8(L) 11.9(L) 11.9(L)  Hematocrit 36 - 46 % 35.1(L) 35.0(L) 38.5  Platelets 150 - 400 K/uL 308 - 343  CXR: 3/18 IMPRESSION: No acute abnormality seen. ABG  FIO2  1.0:    Component Value Date/Time   PHART 7.320 (L) 10/28/2018 0255   PCO2ART 62.8 (H) 10/28/2018 0255   PO2ART 196.0 (H) 10/28/2018 0255   HCO3 32.3 (H) 10/28/2018 0255   TCO2 34 (H) 10/28/2018 0255   O2SAT 100.0 10/28/2018 0255  Pulmonary functions have been obtained earlier in 2019 by her local pulmonologist in [facility].  This revealed an FEV1 of 25% predicted and diffusion capacity of 20% predicted.  This places the patient in the Gold stage D COPD category with significant advanced emphysema.     Assessment & Plan:  I personally reviewed all images and lab data in the [facility] system as well as any outside material available during this office visit and agree with the  radiology impressions.   COPD GOLD D (chronic obstructive pulmonary disease) (HCC) COPD with progressive airway inflammation plan is to pulse prednisone for 5 days continue inhaled medications as prescribed  Chronic respiratory failure with hypoxia and hypercapnia (HCC) Continue oxygen at 4 L   Diagnoses and all orders for this visit:  Mixed simple and mucopurulent chronic bronchitis (HCC)  Chronic respiratory failure with hypoxia and hypercapnia (HCC)  Other orders -     predniSONE (DELTASONE) 10 MG tablet; Take 4 tablets daily for 5 days then stop -     losartan (COZAAR) 100 MG tablet; Take 1 tablet (100 mg total) by mouth daily.    Follow Up Instructions: Knows a home visit t will be established in 1 week   I discussed the assessment and treatment plan with the patient. The patient was provided an opportunity to ask questions and all were answered. The patient agreed with the plan and demonstrated an understanding of the instructions.   The patient was advised  to call back or seek an in-person evaluation if the symptoms worsen or if the condition fails to improve as anticipated.  I provided 30 minutes of non-face-to-face time during this encounter  including  median intraservice time , review of  notes, labs, imaging, medications  and explaining diagnosis and management to the patient .    Asencion Noble, MD

## 2020-03-11 ENCOUNTER — Other Ambulatory Visit: Payer: Self-pay | Admitting: Critical Care Medicine

## 2020-03-11 NOTE — Telephone Encounter (Signed)
Requested Prescriptions  Pending Prescriptions Disp Refills  . clonazePAM (KLONOPIN) 0.5 MG tablet [Pharmacy Med Name: CLONAZEPAM 0.5MG  TABLETS] 20 tablet     Sig: TAKE 1 TABLET(0.5 MG) BY MOUTH DAILY AS NEEDED FOR ANXIETY     Not Delegated - Psychiatry:  Anxiolytics/Hypnotics Failed - 03/11/2020  4:18 PM      Failed - This refill cannot be delegated      Failed - Urine Drug Screen completed in last 360 days.      Passed - Valid encounter within last 6 months    Recent Outpatient Visits          Yesterday COPD exacerbation [facility])   [place] Elsie Stain, MD   3 months ago Chronic respiratory failure with hypoxia and hypercapnia [facility])   [place] Elsie Stain, MD   5 months ago Mixed simple and mucopurulent chronic bronchitis [facility])   [place] Elsie Stain, MD   5 months ago Chronic respiratory failure with hypoxia and hypercapnia [facility])   [place] Elsie Stain, MD   6 months ago Centrilobular emphysema [facility])   [place] Elsie Stain, MD             . loratadine (CLARITIN) 10 MG tablet [Pharmacy Med Name: LORATADINE 10MG  TABLETS] 30 tablet 3    Sig: TAKE 1 TABLET(10 MG) BY MOUTH DAILY     Ear, Nose, and Throat:  Antihistamines Passed - 03/11/2020  4:18 PM      Passed - Valid encounter within last 12 months    Recent Outpatient Visits          Yesterday COPD exacerbation [facility])   [place] Elsie Stain, MD   3 months ago Chronic respiratory failure with hypoxia and hypercapnia [facility])   [place], Patrick E, MD   5 months ago Mixed simple and mucopurulent chronic bronchitis [facility])   [place] Elsie Stain, MD   5 months ago Chronic respiratory failure with hypoxia and hypercapnia [facility])    [place], Patrick E, MD   6 months ago Centrilobular emphysema [facility])   [facility] Elsie Stain, MD

## 2020-03-11 NOTE — Telephone Encounter (Signed)
Requested medication (s) are due for refill today:  Yes  Requested medication (s) are on the active medication list:  Yes  Future visit scheduled:  No  Last Refill: 12/10/19; #20; no refills  Note to clinic:  medication is not delegated  Requested Prescriptions  Pending Prescriptions Disp Refills   clonazePAM (KLONOPIN) 0.5 MG tablet [Pharmacy Med Name: CLONAZEPAM 0.5MG  TABLETS] 20 tablet     Sig: TAKE 1 TABLET(0.5 MG) BY MOUTH DAILY AS NEEDED FOR ANXIETY      Not Delegated - Psychiatry:  Anxiolytics/Hypnotics Failed - 03/11/2020  4:18 PM      Failed - This refill cannot be delegated      Failed - Urine Drug Screen completed in last 360 days.      Passed - Valid encounter within last 6 months    Recent Outpatient Visits           Yesterday COPD exacerbation [facility])   [place] Elsie Stain, MD   3 months ago Chronic respiratory failure with hypoxia and hypercapnia [facility])   [place] Elsie Stain, MD   5 months ago Mixed simple and mucopurulent chronic bronchitis [facility])   [place] Elsie Stain, MD   5 months ago Chronic respiratory failure with hypoxia and hypercapnia [facility])   [place] Elsie Stain, MD   6 months ago Centrilobular emphysema [facility])   [place] Elsie Stain, MD               Signed Prescriptions Disp Refills   loratadine (CLARITIN) 10 MG tablet 30 tablet 3    Sig: TAKE 1 TABLET(10 MG) BY MOUTH DAILY      Ear, Nose, and Throat:  Antihistamines Passed - 03/11/2020  4:18 PM      Passed - Valid encounter within last 12 months    Recent Outpatient Visits           Yesterday COPD exacerbation [facility])   [place] Elsie Stain, MD   3 months ago Chronic respiratory failure with hypoxia and hypercapnia [facility])   [employer], Patrick E, MD   5 months ago Mixed simple and mucopurulent chronic bronchitis [facility])   [place] Elsie Stain, MD   5 months ago Chronic respiratory failure with hypoxia and hypercapnia [facility])   [place], Patrick E, MD   6 months ago Centrilobular emphysema [facility])   [place] [facility] Elsie Stain, MD

## 2020-03-14 ENCOUNTER — Telehealth: Payer: Self-pay | Admitting: Critical Care Medicine

## 2020-03-14 MED ORDER — VITAMIN D (ERGOCALCIFEROL) 1.25 MG (50000 UNIT) PO CAPS: 50000.0000 [IU] | ORAL_CAPSULE | ORAL | 0 refills | Status: DC

## 2020-03-14 NOTE — Telephone Encounter (Signed)
Patient lost Vitamin D, Ergocalciferol, (DRISDOL) 1.25 MG (50000 UNIT) CAPS capsule and would like medication refill, informed patient pleas allow 48 to 72 hour turn around time  [place], [place] Phone:  [phone]  Fax:  [phone]

## 2020-03-14 NOTE — Telephone Encounter (Signed)
Requested medication (s) are due for refill today: no  Requested medication (s) are on the active medication list: yes  Last refill:  03/03/2020  Future visit scheduled:no  Notes to clinic:  patient is not sure what she may have done with her medication and needs to have it filled early   Requested Prescriptions  Pending Prescriptions Disp Refills   Vitamin D, Ergocalciferol, (DRISDOL) 1.25 MG (50000 UNIT) CAPS capsule 4 capsule 2      Endocrinology:  Vitamins - Vitamin D Supplementation Failed - 03/14/2020 10:50 AM      Failed - 50,000 IU strengths are not delegated      Failed - Ca in normal range and within 360 days    Calcium  Date Value Ref Range Status  11/04/2018 9.1 8.9 - 10.3 mg/dL Final   Calcium, Ion  Date Value Ref Range Status  10/28/2018 1.25 1.15 - 1.40 mmol/L Final          Failed - Phosphate in normal range and within 360 days    No results found for: PHOS        Failed - Vitamin D in normal range and within 360 days    No results found for: [id number], [id number], [id number], 25OHVITD3, 25OHVITD2, 25OHVITD3, 25OHVITD2, 25OHVITD1, 25OHVITD2, 25OHVITD3, VD25OH        Passed - Valid encounter within last 12 months    Recent Outpatient Visits           4 days ago COPD exacerbation [facility])   [place] Elsie Stain, MD   3 months ago Chronic respiratory failure with hypoxia and hypercapnia [facility])   [place] Elsie Stain, MD   5 months ago Mixed simple and mucopurulent chronic bronchitis [facility])   [place] Elsie Stain, MD   6 months ago Chronic respiratory failure with hypoxia and hypercapnia [facility])   [place], Patrick E, MD   6 months ago Centrilobular emphysema [facility])   [place], Patrick E, MD

## 2020-03-18 ENCOUNTER — Other Ambulatory Visit: Payer: Self-pay | Admitting: Critical Care Medicine

## 2020-03-22 ENCOUNTER — Encounter: Payer: Self-pay | Admitting: Critical Care Medicine

## 2020-03-22 ENCOUNTER — Ambulatory Visit (HOSPITAL_BASED_OUTPATIENT_CLINIC_OR_DEPARTMENT_OTHER): Payer: Medicare Other | Admitting: Critical Care Medicine

## 2020-03-22 ENCOUNTER — Telehealth: Payer: Self-pay

## 2020-03-22 DIAGNOSIS — K219 Gastro-esophageal reflux disease without esophagitis: Secondary | ICD-10-CM

## 2020-03-22 DIAGNOSIS — E559 Vitamin D deficiency, unspecified: Secondary | ICD-10-CM

## 2020-03-22 DIAGNOSIS — J418 Mixed simple and mucopurulent chronic bronchitis: Secondary | ICD-10-CM

## 2020-03-22 DIAGNOSIS — J432 Centrilobular emphysema: Secondary | ICD-10-CM

## 2020-03-22 DIAGNOSIS — R140 Abdominal distension (gaseous): Secondary | ICD-10-CM

## 2020-03-22 DIAGNOSIS — J9611 Chronic respiratory failure with hypoxia: Secondary | ICD-10-CM

## 2020-03-22 DIAGNOSIS — J9612 Chronic respiratory failure with hypercapnia: Secondary | ICD-10-CM

## 2020-03-22 MED ORDER — PANTOPRAZOLE SODIUM 40 MG PO TBEC: 40.0000 mg | DELAYED_RELEASE_TABLET | Freq: Two times a day (BID) | ORAL | 3 refills | Status: DC

## 2020-03-22 MED ORDER — POLYETHYLENE GLYCOL 3350 17 GM/SCOOP PO POWD
17.0000 g | Freq: Every day | ORAL | 1 refills | Status: DC | PRN
Start: 2020-03-22 — End: 2020-06-27

## 2020-03-22 MED ORDER — PROMETHAZINE-DM 6.25-15 MG/5ML PO SYRP: 5.0000 mL | ORAL_SOLUTION | Freq: Four times a day (QID) | ORAL | 0 refills | Status: DC | PRN

## 2020-03-22 NOTE — Assessment & Plan Note (Signed)
As per copd assessment °

## 2020-03-22 NOTE — Assessment & Plan Note (Signed)
Abdominal bloating with associated decreased bowel movements  We will discontinue Gas-X and Senokot and begin MiraLAX as needed

## 2020-03-22 NOTE — Assessment & Plan Note (Signed)
As per COPD assessment 

## 2020-03-22 NOTE — Assessment & Plan Note (Signed)
Patient to continue vitamin D weekly

## 2020-03-22 NOTE — Progress Notes (Signed)
Subjective:    Patient ID: Bianca Murray, female    DOB: 08/15/1955, 64 y.o.   MRN: [id number] This is a 64 year old female with advanced chronic obstructive lung disease.  Pulmonary functions have been obtained earlier in 2019 by her local pulmonologist in [facility]  This revealed an FEV1 of 25% predicted diffusion capacity of 20% predicted.  This places the patient in the Gold stage D COPD category with significant advanced emphysema. The patient did remove recently from her home in [place] to be closer to her daughter and is now staying in her daughter's home in this move occurred in January 2020.  Since that time the patient began having viral symptoms of an upper respiratory tract infection and was admitted to the hospital on March 17 being discharged on the 25th.  Below are excerpts from the discharge summary  Admit date: 10/28/2018 Discharge date: 11/05/2018  Discharge Diagnosis:  Principal Problem:   Acute on chronic respiratory failure with hypoxia and hypercapnia (HCC) Active Problems:   COPD with acute exacerbation (HCC)   COPD (chronic obstructive pulmonary disease) (HCC)   Chronic respiratory failure with hypoxia and hypercapnia (HCC)   Hyperglycemia  History of Present Illness:  Bianca Murray a 64 y.o.femalewith medical history significant ofO2 dependent COPD, Asthma. She has required intubation and tracheostomy in the past (though this has been reversed). She moved down to the area a few weeks ago from [place]. No known sick contacts. For the past 4-5 days she has had increased SOB, cough, wheezing. This has been gradually worsening since onset. She is on 3-4L O2 at baseline.   Hospital Course by Problem:  Acute on chronic hypoxic and hypercapnic respiratory failure Secondary to COPD.Slight improvement today.Sats 96%on 3L.Explained to patient needto keep sat between 89-92%.Patientcontinues to feel anxietyand reports worsening sob when  oxygen weaned. Improved air movement with less wheeze today -will weansolumedrol from 60mg  every 6hours to evey 8 hours -continue towean oxygen -may need  COPD exacerbation Chestx-ray with no acute abnormality. Hx of ESLD has been evaluated to lung transplant.Patient was unable to tolerate BiPAP.notes from pulmonology reveal most recent visit 01/2017. PFT at that time with showed severe obstructive ventilatory defect, air trapping and mildly reduced diffusion capacity - continuenebs with Brovana and Pulmicort  -will need OP pulm referral  Essential hypertension -resume home meds  Normocytic anemia Hemoglobin stable, outpatient follow-up recommended.  Hyperglycemia: likely related to steroids -carb mod diet  The patient is seen in follow-up today and note has had her oxygen system switch back to [place].  She now has an inogen portable oxygen concentrator.  She runs 3 L continuous.  Her saturations remained in the mid 90s on this.  She states she is still dyspneic with minimal exertion.  She was on Anoro but it was not covered by her insurance and we switched her to Spiriva she is not doing as well.  She states mucus gets stuck in her throat.  There is no chest pain.  She is sleeping well.  08/19/2019 Last seen 01/2019 Pt with more dyspnea, cough, no chest pain.  Notes more phlegm is present.  Needs a pulse ox. BP ok    On 4L oxygen.   Cannot tolerate prednisone or inhaled steroid.  Gold D Copd stage D  09/15/2019 Since the last visit the patient's cough has returned.  She is coughing up more thick mucus.  She has wheezing but no fever or chest pain.  She is having more dyspnea.  She states  the antibiotic at the last visit was beneficial.  She is maintaining the Anoro and the albuterol nebulizer  12/10/2019 Video visit  Patient is seen in follow-up and states that her cough is markedly improved however she complains of abdominal discomfort increased gas in the stomach early  satiety and also constipation he states the cough medicine is useful and would like a refill she maintains the Anoro 1 puff a day she is yet to receive her Covid vaccine.  She notes no belching but has panic attacks at times if she has to get ready in the morning.  She has no real reflux symptoms at this time.  She is now living with her daughter in a new location.   03/10/2020 This is a video visit with this patient has advanced chronic obstructive lung disease. She states her shortness of breath is getting worse. He is coughing and wheezing more and having more mucus that is difficult to raise. She is on the lisinopril and states the back of her throat is irritated. She has had to increase her oxygen up to 4 L. She is on her new inhalers and is using a spacer device. I went over the proper use of the spacer device on the video with the patient. The patient is essentially homebound. She did receive her Covid vaccine still homebound vaccine program.  03/22/2020 This visit is a housecall and that the patient is now homebound and not able to come to the office for examinations and also due to the fact that the Covid viral infection has spiked again in the county and is not safe for the patient to leave her home due to her severe lung disease   Patient states she continues to have shortness of breath and cough.  The cough is worse at night when she tries to sleep.  She has nasal congestion postnasal drainage.  She rattles in the chest.  She has taken prednisone in the past but she has adverse side effects from this and cannot tolerate this.  The patient states she is having excess gas belching burping and gas from below with abdominal distention.  She is not having bowel movements regularly.  She does maintain oxygen 4 L continuous at this time.  She is maintaining the Bevespi inhaler 2 puffs twice daily with an AeroChamber.  Patient does have a prescription for Promethazine DM but takes it rarely.  She states  Gas-X was of no benefit to her.  She states Senokot also did not help her as well.  Patient does maintain losartan 100 mg daily amlodipine 5 mg daily for hypertension.  She denies headache chest pain or other cardiovascular complaints at this time.  She does maintain vitamin D weekly for severe vitamin D deficiency  Shortness of Breath This is a chronic problem. The current episode started more than 1 year ago. The problem occurs daily. The problem has been rapidly worsening. Associated symptoms include chest pain, sputum production and wheezing. Pertinent negatives include no fever or leg swelling. The symptoms are aggravated by lying flat and exercise. She has tried beta agonist inhalers, ipratropium inhalers and oral steroids for the symptoms. The treatment provided significant relief. Her past medical history is significant for COPD.    Past Medical History:  Diagnosis Date  . Arthritis   . Asthma   . Cancer ([place])   . Chronic respiratory failure with hypoxia and hypercapnia (HCC)   . COPD (chronic obstructive pulmonary disease) ([place])   . Hypertension  Family History  Problem Relation Age of Onset  . Hypertension Mother   . Heart disease Mother   . Cancer Mother   . Asthma Father   . Asthma Sister   . Hypertension Sister   . Asthma Brother   . Cancer Brother      Social History   Socioeconomic History  . Marital status: Single    Spouse name: Not on file  . Number of children: Not on file  . Years of education: Not on file  . Highest education level: Not on file  Occupational History  . Not on file  Tobacco Use  . Smoking status: Former Research scientist (life sciences)  . Smokeless tobacco: Never Used  . Tobacco comment: quit smoking in 2018    Vaping Use  . Vaping Use: Never used  Substance and Sexual Activity  . Alcohol use: Not Currently  . Drug use: Not Currently  . Sexual activity: Not on file  Other Topics Concern  . Not on file  Social History Narrative  . Not on file    Social Determinants of Health   Financial Resource Strain:   . Difficulty of Paying Living Expenses:   Food Insecurity:   . Worried About Charity fundraiser in the Last Year:   . Arboriculturist in the Last Year:   Transportation Needs:   . Film/video editor (Medical):   Marland Kitchen Lack of Transportation (Non-Medical):   Physical Activity:   . Days of Exercise per Week:   . Minutes of Exercise per Session:   Stress:   . Feeling of Stress :   Social Connections:   . Frequency of Communication with Friends and Family:   . Frequency of Social Gatherings with Friends and Family:   . Attends Religious Services:   . Active Member of Clubs or Organizations:   . Attends Archivist Meetings:   Marland Kitchen Marital Status:   Intimate Partner Violence:   . Fear of Current or Ex-Partner:   . Emotionally Abused:   Marland Kitchen Physically Abused:   . Sexually Abused:      No Known Allergies   Outpatient Medications Prior to Visit  Medication Sig Dispense Refill  . albuterol (VENTOLIN HFA) 108 (90 Base) MCG/ACT inhaler INHALE 1 TO 2 PUFFS INTO THE LUNGS EVERY 6 HOURS AS NEEDED FOR WHEEZING OR SHORTNESS OF BREATH 6.7 g 2  . amLODipine (NORVASC) 5 MG tablet TAKE 1 TABLET(5 MG) BY MOUTH DAILY 90 tablet 1  . Blood Pressure Monitoring (BLOOD PRESSURE MONITOR AUTOMAT) DEVI Measure blood pressure and pulse daily 1 Device 0  . clonazePAM (KLONOPIN) 0.5 MG tablet TAKE 1 TABLET(0.5 MG) BY MOUTH DAILY AS NEEDED FOR ANXIETY 20 tablet 0  . Glycopyrrolate-Formoterol (BEVESPI AEROSPHERE) 9-4.8 MCG/ACT AERO Inhale 2 puffs into the lungs in the morning and at bedtime. 10.7 g 6  . ibuprofen (ADVIL) 600 MG tablet TAKE 1 TABLET(600 MG) BY MOUTH EVERY 6 HOURS AS NEEDED 60 tablet 0  . loratadine (CLARITIN) 10 MG tablet TAKE 1 TABLET(10 MG) BY MOUTH DAILY 30 tablet 3  . losartan (COZAAR) 100 MG tablet Take 1 tablet (100 mg total) by mouth daily. 60 tablet 3  . Misc. Devices (PULSE OXIMETER FOR FINGER) MISC 1 application by  Does not apply route daily as needed. 1 each 0  . Misc. Devices MISC Nebulizer machine, Large shower chair.  Diagnosis- chronic respiratory failure 1 each 0  . Spacer/Aero-Holding Chambers (AEROCHAMBER MV) inhaler Use as instructed 1 each 0  .  Vitamin D, Ergocalciferol, (DRISDOL) 1.25 MG (50000 UNIT) CAPS capsule Take 1 capsule (50,000 Units total) by mouth every 7 (seven) days. 12 capsule 0  . promethazine-dextromethorphan (PROMETHAZINE-DM) 6.25-15 MG/5ML syrup Take 5 mLs by mouth 4 (four) times daily as needed for cough. 240 mL 0  . senna-docusate (SENOKOT-S) 8.6-50 MG tablet Take 2 tablets by mouth at bedtime. 60 tablet 1  . simethicone (GAS-X) 80 MG chewable tablet Chew 1 tablet (80 mg total) by mouth 3 (three) times daily with meals. 60 tablet 1  . albuterol (PROVENTIL) (2.5 MG/3ML) 0.083% nebulizer solution Take 3 mLs (2.5 mg total) by nebulization 3 (three) times daily. 270 mL 1  . predniSONE (DELTASONE) 10 MG tablet Take 4 tablets daily for 5 days then stop (Patient not taking: Reported on 03/22/2020) 20 tablet 0   No facility-administered medications prior to visit.     Review of Systems  Constitutional: Positive for activity change and fatigue. Negative for fever.  HENT: Negative.   Eyes: Negative.   Respiratory: Positive for cough, sputum production, chest tightness, shortness of breath and wheezing.   Cardiovascular: Positive for chest pain. Negative for palpitations and leg swelling.  Gastrointestinal: Negative.   Endocrine: Negative.   Genitourinary: Negative.   Musculoskeletal: Negative.   Allergic/Immunologic: Negative.   Neurological: Negative.   Hematological: Negative.   Psychiatric/Behavioral: Negative.        Objective:   Physical Exam Saturation 98% on 4 L oxygen pulse 110 Gen: Pleasant, well-nourished, in no distress,  normal affect  ENT: No lesions,  mouth clear,  oropharynx clear, no postnasal drip  Neck: No JVD, no TMG, no carotid bruits  Lungs: No use  of accessory muscles, no dullness to percussion, distant breath sounds poor airflow   cardiovascular: Resting tachycardia, heart sounds normal, no murmur or gallops, no peripheral edema  Abdomen: Distended bowel sounds are active, no HSM, nontender abdomen Musculoskeletal: No deformities, no cyanosis or clubbing  Neuro: alert, non focal  Skin: Warm, no lesions or rashes  Note I did tour the home with the patient's daughter and found that the HVAC filter was completely clogged with dust to the point where it was not working well the home was warm on a very hot day and there was also severe dust accumulation on the vent entering into the filter system.  When I remove the filter the [facility] in the home significantly improved.  The daughter knows now how to change the filters on the HVAC and was going up to receive more filters tonight to improve the situation  BMP Latest Ref Rng & Units 11/04/2018 11/03/2018 10/28/2018  Glucose 70 - 99 mg/dL 184(H) 165(H) -  BUN 8 - 23 mg/dL 19 19 -  Creatinine 0.44 - 1.00 mg/dL 0.80 0.81 -  Sodium 135 - 145 mmol/L 138 135 139  Potassium 3.5 - 5.1 mmol/L 4.2 3.9 3.3(L)  Chloride 98 - 111 mmol/L 98 99 -  CO2 22 - 32 mmol/L 33(H) 26 -  Calcium 8.9 - 10.3 mg/dL 9.1 9.0 -   CBC Latest Ref Rng & Units 11/04/2018 10/28/2018 10/28/2018  WBC 4.0 - 10.5 K/uL 13.6(H) - 7.1  Hemoglobin 12.0 - 15.0 g/dL 10.8(L) 11.9(L) 11.9(L)  Hematocrit 36 - 46 % 35.1(L) 35.0(L) 38.5  Platelets 150 - 400 K/uL 308 - 343  CXR: 3/18 IMPRESSION: No acute abnormality seen. ABG  FIO2 1.0:    Component Value Date/Time   PHART 7.320 (L) 10/28/2018 0255   PCO2ART 62.8 (H) 10/28/2018 0255  PO2ART 196.0 (H) 10/28/2018 0255   HCO3 32.3 (H) 10/28/2018 0255   TCO2 34 (H) 10/28/2018 0255   O2SAT 100.0 10/28/2018 0255  Pulmonary functions have been obtained earlier in 2019 by her local pulmonologist in [facility].  This revealed an FEV1 of 25% predicted and diffusion capacity of 20%  predicted.  This places the patient in the Gold stage D COPD category with significant advanced emphysema.     Assessment & Plan:  I personally reviewed all images and lab data in the [facility] system as well as any outside material available during this office visit and agree with the  radiology impressions.   COPD GOLD D (chronic obstructive pulmonary disease) (HCC) COPD with centrilobular emphysema and chronic hypoxic and hypercapnic respiratory failure oxygen dependent on 4 L  Note recent pulmonary decline likely due to the fact that she does not have good air conditioning in the home and the HVAC system filter was completely clogged with severe dust which was recirculating in the home  The daughter now knows how to operate the HVAC properly I also asked her to get an HVAC service out to have the entire system checked including the dock work  We will continue the inhaler 2 puffs twice a day note the patient's technique was poor and I asked her to take a deeper inhalation with the spacer device in place  She will use a spacer device on her albuterol inhaler as well  Chronic respiratory failure with hypoxia and hypercapnia (HCC) As per COPD assessment  Abdominal bloating Abdominal bloating with associated decreased bowel movements  We will discontinue Gas-X and Senokot and begin MiraLAX as needed  Vitamin D deficiency Patient to continue vitamin D weekly  GERD (gastroesophageal reflux disease) Reflux disease playing a significant role in the patient's respiratory decompensation  Note patient will receive Protonix 40 mg twice daily  Centrilobular emphysema ([place]) As per copd assessment    Diagnoses and all orders for this visit:  Chronic respiratory failure with hypoxia and hypercapnia (HCC)  Mixed simple and mucopurulent chronic bronchitis (HCC)  Abdominal bloating  Vitamin D deficiency  Gastroesophageal reflux disease without esophagitis  Centrilobular emphysema  ([place])  Other orders -     Discontinue: promethazine-dextromethorphan (PROMETHAZINE-DM) 6.25-15 MG/5ML syrup; Take 5 mLs by mouth 4 (four) times daily as needed for cough. -     pantoprazole (PROTONIX) 40 MG tablet; Take 1 tablet (40 mg total) by mouth 2 (two) times daily before a meal. -     polyethylene glycol powder (GLYCOLAX/MIRALAX) 17 GM/SCOOP powder; Take 17 g by mouth daily as needed.   Note I spent a total of 60 minutes in the patient's home during the home and looking at all the various environments no her bedroom seem very clean and I was relieved to see that she has not a bed that can elevate the head electronically

## 2020-03-22 NOTE — Telephone Encounter (Signed)
Medical records have been faxed to [facility] as requested

## 2020-03-22 NOTE — Assessment & Plan Note (Signed)
COPD with centrilobular emphysema and chronic hypoxic and hypercapnic respiratory failure oxygen dependent on 4 L  Note recent pulmonary decline likely due to the fact that she does not have good air conditioning in the home and the HVAC system filter was completely clogged with severe dust which was recirculating in the home  The daughter now knows how to operate the HVAC properly I also asked her to get an HVAC service out to have the entire system checked including the dock work  We will continue the inhaler 2 puffs twice a day note the patient's technique was poor and I asked her to take a deeper inhalation with the spacer device in place  She will use a spacer device on her albuterol inhaler as well

## 2020-03-22 NOTE — Assessment & Plan Note (Signed)
Reflux disease playing a significant role in the patient's respiratory decompensation  Note patient will receive Protonix 40 mg twice daily

## 2020-03-28 ENCOUNTER — Other Ambulatory Visit: Payer: Self-pay | Admitting: Pharmacist

## 2020-03-28 MED ORDER — BEVESPI AEROSPHERE 9-4.8 MCG/ACT IN AERO: 2.0000 | INHALATION_SPRAY | Freq: Two times a day (BID) | RESPIRATORY_TRACT | 6 refills | Status: DC

## 2020-03-30 ENCOUNTER — Telehealth: Payer: Self-pay

## 2020-03-30 NOTE — Telephone Encounter (Signed)
CMN for O2 faxed to [employer]

## 2020-03-31 ENCOUNTER — Other Ambulatory Visit: Payer: Self-pay | Admitting: Critical Care Medicine

## 2020-03-31 DIAGNOSIS — F411 Generalized anxiety disorder: Secondary | ICD-10-CM

## 2020-03-31 NOTE — Telephone Encounter (Signed)
Requested medication (s) are due for refill today: no  Requested medication (s) are on the active medication list: yes  Last refill:  03/13/2020  Future visit scheduled: no  Notes to clinic:  this refill cannot be delegated    Requested Prescriptions  Pending Prescriptions Disp Refills   clonazePAM (KLONOPIN) 0.5 MG tablet [Pharmacy Med Name: CLONAZEPAM 0.5MG  TABLETS] 20 tablet     Sig: TAKE 1 TABLET(0.5 MG) BY MOUTH DAILY AS NEEDED FOR ANXIETY      Not Delegated - Psychiatry:  Anxiolytics/Hypnotics Failed - 03/31/2020  1:07 PM      Failed - This refill cannot be delegated      Failed - Urine Drug Screen completed in last 360 days.      Passed - Valid encounter within last 6 months    Recent Outpatient Visits           1 week ago Chronic respiratory failure with hypoxia and hypercapnia ([place])   [place] Elsie Stain, MD   3 weeks ago COPD exacerbation [facility])   [place] Elsie Stain, MD   3 months ago Chronic respiratory failure with hypoxia and hypercapnia [facility])   [place], Patrick E, MD   6 months ago Mixed simple and mucopurulent chronic bronchitis [facility])   [place] Elsie Stain, MD   6 months ago Chronic respiratory failure with hypoxia and hypercapnia [facility])   [place], Patrick E, MD

## 2020-04-07 ENCOUNTER — Other Ambulatory Visit: Payer: Self-pay | Admitting: Critical Care Medicine

## 2020-04-24 ENCOUNTER — Other Ambulatory Visit: Payer: Self-pay | Admitting: Critical Care Medicine

## 2020-04-24 MED ORDER — AZITHROMYCIN 250 MG PO TABS: ORAL_TABLET | ORAL | 0 refills | Status: DC

## 2020-04-24 NOTE — Telephone Encounter (Signed)
Requested medication (s) are due for refill today: yes  Requested medication (s) are on the active medication list: yes  Last refill:  04/07/20  Future visit scheduled: no  Notes to clinic:  overdue labs- last Hgb 10.8 g/dl on 11/04/18   Requested Prescriptions  Pending Prescriptions Disp Refills   ibuprofen (ADVIL) 600 MG tablet [Pharmacy Med Name: IBUPROFEN 600MG  TABLETS] 60 tablet 0    Sig: TAKE 1 TABLET BY MOUTH EVERY 6 HOURS AS NEEDED      Analgesics:  NSAIDS Failed - 04/24/2020  1:22 PM      Failed - Cr in normal range and within 360 days    Creatinine, Ser  Date Value Ref Range Status  11/04/2018 0.80 0.44 - 1.00 mg/dL Final          Failed - HGB in normal range and within 360 days    Hemoglobin  Date Value Ref Range Status  11/04/2018 10.8 (L) 12.0 - 15.0 g/dL Final          Passed - Patient is not pregnant      Passed - Valid encounter within last 12 months    Recent Outpatient Visits           1 month ago Chronic respiratory failure with hypoxia and hypercapnia ([place])   [place] Elsie Stain, MD   1 month ago COPD exacerbation [facility])   [place] Elsie Stain, MD   4 months ago Chronic respiratory failure with hypoxia and hypercapnia [facility])   [place] Elsie Stain, MD   7 months ago Mixed simple and mucopurulent chronic bronchitis [facility])   [place] Elsie Stain, MD   7 months ago Chronic respiratory failure with hypoxia and hypercapnia [facility])   [place], Patrick E, MD

## 2020-04-24 NOTE — Progress Notes (Signed)
Abx for bronchitis

## 2020-04-26 ENCOUNTER — Telehealth: Payer: Self-pay | Admitting: Critical Care Medicine

## 2020-04-26 MED ORDER — ALBUTEROL SULFATE HFA 108 (90 BASE) MCG/ACT IN AERS: 2.0000 | INHALATION_SPRAY | Freq: Four times a day (QID) | RESPIRATORY_TRACT | 3 refills | Status: DC | PRN

## 2020-04-26 NOTE — Telephone Encounter (Signed)
Med refill

## 2020-05-06 IMAGING — DX CHEST - 2 VIEW
2 series · 2 of 2 positions shown · non-contrast
Comparison: 10/22/2018

CLINICAL DATA: Cough and shortness of breath for 2 days

EXAM:
CHEST - 2 VIEW

[x chest ap]
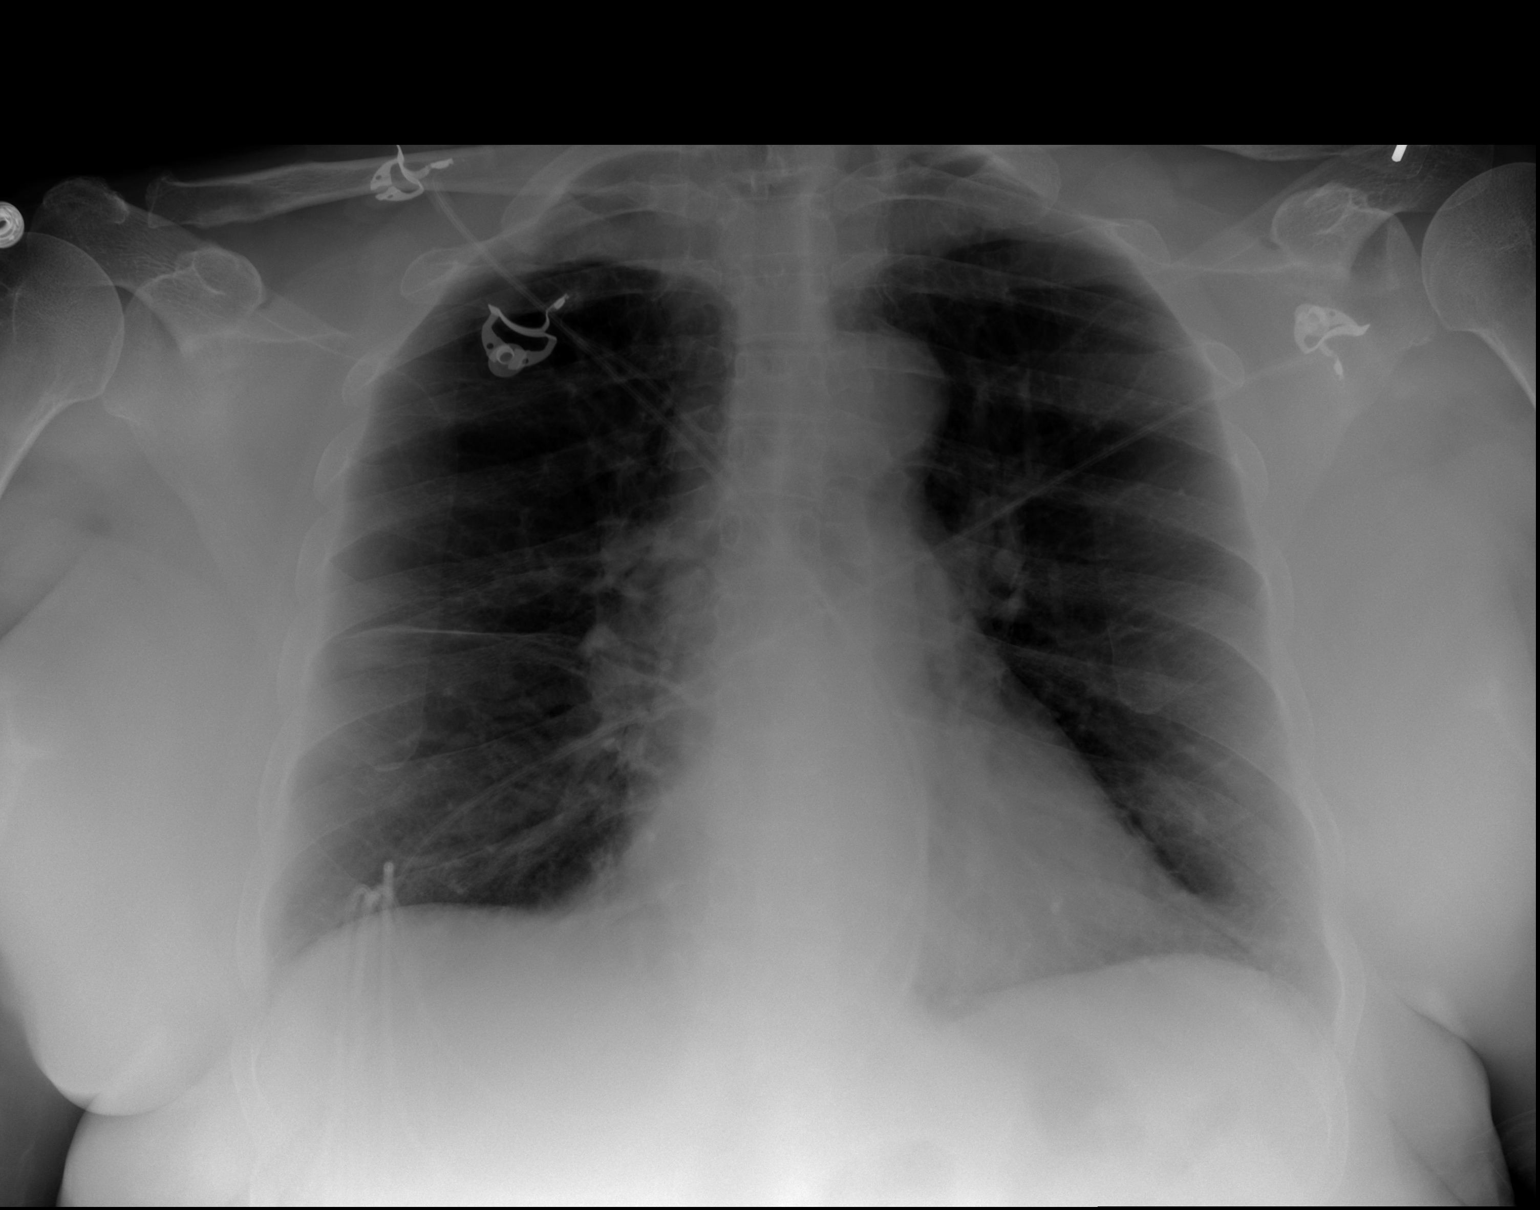

[w chest lat]
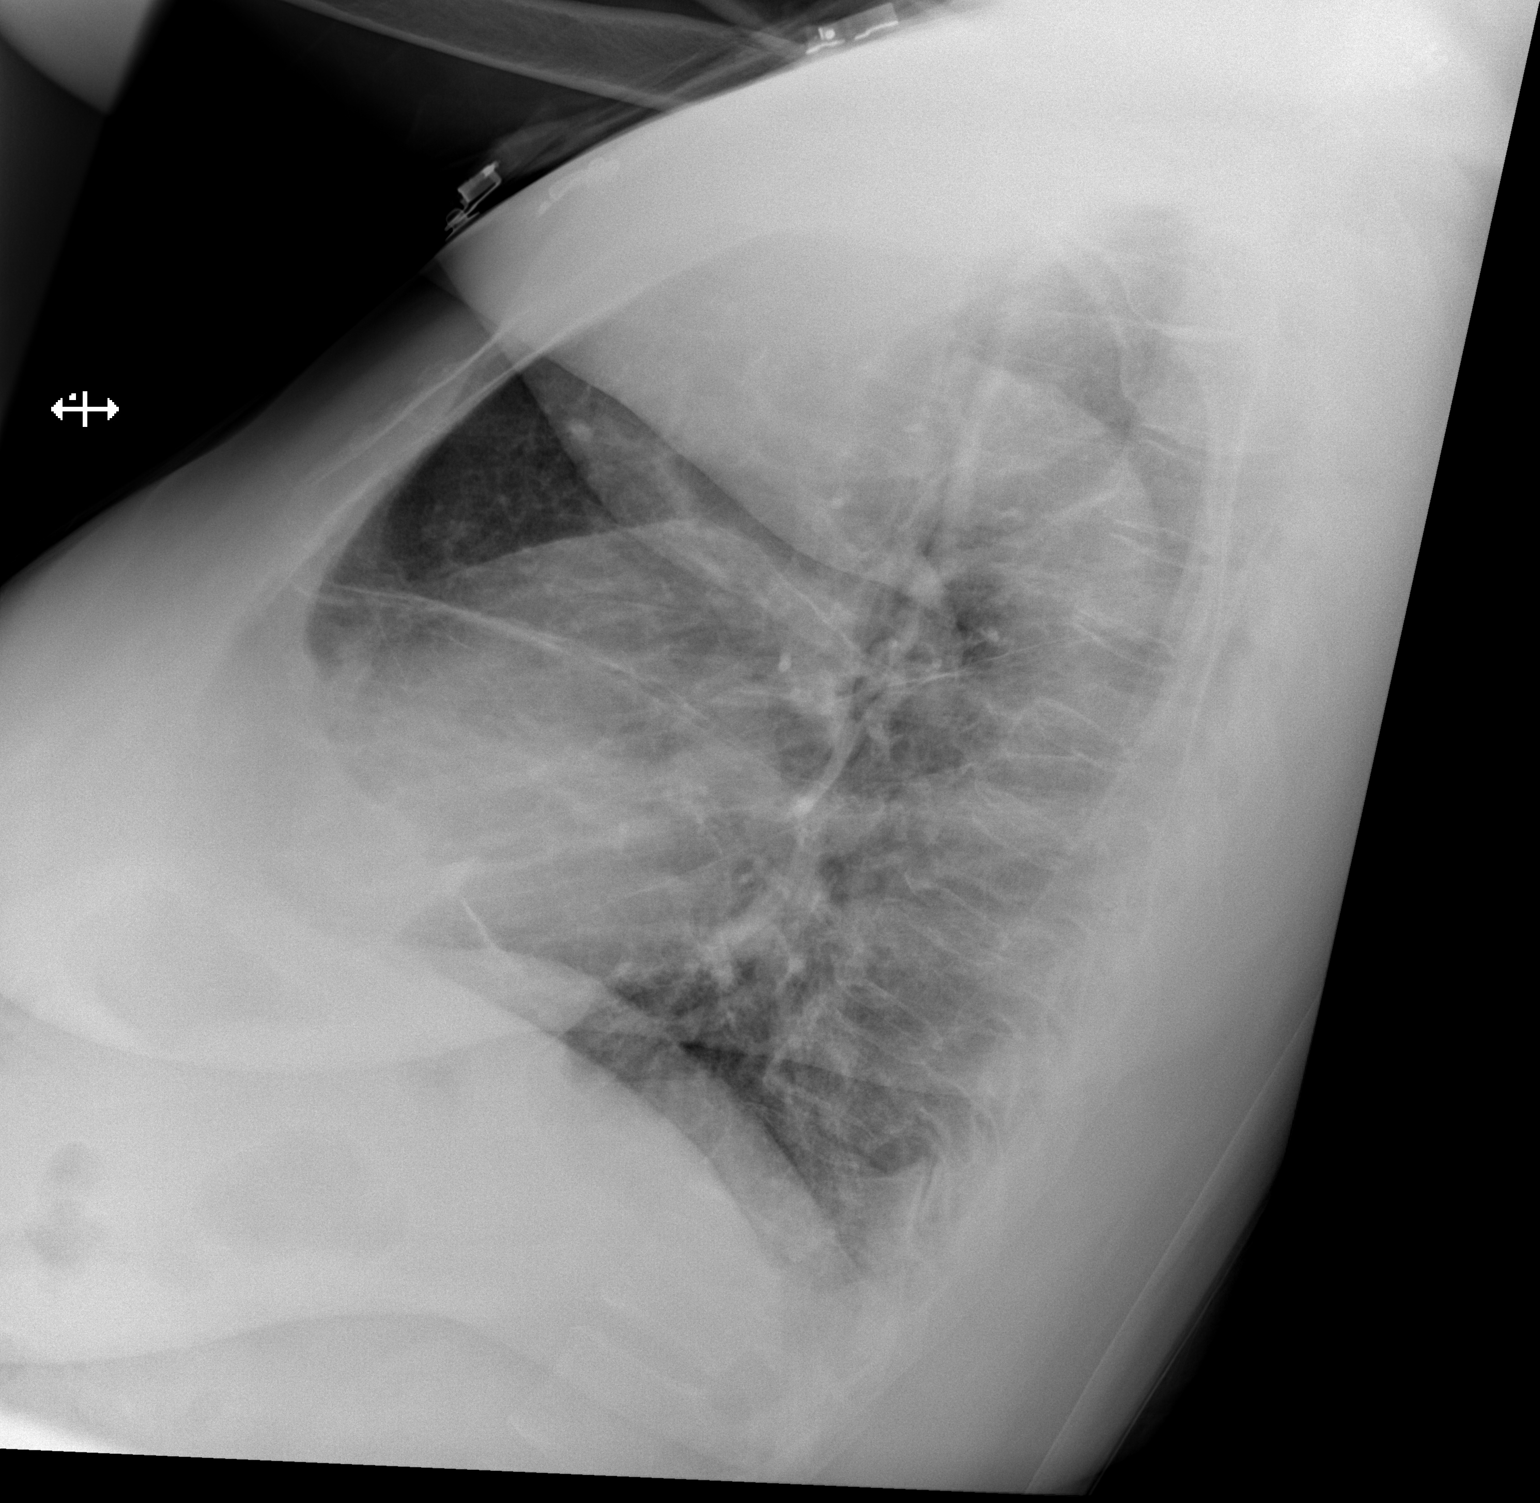

[2 of 2 positions shown; findings below may reference images not displayed]

FINDINGS: Cardiac shadow is stable when compared with the previous day. The
lungs are well aerated without focal infiltrate or sizable effusion.
No bony abnormality is noted.
IMPRESSION: No acute abnormality seen.

## 2020-05-18 ENCOUNTER — Telehealth: Payer: Self-pay | Admitting: Critical Care Medicine

## 2020-05-18 MED ORDER — GABAPENTIN 100 MG PO CAPS: 100.0000 mg | ORAL_CAPSULE | Freq: Three times a day (TID) | ORAL | 3 refills | Status: DC

## 2020-05-18 NOTE — Telephone Encounter (Signed)
Let the pt know Rx was sent to her pharmacy for gabapentin for the neuropathy

## 2020-05-18 NOTE — Telephone Encounter (Signed)
Pt states she has spoken to Dr Joya Gaskins about the neuropathy in her legs, and would like to know if Dr will prescribe something for her- the pain in her legs.   [facility] [phone] - [address], [address] [place] AT [address] Phone:  [phone]  Fax:  [phone]

## 2020-05-18 NOTE — Telephone Encounter (Signed)
Unable to reach or leave VM , will try again .

## 2020-05-20 NOTE — Telephone Encounter (Signed)
2 nd attempt unable to reach or leave VM to listed phones

## 2020-06-01 ENCOUNTER — Telehealth: Payer: Self-pay

## 2020-06-01 NOTE — Telephone Encounter (Signed)
Copied from [place] [id number]. Topic: General - Other >> May 30, 2020 12:08 PM Erick Blinks wrote: Reason for CRM: Pt has COPD, she has trouble getting up to use the restroom. She is interested in home health services.   Best contact: [phone]  May contact her via MyChart

## 2020-06-02 NOTE — Telephone Encounter (Signed)
Can you please have the home health completed for the patient and Dr. Joya Gaskins will sign when he is back in the office?  Thank you

## 2020-06-03 NOTE — Telephone Encounter (Signed)
Explained to patient that Dr. Joya Gaskins is not in the office and will reply to message upon his return. Patient verbalized understanding.

## 2020-06-03 NOTE — Telephone Encounter (Signed)
Pt requests call back from Dr. Joya Gaskins regarding her request for home health services.

## 2020-06-04 ENCOUNTER — Other Ambulatory Visit: Payer: Self-pay | Admitting: Critical Care Medicine

## 2020-06-04 NOTE — Telephone Encounter (Signed)
Requested Prescriptions  Pending Prescriptions Disp Refills  . ibuprofen (ADVIL) 600 MG tablet [Pharmacy Med Name: IBUPROFEN 600MG  TABLETS] 60 tablet 0    Sig: TAKE 1 TABLET BY MOUTH EVERY 6 HOURS AS NEEDED     Analgesics:  NSAIDS Failed - 06/04/2020  1:30 PM      Failed - Cr in normal range and within 360 days    Creatinine, Ser  Date Value Ref Range Status  11/04/2018 0.80 0.44 - 1.00 mg/dL Final         Failed - HGB in normal range and within 360 days    Hemoglobin  Date Value Ref Range Status  11/04/2018 10.8 (L) 12.0 - 15.0 g/dL Final         Passed - Patient is not pregnant      Passed - Valid encounter within last 12 months    Recent Outpatient Visits          2 months ago Chronic respiratory failure with hypoxia and hypercapnia ([place])   [place] Elsie Stain, MD   2 months ago COPD exacerbation [facility])   [place] Elsie Stain, MD   5 months ago Chronic respiratory failure with hypoxia and hypercapnia [facility])   [place], Patrick E, MD   8 months ago Mixed simple and mucopurulent chronic bronchitis [facility])   [place] Elsie Stain, MD   8 months ago Chronic respiratory failure with hypoxia and hypercapnia [facility])   [place], Patrick E, MD

## 2020-06-06 ENCOUNTER — Telehealth: Payer: Self-pay | Admitting: Critical Care Medicine

## 2020-06-06 DIAGNOSIS — J432 Centrilobular emphysema: Secondary | ICD-10-CM

## 2020-06-06 DIAGNOSIS — J9612 Chronic respiratory failure with hypercapnia: Secondary | ICD-10-CM

## 2020-06-06 DIAGNOSIS — J9611 Chronic respiratory failure with hypoxia: Secondary | ICD-10-CM

## 2020-06-06 DIAGNOSIS — J418 Mixed simple and mucopurulent chronic bronchitis: Secondary | ICD-10-CM

## 2020-06-06 NOTE — Telephone Encounter (Signed)
Spoke with the patient re home health needs  For RN and possibly respiratory therapy evaluations

## 2020-06-08 NOTE — Addendum Note (Signed)
Addended by: Asencion Noble E on: 06/08/2020 04:27 PM   Modules accepted: Orders

## 2020-06-08 NOTE — Telephone Encounter (Signed)
New order sent.

## 2020-06-09 ENCOUNTER — Telehealth: Payer: Self-pay

## 2020-06-09 NOTE — Telephone Encounter (Signed)
PCS referral faxed to [facility]

## 2020-06-14 ENCOUNTER — Ambulatory Visit: Payer: Self-pay

## 2020-06-14 ENCOUNTER — Telehealth: Payer: Self-pay

## 2020-06-14 NOTE — Telephone Encounter (Signed)
Call placed to patient and explained community paramedicine program in lieu of home health nurse. She was in agreement to having a referral placed to the program.   .

## 2020-06-14 NOTE — Telephone Encounter (Signed)
°  Reason for Disposition  General information question, no triage required and triager able to answer question  Answer Assessment - Initial Assessment Questions 1. REASON FOR CALL or QUESTION: "What is your reason for calling today?" or "How can I best help you?" or "What question do you have that I can help answer?"     Should she get covid booster shot  Protocols used: [place]

## 2020-06-14 NOTE — Telephone Encounter (Signed)
Attempted to call pt but VM not set up.

## 2020-06-14 NOTE — Telephone Encounter (Signed)
Spoke to pt regarding the covid booster. Pt stated that she is due to receive the booster tomorrow and she stated that she had not heard that the boosters were being given. Advised pt that the booster was approved and now focusing on pt's who are older and have other health problems.  Pt verbalized understanding. Pt requested Dr Joya Gaskins to be aware and make sure it was ok. Pt stated that she is ok with the booster. Pt asked NT to called her daughter and discuss the covid booster and to get her mother the flu shot in a week or two. [name] (pt's daughter) stated she wold prefer to spread out the shots as well. Daughter was in agreement for her mother to get covid booster.

## 2020-06-14 NOTE — Telephone Encounter (Signed)
Call placed to [facility] healthcare to check status of PCS referral. Spoke to Jobrick who stated that the patient has an assessment scheduled for tomorrow - 06/15/2020.

## 2020-06-15 ENCOUNTER — Ambulatory Visit: Payer: Medicare Other

## 2020-06-15 NOTE — Telephone Encounter (Signed)
Referral for community paramedicine faxed to [facility]

## 2020-06-27 ENCOUNTER — Telehealth (HOSPITAL_COMMUNITY): Payer: Self-pay

## 2020-06-27 ENCOUNTER — Telehealth: Payer: Self-pay | Admitting: Critical Care Medicine

## 2020-06-27 ENCOUNTER — Telehealth (HOSPITAL_BASED_OUTPATIENT_CLINIC_OR_DEPARTMENT_OTHER): Payer: Medicare Other | Admitting: Critical Care Medicine

## 2020-06-27 ENCOUNTER — Encounter: Payer: Self-pay | Admitting: Critical Care Medicine

## 2020-06-27 DIAGNOSIS — R140 Abdominal distension (gaseous): Secondary | ICD-10-CM

## 2020-06-27 DIAGNOSIS — K219 Gastro-esophageal reflux disease without esophagitis: Secondary | ICD-10-CM | POA: Diagnosis not present

## 2020-06-27 DIAGNOSIS — E559 Vitamin D deficiency, unspecified: Secondary | ICD-10-CM

## 2020-06-27 DIAGNOSIS — J418 Mixed simple and mucopurulent chronic bronchitis: Secondary | ICD-10-CM

## 2020-06-27 DIAGNOSIS — J9612 Chronic respiratory failure with hypercapnia: Secondary | ICD-10-CM

## 2020-06-27 DIAGNOSIS — J432 Centrilobular emphysema: Secondary | ICD-10-CM | POA: Diagnosis not present

## 2020-06-27 DIAGNOSIS — F411 Generalized anxiety disorder: Secondary | ICD-10-CM

## 2020-06-27 DIAGNOSIS — J9611 Chronic respiratory failure with hypoxia: Secondary | ICD-10-CM

## 2020-06-27 DIAGNOSIS — I10 Essential (primary) hypertension: Secondary | ICD-10-CM

## 2020-06-27 MED ORDER — LOSARTAN POTASSIUM 100 MG PO TABS: 100.0000 mg | ORAL_TABLET | Freq: Every day | ORAL | 2 refills | Status: DC

## 2020-06-27 MED ORDER — SIMETHICONE 125 MG PO CAPS: 2.0000 | ORAL_CAPSULE | Freq: Two times a day (BID) | ORAL | 0 refills | Status: DC

## 2020-06-27 MED ORDER — ALBUTEROL SULFATE (2.5 MG/3ML) 0.083% IN NEBU: 2.5000 mg | INHALATION_SOLUTION | Freq: Three times a day (TID) | RESPIRATORY_TRACT | 1 refills | Status: DC

## 2020-06-27 MED ORDER — BEVESPI AEROSPHERE 9-4.8 MCG/ACT IN AERO: 2.0000 | INHALATION_SPRAY | Freq: Two times a day (BID) | RESPIRATORY_TRACT | 6 refills | Status: DC

## 2020-06-27 MED ORDER — AMLODIPINE BESYLATE 5 MG PO TABS
ORAL_TABLET | ORAL | 1 refills | Status: DC
Start: 2020-06-27 — End: 2020-09-12

## 2020-06-27 MED ORDER — POLYETHYLENE GLYCOL 3350 17 GM/SCOOP PO POWD: 17.0000 g | Freq: Every day | ORAL | 1 refills | Status: DC | PRN

## 2020-06-27 MED ORDER — PULSE OXIMETER FOR FINGER MISC
1.0000 | Freq: Every day | 0 refills | Status: DC | PRN
Start: 2020-06-27 — End: 2023-04-18

## 2020-06-27 MED ORDER — PANTOPRAZOLE SODIUM 40 MG PO TBEC: 40.0000 mg | DELAYED_RELEASE_TABLET | Freq: Two times a day (BID) | ORAL | 3 refills | Status: DC

## 2020-06-27 MED ORDER — LORATADINE 10 MG PO TABS: ORAL_TABLET | ORAL | 3 refills | Status: DC

## 2020-06-27 MED ORDER — CLONAZEPAM 0.5 MG PO TABS: ORAL_TABLET | ORAL | 0 refills | Status: DC

## 2020-06-27 MED ORDER — IBUPROFEN 600 MG PO TABS
600.0000 mg | ORAL_TABLET | Freq: Four times a day (QID) | ORAL | 0 refills | Status: DC | PRN
Start: 2020-06-27 — End: 2020-08-15

## 2020-06-27 MED ORDER — VITAMIN D (ERGOCALCIFEROL) 1.25 MG (50000 UNIT) PO CAPS: 50000.0000 [IU] | ORAL_CAPSULE | ORAL | 1 refills | Status: DC

## 2020-06-27 NOTE — Assessment & Plan Note (Signed)
Recurrent abdominal bloating due to excess gas and aerophasia due to severe COPD  Reflux disease also playing a role  Continue reflux therapy and begin simethicone 250 mg twice daily

## 2020-06-27 NOTE — Assessment & Plan Note (Signed)
Hypertension under adequate control no change in medications for now

## 2020-06-27 NOTE — Telephone Encounter (Signed)
Attempted to call Ms. Ditmore with no answer and no ability to leave a voicemail. I will attempt again tomorrow.

## 2020-06-27 NOTE — Assessment & Plan Note (Signed)
Refill vitamin D therapy to be taken weekly

## 2020-06-27 NOTE — Assessment & Plan Note (Signed)
Chronic hypoxic and hypercapnic respiratory failure continue oxygen therapy obtain pulse oximeter in the home

## 2020-06-27 NOTE — Telephone Encounter (Signed)
I could do a 130p virtual visit ,  I also opened a 2pm house call visit with another pt that eboney is scheduling

## 2020-06-27 NOTE — Telephone Encounter (Signed)
Called patient daughter and scheduled a MyChart virtual appt this afternoon.

## 2020-06-27 NOTE — Progress Notes (Signed)
Subjective:  Virtual Visit via Video Note  I connected with@ on 06/27/20 at@ by a video enabled telemedicine application and verified that I am speaking with the correct person using two identifiers.   Consent:  I discussed the limitations, risks, security and privacy concerns of performing an evaluation and management service by video visit and the availability of in person appointments. I also discussed with the patient that there may be a patient responsible charge related to this service. The patient expressed understanding and agreed to proceed.  Location of patient: Patient at home  Location of provider: I am in my office  Persons participating in the televisit with the patient.   Patient's daughter Bianca Murray on the encounter    History of Present Illness:   Patient ID: Bianca Murray, female    DOB: 1955/10/14, 64 y.o.   MRN: [id number] 11/27/18 This is a 64 year old female with advanced chronic obstructive lung disease.  Pulmonary functions have been obtained earlier in 2019 by her local pulmonologist in [facility].  This revealed an FEV1 of 25% predicted diffusion capacity of 20% predicted.  This places the patient in the Gold stage D COPD category with significant advanced emphysema. The patient did remove recently from her home in [place] to be closer to her daughter and is now staying in her daughter's home in this move occurred in January 2020.  Since that time the patient began having viral symptoms of an upper respiratory tract infection and was admitted to the hospital on March 17 being discharged on the 25th.  Below are excerpts from the discharge summary  Admit date: 10/28/2018 Discharge date: 11/05/2018  Discharge Diagnosis:  Principal Problem:   Acute on chronic respiratory failure with hypoxia and hypercapnia (HCC) Active Problems:   COPD with acute exacerbation (HCC)   COPD (chronic obstructive pulmonary disease) (HCC)   Chronic respiratory failure with  hypoxia and hypercapnia (HCC)   Hyperglycemia  History of Present Illness:  Bianca Murray a 64 y.o.femalewith medical history significant ofO2 dependent COPD, Asthma. She has required intubation and tracheostomy in the past (though this has been reversed). She moved down to the area a few weeks ago from [place]. No known sick contacts. For the past 4-5 days she has had increased SOB, cough, wheezing. This has been gradually worsening since onset. She is on 3-4L O2 at baseline.   Hospital Course by Problem:  Acute on chronic hypoxic and hypercapnic respiratory failure Secondary to COPD.Slight improvement today.Sats 96%on 3L.Explained to patient needto keep sat between 89-92%.Patientcontinues to feel anxietyand reports worsening sob when oxygen weaned. Improved air movement with less wheeze today -will weansolumedrol from 60mg  every 6hours to evey 8 hours -continue towean oxygen -may need  COPD exacerbation Chestx-ray with no acute abnormality. Hx of ESLD has been evaluated to lung transplant.Patient was unable to tolerate BiPAP.notes from pulmonology reveal most recent visit 01/2017. PFT at that time with showed severe obstructive ventilatory defect, air trapping and mildly reduced diffusion capacity - continuenebs with Brovana and Pulmicort  -will need OP pulm referral  Essential hypertension -resume home meds  Normocytic anemia Hemoglobin stable, outpatient follow-up recommended.  Hyperglycemia: likely related to steroids -carb mod diet  The patient is seen in follow-up today and note has had her oxygen system switch back to [place].  She now has an inogen portable oxygen concentrator.  She runs 3 L continuous.  Her saturations remained in the mid 90s on this.  She states she is still dyspneic with minimal exertion.  She was on Anoro but it was not covered by her insurance and we switched her to Spiriva she is not doing as well.  She states mucus  gets stuck in her throat.  There is no chest pain.  She is sleeping well.  08/19/2019 Last seen 01/2019 Pt with more dyspnea, cough, no chest pain.  Notes more phlegm is present.  Needs a pulse ox. BP ok    On 4L oxygen.   Cannot tolerate prednisone or inhaled steroid.  Gold D Copd stage D  09/15/2019 Since the last visit the patient's cough has returned.  She is coughing up more thick mucus.  She has wheezing but no fever or chest pain.  She is having more dyspnea.  She states the antibiotic at the last visit was beneficial.  She is maintaining the Anoro and the albuterol nebulizer  12/10/2019 Video visit  Patient is seen in follow-up and states that her cough is markedly improved however she complains of abdominal discomfort increased gas in the stomach early satiety and also constipation he states the cough medicine is useful and would like a refill she maintains the Anoro 1 puff a day she is yet to receive her Covid vaccine.  She notes no belching but has panic attacks at times if she has to get ready in the morning.  She has no real reflux symptoms at this time.  She is now living with her daughter in a new location.   03/10/2020 This is a video visit with this patient has advanced chronic obstructive lung disease. She states her shortness of breath is getting worse. He is coughing and wheezing more and having more mucus that is difficult to raise. She is on the lisinopril and states the back of her throat is irritated. She has had to increase her oxygen up to 4 L. She is on her new inhalers and is using a spacer device. I went over the proper use of the spacer device on the video with the patient. The patient is essentially homebound. She did receive her Covid vaccine still homebound vaccine program.  03/22/2020 This visit is a housecall and that the patient is now homebound and not able to come to the office for examinations and also due to the fact that the Covid viral infection has spiked  again in the county and is not safe for the patient to leave her home due to her severe lung disease   Patient states she continues to have shortness of breath and cough.  The cough is worse at night when she tries to sleep.  She has nasal congestion postnasal drainage.  She rattles in the chest.  She has taken prednisone in the past but she has adverse side effects from this and cannot tolerate this.  The patient states she is having excess gas belching burping and gas from below with abdominal distention.  She is not having bowel movements regularly.  She does maintain oxygen 4 L continuous at this time.  She is maintaining the Bevespi inhaler 2 puffs twice daily with an AeroChamber.  Patient does have a prescription for Promethazine DM but takes it rarely.  She states Gas-X was of no benefit to her.  She states Senokot also did not help her as well.  Patient does maintain losartan 100 mg daily amlodipine 5 mg daily for hypertension.  She denies headache chest pain or other cardiovascular complaints at this time.  She does maintain vitamin D weekly for severe vitamin D  deficiency  06/27/2020 This patient was seen by way of a video visit today.  She has been noting increased abdominal bloating and increased wheezing cough some degree of shortness of breath She has increased rhinorrhea.  She will need a Pneumovax and also flu shot as well as Covid booster shot She has a home blood pressure monitor her blood pressure has been running 142/79 in the home  Shortness of Breath This is a chronic problem. The current episode started more than 1 year ago. The problem occurs daily. The problem has been gradually worsening. Associated symptoms include abdominal pain, headaches, leg swelling and rhinorrhea. Pertinent negatives include no chest pain, ear pain, fever, hemoptysis, leg pain, PND, sputum production, vomiting or wheezing. The symptoms are aggravated by lying flat and exercise. She has tried beta agonist  inhalers, ipratropium inhalers and oral steroids for the symptoms. The treatment provided significant relief. Her past medical history is significant for COPD.    Past Medical History:  Diagnosis Date  . Arthritis   . Asthma   . Cancer ([place])   . Chronic respiratory failure with hypoxia and hypercapnia (HCC)   . COPD (chronic obstructive pulmonary disease) ([place])   . Hypertension      Family History  Problem Relation Age of Onset  . Hypertension Mother   . Heart disease Mother   . Cancer Mother   . Asthma Father   . Asthma Sister   . Hypertension Sister   . Asthma Brother   . Cancer Brother      Social History   Socioeconomic History  . Marital status: Single    Spouse name: Not on file  . Number of children: Not on file  . Years of education: Not on file  . Highest education level: Not on file  Occupational History  . Not on file  Tobacco Use  . Smoking status: Former Research scientist (life sciences)  . Smokeless tobacco: Never Used  . Tobacco comment: quit smoking in 2018    Vaping Use  . Vaping Use: Never used  Substance and Sexual Activity  . Alcohol use: Not Currently  . Drug use: Not Currently  . Sexual activity: Not on file  Other Topics Concern  . Not on file  Social History Narrative  . Not on file   Social Determinants of Health   Financial Resource Strain:   . Difficulty of Paying Living Expenses: Not on file  Food Insecurity:   . Worried About Charity fundraiser in the Last Year: Not on file  . Ran Out of Food in the Last Year: Not on file  Transportation Needs:   . Lack of Transportation (Medical): Not on file  . Lack of Transportation (Non-Medical): Not on file  Physical Activity:   . Days of Exercise per Week: Not on file  . Minutes of Exercise per Session: Not on file  Stress:   . Feeling of Stress : Not on file  Social Connections:   . Frequency of Communication with Friends and Family: Not on file  . Frequency of Social Gatherings with Friends and Family: Not  on file  . Attends Religious Services: Not on file  . Active Member of Clubs or Organizations: Not on file  . Attends Archivist Meetings: Not on file  . Marital Status: Not on file  Intimate Partner Violence:   . Fear of Current or Ex-Partner: Not on file  . Emotionally Abused: Not on file  . Physically Abused: Not on file  .  Sexually Abused: Not on file     No Known Allergies   Outpatient Medications Prior to Visit  Medication Sig Dispense Refill  . albuterol (VENTOLIN HFA) 108 (90 Base) MCG/ACT inhaler Inhale 2 puffs into the lungs every 6 (six) hours as needed for wheezing or shortness of breath. 18 g 3  . Blood Pressure Monitoring (BLOOD PRESSURE MONITOR AUTOMAT) DEVI Measure blood pressure and pulse daily 1 Device 0  . gabapentin (NEURONTIN) 100 MG capsule Take 1 capsule (100 mg total) by mouth 3 (three) times daily. 90 capsule 3  . Misc. Devices MISC Nebulizer machine, Large shower chair.  Diagnosis- chronic respiratory failure 1 each 0  . Spacer/Aero-Holding Chambers (AEROCHAMBER MV) inhaler Use as instructed 1 each 0  . amLODipine (NORVASC) 5 MG tablet TAKE 1 TABLET(5 MG) BY MOUTH DAILY 90 tablet 1  . clonazePAM (KLONOPIN) 0.5 MG tablet TAKE 1 TABLET(0.5 MG) BY MOUTH DAILY AS NEEDED FOR ANXIETY 20 tablet 0  . Glycopyrrolate-Formoterol (BEVESPI AEROSPHERE) 9-4.8 MCG/ACT AERO Inhale 2 puffs into the lungs in the morning and at bedtime. 10.7 g 6  . ibuprofen (ADVIL) 600 MG tablet TAKE 1 TABLET BY MOUTH EVERY 6 HOURS AS NEEDED 60 tablet 0  . loratadine (CLARITIN) 10 MG tablet TAKE 1 TABLET(10 MG) BY MOUTH DAILY 30 tablet 3  . losartan (COZAAR) 100 MG tablet Take 1 tablet (100 mg total) by mouth daily. 60 tablet 3  . Misc. Devices (PULSE OXIMETER FOR FINGER) MISC 1 application by Does not apply route daily as needed. 1 each 0  . pantoprazole (PROTONIX) 40 MG tablet Take 1 tablet (40 mg total) by mouth 2 (two) times daily before a meal. 60 tablet 3  . polyethylene glycol  powder (GLYCOLAX/MIRALAX) 17 GM/SCOOP powder Take 17 g by mouth daily as needed. 507 g 1  . Vitamin D, Ergocalciferol, (DRISDOL) 1.25 MG (50000 UNIT) CAPS capsule Take 1 capsule (50,000 Units total) by mouth every 7 (seven) days. 12 capsule 0  . albuterol (PROVENTIL) (2.5 MG/3ML) 0.083% nebulizer solution Take 3 mLs (2.5 mg total) by nebulization 3 (three) times daily. 270 mL 1  . azithromycin (ZITHROMAX) 250 MG tablet Take two once then one daily until gone (Patient not taking: Reported on 06/27/2020) 6 tablet 0   No facility-administered medications prior to visit.     Review of Systems  Constitutional: Positive for activity change and fatigue. Negative for fever.  HENT: Positive for rhinorrhea. Negative for ear pain.   Eyes: Negative.   Respiratory: Positive for cough, chest tightness and shortness of breath. Negative for hemoptysis, sputum production and wheezing.   Cardiovascular: Positive for leg swelling. Negative for chest pain, palpitations and PND.  Gastrointestinal: Positive for abdominal pain. Negative for vomiting.  Endocrine: Negative.   Genitourinary: Negative.   Musculoskeletal: Negative.   Allergic/Immunologic: Negative.   Neurological: Positive for headaches.  Hematological: Negative.   Psychiatric/Behavioral: Negative.        Objective:   Physical Exam This is a video visit no exam done On the video the patient is in no distress wearing her nasal cannula oxygen   BMP Latest Ref Rng & Units 11/04/2018 11/03/2018 10/28/2018  Glucose 70 - 99 mg/dL 184(H) 165(H) -  BUN 8 - 23 mg/dL 19 19 -  Creatinine 0.44 - 1.00 mg/dL 0.80 0.81 -  Sodium 135 - 145 mmol/L 138 135 139  Potassium 3.5 - 5.1 mmol/L 4.2 3.9 3.3(L)  Chloride 98 - 111 mmol/L 98 99 -  CO2 22 - 32  mmol/L 33(H) 26 -  Calcium 8.9 - 10.3 mg/dL 9.1 9.0 -   CBC Latest Ref Rng & Units 11/04/2018 10/28/2018 10/28/2018  WBC 4.0 - 10.5 K/uL 13.6(H) - 7.1  Hemoglobin 12.0 - 15.0 g/dL 10.8(L) 11.9(L) 11.9(L)   Hematocrit 36 - 46 % 35.1(L) 35.0(L) 38.5  Platelets 150 - 400 K/uL 308 - 343  CXR: 3/18 IMPRESSION: No acute abnormality seen. ABG  FIO2 1.0:    Component Value Date/Time   PHART 7.320 (L) 10/28/2018 0255   PCO2ART 62.8 (H) 10/28/2018 0255   PO2ART 196.0 (H) 10/28/2018 0255   HCO3 32.3 (H) 10/28/2018 0255   TCO2 34 (H) 10/28/2018 0255   O2SAT 100.0 10/28/2018 0255  Pulmonary functions have been obtained earlier in 2019 by her local pulmonologist in [facility].  This revealed an FEV1 of 25% predicted and diffusion capacity of 20% predicted.  This places the patient in the Gold stage D COPD category with significant advanced emphysema.     Assessment & Plan:  I personally reviewed all images and lab data in the [facility] system as well as any outside material available during this office visit and agree with the  radiology impressions.   Hypertension Hypertension under adequate control no change in medications for now  Centrilobular emphysema (HCC) Centrilobular emphysema with chronic respiratory failure Refill on nebulized therapy and inhaled medicines  I reviewed the patient's HFA technique it appears adequate  Patient to follow-up with a home visit mid December and EMT will start calling on the patient this week  Patient to obtain a pulse oximeter  Abdominal bloating Recurrent abdominal bloating due to excess gas and aerophasia due to severe COPD  Reflux disease also playing a role  Continue reflux therapy and begin simethicone 250 mg twice daily  Vitamin D deficiency Refill vitamin D therapy to be taken weekly  Chronic respiratory failure with hypoxia and hypercapnia (HCC) Chronic hypoxic and hypercapnic respiratory failure continue oxygen therapy obtain pulse oximeter in the home   Diagnoses and all orders for this visit:  Centrilobular emphysema (HCC)  Generalized anxiety disorder -     clonazePAM (KLONOPIN) 0.5 MG tablet; TAKE 1 TABLET(0.5 MG) BY MOUTH  DAILY AS NEEDED FOR ANXIETY  Gastroesophageal reflux disease without esophagitis  Abdominal bloating  Primary hypertension  Chronic respiratory failure with hypoxia and hypercapnia (HCC)  Vitamin D deficiency  Mixed simple and mucopurulent chronic bronchitis (HCC)  Other orders -     amLODipine (NORVASC) 5 MG tablet; TAKE 1 TABLET(5 MG) BY MOUTH DAILY -     ibuprofen (ADVIL) 600 MG tablet; Take 1 tablet (600 mg total) by mouth every 6 (six) hours as needed. -     loratadine (CLARITIN) 10 MG tablet; TAKE 1 TABLET(10 MG) BY MOUTH DAILY -     Misc. Devices (PULSE OXIMETER FOR FINGER) MISC; 1 application by Does not apply route daily as needed. -     losartan (COZAAR) 100 MG tablet; Take 1 tablet (100 mg total) by mouth daily. -     polyethylene glycol powder (GLYCOLAX/MIRALAX) 17 GM/SCOOP powder; Take 17 g by mouth daily as needed. -     Glycopyrrolate-Formoterol (BEVESPI AEROSPHERE) 9-4.8 MCG/ACT AERO; Inhale 2 puffs into the lungs in the morning and at bedtime. -     albuterol (PROVENTIL) (2.5 MG/3ML) 0.083% nebulizer solution; Take 3 mLs (2.5 mg total) by nebulization 3 (three) times daily. -     pantoprazole (PROTONIX) 40 MG tablet; Take 1 tablet (40 mg total) by mouth  2 (two) times daily before a meal. -     Vitamin D, Ergocalciferol, (DRISDOL) 1.25 MG (50000 UNIT) CAPS capsule; Take 1 capsule (50,000 Units total) by mouth every 7 (seven) days. -     Simethicone 125 MG CAPS; Take 2 capsules (250 mg total) by mouth in the morning and at bedtime.    Follow Up Instructions: The patient knows EMT program will start rounding on her in the home and I will see her in the home mid December  Patient to seek her pneumonia vaccine and flu shot at [facility] and will also seek a Moderna vaccine in the community for booster I discussed the assessment and treatment plan with the patient. The patient was provided an opportunity to ask questions and all were answered. The patient agreed with the  plan and demonstrated an understanding of the instructions.   The patient was advised to call back or seek an in-person evaluation if the symptoms worsen or if the condition fails to improve as anticipated.  I provided 30 minutes of non-face-to-face time during this encounter  including  median intraservice time , review of notes, labs, imaging, medications  and explaining diagnosis and management to the patient .    Asencion Noble, MD

## 2020-06-27 NOTE — Telephone Encounter (Signed)
Spoke to Ms. Start who agreed to a visit on Weds at 1400. Call complete.

## 2020-06-27 NOTE — Telephone Encounter (Signed)
Dr.Wright I see that you responded to the patient via [place]. Would you like for me to reach out to the patient and see if she can do a virtual visit this afternoon.   Copied from [place] [id number]. Topic: General - Other >> Jun 24, 2020  3:48 PM Rainey Pines A wrote: Patient is requesting a callback from Dr. Rulon Abide in regards to her stomach issues. Please advise

## 2020-06-27 NOTE — Assessment & Plan Note (Signed)
Centrilobular emphysema with chronic respiratory failure Refill on nebulized therapy and inhaled medicines  I reviewed the patient's HFA technique it appears adequate  Patient to follow-up with a home visit mid December and EMT will start calling on the patient this week  Patient to obtain a pulse oximeter

## 2020-06-27 NOTE — Patient Instructions (Signed)
Please obtain a Flu vaccine quadravalent high risk vaccine and also a Prevnar 13 vaccine (pneumonia vaccine) at [facility]  Then in December please see if [facility] can give you a Moderna booster vaccine, if not consider calling the number below: COVID-19 Vaccine Information can be found at: ShippingScam.co.uk For questions related to vaccine distribution or appointments, please email vaccine@[place] .com or call [phone].    All of your medications were renewed and sent to your [facility] pharmacy  A pulse oximter was ordered and sent to your pharmacy at [facility] [address] to see if your insurance will cover, if not it is not very expensive

## 2020-06-30 ENCOUNTER — Other Ambulatory Visit: Payer: Self-pay

## 2020-06-30 NOTE — Progress Notes (Signed)
Paramedicine Encounter    Patient ID: Bianca Murray, female    DOB: 01-24-1956, 64 y.o.   MRN: [id number]   Patient Care Team: Bianca Stain, MD as PCP - General (Pulmonary Disease)  Patient Active Problem List   Diagnosis Date Noted  . GERD (gastroesophageal reflux disease) 03/22/2020  . Abdominal bloating 12/10/2019  . Hypertension 12/03/2018  . Centrilobular emphysema ([place]) 12/03/2018  . Vitamin D deficiency 11/27/2018  . Chronic respiratory failure with hypoxia and hypercapnia (HCC)   . COPD GOLD D (chronic obstructive pulmonary disease) ([place]) 10/28/2018    Current Outpatient Medications:  .  albuterol (PROVENTIL) (2.5 MG/3ML) 0.083% nebulizer solution, Take 3 mLs (2.5 mg total) by nebulization 3 (three) times daily., Disp: 270 mL, Rfl: 1 .  albuterol (VENTOLIN HFA) 108 (90 Base) MCG/ACT inhaler, Inhale 2 puffs into the lungs every 6 (six) hours as needed for wheezing or shortness of breath., Disp: 18 g, Rfl: 3 .  amLODipine (NORVASC) 5 MG tablet, TAKE 1 TABLET(5 MG) BY MOUTH DAILY, Disp: 90 tablet, Rfl: 1 .  Blood Pressure Monitoring (BLOOD PRESSURE MONITOR AUTOMAT) DEVI, Measure blood pressure and pulse daily, Disp: 1 Device, Rfl: 0 .  clonazePAM (KLONOPIN) 0.5 MG tablet, TAKE 1 TABLET(0.5 MG) BY MOUTH DAILY AS NEEDED FOR ANXIETY, Disp: 20 tablet, Rfl: 0 .  gabapentin (NEURONTIN) 100 MG capsule, Take 1 capsule (100 mg total) by mouth 3 (three) times daily., Disp: 90 capsule, Rfl: 3 .  Glycopyrrolate-Formoterol (BEVESPI AEROSPHERE) 9-4.8 MCG/ACT AERO, Inhale 2 puffs into the lungs in the morning and at bedtime., Disp: 10.7 g, Rfl: 6 .  ibuprofen (ADVIL) 600 MG tablet, Take 1 tablet (600 mg total) by mouth every 6 (six) hours as needed., Disp: 60 tablet, Rfl: 0 .  loratadine (CLARITIN) 10 MG tablet, TAKE 1 TABLET(10 MG) BY MOUTH DAILY, Disp: 30 tablet, Rfl: 3 .  losartan (COZAAR) 100 MG tablet, Take 1 tablet (100 mg total) by mouth daily., Disp: 90 tablet, Rfl: 2 .  Misc. Devices  (PULSE OXIMETER FOR FINGER) MISC, 1 application by Does not apply route daily as needed., Disp: 1 each, Rfl: 0 .  Misc. Devices MISC, Nebulizer machine, Large shower chair.  Diagnosis- chronic respiratory failure, Disp: 1 each, Rfl: 0 .  pantoprazole (PROTONIX) 40 MG tablet, Take 1 tablet (40 mg total) by mouth 2 (two) times daily before a meal., Disp: 60 tablet, Rfl: 3 .  polyethylene glycol powder (GLYCOLAX/MIRALAX) 17 GM/SCOOP powder, Take 17 g by mouth daily as needed., Disp: 507 g, Rfl: 1 .  Simethicone 125 MG CAPS, Take 2 capsules (250 mg total) by mouth in the morning and at bedtime., Disp: 120 capsule, Rfl: 0 .  Spacer/Aero-Holding Chambers (AEROCHAMBER MV) inhaler, Use as instructed, Disp: 1 each, Rfl: 0 .  Vitamin D, Ergocalciferol, (DRISDOL) 1.25 MG (50000 UNIT) CAPS capsule, Take 1 capsule (50,000 Units total) by mouth every 7 (seven) days., Disp: 12 capsule, Rfl: 1 No Known Allergies   Social History   Socioeconomic History  . Marital status: Single    Spouse name: Not on file  . Number of children: Not on file  . Years of education: Not on file  . Highest education level: Not on file  Occupational History  . Not on file  Tobacco Use  . Smoking status: Former Research scientist (life sciences)  . Smokeless tobacco: Never Used  . Tobacco comment: quit smoking in 2018    Vaping Use  . Vaping Use: Never used  Substance and Sexual Activity  .  Alcohol use: Not Currently  . Drug use: Not Currently  . Sexual activity: Not on file  Other Topics Concern  . Not on file  Social History Narrative  . Not on file   Social Determinants of Health   Financial Resource Strain:   . Difficulty of Paying Living Expenses: Not on file  Food Insecurity:   . Worried About Charity fundraiser in the Last Year: Not on file  . Ran Out of Food in the Last Year: Not on file  Transportation Needs:   . Lack of Transportation (Medical): Not on file  . Lack of Transportation (Non-Medical): Not on file  Physical  Activity:   . Days of Exercise per Week: Not on file  . Minutes of Exercise per Session: Not on file  Stress:   . Feeling of Stress : Not on file  Social Connections:   . Frequency of Communication with Friends and Family: Not on file  . Frequency of Social Gatherings with Friends and Family: Not on file  . Attends Religious Services: Not on file  . Active Member of Clubs or Organizations: Not on file  . Attends Archivist Meetings: Not on file  . Marital Status: Not on file  Intimate Partner Violence:   . Fear of Current or Ex-Partner: Not on file  . Emotionally Abused: Not on file  . Physically Abused: Not on file  . Sexually Abused: Not on file    Physical Exam Vitals reviewed.  Constitutional:      Appearance: She is normal weight.  HENT:     Head: Normocephalic.     Nose: Nose normal. No congestion or rhinorrhea.     Mouth/Throat:     Mouth: Mucous membranes are moist.     Pharynx: Oropharynx is clear.  Eyes:     Pupils: Pupils are equal, round, and reactive to light.  Cardiovascular:     Rate and Rhythm: Normal rate and regular rhythm.     Pulses: Normal pulses.     Heart sounds: Normal heart sounds.  Pulmonary:     Effort: Pulmonary effort is normal. No respiratory distress.     Breath sounds: Normal breath sounds. No stridor. No wheezing, rhonchi or rales.  Chest:     Chest wall: No tenderness.  Abdominal:     Palpations: Abdomen is soft.  Musculoskeletal:        General: Normal range of motion.     Cervical back: Normal range of motion.     Right lower leg: No edema.     Left lower leg: No edema.  Skin:    General: Skin is warm and dry.     Capillary Refill: Capillary refill takes less than 2 seconds.  Neurological:     General: No focal deficit present.     Mental Status: She is alert. Mental status is at baseline.  Psychiatric:        Mood and Affect: Mood normal.    Arrived for home visit for [facility] who was alert and oriented seated on  the couch in her home reporting she was feeling "okay today". Bianca Murray was explained the paramedicine program and agreed to services weekly on Wednesdays at 1430. Bianca Murray's vitals were obtained. Lung sounds clear but diminished overall. Sabrinna provided with Pulse Ox and educated on safe oxygen levels. Maris understood and was appreciative. Madgeline's medications were reviewed and she agreed to pill box for next visit. I will provide same as well as a new  scale for the patient. Jacqulynn signed all appropriate forms for program and agreed to visit in one week. Home visit complete.   I relayed information to Dr. Joya Gaskins as well as he sees patient in the home often.   Refills:  Gabapentin   NEEDS:  -SCALE -PILL BOX  -PILL BAG      Future Appointments  Date Time Provider [place]  07/25/2020  1:30 PM Bianca Stain, MD [facility] None     ACTION: Home visit completed Next visit planned for one week

## 2020-07-13 ENCOUNTER — Other Ambulatory Visit: Payer: Self-pay

## 2020-07-13 NOTE — Progress Notes (Signed)
Paramedicine Encounter    Patient ID: Bianca Murray, female    DOB: 11-May-1956, 64 y.o.   MRN: [id number]   Patient Care Team: Elsie Stain, MD as PCP - General (Pulmonary Disease)  Patient Active Problem List   Diagnosis Date Noted  . GERD (gastroesophageal reflux disease) 03/22/2020  . Abdominal bloating 12/10/2019  . Hypertension 12/03/2018  . Centrilobular emphysema ([place]) 12/03/2018  . Vitamin D deficiency 11/27/2018  . Chronic respiratory failure with hypoxia and hypercapnia (HCC)   . COPD GOLD D (chronic obstructive pulmonary disease) ([place]) 10/28/2018    Current Outpatient Medications:  .  albuterol (PROVENTIL) (2.5 MG/3ML) 0.083% nebulizer solution, Take 3 mLs (2.5 mg total) by nebulization 3 (three) times daily., Disp: 270 mL, Rfl: 1 .  albuterol (VENTOLIN HFA) 108 (90 Base) MCG/ACT inhaler, Inhale 2 puffs into the lungs every 6 (six) hours as needed for wheezing or shortness of breath., Disp: 18 g, Rfl: 3 .  amLODipine (NORVASC) 5 MG tablet, TAKE 1 TABLET(5 MG) BY MOUTH DAILY, Disp: 90 tablet, Rfl: 1 .  Blood Pressure Monitoring (BLOOD PRESSURE MONITOR AUTOMAT) DEVI, Measure blood pressure and pulse daily, Disp: 1 Device, Rfl: 0 .  clonazePAM (KLONOPIN) 0.5 MG tablet, TAKE 1 TABLET(0.5 MG) BY MOUTH DAILY AS NEEDED FOR ANXIETY, Disp: 20 tablet, Rfl: 0 .  gabapentin (NEURONTIN) 100 MG capsule, Take 1 capsule (100 mg total) by mouth 3 (three) times daily., Disp: 90 capsule, Rfl: 3 .  Glycopyrrolate-Formoterol (BEVESPI AEROSPHERE) 9-4.8 MCG/ACT AERO, Inhale 2 puffs into the lungs in the morning and at bedtime., Disp: 10.7 g, Rfl: 6 .  ibuprofen (ADVIL) 600 MG tablet, Take 1 tablet (600 mg total) by mouth every 6 (six) hours as needed., Disp: 60 tablet, Rfl: 0 .  loratadine (CLARITIN) 10 MG tablet, TAKE 1 TABLET(10 MG) BY MOUTH DAILY, Disp: 30 tablet, Rfl: 3 .  losartan (COZAAR) 100 MG tablet, Take 1 tablet (100 mg total) by mouth daily., Disp: 90 tablet, Rfl: 2 .  Misc. Devices  (PULSE OXIMETER FOR FINGER) MISC, 1 application by Does not apply route daily as needed., Disp: 1 each, Rfl: 0 .  Misc. Devices MISC, Nebulizer machine, Large shower chair.  Diagnosis- chronic respiratory failure, Disp: 1 each, Rfl: 0 .  pantoprazole (PROTONIX) 40 MG tablet, Take 1 tablet (40 mg total) by mouth 2 (two) times daily before a meal., Disp: 60 tablet, Rfl: 3 .  polyethylene glycol powder (GLYCOLAX/MIRALAX) 17 GM/SCOOP powder, Take 17 g by mouth daily as needed. (Patient not taking: Reported on 06/30/2020), Disp: 507 g, Rfl: 1 .  Simethicone 125 MG CAPS, Take 2 capsules (250 mg total) by mouth in the morning and at bedtime., Disp: 120 capsule, Rfl: 0 .  Spacer/Aero-Holding Chambers (AEROCHAMBER MV) inhaler, Use as instructed, Disp: 1 each, Rfl: 0 .  Vitamin D, Ergocalciferol, (DRISDOL) 1.25 MG (50000 UNIT) CAPS capsule, Take 1 capsule (50,000 Units total) by mouth every 7 (seven) days., Disp: 12 capsule, Rfl: 1 No Known Allergies   Social History   Socioeconomic History  . Marital status: Single    Spouse name: Not on file  . Number of children: Not on file  . Years of education: Not on file  . Highest education level: Not on file  Occupational History  . Not on file  Tobacco Use  . Smoking status: Former Research scientist (life sciences)  . Smokeless tobacco: Never Used  . Tobacco comment: quit smoking in 2018    Vaping Use  . Vaping Use: Never used  Substance and Sexual Activity  . Alcohol use: Not Currently  . Drug use: Not Currently  . Sexual activity: Not on file  Other Topics Concern  . Not on file  Social History Narrative  . Not on file   Social Determinants of Health   Financial Resource Strain:   . Difficulty of Paying Living Expenses: Not on file  Food Insecurity:   . Worried About Charity fundraiser in the Last Year: Not on file  . Ran Out of Food in the Last Year: Not on file  Transportation Needs:   . Lack of Transportation (Medical): Not on file  . Lack of Transportation  (Non-Medical): Not on file  Physical Activity:   . Days of Exercise per Week: Not on file  . Minutes of Exercise per Session: Not on file  Stress:   . Feeling of Stress : Not on file  Social Connections:   . Frequency of Communication with Friends and Family: Not on file  . Frequency of Social Gatherings with Friends and Family: Not on file  . Attends Religious Services: Not on file  . Active Member of Clubs or Organizations: Not on file  . Attends Archivist Meetings: Not on file  . Marital Status: Not on file  Intimate Partner Violence:   . Fear of Current or Ex-Partner: Not on file  . Emotionally Abused: Not on file  . Physically Abused: Not on file  . Sexually Abused: Not on file    Physical Exam Vitals reviewed.  Constitutional:      Appearance: She is normal weight.  HENT:     Head: Normocephalic.     Nose: Nose normal. No congestion or rhinorrhea.     Mouth/Throat:     Mouth: Mucous membranes are moist.     Pharynx: Oropharynx is clear.  Cardiovascular:     Rate and Rhythm: Normal rate and regular rhythm.     Pulses: Normal pulses.     Heart sounds: Normal heart sounds.  Pulmonary:     Effort: Pulmonary effort is normal.     Breath sounds: Wheezing present.  Abdominal:     General: There is distension.     Tenderness: There is abdominal tenderness.  Musculoskeletal:        General: Normal range of motion.     Cervical back: Normal range of motion.     Right lower leg: No edema.     Left lower leg: No edema.  Skin:    General: Skin is warm and dry.     Capillary Refill: Capillary refill takes less than 2 seconds.  Neurological:     General: No focal deficit present.     Mental Status: She is alert. Mental status is at baseline.  Psychiatric:        Mood and Affect: Mood normal.     Arrived for home visit for [facility] today who was alert and oriented seated on her couch with her nasal cannula in place. Bianca Murray reports she is feeling okay but has  had some increased shortness of breath today and has not had a bowel movement in 3 days. Bianca Murray has not tried OTC medications yet so I encouraged her to use her Miralax Solution and prescribed medications to assist in passing a bowel movement, she reports she will. I assessed the abdomen and it was distended and tight. Bianca Murray reports she has a hernia but has never followed with a GI specialist. I advised I would speak to  Dr. Joya Gaskins about a possible referral for same, patient was grateful for this. Vitals were obtained and are as noted. Lung sounds revealed some slight wheezing in the lower lobes. Weight was obtained today at 215lbs which her last weight was 209lbs. Bianca Murray agreed to visit in one week where we will place medications in a pill box and review medications in detail. I provided patient with a new scale today. Bianca Murray was agreeable to plan. Home visit complete.   Refills- NONE     Future Appointments  Date Time Provider [place]  07/25/2020  1:30 PM Elsie Stain, MD [facility] None     ACTION: Home visit completed Next visit planned for one week

## 2020-07-14 ENCOUNTER — Other Ambulatory Visit: Payer: Self-pay | Admitting: Critical Care Medicine

## 2020-07-14 DIAGNOSIS — R140 Abdominal distension (gaseous): Secondary | ICD-10-CM

## 2020-07-14 DIAGNOSIS — Z1211 Encounter for screening for malignant neoplasm of colon: Secondary | ICD-10-CM

## 2020-07-14 DIAGNOSIS — K5900 Constipation, unspecified: Secondary | ICD-10-CM

## 2020-07-23 ENCOUNTER — Telehealth: Payer: Self-pay | Admitting: Physician Assistant

## 2020-07-23 NOTE — Telephone Encounter (Signed)
I connected by phone with Bianca Murray and/or patient's caregiver on 07/23/2020 at 4:26 PM to discuss the potential vaccination through our Homebound vaccination initiative.   Prevaccination Checklist for COVID-19 Vaccines  1.  Are you feeling sick today? no  2.  Have you ever received a dose of a COVID-19 vaccine?  yes      If yes, which one? Moderna   How many dose of Covid-19 vaccine have your received and dates ?  two 12/31/19 and 01/28/20   Check all that apply: I live in a long-term care setting. no  I have been diagnosed with a medical condition(s). Please list: COPD (pertinent to homebound status)  I am a first responder. no  I work in a long-term care facility, correctional facility, hospital, restaurant, retail setting, school, or other setting with high exposure to the public. no  4. Do you have a health condition or are you undergoing treatment that makes you moderately or severely immunocompromised? (This would include treatment for cancer or HIV, receipt of organ transplant, immunosuppressive therapy or high-dose corticosteroids, CAR-T-cell therapy, hematopoietic cell transplant [HCT], DiGeorge syndrome or Wiskott-Aldrich syndrome)  no  5. Have you received hematopoietic cell transplant (HCT) or CAR-T-cell therapies since receiving COVID-19 vaccine? no  6.  Have you ever had an allergic reaction: (This would include a severe reaction [ e.g., anaphylaxis] that required treatment with epinephrine or EpiPen or that caused you to go to the hospital.  It would also include an allergic reaction that occurred within 4 hours that caused hives, swelling, or respiratory distress, including wheezing.) A.  A previous dose of COVID-19 vaccine. no  B.  A vaccine or injectable therapy that contains multiple components, one of which is a COVID-19 vaccine component, but it is not known which component elicited the immediate reaction. no  C.  Are you allergic to polyethylene glycol? no  D. Are you  allergic to Polysorbate, which is found in some vaccines, film coated tablets and intravenous steroids?  no   7.  Have you ever had an allergic reaction to another vaccine (other than COVID-19 vaccine) or an injectable medication? (This would include a severe reaction [ e.g., anaphylaxis] that required treatment with epinephrine or EpiPen or that caused you to go to the hospital.  It would also include an allergic reaction that occurred within 4 hours that caused hives, swelling, or respiratory distress, including wheezing.)  no   8.  Have you ever had a severe allergic reaction (e.g., anaphylaxis) to something other than a component of the COVID-19 vaccine, or any vaccine or injectable medication?  This would include food, pet, venom, environmental, or oral medication allergies.  no   Check all that apply to you:  Am a female between ages 65 and 60 years old  no  Women 36 through 65 years of age can receive any FDA-authorized or -approved COVID-19 vaccine. However, they should be informed of the rare but increased risk of thrombosis with thrombocytopenia syndrome (TTS) after receipt of the [employer] Vaccine and the availability of other FDA-authorized and -approved COVID-19 vaccines. People who had TTS after a first dose of Janssen vaccine should not receive a subsequent dose of Janssen product    Am a female between ages 37 and 17 years old  no Males 5 through 65 years of age may receive the correct formulation of Pfizer-BioNTech COVID-19 vaccine. Males 18 and older can receive any FDA-authorized or -approved vaccine. However, people receiving an mRNA COVID-19 vaccine, especially  males 58 through 65 years of age and their parents/legal representative (when relevant), should be informed of the risk of developing myocarditis (an inflammation of the heart muscle) or pericarditis (inflammation of the lining around the heart) after receipt of an mRNA vaccine. The risk of developing either myocarditis or  pericarditis after vaccination is low, and lower than the risk of myocarditis associated with SARS-CoV-2 infection in adolescents and adults. Vaccine recipients should be counseled about the need to seek care if symptoms of myocarditis or pericarditis develop after vaccination     Have a history of myocarditis or pericarditis  no Myocarditis or pericarditis after receipt of the first dose of an mRNA COVID-19 vaccine series but before administration of the second dose  Experts advise that people who develop myocarditis or pericarditis after a dose of an mRNA COVID-19 vaccine not receive a subsequent dose of any COVID-19 vaccine, until additional safety data are available.  Administration of a subsequent dose of COVID-19 vaccine before safety data are available can be considered in certain circumstances after the episode of myocarditis or pericarditis has completely resolved. Until additional data are available, some experts recommend a Bianca Murray COVID-19 vaccine be considered instead of an mRNA COVID-19 vaccine. Decisions about proceeding with a subsequent dose should include a conversation between the patient, their parent/legal representative (when relevant), and their clinical team, which may include a cardiologist.    Have been treated with monoclonal antibodies or convalescent serum to prevent or treat COVID-19  no Vaccination should be offered to people regardless of history of prior symptomatic or asymptomatic SARS-CoV-2 infection. There is no recommended minimal interval between infection and vaccination.  However, vaccination should be deferred if a patient received monoclonal antibodies or convalescent serum as treatment for COVID-19 or for post-exposure prophylaxis. This is a precautionary measure until additional information becomes available, to avoid interference of the antibody treatment with vaccine-induced immune responses.  Defer COVID-19 vaccination for 30 days when a passive antibody  product was used for post-exposure prophylaxis.  Defer COVID-19 vaccination for 90 days when a passive antibody product was used to treat COVID-19.     Diagnosed with Multisystem Inflammatory Syndrome (MIS-C or MIS-A) after a COVID-19 infection  no It is unknown if people with a history of MIS-C or MIS-A are at risk for a dysregulated immune response to COVID-19 vaccination.  People with a history of MIS-C or MIS-A may choose to be vaccinated. Considerations for vaccination may include:   Clinical recovery from MIS-C or MIS-A, including return to normal cardiac function   Personal risk of severe acute COVID-19 (e.g., age, underlying conditions)   High or substantial community transmission of SARS-CoV-2 and personal increased risk of reinfection.   Timing of any immunomodulatory therapies (general best practice guidelines for immunization can be consulted for more information [email or web address])   It has been 90 days or more since their diagnosis of MIS-C   Onset of MIS-C occurred before any COVID-19 vaccination   A conversation between the patient, their guardian(s), and their clinical team or a specialist may assist with COVID-19 vaccination decisions. Healthcare providers and health departments may also request a consultation from the [place] at [email or web address].     Have a bleeding disorder  no Take a blood thinner  no As with all vaccines, any COVID-19 vaccine product may be given to these patients, if a physician familiar with the patient's bleeding risk determines that the vaccine can be administered intramuscularly with reasonable safety.  ACIP  recommends the following technique for intramuscular vaccination in patients with bleeding disorders or taking blood thinners: a fine-gauge needle (23-gauge or smaller caliber) should be used for the  vaccination, followed by firm pressure on the site, without rubbing, for at least 2 minutes.  People who regularly take aspirin or anticoagulants as part of their routine medications do not need to stop these medications prior to receipt of any COVID-19 vaccine.    Have a history of heparin-induced thrombocytopenia (HIT)  no Although the etiology of TTS associated with the Bianca Murray COVID-19 vaccine is unclear, it appears to be similar to another rare immune-mediated syndrome, heparin-induced thrombocytopenia (HIT). People with a history of an episode of an immune-mediated syndrome characterized by thrombosis and thrombocytopenia, such as HIT, should be offered a currently FDA-approved or FDA-authorized mRNA COVID-19 vaccine if it has been ?90 days since their TTS resolved. After 90 days, patients may be vaccinated with any currently FDA-approved or FDA-authorized COVID-19 vaccine, including Janssen COVID-19 Vaccine. However, people who developed TTS after their initial Bianca Murray vaccine should not receive a Janssen booster dose.  Experts believe the following factors do not make people more susceptible to TTS after receipt of the [employer]. People with these conditions can be vaccinated with any FDA-authorized or - approved COVID-19 vaccine, including the [employer] COVID-19 Vaccine:   A prior history of venous thromboembolism   Risk factors for venous thromboembolism (e.g., inherited or acquired thrombophilia including Factor V Leiden; prothrombin gene 20210A mutation; antiphospholipid syndrome; protein C, protein S or antithrombin deficiency   A prior history of other types of thromboses not associated with thrombocytopenia   Pregnancy, post-partum status, or receipt of hormonal contraceptives (e.g., combined oral contraceptives, patch, ring)   Additional recipient education materials can be found at [email or web address] vaccines/safety/JJUpdate.html.    Am currently pregnant  or breastfeeding  no Vaccination is recommended for all people aged 7 years and older, including people that are:   Pregnant   Breastfeeding   Trying to get pregnant now or who might become pregnant in the future   Pregnant, breastfeeding, and post-partum people 88 through 65 years of age should be aware of the rare risk of TTS after receipt of the Bianca Murray COVID-19 Vaccine and the availability of other FDA-authorized or -approved COVID-19 vaccines (i.e., mRNA vaccines).    Have received dermal fillers  no FDA-authorized or -approved COVID-19 vaccines can be administered to people who have received injectable dermal fillers who have no contraindications for vaccination.  Infrequently, these people might experience temporary swelling at or near the site of filler injection (usually the face or lips) following administration of a dose of an mRNA COVID-19 vaccine. These people should be advised to contact their healthcare provider if swelling develops at or near the site of dermal filler following vaccination.     Have a history of Guillain-Barr Syndrome (GBS)  no People with a history of GBS can receive any FDA-authorized or -approved COVID-19 vaccine. However, given the possible association between the [employer] and an increased risk of GBS, a patient with a history of GBS and their clinical team should discuss the availability of mRNA vaccines to offer protection against COVID-19. The highest risk has been observed in men aged 16-64 years with symptoms of GBS beginning within 42 days after Bianca Murray COVID-19 vaccination.  People who had GBS after receiving Janssen vaccine should be made aware of the option to receive an mRNA COVID-19 vaccine booster at least 2 months (8 weeks) after the  Janssen dose. However, Bianca Murray vaccine may be used as a booster, particularly if GBS occurred more than 42 days after vaccination or was related to a non-vaccine factor. Prior to booster vaccination, a  conversation between the patient and their clinical team may assist with decisions about use of a COVID-19 booster dose, including the timing of administration     Postvaccination Observation Times for People without Contraindications to Covid 19 Vaccination.  30 minutes:  People with a history of: A contraindication to another type of COVID-19 vaccine product (i.e., mRNA or viral vector COVID-19 vaccines)   Immediate (within 4 hours of exposure) non-severe allergic reaction to a COVID-19 vaccine or injectable therapies   Anaphylaxis due to any cause   Immediate allergic reaction of any severity to a non-COVID-19 vaccine   15 minutes  All other people  This patient is a 64 y.o. female that meets the FDA criteria to receive homebound vaccination. Patient or parent/caregiver understands they have the option to accept or refuse homebound vaccination.  Patient passed the pre-screening checklist and would like to proceed with homebound vaccination.  Based on questionnaire above, I recommend the patient be observed for 15 minutes.  There are an estimated 0 other household members/caregivers who are also interested in receiving the vaccine.    The patient has been confirmed homebound and eligible for homebound vaccination with the considerations outlined above. I will send the patient's information to our scheduling team who will reach out to schedule the patient and potential caregiver/family members for homebound vaccination.   She will receive the Moderna booster.    Angelena Form 07/23/2020 4:26 PM

## 2020-07-24 NOTE — Progress Notes (Deleted)
Subjective:  Virtual Visit via Video Note  I connected with@ on 07/24/20 at@ by a video enabled telemedicine application and verified that I am speaking with the correct person using two identifiers.   Consent:  I discussed the limitations, risks, security and privacy concerns of performing an evaluation and management service by video visit and the availability of in person appointments. I also discussed with the patient that there may be a patient responsible charge related to this service. The patient expressed understanding and agreed to proceed.  Location of patient: Patient at home  Location of provider: I am in my office  Persons participating in the televisit with the patient.   Patient's daughter Bianca Murray on the encounter    History of Present Illness:   Patient ID: Bianca Murray, female    DOB: 07-24-1956, 64 y.o.   MRN: [id number] 11/27/18 This is a 64 year old female with advanced chronic obstructive lung disease.  Pulmonary functions have been obtained earlier in 2019 by her local pulmonologist in [facility].  This revealed an FEV1 of 25% predicted diffusion capacity of 20% predicted.  This places the patient in the Gold stage D COPD category with significant advanced emphysema. The patient did remove recently from her home in [place] to be closer to her daughter and is now staying in her daughter's home in this move occurred in January 2020.  Since that time the patient began having viral symptoms of an upper respiratory tract infection and was admitted to the hospital on March 17 being discharged on the 25th.  Below are excerpts from the discharge summary  Admit date: 10/28/2018 Discharge date: 11/05/2018  Discharge Diagnosis:  Principal Problem:   Acute on chronic respiratory failure with hypoxia and hypercapnia (HCC) Active Problems:   COPD with acute exacerbation (HCC)   COPD (chronic obstructive pulmonary disease) (HCC)   Chronic respiratory failure with  hypoxia and hypercapnia (HCC)   Hyperglycemia  History of Present Illness:  Bianca Murray a 64 y.o.femalewith medical history significant ofO2 dependent COPD, Asthma. She has required intubation and tracheostomy in the past (though this has been reversed). She moved down to the area a few weeks ago from [place]. No known sick contacts. For the past 4-5 days she has had increased SOB, cough, wheezing. This has been gradually worsening since onset. She is on 3-4L O2 at baseline.   Hospital Course by Problem:  Acute on chronic hypoxic and hypercapnic respiratory failure Secondary to COPD.Slight improvement today.Sats 96%on 3L.Explained to patient needto keep sat between 89-92%.Patientcontinues to feel anxietyand reports worsening sob when oxygen weaned. Improved air movement with less wheeze today -will weansolumedrol from 60mg  every 6hours to evey 8 hours -continue towean oxygen -may need  COPD exacerbation Chestx-ray with no acute abnormality. Hx of ESLD has been evaluated to lung transplant.Patient was unable to tolerate BiPAP.notes from pulmonology reveal most recent visit 01/2017. PFT at that time with showed severe obstructive ventilatory defect, air trapping and mildly reduced diffusion capacity - continuenebs with Brovana and Pulmicort  -will need OP pulm referral  Essential hypertension -resume home meds  Normocytic anemia Hemoglobin stable, outpatient follow-up recommended.  Hyperglycemia: likely related to steroids -carb mod diet  The patient is seen in follow-up today and note has had her oxygen system switch back to [place].  She now has an inogen portable oxygen concentrator.  She runs 3 L continuous.  Her saturations remained in the mid 90s on this.  She states she is still dyspneic with minimal exertion.  She was on Anoro but it was not covered by her insurance and we switched her to Spiriva she is not doing as well.  She states mucus  gets stuck in her throat.  There is no chest pain.  She is sleeping well.  08/19/2019 Last seen 01/2019 Pt with more dyspnea, cough, no chest pain.  Notes more phlegm is present.  Needs a pulse ox. BP ok    On 4L oxygen.   Cannot tolerate prednisone or inhaled steroid.  Gold D Copd stage D  09/15/2019 Since the last visit the patient's cough has returned.  She is coughing up more thick mucus.  She has wheezing but no fever or chest pain.  She is having more dyspnea.  She states the antibiotic at the last visit was beneficial.  She is maintaining the Anoro and the albuterol nebulizer  12/10/2019 Video visit  Patient is seen in follow-up and states that her cough is markedly improved however she complains of abdominal discomfort increased gas in the stomach early satiety and also constipation he states the cough medicine is useful and would like a refill she maintains the Anoro 1 puff a day she is yet to receive her Covid vaccine.  She notes no belching but has panic attacks at times if she has to get ready in the morning.  She has no real reflux symptoms at this time.  She is now living with her daughter in a new location.   03/10/2020 This is a video visit with this patient has advanced chronic obstructive lung disease. She states her shortness of breath is getting worse. He is coughing and wheezing more and having more mucus that is difficult to raise. She is on the lisinopril and states the back of her throat is irritated. She has had to increase her oxygen up to 4 L. She is on her new inhalers and is using a spacer device. I went over the proper use of the spacer device on the video with the patient. The patient is essentially homebound. She did receive her Covid vaccine still homebound vaccine program.  03/22/2020 This visit is a housecall and that the patient is now homebound and not able to come to the office for examinations and also due to the fact that the Covid viral infection has spiked  again in the county and is not safe for the patient to leave her home due to her severe lung disease   Patient states she continues to have shortness of breath and cough.  The cough is worse at night when she tries to sleep.  She has nasal congestion postnasal drainage.  She rattles in the chest.  She has taken prednisone in the past but she has adverse side effects from this and cannot tolerate this.  The patient states she is having excess gas belching burping and gas from below with abdominal distention.  She is not having bowel movements regularly.  She does maintain oxygen 4 L continuous at this time.  She is maintaining the Bevespi inhaler 2 puffs twice daily with an AeroChamber.  Patient does have a prescription for Promethazine DM but takes it rarely.  She states Gas-X was of no benefit to her.  She states Senokot also did not help her as well.  Patient does maintain losartan 100 mg daily amlodipine 5 mg daily for hypertension.  She denies headache chest pain or other cardiovascular complaints at this time.  She does maintain vitamin D weekly for severe vitamin D  deficiency  06/27/2020 This patient was seen by way of a video visit today.  She has been noting increased abdominal bloating and increased wheezing cough some degree of shortness of breath She has increased rhinorrhea.  She will need a Pneumovax and also flu shot as well as Covid booster shot She has a home blood pressure monitor her blood pressure has been running 142/79 in the home  Shortness of Breath This is a chronic problem. The current episode started more than 1 year ago. The problem occurs daily. The problem has been gradually worsening. Associated symptoms include abdominal pain, headaches, leg swelling and rhinorrhea. Pertinent negatives include no chest pain, ear pain, fever, hemoptysis, leg pain, PND, sputum production, vomiting or wheezing. The symptoms are aggravated by lying flat and exercise. She has tried beta agonist  inhalers, ipratropium inhalers and oral steroids for the symptoms. The treatment provided significant relief. Her past medical history is significant for COPD.    Past Medical History:  Diagnosis Date  . Arthritis   . Asthma   . Cancer ([place])   . Chronic respiratory failure with hypoxia and hypercapnia (HCC)   . COPD (chronic obstructive pulmonary disease) ([place])   . Hypertension      Family History  Problem Relation Age of Onset  . Hypertension Mother   . Heart disease Mother   . Cancer Mother   . Asthma Father   . Asthma Sister   . Hypertension Sister   . Asthma Brother   . Cancer Brother      Social History   Socioeconomic History  . Marital status: Single    Spouse name: Not on file  . Number of children: Not on file  . Years of education: Not on file  . Highest education level: Not on file  Occupational History  . Not on file  Tobacco Use  . Smoking status: Former Research scientist (life sciences)  . Smokeless tobacco: Never Used  . Tobacco comment: quit smoking in 2018    Vaping Use  . Vaping Use: Never used  Substance and Sexual Activity  . Alcohol use: Not Currently  . Drug use: Not Currently  . Sexual activity: Not on file  Other Topics Concern  . Not on file  Social History Narrative  . Not on file   Social Determinants of Health   Financial Resource Strain: Not on file  Food Insecurity: Not on file  Transportation Needs: Not on file  Physical Activity: Not on file  Stress: Not on file  Social Connections: Not on file  Intimate Partner Violence: Not on file     No Known Allergies   Outpatient Medications Prior to Visit  Medication Sig Dispense Refill  . albuterol (PROVENTIL) (2.5 MG/3ML) 0.083% nebulizer solution Take 3 mLs (2.5 mg total) by nebulization 3 (three) times daily. 270 mL 1  . albuterol (VENTOLIN HFA) 108 (90 Base) MCG/ACT inhaler Inhale 2 puffs into the lungs every 6 (six) hours as needed for wheezing or shortness of breath. 18 g 3  . amLODipine (NORVASC)  5 MG tablet TAKE 1 TABLET(5 MG) BY MOUTH DAILY 90 tablet 1  . Blood Pressure Monitoring (BLOOD PRESSURE MONITOR AUTOMAT) DEVI Measure blood pressure and pulse daily 1 Device 0  . clonazePAM (KLONOPIN) 0.5 MG tablet TAKE 1 TABLET(0.5 MG) BY MOUTH DAILY AS NEEDED FOR ANXIETY 20 tablet 0  . gabapentin (NEURONTIN) 100 MG capsule Take 1 capsule (100 mg total) by mouth 3 (three) times daily. 90 capsule 3  . Glycopyrrolate-Formoterol (BEVESPI  AEROSPHERE) 9-4.8 MCG/ACT AERO Inhale 2 puffs into the lungs in the morning and at bedtime. 10.7 g 6  . ibuprofen (ADVIL) 600 MG tablet Take 1 tablet (600 mg total) by mouth every 6 (six) hours as needed. 60 tablet 0  . loratadine (CLARITIN) 10 MG tablet TAKE 1 TABLET(10 MG) BY MOUTH DAILY 30 tablet 3  . losartan (COZAAR) 100 MG tablet Take 1 tablet (100 mg total) by mouth daily. 90 tablet 2  . Misc. Devices (PULSE OXIMETER FOR FINGER) MISC 1 application by Does not apply route daily as needed. 1 each 0  . Misc. Devices MISC Nebulizer machine, Large shower chair.  Diagnosis- chronic respiratory failure 1 each 0  . pantoprazole (PROTONIX) 40 MG tablet Take 1 tablet (40 mg total) by mouth 2 (two) times daily before a meal. 60 tablet 3  . polyethylene glycol powder (GLYCOLAX/MIRALAX) 17 GM/SCOOP powder Take 17 g by mouth daily as needed. 507 g 1  . Simethicone 125 MG CAPS Take 2 capsules (250 mg total) by mouth in the morning and at bedtime. 120 capsule 0  . Spacer/Aero-Holding Chambers (AEROCHAMBER MV) inhaler Use as instructed 1 each 0  . Vitamin D, Ergocalciferol, (DRISDOL) 1.25 MG (50000 UNIT) CAPS capsule Take 1 capsule (50,000 Units total) by mouth every 7 (seven) days. 12 capsule 1   No facility-administered medications prior to visit.     Review of Systems  Constitutional: Positive for activity change and fatigue. Negative for fever.  HENT: Positive for rhinorrhea. Negative for ear pain.   Eyes: Negative.   Respiratory: Positive for cough, chest tightness  and shortness of breath. Negative for hemoptysis, sputum production and wheezing.   Cardiovascular: Positive for leg swelling. Negative for chest pain, palpitations and PND.  Gastrointestinal: Positive for abdominal pain. Negative for vomiting.  Endocrine: Negative.   Genitourinary: Negative.   Musculoskeletal: Negative.   Allergic/Immunologic: Negative.   Neurological: Positive for headaches.  Hematological: Negative.   Psychiatric/Behavioral: Negative.        Objective:   Physical Exam This is a video visit no exam done On the video the patient is in no distress wearing her nasal cannula oxygen   BMP Latest Ref Rng & Units 11/04/2018 11/03/2018 10/28/2018  Glucose 70 - 99 mg/dL 184(H) 165(H) -  BUN 8 - 23 mg/dL 19 19 -  Creatinine 0.44 - 1.00 mg/dL 0.80 0.81 -  Sodium 135 - 145 mmol/L 138 135 139  Potassium 3.5 - 5.1 mmol/L 4.2 3.9 3.3(L)  Chloride 98 - 111 mmol/L 98 99 -  CO2 22 - 32 mmol/L 33(H) 26 -  Calcium 8.9 - 10.3 mg/dL 9.1 9.0 -   CBC Latest Ref Rng & Units 11/04/2018 10/28/2018 10/28/2018  WBC 4.0 - 10.5 K/uL 13.6(H) - 7.1  Hemoglobin 12.0 - 15.0 g/dL 10.8(L) 11.9(L) 11.9(L)  Hematocrit 36.0 - 46.0 % 35.1(L) 35.0(L) 38.5  Platelets 150 - 400 K/uL 308 - 343  CXR: 3/18 IMPRESSION: No acute abnormality seen. ABG  FIO2 1.0:    Component Value Date/Time   PHART 7.320 (L) 10/28/2018 0255   PCO2ART 62.8 (H) 10/28/2018 0255   PO2ART 196.0 (H) 10/28/2018 0255   HCO3 32.3 (H) 10/28/2018 0255   TCO2 34 (H) 10/28/2018 0255   O2SAT 100.0 10/28/2018 0255  Pulmonary functions have been obtained earlier in 2019 by her local pulmonologist in [facility].  This revealed an FEV1 of 25% predicted and diffusion capacity of 20% predicted.  This places the patient in the Gold stage D  COPD category with significant advanced emphysema.     Assessment & Plan:  I personally reviewed all images and lab data in the [facility] system as well as any outside material available during this  office visit and agree with the  radiology impressions.   No problem-specific Assessment & Plan notes found for this encounter.   There are no diagnoses linked to this encounter.  Follow Up Instructions: The patient knows EMT program will start rounding on her in the home and I will see her in the home mid December  Patient to seek her pneumonia vaccine and flu shot at [facility] and will also seek a Moderna vaccine in the community for booster I discussed the assessment and treatment plan with the patient. The patient was provided an opportunity to ask questions and all were answered. The patient agreed with the plan and demonstrated an understanding of the instructions.   The patient was advised to call back or seek an in-person evaluation if the symptoms worsen or if the condition fails to improve as anticipated.  I provided 30 minutes of non-face-to-face time during this encounter  including  median intraservice time , review of notes, labs, imaging, medications  and explaining diagnosis and management to the patient .    Asencion Noble, MD

## 2020-07-25 ENCOUNTER — Ambulatory Visit: Payer: Medicare Other | Admitting: Critical Care Medicine

## 2020-07-26 ENCOUNTER — Ambulatory Visit: Payer: Medicare Other | Attending: Critical Care Medicine

## 2020-07-26 DIAGNOSIS — Z23 Encounter for immunization: Secondary | ICD-10-CM

## 2020-07-26 NOTE — Progress Notes (Signed)
   Covid-19 Vaccination Clinic  Name:  Bianca Murray    MRN: [id number] DOB: March 06, 1956  07/26/2020  [name] was observed post Covid-19 immunization for 15 min without incident. She was provided with Vaccine Information Sheet and instruction to access the V-Safe system.   [name] was instructed to call 911 with any severe reactions post vaccine: Marland Kitchen Difficulty breathing  . Swelling of face and throat  . A fast heartbeat  . A bad rash all over body  . Dizziness and weakness   Immunizations Administered    No immunizations on file.

## 2020-07-27 ENCOUNTER — Other Ambulatory Visit: Payer: Self-pay

## 2020-07-27 NOTE — Progress Notes (Signed)
Paramedicine Encounter    Patient ID: Bianca Murray, female    DOB: 1956/04/21, 64 y.o.   MRN: [id number]   Patient Care Team: Elsie Stain, MD as PCP - General (Pulmonary Disease)  Patient Active Problem List   Diagnosis Date Noted  . GERD (gastroesophageal reflux disease) 03/22/2020  . Abdominal bloating 12/10/2019  . Hypertension 12/03/2018  . Centrilobular emphysema ([place]) 12/03/2018  . Vitamin D deficiency 11/27/2018  . Chronic respiratory failure with hypoxia and hypercapnia (HCC)   . COPD GOLD D (chronic obstructive pulmonary disease) ([place]) 10/28/2018    Current Outpatient Medications:  .  albuterol (PROVENTIL) (2.5 MG/3ML) 0.083% nebulizer solution, Take 3 mLs (2.5 mg total) by nebulization 3 (three) times daily., Disp: 270 mL, Rfl: 1 .  albuterol (VENTOLIN HFA) 108 (90 Base) MCG/ACT inhaler, Inhale 2 puffs into the lungs every 6 (six) hours as needed for wheezing or shortness of breath., Disp: 18 g, Rfl: 3 .  amLODipine (NORVASC) 5 MG tablet, TAKE 1 TABLET(5 MG) BY MOUTH DAILY, Disp: 90 tablet, Rfl: 1 .  Blood Pressure Monitoring (BLOOD PRESSURE MONITOR AUTOMAT) DEVI, Measure blood pressure and pulse daily, Disp: 1 Device, Rfl: 0 .  clonazePAM (KLONOPIN) 0.5 MG tablet, TAKE 1 TABLET(0.5 MG) BY MOUTH DAILY AS NEEDED FOR ANXIETY, Disp: 20 tablet, Rfl: 0 .  gabapentin (NEURONTIN) 100 MG capsule, Take 1 capsule (100 mg total) by mouth 3 (three) times daily., Disp: 90 capsule, Rfl: 3 .  Glycopyrrolate-Formoterol (BEVESPI AEROSPHERE) 9-4.8 MCG/ACT AERO, Inhale 2 puffs into the lungs in the morning and at bedtime., Disp: 10.7 g, Rfl: 6 .  ibuprofen (ADVIL) 600 MG tablet, Take 1 tablet (600 mg total) by mouth every 6 (six) hours as needed., Disp: 60 tablet, Rfl: 0 .  loratadine (CLARITIN) 10 MG tablet, TAKE 1 TABLET(10 MG) BY MOUTH DAILY, Disp: 30 tablet, Rfl: 3 .  losartan (COZAAR) 100 MG tablet, Take 1 tablet (100 mg total) by mouth daily., Disp: 90 tablet, Rfl: 2 .  Misc. Devices  (PULSE OXIMETER FOR FINGER) MISC, 1 application by Does not apply route daily as needed., Disp: 1 each, Rfl: 0 .  Misc. Devices MISC, Nebulizer machine, Large shower chair.  Diagnosis- chronic respiratory failure, Disp: 1 each, Rfl: 0 .  pantoprazole (PROTONIX) 40 MG tablet, Take 1 tablet (40 mg total) by mouth 2 (two) times daily before a meal., Disp: 60 tablet, Rfl: 3 .  polyethylene glycol powder (GLYCOLAX/MIRALAX) 17 GM/SCOOP powder, Take 17 g by mouth daily as needed., Disp: 507 g, Rfl: 1 .  Simethicone 125 MG CAPS, Take 2 capsules (250 mg total) by mouth in the morning and at bedtime., Disp: 120 capsule, Rfl: 0 .  Spacer/Aero-Holding Chambers (AEROCHAMBER MV) inhaler, Use as instructed, Disp: 1 each, Rfl: 0 .  Vitamin D, Ergocalciferol, (DRISDOL) 1.25 MG (50000 UNIT) CAPS capsule, Take 1 capsule (50,000 Units total) by mouth every 7 (seven) days., Disp: 12 capsule, Rfl: 1 No Known Allergies   Social History   Socioeconomic History  . Marital status: Single    Spouse name: Not on file  . Number of children: Not on file  . Years of education: Not on file  . Highest education level: Not on file  Occupational History  . Not on file  Tobacco Use  . Smoking status: Former Research scientist (life sciences)  . Smokeless tobacco: Never Used  . Tobacco comment: quit smoking in 2018    Vaping Use  . Vaping Use: Never used  Substance and Sexual Activity  .  Alcohol use: Not Currently  . Drug use: Not Currently  . Sexual activity: Not on file  Other Topics Concern  . Not on file  Social History Narrative  . Not on file   Social Determinants of Health   Financial Resource Strain: Not on file  Food Insecurity: Not on file  Transportation Needs: Not on file  Physical Activity: Not on file  Stress: Not on file  Social Connections: Not on file  Intimate Partner Violence: Not on file    Physical Exam Vitals reviewed.  Constitutional:      General: She is not in acute distress.    Appearance: Normal  appearance.  HENT:     Head: Normocephalic.     Nose: Nose normal.     Mouth/Throat:     Mouth: Mucous membranes are moist.     Pharynx: Oropharynx is clear.  Eyes:     Pupils: Pupils are equal, round, and reactive to light.  Cardiovascular:     Rate and Rhythm: Normal rate and regular rhythm.     Pulses: Normal pulses.     Heart sounds: Normal heart sounds.  Pulmonary:     Effort: Pulmonary effort is normal. No respiratory distress.     Breath sounds: Normal breath sounds. No wheezing.  Abdominal:     Palpations: Abdomen is soft.     Hernia: A hernia is present.  Musculoskeletal:        General: Normal range of motion.     Cervical back: Normal range of motion.     Right lower leg: No edema.     Left lower leg: No edema.  Skin:    General: Skin is warm and dry.     Capillary Refill: Capillary refill takes less than 2 seconds.  Neurological:     General: No focal deficit present.     Mental Status: She is alert. Mental status is at baseline.  Psychiatric:        Mood and Affect: Mood normal.      Arrived for home visit for [facility]. Bianca Murray reports she is feeling good today and has been compliant with her medications. Bianca Murray stated she was supposed to have a tele-visit with Dr. Joya Gaskins on Monday but no one ever called her. I will follow up with same. Vitals were obtained and are as noted. Lung sounds clear with slight expiratory wheeze but no distress noted. Bianca Murray reports she had a slight cough earlier this week with some mucus production and took some prescribed cough syrup and felt much better. Medications were reviewed and confirmed. Pill box filled accordingly. Bianca Murray was appreciative of pill box and agreeable to plan to be seen weekly with pill box fills upon visits. No refills needed. Follow up with Dr. Joya Gaskins and GI appointment will be scheduled. Home visit complete.  I will see Bianca Murray in one week.    No future appointments.   ACTION: Home visit completed Next  visit planned for one week

## 2020-08-04 ENCOUNTER — Telehealth: Payer: Self-pay | Admitting: Critical Care Medicine

## 2020-08-04 NOTE — Telephone Encounter (Signed)
Copied from [place]oe [id number]. Topic: General - Inquiry >> Aug 04, 2020  1:44 PM [name] D wrote: Reason for CRM: Patient would like Dr. Joya Gaskins to give her a call back. Please advise

## 2020-08-04 NOTE — Telephone Encounter (Signed)
Attempt to call back. UTR, no answer, voicemail not set up.

## 2020-08-10 ENCOUNTER — Other Ambulatory Visit: Payer: Self-pay

## 2020-08-10 NOTE — Progress Notes (Signed)
Paramedicine Encounter    Patient ID: Bianca Murray, female    DOB: 01-24-1956, 64 y.o.   MRN: [id number]   Patient Care Team: Elsie Stain, MD as PCP - General (Pulmonary Disease)  Patient Active Problem List   Diagnosis Date Noted  . GERD (gastroesophageal reflux disease) 03/22/2020  . Abdominal bloating 12/10/2019  . Hypertension 12/03/2018  . Centrilobular emphysema ([place]) 12/03/2018  . Vitamin D deficiency 11/27/2018  . Chronic respiratory failure with hypoxia and hypercapnia (HCC)   . COPD GOLD D (chronic obstructive pulmonary disease) ([place]) 10/28/2018    Current Outpatient Medications:  .  albuterol (PROVENTIL) (2.5 MG/3ML) 0.083% nebulizer solution, Take 3 mLs (2.5 mg total) by nebulization 3 (three) times daily., Disp: 270 mL, Rfl: 1 .  albuterol (VENTOLIN HFA) 108 (90 Base) MCG/ACT inhaler, Inhale 2 puffs into the lungs every 6 (six) hours as needed for wheezing or shortness of breath., Disp: 18 g, Rfl: 3 .  amLODipine (NORVASC) 5 MG tablet, TAKE 1 TABLET(5 MG) BY MOUTH DAILY, Disp: 90 tablet, Rfl: 1 .  Blood Pressure Monitoring (BLOOD PRESSURE MONITOR AUTOMAT) DEVI, Measure blood pressure and pulse daily, Disp: 1 Device, Rfl: 0 .  clonazePAM (KLONOPIN) 0.5 MG tablet, TAKE 1 TABLET(0.5 MG) BY MOUTH DAILY AS NEEDED FOR ANXIETY, Disp: 20 tablet, Rfl: 0 .  gabapentin (NEURONTIN) 100 MG capsule, Take 1 capsule (100 mg total) by mouth 3 (three) times daily., Disp: 90 capsule, Rfl: 3 .  Glycopyrrolate-Formoterol (BEVESPI AEROSPHERE) 9-4.8 MCG/ACT AERO, Inhale 2 puffs into the lungs in the morning and at bedtime., Disp: 10.7 g, Rfl: 6 .  ibuprofen (ADVIL) 600 MG tablet, Take 1 tablet (600 mg total) by mouth every 6 (six) hours as needed., Disp: 60 tablet, Rfl: 0 .  loratadine (CLARITIN) 10 MG tablet, TAKE 1 TABLET(10 MG) BY MOUTH DAILY, Disp: 30 tablet, Rfl: 3 .  losartan (COZAAR) 100 MG tablet, Take 1 tablet (100 mg total) by mouth daily., Disp: 90 tablet, Rfl: 2 .  Misc. Devices  (PULSE OXIMETER FOR FINGER) MISC, 1 application by Does not apply route daily as needed., Disp: 1 each, Rfl: 0 .  Misc. Devices MISC, Nebulizer machine, Large shower chair.  Diagnosis- chronic respiratory failure, Disp: 1 each, Rfl: 0 .  pantoprazole (PROTONIX) 40 MG tablet, Take 1 tablet (40 mg total) by mouth 2 (two) times daily before a meal., Disp: 60 tablet, Rfl: 3 .  polyethylene glycol powder (GLYCOLAX/MIRALAX) 17 GM/SCOOP powder, Take 17 g by mouth daily as needed., Disp: 507 g, Rfl: 1 .  Simethicone 125 MG CAPS, Take 2 capsules (250 mg total) by mouth in the morning and at bedtime., Disp: 120 capsule, Rfl: 0 .  Spacer/Aero-Holding Chambers (AEROCHAMBER MV) inhaler, Use as instructed, Disp: 1 each, Rfl: 0 .  Vitamin D, Ergocalciferol, (DRISDOL) 1.25 MG (50000 UNIT) CAPS capsule, Take 1 capsule (50,000 Units total) by mouth every 7 (seven) days., Disp: 12 capsule, Rfl: 1 No Known Allergies   Social History   Socioeconomic History  . Marital status: Single    Spouse name: Not on file  . Number of children: Not on file  . Years of education: Not on file  . Highest education level: Not on file  Occupational History  . Not on file  Tobacco Use  . Smoking status: Former Research scientist (life sciences)  . Smokeless tobacco: Never Used  . Tobacco comment: quit smoking in 2018    Vaping Use  . Vaping Use: Never used  Substance and Sexual Activity  .  Alcohol use: Not Currently  . Drug use: Not Currently  . Sexual activity: Not on file  Other Topics Concern  . Not on file  Social History Narrative  . Not on file   Social Determinants of Health   Financial Resource Strain: Not on file  Food Insecurity: Not on file  Transportation Needs: Not on file  Physical Activity: Not on file  Stress: Not on file  Social Connections: Not on file  Intimate Partner Violence: Not on file    Physical Exam Vitals reviewed.  Constitutional:      Appearance: Normal appearance. She is normal weight.  HENT:      Head: Normocephalic.     Nose: Nose normal.     Mouth/Throat:     Mouth: Mucous membranes are moist.  Eyes:     Pupils: Pupils are equal, round, and reactive to light.  Cardiovascular:     Rate and Rhythm: Regular rhythm. Tachycardia present.     Pulses: Normal pulses.     Heart sounds: Normal heart sounds.  Pulmonary:     Effort: Respiratory distress present.     Breath sounds: Normal breath sounds. No wheezing.  Abdominal:     General: There is distension.  Musculoskeletal:        General: Normal range of motion.     Cervical back: Normal range of motion.     Right lower leg: No edema.     Left lower leg: No edema.  Skin:    General: Skin is warm and dry.     Capillary Refill: Capillary refill takes less than 2 seconds.  Neurological:     General: No focal deficit present.     Mental Status: She is alert. Mental status is at baseline.  Psychiatric:        Mood and Affect: Mood normal.    Arrived for home visit for [facility] who was alert and oriented appearing to be slightly short of breath. Isa said over the last several days she has noted she has been more short of breath than her normal when completing daily activities. Ting says se has been using her inhalers and taking daily medications but has not tried using her nebulizer. I encouraged her to use her nebulizer machine once or twice daily to assist with shortness of breath and tightness in her lungs. Lungs were clear but tightness with a slight expiratory wheeze could be heard. Vitals are as noted. Meds were verified and reviewed. Charidy filled pill box on her own and was successful. I talked with her about bubble packs and her and her daughter were interested in moving [facility] to get bubble packs. I messaged pharmacy and Dr. Delford Field to send over prescriptions to [facility]. I also set up her GI appointment for 1/21. We will be rescheduling visit with Dr. Delford Field. He was not in today to set this up but will return  tomorrow. Eber Jones agreed with plan. I will see her next week and she knows to call me if needed. Home visit complete. I will see her in one week.    Refills:  NONE     Future Appointments  Date Time Provider Department Center  09/02/2020  1:30 PM Arnaldo Natal, NP [facility]     ACTION: Home visit completed Next visit planned for one week

## 2020-08-15 ENCOUNTER — Other Ambulatory Visit: Payer: Self-pay | Admitting: Critical Care Medicine

## 2020-08-17 ENCOUNTER — Other Ambulatory Visit (HOSPITAL_COMMUNITY): Payer: Self-pay

## 2020-08-17 NOTE — Progress Notes (Signed)
Paramedicine Encounter    Patient ID: Bianca Murray, female    DOB: Sep 08, 1955, 65 y.o.   MRN: [id number]   Patient Care Team: Elsie Stain, MD as PCP - General (Pulmonary Disease)  Patient Active Problem List   Diagnosis Date Noted  . GERD (gastroesophageal reflux disease) 03/22/2020  . Abdominal bloating 12/10/2019  . Hypertension 12/03/2018  . Centrilobular emphysema ([place]) 12/03/2018  . Vitamin D deficiency 11/27/2018  . Chronic respiratory failure with hypoxia and hypercapnia (HCC)   . COPD GOLD D (chronic obstructive pulmonary disease) ([place]) 10/28/2018    Current Outpatient Medications:  .  albuterol (PROVENTIL) (2.5 MG/3ML) 0.083% nebulizer solution, Take 3 mLs (2.5 mg total) by nebulization 3 (three) times daily., Disp: 270 mL, Rfl: 1 .  albuterol (VENTOLIN HFA) 108 (90 Base) MCG/ACT inhaler, Inhale 2 puffs into the lungs every 6 (six) hours as needed for wheezing or shortness of breath., Disp: 18 g, Rfl: 3 .  amLODipine (NORVASC) 5 MG tablet, TAKE 1 TABLET(5 MG) BY MOUTH DAILY, Disp: 90 tablet, Rfl: 1 .  Blood Pressure Monitoring (BLOOD PRESSURE MONITOR AUTOMAT) DEVI, Measure blood pressure and pulse daily, Disp: 1 Device, Rfl: 0 .  clonazePAM (KLONOPIN) 0.5 MG tablet, TAKE 1 TABLET(0.5 MG) BY MOUTH DAILY AS NEEDED FOR ANXIETY, Disp: 20 tablet, Rfl: 0 .  gabapentin (NEURONTIN) 100 MG capsule, Take 1 capsule (100 mg total) by mouth 3 (three) times daily., Disp: 90 capsule, Rfl: 3 .  Glycopyrrolate-Formoterol (BEVESPI AEROSPHERE) 9-4.8 MCG/ACT AERO, Inhale 2 puffs into the lungs in the morning and at bedtime., Disp: 10.7 g, Rfl: 6 .  ibuprofen (ADVIL) 600 MG tablet, TAKE 1 TABLET(600 MG) BY MOUTH EVERY 6 HOURS AS NEEDED, Disp: 90 tablet, Rfl: 0 .  loratadine (CLARITIN) 10 MG tablet, TAKE 1 TABLET(10 MG) BY MOUTH DAILY, Disp: 30 tablet, Rfl: 3 .  losartan (COZAAR) 100 MG tablet, Take 1 tablet (100 mg total) by mouth daily., Disp: 90 tablet, Rfl: 2 .  Misc. Devices (PULSE  OXIMETER FOR FINGER) MISC, 1 application by Does not apply route daily as needed., Disp: 1 each, Rfl: 0 .  Misc. Devices MISC, Nebulizer machine, Large shower chair.  Diagnosis- chronic respiratory failure, Disp: 1 each, Rfl: 0 .  pantoprazole (PROTONIX) 40 MG tablet, Take 1 tablet (40 mg total) by mouth 2 (two) times daily before a meal., Disp: 60 tablet, Rfl: 3 .  polyethylene glycol powder (GLYCOLAX/MIRALAX) 17 GM/SCOOP powder, Take 17 g by mouth daily as needed. (Patient not taking: Reported on 08/10/2020), Disp: 507 g, Rfl: 1 .  Simethicone 125 MG CAPS, Take 2 capsules (250 mg total) by mouth in the morning and at bedtime., Disp: 120 capsule, Rfl: 0 .  Spacer/Aero-Holding Chambers (AEROCHAMBER MV) inhaler, Use as instructed, Disp: 1 each, Rfl: 0 .  Vitamin D, Ergocalciferol, (DRISDOL) 1.25 MG (50000 UNIT) CAPS capsule, Take 1 capsule (50,000 Units total) by mouth every 7 (seven) days., Disp: 12 capsule, Rfl: 1 No Known Allergies   Social History   Socioeconomic History  . Marital status: Single    Spouse name: Not on file  . Number of children: Not on file  . Years of education: Not on file  . Highest education level: Not on file  Occupational History  . Not on file  Tobacco Use  . Smoking status: Former Research scientist (life sciences)  . Smokeless tobacco: Never Used  . Tobacco comment: quit smoking in 2018    Vaping Use  . Vaping Use: Never used  Substance and Sexual  Activity  . Alcohol use: Not Currently  . Drug use: Not Currently  . Sexual activity: Not on file  Other Topics Concern  . Not on file  Social History Narrative  . Not on file   Social Determinants of Health   Financial Resource Strain: Not on file  Food Insecurity: Not on file  Transportation Needs: Not on file  Physical Activity: Not on file  Stress: Not on file  Social Connections: Not on file  Intimate Partner Violence: Not on file    Physical Exam Vitals reviewed.  Constitutional:      Appearance: She is normal  weight.  HENT:     Head: Normocephalic.     Nose: Nose normal.     Mouth/Throat:     Mouth: Mucous membranes are moist.     Pharynx: Oropharynx is clear.  Eyes:     Pupils: Pupils are equal, round, and reactive to light.  Cardiovascular:     Rate and Rhythm: Normal rate and regular rhythm.     Pulses: Normal pulses.     Heart sounds: Normal heart sounds.  Pulmonary:     Effort: Pulmonary effort is normal.  Abdominal:     General: Abdomen is flat.     Palpations: Abdomen is soft.  Musculoskeletal:        General: Normal range of motion.     Cervical back: Normal range of motion.     Right lower leg: No edema.     Left lower leg: No edema.  Skin:    General: Skin is warm and dry.     Capillary Refill: Capillary refill takes less than 2 seconds.  Neurological:     General: No focal deficit present.     Mental Status: She is alert. Mental status is at baseline.  Psychiatric:        Mood and Affect: Mood normal.     Arrived for home visit for [name] who was alert and oriented reporting she was feeling sick with a cough with yellow tinged flem and a loss of taste since Monday. Patient denied increased work of breathing since her symptoms started. The patient reports she has not taken a COVID test, I advised her to go to [facility] for a COVID Test. She agreed with plan. I assessed vitals and they are as noted. Patient reports she will notify me of her test results. I will plan to see her in one week. Home visit complete.    -Bubble packs being filled at [facility] starting this week.     Future Appointments  Date Time Provider Department Center  09/02/2020  1:30 PM Arnaldo Natal, NP [facility]     ACTION: Home visit completed Next visit planned for one week

## 2020-08-23 ENCOUNTER — Telehealth: Payer: Self-pay | Admitting: Critical Care Medicine

## 2020-08-23 NOTE — Telephone Encounter (Signed)
Will need to be seen any provider , telehealth visit video preferred

## 2020-08-23 NOTE — Telephone Encounter (Signed)
Patient requesting abx and cough syrup. Last seen 06/27/2020. Pt will likely need to be seen - no details left concerning symptoms, complaints, etc on telephone encounter routed to me.

## 2020-08-23 NOTE — Telephone Encounter (Signed)
Medication Refill - Medication: Promethazine DM syrup, antibiotic   Has the patient contacted their pharmacy? Yes.   (Agent: If no, request that the patient contact the pharmacy for the refill.) (Agent: If yes, when and what did the pharmacy advise?)  Preferred Pharmacy (with phone number or street name):  [facility] [phone] - [place], [place] - [address]  [place] [address]  Phone: [phone] Fax: [phone]  Hours: Not open 24 hours     Agent: Please be advised that RX refills may take up to 3 business days. We ask that you follow-up with your pharmacy.

## 2020-08-24 ENCOUNTER — Telehealth: Payer: Self-pay

## 2020-08-24 ENCOUNTER — Telehealth (HOSPITAL_COMMUNITY): Payer: Self-pay

## 2020-08-24 ENCOUNTER — Ambulatory Visit (INDEPENDENT_AMBULATORY_CARE_PROVIDER_SITE_OTHER): Payer: Medicare Other | Admitting: Physician Assistant

## 2020-08-24 ENCOUNTER — Encounter: Payer: Self-pay | Admitting: Physician Assistant

## 2020-08-24 DIAGNOSIS — Z20822 Contact with and (suspected) exposure to covid-19: Secondary | ICD-10-CM | POA: Diagnosis not present

## 2020-08-24 DIAGNOSIS — J418 Mixed simple and mucopurulent chronic bronchitis: Secondary | ICD-10-CM | POA: Diagnosis not present

## 2020-08-24 DIAGNOSIS — J9612 Chronic respiratory failure with hypercapnia: Secondary | ICD-10-CM

## 2020-08-24 DIAGNOSIS — J9611 Chronic respiratory failure with hypoxia: Secondary | ICD-10-CM | POA: Diagnosis not present

## 2020-08-24 MED ORDER — PROMETHAZINE-DM 6.25-15 MG/5ML PO SYRP: 5.0000 mL | ORAL_SOLUTION | Freq: Four times a day (QID) | ORAL | 0 refills | Status: DC | PRN

## 2020-08-24 MED ORDER — AZITHROMYCIN 250 MG PO TABS: ORAL_TABLET | ORAL | 0 refills | Status: DC

## 2020-08-24 NOTE — Telephone Encounter (Signed)
Spoke to [place] who reports she is on quarantine due to her and daughter and her are sick with covid like symptoms. Confirmed at [phone] patients daughter is COVID +.  I told Nuriya she needs a telehealth visit with a provider for the cough medicines she requested and her symptoms per Dr. Joya Gaskins. I called [facility] and they are getting her scheduled today for a telehealth visit. Patient also requesting a thermometer, I will check with [facility] to add this into her order for medications when delivered. I will follow up with patient in the coming days to check on her. Call complete.

## 2020-08-24 NOTE — Progress Notes (Signed)
Established Patient Office Visit  Subjective:  Patient ID: Bianca Murray, female    DOB: 08/31/1955  Age: 65 y.o. MRN: [id number]  CC: No chief complaint on file.  Virtual Visit via Telephone Note  I connected with Bianca Murray on 08/24/20 at 10:10 AM EST by telephone and verified that I am speaking with the correct person using two identifiers.  Location: Patient: Home Provider: [facility]   I discussed the limitations, risks, security and privacy concerns of performing an evaluation and management service by telephone and the availability of in person appointments. I also discussed with the patient that there may be a patient responsible charge related to this service. The patient expressed understanding and agreed to proceed.   History of Present Illness:  Bianca Murray reports that she has been having a productive cough with green and yellow sputum for the past 3 days, headache.  Reports that the cough is keeping her awake at night, states that she has been using her nebulizer twice a day in addition to her daily inhalers without much relief.  Reports that she has been checking her pulse oxygen level, reports recent reading was 96.  Denies fever, chills, body aches, sore throat.  Reports that her live-in daughter tested positive for COVID today.    Reports that she has received  Moderna COVID-vaccine, received her booster dose approximately 1 month ago.  Has not had recent flu shot.       Observations/Objective: Medical history and current medications reviewed, no physical exam completed      Past Medical History:  Diagnosis Date  . Arthritis   . Asthma   . Cancer ([place])   . Chronic respiratory failure with hypoxia and hypercapnia (HCC)   . COPD (chronic obstructive pulmonary disease) ([place])   . Hypertension     Past Surgical History:  Procedure Laterality Date  . ABDOMINAL HYSTERECTOMY    . TRACHEOSTOMY     reversed  . TRACHEOSTOMY CLOSURE       Family History  Problem Relation Age of Onset  . Hypertension Mother   . Heart disease Mother   . Cancer Mother   . Asthma Father   . Asthma Sister   . Hypertension Sister   . Asthma Brother   . Cancer Brother     Social History   Socioeconomic History  . Marital status: Single    Spouse name: Not on file  . Number of children: Not on file  . Years of education: Not on file  . Highest education level: Not on file  Occupational History  . Not on file  Tobacco Use  . Smoking status: Former Research scientist (life sciences)  . Smokeless tobacco: Never Used  . Tobacco comment: quit smoking in 2018    Vaping Use  . Vaping Use: Never used  Substance and Sexual Activity  . Alcohol use: Not Currently  . Drug use: Not Currently  . Sexual activity: Not on file  Other Topics Concern  . Not on file  Social History Narrative  . Not on file   Social Determinants of Health   Financial Resource Strain: Not on file  Food Insecurity: Not on file  Transportation Needs: Not on file  Physical Activity: Not on file  Stress: Not on file  Social Connections: Not on file  Intimate Partner Violence: Not on file    Outpatient Medications Prior to Visit  Medication Sig Dispense Refill  . albuterol (PROVENTIL) (2.5 MG/3ML) 0.083% nebulizer solution Take 3 mLs (  2.5 mg total) by nebulization 3 (three) times daily. 270 mL 1  . albuterol (VENTOLIN HFA) 108 (90 Base) MCG/ACT inhaler Inhale 2 puffs into the lungs every 6 (six) hours as needed for wheezing or shortness of breath. 18 g 3  . amLODipine (NORVASC) 5 MG tablet TAKE 1 TABLET(5 MG) BY MOUTH DAILY 90 tablet 1  . Blood Pressure Monitoring (BLOOD PRESSURE MONITOR AUTOMAT) DEVI Measure blood pressure and pulse daily 1 Device 0  . clonazePAM (KLONOPIN) 0.5 MG tablet TAKE 1 TABLET(0.5 MG) BY MOUTH DAILY AS NEEDED FOR ANXIETY 20 tablet 0  . gabapentin (NEURONTIN) 100 MG capsule Take 1 capsule (100 mg total) by mouth 3 (three) times daily. 90 capsule 3  .  Glycopyrrolate-Formoterol (BEVESPI AEROSPHERE) 9-4.8 MCG/ACT AERO Inhale 2 puffs into the lungs in the morning and at bedtime. 10.7 g 6  . ibuprofen (ADVIL) 600 MG tablet TAKE 1 TABLET(600 MG) BY MOUTH EVERY 6 HOURS AS NEEDED 90 tablet 0  . loratadine (CLARITIN) 10 MG tablet TAKE 1 TABLET(10 MG) BY MOUTH DAILY 30 tablet 3  . losartan (COZAAR) 100 MG tablet Take 1 tablet (100 mg total) by mouth daily. 90 tablet 2  . Misc. Devices (PULSE OXIMETER FOR FINGER) MISC 1 application by Does not apply route daily as needed. 1 each 0  . Misc. Devices MISC Nebulizer machine, Large shower chair.  Diagnosis- chronic respiratory failure 1 each 0  . pantoprazole (PROTONIX) 40 MG tablet Take 1 tablet (40 mg total) by mouth 2 (two) times daily before a meal. 60 tablet 3  . polyethylene glycol powder (GLYCOLAX/MIRALAX) 17 GM/SCOOP powder Take 17 g by mouth daily as needed. (Patient not taking: No sig reported) 507 g 1  . Simethicone 125 MG CAPS Take 2 capsules (250 mg total) by mouth in the morning and at bedtime. 120 capsule 0  . Spacer/Aero-Holding Chambers (AEROCHAMBER MV) inhaler Use as instructed 1 each 0  . Vitamin D, Ergocalciferol, (DRISDOL) 1.25 MG (50000 UNIT) CAPS capsule Take 1 capsule (50,000 Units total) by mouth every 7 (seven) days. 12 capsule 1   No facility-administered medications prior to visit.    No Known Allergies  ROS Review of Systems  Constitutional: Negative for chills, fatigue and fever.  HENT: Positive for congestion. Negative for sore throat and trouble swallowing.   Eyes: Negative.   Respiratory: Positive for cough. Negative for shortness of breath and wheezing.   Cardiovascular: Negative for chest pain and palpitations.  Gastrointestinal: Negative for abdominal pain, diarrhea, nausea and vomiting.  Endocrine: Negative.   Genitourinary: Negative.   Musculoskeletal: Negative for myalgias.  Skin: Negative.   Allergic/Immunologic: Negative.   Neurological: Positive for  headaches.  Hematological: Negative.   Psychiatric/Behavioral: Negative.       Objective:     There were no vitals taken for this visit. Wt Readings from Last 3 Encounters:  08/10/20 214 lb (97.1 kg)  07/13/20 215 lb (97.5 kg)  01/22/19 204 lb (92.5 kg)     Health Maintenance Due  Topic Date Due  . PNA vac Low Risk Adult (1 of 2 - PCV13) Never done  . INFLUENZA VACCINE  Never done    There are no preventive care reminders to display for this patient.  No results found for: TSH Lab Results  Component Value Date   WBC 13.6 (H) 11/04/2018   HGB 10.8 (L) 11/04/2018   HCT 35.1 (L) 11/04/2018   MCV 90.9 11/04/2018   PLT 308 11/04/2018   Lab Results  Component Value Date   NA 138 11/04/2018   K 4.2 11/04/2018   CO2 33 (H) 11/04/2018   GLUCOSE 184 (H) 11/04/2018   BUN 19 11/04/2018   CREATININE 0.80 11/04/2018   BILITOT 0.3 10/28/2018   ALKPHOS 70 10/28/2018   AST 17 10/28/2018   ALT 10 10/28/2018   PROT 6.9 10/28/2018   ALBUMIN 3.5 10/28/2018   CALCIUM 9.1 11/04/2018   ANIONGAP 7 11/04/2018   No results found for: CHOL No results found for: HDL No results found for: LDLCALC No results found for: TRIG No results found for: CHOLHDL No results found for: HGBA1C    Assessment & Plan:   Problem List Items Addressed This Visit   None      Assessment and Plan: 1. Close exposure to COVID-19 virus Strongly encouraged patient to come to clinic for COVID testing, patient is unable to obtain transportation.  Patient did take home COVID test during visit, states result was negative.  Patient education given on self quarantine due to close contact exposure, all red flags given for prompt reevaluation.  Patient does have frequent visits from [facility], EMT, one is scheduled in the next few days  2. Mixed simple and mucopurulent chronic bronchitis ([place]) Reports that she is unable to take steroids, states that they make her jittery -  promethazine-dextromethorphan (PROMETHAZINE-DM) 6.25-15 MG/5ML syrup; Take 5 mLs by mouth 4 (four) times daily as needed for cough.  Dispense: 240 mL; Refill: 0 - azithromycin (ZITHROMAX) 250 MG tablet; Take two once then one daily until gone  Dispense: 6 tablet; Refill: 0  3. Chronic respiratory failure with hypoxia and hypercapnia (HCC)    Follow Up Instructions:    I discussed the assessment and treatment plan with the patient. The patient was provided an opportunity to ask questions and all were answered. The patient agreed with the plan and demonstrated an understanding of the instructions.   The patient was advised to call back or seek an in-person evaluation if the symptoms worsen or if the condition fails to improve as anticipated.  I provided 21 minutes of non-face-to-face time during this encounter.   No orders of the defined types were placed in this encounter.   Follow-up: No follow-ups on file.    Loraine Grip Mayers, PA-C

## 2020-08-24 NOTE — Patient Instructions (Addendum)
Your home rapid COVID testing was negative, however I do encourage you to continue quarantine due to your daughter testing positive and your elevated health risk.  I have sent azithromycin and cough medication to your pharmacy.  Continue to stay well-hydrated, use your breathing treatment and inhalers as directed, monitor your oxygen levels.  Please let us know if your symptoms worsen or do not improve.  I hope that you feel better soon.  [name], PA-C Physician Assistant [facility] [email or web address]   COVID-19 Quarantine vs. Isolation [name] keeps someone who was in close contact with someone who has COVID-19 away from others. Quarantine if you have been in close contact with someone who has COVID-19, unless you have been fully vaccinated. If you are fully vaccinated  You do NOT need to quarantine unless they have symptoms  Get tested 3-5 days after your exposure, even if you don't have symptoms  Wear a mask indoors in public for 14 days following exposure or until your test result is negative If you are not fully vaccinated  Stay home for 14 days after your last contact with a person who has COVID-19  Watch for fever (100.37F), cough, shortness of breath, or other symptoms of COVID-19  If possible, stay away from people you live with, especially people who are at higher risk for getting very sick from COVID-19  Contact your local public health department for options in your area to possibly shorten your quarantine ISOLATION keeps someone who is sick or tested positive for COVID-19 without symptoms away from others, even in their own home. People who are in isolation should stay home and stay in a specific "sick room" or area and use a separate bathroom (if available). If you are sick and think or know you have COVID-19 Stay home until after  At least 10 days since symptoms first appeared and  At least 24  hours with no fever without the use of fever-reducing medications and  Symptoms have improved If you tested positive for COVID-19 but do not have symptoms  Stay home until after 10 days have passed since your positive viral test  If you develop symptoms after testing positive, follow the steps above for those who are sick [email or web address] 05/09/2020 This information is not intended to replace advice given to you by your health care provider. Make sure you discuss any questions you have with your health care provider. Document Revised: 06/13/2020 Document Reviewed: 06/13/2020 Elsevier Patient Education  2021 [employer].

## 2020-08-24 NOTE — Progress Notes (Signed)
Patient verified DOB Patient complains of cold symptoms beginning 3 days ago. Patient complains of productive cough with green and yellow sputum. Patient denies nasal congestion. Patient complains of HA and no body aches. Patient has low appetite. Patients taste and smell is present. Patient denies N/V/ diarrhea. Patients daughter is positive for COVID.

## 2020-08-24 NOTE — Telephone Encounter (Signed)
Call received from [name], EMT noting that patient needs televisit  appointment scheduled as she is requesting cough medicine and reports COVID like symptoms.Carrolyn Meiers, PA notified of the request and appointment scheduled for patient to be seen today.

## 2020-08-30 ENCOUNTER — Telehealth (HOSPITAL_COMMUNITY): Payer: Self-pay

## 2020-08-30 NOTE — Telephone Encounter (Signed)
Attempted to reach out to [place] to check in. I will continue to follow.

## 2020-09-02 ENCOUNTER — Ambulatory Visit: Payer: Medicare Other | Admitting: Nurse Practitioner

## 2020-09-07 ENCOUNTER — Other Ambulatory Visit (HOSPITAL_COMMUNITY): Payer: Self-pay

## 2020-09-07 NOTE — Progress Notes (Signed)
Paramedicine Encounter    Patient ID: Bianca Murray, female    DOB: 11-08-1955, 65 y.o.   MRN: [id number]   Patient Care Team: Bianca Stain, MD as PCP - General (Pulmonary Disease)  Patient Active Problem List   Diagnosis Date Noted  . GERD (gastroesophageal reflux disease) 03/22/2020  . Abdominal bloating 12/10/2019  . Hypertension 12/03/2018  . Centrilobular emphysema ([place]) 12/03/2018  . Vitamin D deficiency 11/27/2018  . Chronic respiratory failure with hypoxia and hypercapnia (H[facility])   . COPD GOLD D (chronic obstructive pulmonary disease) ([place]) 10/28/2018    Current Outpatient Medications:  .  albuterol (PROVENTIL) (2.5 MG/3ML) 0.083% nebulizer solution, Take 3 mLs (2.5 mg total) by nebulization 3 (three) times daily., Disp: 270 mL, Rfl: 1 .  albuterol (VENTOLIN HFA) 108 (90 Base) MCG/ACT inhaler, Inhale 2 puffs into the lungs every 6 (six) hours as needed for wheezing or shortness of breath., Disp: 18 g, Rfl: 3 .  amLODipine (NORVASC) 5 MG tablet, TAKE 1 TABLET(5 MG) BY MOUTH DAILY, Disp: 90 tablet, Rfl: 1 .  azithromycin (ZITHROMAX) 250 MG tablet, Take two once then one daily until gone, Disp: 6 tablet, Rfl: 0 .  Blood Pressure Monitoring (BLOOD PRESSURE MONITOR AUTOMAT) DEVI, Measure blood pressure and pulse daily, Disp: 1 Device, Rfl: 0 .  clonazePAM (KLONOPIN) 0.5 MG tablet, TAKE 1 TABLET(0.5 MG) BY MOUTH DAILY AS NEEDED FOR ANXIETY, Disp: 20 tablet, Rfl: 0 .  gabapentin (NEURONTIN) 100 MG capsule, Take 1 capsule (100 mg total) by mouth 3 (three) times daily., Disp: 90 capsule, Rfl: 3 .  Glycopyrrolate-Formoterol (BEVESPI AEROSPHERE) 9-4.8 MCG/ACT AERO, Inhale 2 puffs into the lungs in the morning and at bedtime., Disp: 10.7 g, Rfl: 6 .  ibuprofen (ADVIL) 600 MG tablet, TAKE 1 TABLET(600 MG) BY MOUTH EVERY 6 HOURS AS NEEDED, Disp: 90 tablet, Rfl: 0 .  loratadine (CLARITIN) 10 MG tablet, TAKE 1 TABLET(10 MG) BY MOUTH DAILY, Disp: 30 tablet, Rfl: 3 .  losartan (COZAAR) 100 MG  tablet, Take 1 tablet (100 mg total) by mouth daily., Disp: 90 tablet, Rfl: 2 .  Misc. Devices (PULSE OXIMETER FOR FINGER) MISC, 1 application by Does not apply route daily as needed., Disp: 1 each, Rfl: 0 .  Misc. Devices MISC, Nebulizer machine, Large shower chair.  Diagnosis- chronic respiratory failure, Disp: 1 each, Rfl: 0 .  pantoprazole (PROTONIX) 40 MG tablet, Take 1 tablet (40 mg total) by mouth 2 (two) times daily before a meal., Disp: 60 tablet, Rfl: 3 .  polyethylene glycol powder (GLYCOLAX/MIRALAX) 17 GM/SCOOP powder, Take 17 g by mouth daily as needed. (Patient not taking: No sig reported), Disp: 507 g, Rfl: 1 .  promethazine-dextromethorphan (PROMETHAZINE-DM) 6.25-15 MG/5ML syrup, Take 5 mLs by mouth 4 (four) times daily as needed for cough., Disp: 240 mL, Rfl: 0 .  Simethicone 125 MG CAPS, Take 2 capsules (250 mg total) by mouth in the morning and at bedtime., Disp: 120 capsule, Rfl: 0 .  Spacer/Aero-Holding Chambers (AEROCHAMBER MV) inhaler, Use as instructed, Disp: 1 each, Rfl: 0 .  Vitamin D, Ergocalciferol, (DRISDOL) 1.25 MG (50000 UNIT) CAPS capsule, Take 1 capsule (50,000 Units total) by mouth every 7 (seven) days., Disp: 12 capsule, Rfl: 1 No Known Allergies   Social History   Socioeconomic History  . Marital status: Single    Spouse name: Not on file  . Number of children: Not on file  . Years of education: Not on file  . Highest education level: Not on file  Occupational History  . Not on file  Tobacco Use  . Smoking status: Former Research scientist (life sciences)  . Smokeless tobacco: Never Used  . Tobacco comment: quit smoking in 2018    Vaping Use  . Vaping Use: Never used  Substance and Sexual Activity  . Alcohol use: Not Currently  . Drug use: Not Currently  . Sexual activity: Not on file  Other Topics Concern  . Not on file  Social History Narrative  . Not on file   Social Determinants of Health   Financial Resource Strain: Not on file  Food Insecurity: Not on file   Transportation Needs: Not on file  Physical Activity: Not on file  Stress: Not on file  Social Connections: Not on file  Intimate Partner Violence: Not on file    Physical Exam Vitals reviewed.  Constitutional:      Appearance: She is normal weight.  HENT:     Head: Normocephalic.     Nose: Nose normal.     Mouth/Throat:     Mouth: Mucous membranes are moist.     Pharynx: Oropharynx is clear.  Eyes:     Pupils: Pupils are equal, round, and reactive to light.  Cardiovascular:     Rate and Rhythm: Normal rate and regular rhythm.     Pulses: Normal pulses.     Heart sounds: Normal heart sounds.  Pulmonary:     Effort: Pulmonary effort is normal.     Breath sounds: Normal breath sounds.  Abdominal:     General: Abdomen is flat.     Palpations: Abdomen is soft.  Musculoskeletal:        General: Normal range of motion.     Cervical back: Normal range of motion.     Right lower leg: No edema.     Left lower leg: No edema.  Skin:    General: Skin is warm and dry.     Capillary Refill: Capillary refill takes less than 2 seconds.  Neurological:     General: No focal deficit present.     Mental Status: She is alert. Mental status is at baseline.  Psychiatric:        Mood and Affect: Mood normal.     Arrived for home visit for Bianca Murray who was alert and oriented reporting feeling much better today. Vitals obtained. I delivered bubble packs and reviewed same with chart. Bianca Murray and I reviewed bubble packs and she understood how to use them. I gave her the number to call [facility] to reschedule appointment. She agreed. And Daughter agreed to assist her. No further needs at this time. I will see patient bi-weekly for now. Home visit complete.      No future appointments.   ACTION: Home visit completed Next visit planned for two weeks

## 2020-09-08 ENCOUNTER — Telehealth (HOSPITAL_COMMUNITY): Payer: Self-pay

## 2020-09-08 NOTE — Telephone Encounter (Signed)
Mrs. Marczak called me reporting she feels her Klonopin is longer working, she requested I reach out to her PCP reference same. I will forward message to Dr. Joya Gaskins for advice.

## 2020-09-09 NOTE — Telephone Encounter (Addendum)
Scheduled for Monday January 31st at 0830/0900 Patient aware.

## 2020-09-09 NOTE — Telephone Encounter (Signed)
Will need video/phone visit ?next week add on?

## 2020-09-11 NOTE — Progress Notes (Signed)
Subjective:  Virtual Visit via telephone Note  I connected with@ on 09/12/20 at@ by atelephone enabled telemedicine application and verified that I am speaking with the correct person using two identifiers.   Consent:  I discussed the limitations, risks, security and privacy concerns of performing an evaluation and management service by phone visit and the availability of in person appointments. I also discussed with the patient that there may be a patient responsible charge related to this service. The patient expressed understanding and agreed to proceed.  Location of patient: Patient at home  Location of provider: I am in my office  Persons participating in the televisit with the patient.   noone else on the call   History of Present Illness:   Patient ID: Bianca Murray, female    DOB: Jun 15, 1956, 65 y.o.   MRN: [id number] 11/27/18 This is a 65 year old female with advanced chronic obstructive lung disease.  Pulmonary functions have been obtained earlier in 2019 by her local pulmonologist in [facility].  This revealed an FEV1 of 25% predicted diffusion capacity of 20% predicted.  This places the patient in the Gold stage D COPD category with significant advanced emphysema. The patient did remove recently from her home in [place] to be closer to her daughter and is now staying in her daughter's home in this move occurred in January 2020.  Since that time the patient began having viral symptoms of an upper respiratory tract infection and was admitted to the hospital on March 17 being discharged on the 25th.  Below are excerpts from the discharge summary  Admit date: 10/28/2018 Discharge date: 11/05/2018  Discharge Diagnosis:  Principal Problem:   Acute on chronic respiratory failure with hypoxia and hypercapnia (HCC) Active Problems:   COPD with acute exacerbation (HCC)   COPD (chronic obstructive pulmonary disease) (HCC)   Chronic respiratory failure with hypoxia and  hypercapnia (HCC)   Hyperglycemia  History of Present Illness:  Bianca Murray a 65 y.o.femalewith medical history significant ofO2 dependent COPD, Asthma. She has required intubation and tracheostomy in the past (though this has been reversed). She moved down to the area a few weeks ago from [place]. No known sick contacts. For the past 4-5 days she has had increased SOB, cough, wheezing. This has been gradually worsening since onset. She is on 3-4L O2 at baseline.   Hospital Course by Problem:  Acute on chronic hypoxic and hypercapnic respiratory failure Secondary to COPD.Slight improvement today.Sats 96%on 3L.Explained to patient needto keep sat between 89-92%.Patientcontinues to feel anxietyand reports worsening sob when oxygen weaned. Improved air movement with less wheeze today -will weansolumedrol from 60mg  every 6hours to evey 8 hours -continue towean oxygen -may need  COPD exacerbation Chestx-ray with no acute abnormality. Hx of ESLD has been evaluated to lung transplant.Patient was unable to tolerate BiPAP.notes from pulmonology reveal most recent visit 01/2017. PFT at that time with showed severe obstructive ventilatory defect, air trapping and mildly reduced diffusion capacity - continuenebs with Brovana and Pulmicort  -will need OP pulm referral  Essential hypertension -resume home meds  Normocytic anemia Hemoglobin stable, outpatient follow-up recommended.  Hyperglycemia: likely related to steroids -carb mod diet  The patient is seen in follow-up today and note has had her oxygen system switch back to [place].  She now has an inogen portable oxygen concentrator.  She runs 3 L continuous.  Her saturations remained in the mid 90s on this.  She states she is still dyspneic with minimal exertion.  She was on  Anoro but it was not covered by her insurance and we switched her to Spiriva she is not doing as well.  She states mucus gets stuck  in her throat.  There is no chest pain.  She is sleeping well.  08/19/2019 Last seen 01/2019 Pt with more dyspnea, cough, no chest pain.  Notes more phlegm is present.  Needs a pulse ox. BP ok    On 4L oxygen.   Cannot tolerate prednisone or inhaled steroid.  Gold D Copd stage D  09/15/2019 Since the last visit the patient's cough has returned.  She is coughing up more thick mucus.  She has wheezing but no fever or chest pain.  She is having more dyspnea.  She states the antibiotic at the last visit was beneficial.  She is maintaining the Anoro and the albuterol nebulizer  12/10/2019 Video visit  Patient is seen in follow-up and states that her cough is markedly improved however she complains of abdominal discomfort increased gas in the stomach early satiety and also constipation he states the cough medicine is useful and would like a refill she maintains the Anoro 1 puff a day she is yet to receive her Covid vaccine.  She notes no belching but has panic attacks at times if she has to get ready in the morning.  She has no real reflux symptoms at this time.  She is now living with her daughter in a new location.   03/10/2020 This is a video visit with this patient has advanced chronic obstructive lung disease. She states her shortness of breath is getting worse. He is coughing and wheezing more and having more mucus that is difficult to raise. She is on the lisinopril and states the back of her throat is irritated. She has had to increase her oxygen up to 4 L. She is on her new inhalers and is using a spacer device. I went over the proper use of the spacer device on the video with the patient. The patient is essentially homebound. She did receive her Covid vaccine still homebound vaccine program.  03/22/2020 This visit is a housecall and that the patient is now homebound and not able to come to the office for examinations and also due to the fact that the Covid viral infection has spiked again in the  county and is not safe for the patient to leave her home due to her severe lung disease   Patient states she continues to have shortness of breath and cough.  The cough is worse at night when she tries to sleep.  She has nasal congestion postnasal drainage.  She rattles in the chest.  She has taken prednisone in the past but she has adverse side effects from this and cannot tolerate this.  The patient states she is having excess gas belching burping and gas from below with abdominal distention.  She is not having bowel movements regularly.  She does maintain oxygen 4 L continuous at this time.  She is maintaining the Bevespi inhaler 2 puffs twice daily with an AeroChamber.  Patient does have a prescription for Promethazine DM but takes it rarely.  She states Gas-X was of no benefit to her.  She states Senokot also did not help her as well.  Patient does maintain losartan 100 mg daily amlodipine 5 mg daily for hypertension.  She denies headache chest pain or other cardiovascular complaints at this time.  She does maintain vitamin D weekly for severe vitamin D deficiency  06/27/2020  This patient was seen by way of a video visit today.  She has been noting increased abdominal bloating and increased wheezing cough some degree of shortness of breath She has increased rhinorrhea.  She will need a Pneumovax and also flu shot as well as Covid booster shot She has a home blood pressure monitor her blood pressure has been running 142/79 in the home  1/31: Patient is seen in return visit today by way of a telephone visit.  Her largest complaint is severe anxiety when she is showering.  She takes 2.5 mg Klonopin only before shower and states this is not sufficient for her.  She also states she is only taking the inhaler once a day but it is prescribed twice a day.  Patient denies any chest pain increased mucus or other complaints at this time.  Patient is interested in getting a shower chair that may fit her  bathtub more effectively.  Patient is due a flu shot and Pneumovax and she is already received her Covid booster  Shortness of Breath This is a chronic problem. The current episode started more than 1 year ago. The problem occurs daily. The problem has been unchanged. Associated symptoms include leg swelling and rhinorrhea. Pertinent negatives include no abdominal pain, chest pain, ear pain, fever, headaches, hemoptysis, leg pain, PND, sputum production, vomiting or wheezing. The symptoms are aggravated by lying flat and exercise. She has tried beta agonist inhalers, ipratropium inhalers and oral steroids for the symptoms. The treatment provided significant relief. Her past medical history is significant for COPD.    Past Medical History:  Diagnosis Date  . Arthritis   . Asthma   . Cancer ([place])   . Chronic respiratory failure with hypoxia and hypercapnia (HCC)   . COPD (chronic obstructive pulmonary disease) ([place])   . Hypertension      Family History  Problem Relation Age of Onset  . Hypertension Mother   . Heart disease Mother   . Cancer Mother   . Asthma Father   . Asthma Sister   . Hypertension Sister   . Asthma Brother   . Cancer Brother      Social History   Socioeconomic History  . Marital status: Single    Spouse name: Not on file  . Number of children: Not on file  . Years of education: Not on file  . Highest education level: Not on file  Occupational History  . Not on file  Tobacco Use  . Smoking status: Former Research scientist (life sciences)  . Smokeless tobacco: Never Used  . Tobacco comment: quit smoking in 2018    Vaping Use  . Vaping Use: Never used  Substance and Sexual Activity  . Alcohol use: Not Currently  . Drug use: Not Currently  . Sexual activity: Not on file  Other Topics Concern  . Not on file  Social History Narrative  . Not on file   Social Determinants of Health   Financial Resource Strain: Not on file  Food Insecurity: Not on file  Transportation Needs:  Not on file  Physical Activity: Not on file  Stress: Not on file  Social Connections: Not on file  Intimate Partner Violence: Not on file     No Known Allergies   Outpatient Medications Prior to Visit  Medication Sig Dispense Refill  . ibuprofen (ADVIL) 600 MG tablet TAKE 1 TABLET(600 MG) BY MOUTH EVERY 6 HOURS AS NEEDED 90 tablet 0  . loratadine (CLARITIN) 10 MG tablet TAKE 1 TABLET(10 MG)  BY MOUTH DAILY 30 tablet 3  . promethazine-dextromethorphan (PROMETHAZINE-DM) 6.25-15 MG/5ML syrup Take 5 mLs by mouth 4 (four) times daily as needed for cough. 240 mL 0  . albuterol (VENTOLIN HFA) 108 (90 Base) MCG/ACT inhaler Inhale 2 puffs into the lungs every 6 (six) hours as needed for wheezing or shortness of breath. 18 g 3  . amLODipine (NORVASC) 5 MG tablet TAKE 1 TABLET(5 MG) BY MOUTH DAILY 90 tablet 1  . gabapentin (NEURONTIN) 100 MG capsule Take 1 capsule (100 mg total) by mouth 3 (three) times daily. 90 capsule 3  . Glycopyrrolate-Formoterol (BEVESPI AEROSPHERE) 9-4.8 MCG/ACT AERO Inhale 2 puffs into the lungs in the morning and at bedtime. 10.7 g 6  . losartan (COZAAR) 100 MG tablet Take 1 tablet (100 mg total) by mouth daily. 90 tablet 2  . pantoprazole (PROTONIX) 40 MG tablet Take 1 tablet (40 mg total) by mouth 2 (two) times daily before a meal. 60 tablet 3  . Vitamin D, Ergocalciferol, (DRISDOL) 1.25 MG (50000 UNIT) CAPS capsule Take 1 capsule (50,000 Units total) by mouth every 7 (seven) days. 12 capsule 1  . Blood Pressure Monitoring (BLOOD PRESSURE MONITOR AUTOMAT) DEVI Measure blood pressure and pulse daily (Patient not taking: Reported on 09/12/2020) 1 Device 0  . Misc. Devices (PULSE OXIMETER FOR FINGER) MISC 1 application by Does not apply route daily as needed. 1 each 0  . Misc. Devices MISC Nebulizer machine, Large shower chair.  Diagnosis- chronic respiratory failure 1 each 0  . polyethylene glycol powder (GLYCOLAX/MIRALAX) 17 GM/SCOOP powder Take 17 g by mouth daily as needed.  (Patient not taking: No sig reported) 507 g 1  . Spacer/Aero-Holding Chambers (AEROCHAMBER MV) inhaler Use as instructed 1 each 0  . albuterol (PROVENTIL) (2.5 MG/3ML) 0.083% nebulizer solution Take 3 mLs (2.5 mg total) by nebulization 3 (three) times daily. 270 mL 1  . azithromycin (ZITHROMAX) 250 MG tablet Take two once then one daily until gone (Patient not taking: No sig reported) 6 tablet 0  . clonazePAM (KLONOPIN) 0.5 MG tablet TAKE 1 TABLET(0.5 MG) BY MOUTH DAILY AS NEEDED FOR ANXIETY (Patient not taking: Reported on 09/12/2020) 20 tablet 0  . Simethicone 125 MG CAPS Take 2 capsules (250 mg total) by mouth in the morning and at bedtime. (Patient not taking: Reported on 09/07/2020) 120 capsule 0   No facility-administered medications prior to visit.     Review of Systems  Constitutional: Positive for activity change and fatigue. Negative for fever.  HENT: Positive for rhinorrhea. Negative for ear pain.   Eyes: Negative.   Respiratory: Positive for cough, chest tightness and shortness of breath. Negative for hemoptysis, sputum production and wheezing.   Cardiovascular: Positive for leg swelling. Negative for chest pain, palpitations and PND.  Gastrointestinal: Negative for abdominal pain and vomiting.  Endocrine: Negative.   Genitourinary: Negative.   Musculoskeletal: Negative.   Allergic/Immunologic: Negative.   Neurological: Negative for headaches.  Hematological: Negative.   Psychiatric/Behavioral: Negative.        Objective:   Physical Exam This is a phone visit no exam done     BMP Latest Ref Rng & Units 11/04/2018 11/03/2018 10/28/2018  Glucose 70 - 99 mg/dL 184(H) 165(H) -  BUN 8 - 23 mg/dL 19 19 -  Creatinine 0.44 - 1.00 mg/dL 0.80 0.81 -  Sodium 135 - 145 mmol/L 138 135 139  Potassium 3.5 - 5.1 mmol/L 4.2 3.9 3.3(L)  Chloride 98 - 111 mmol/L 98 99 -  CO2 22 -  32 mmol/L 33(H) 26 -  Calcium 8.9 - 10.3 mg/dL 9.1 9.0 -   CBC Latest Ref Rng & Units 11/04/2018 10/28/2018  10/28/2018  WBC 4.0 - 10.5 K/uL 13.6(H) - 7.1  Hemoglobin 12.0 - 15.0 g/dL 10.8(L) 11.9(L) 11.9(L)  Hematocrit 36.0 - 46.0 % 35.1(L) 35.0(L) 38.5  Platelets 150 - 400 K/uL 308 - 343  CXR: 3/18 IMPRESSION: No acute abnormality seen. ABG  FIO2 1.0:    Component Value Date/Time   PHART 7.320 (L) 10/28/2018 0255   PCO2ART 62.8 (H) 10/28/2018 0255   PO2ART 196.0 (H) 10/28/2018 0255   HCO3 32.3 (H) 10/28/2018 0255   TCO2 34 (H) 10/28/2018 0255   O2SAT 100.0 10/28/2018 0255  Pulmonary functions have been obtained earlier in 2019 by her local pulmonologist in [facility].  This revealed an FEV1 of 25% predicted and diffusion capacity of 20% predicted.  This places the patient in the Gold stage D COPD category with significant advanced emphysema.     Assessment & Plan:  I personally reviewed all images and lab data in the [facility] system as well as any outside material available during this office visit and agree with the  radiology impressions.   Centrilobular emphysema ([place]) Patient instructed to increase the bevispi inhaler to 2 puffs twice a day  Chronic anxiety Plan is to increase Klonopin to 1 mg prior to each shower  We will attempt to obtain a proper fitting shower chair   Petal was seen today for follow-up.  Diagnoses and all orders for this visit:  Primary hypertension  Generalized anxiety disorder -     clonazePAM (KLONOPIN) 0.5 MG tablet; Take two tablets by mouth as needed for anxiety -     For home use only DME Other see comment  Mixed simple and mucopurulent chronic bronchitis ([place]) -     For home use only DME Other see comment  Chronic anxiety -     For home use only DME Other see comment  Centrilobular emphysema ([place]) -     For home use only DME Other see comment  Other orders -     Simethicone 125 MG CAPS; Take 2 capsules (250 mg total) by mouth in the morning and at bedtime. -     Glycopyrrolate-Formoterol (BEVESPI AEROSPHERE) 9-4.8 MCG/ACT AERO;  Inhale 2 puffs into the lungs in the morning and at bedtime. -     gabapentin (NEURONTIN) 100 MG capsule; Take 1 capsule (100 mg total) by mouth 3 (three) times daily. -     amLODipine (NORVASC) 5 MG tablet; TAKE 1 TABLET(5 MG) BY MOUTH DAILY -     albuterol (VENTOLIN HFA) 108 (90 Base) MCG/ACT inhaler; Inhale 2 puffs into the lungs every 6 (six) hours as needed for wheezing or shortness of breath. -     albuterol (PROVENTIL) (2.5 MG/3ML) 0.083% nebulizer solution; Take 3 mLs (2.5 mg total) by nebulization 3 (three) times daily. -     pantoprazole (PROTONIX) 40 MG tablet; Take 1 tablet (40 mg total) by mouth 2 (two) times daily before a meal. -     Vitamin D, Ergocalciferol, (DRISDOL) 1.25 MG (50000 UNIT) CAPS capsule; Take 1 capsule (50,000 Units total) by mouth every 7 (seven) days. -     losartan (COZAAR) 100 MG tablet; Take 1 tablet (100 mg total) by mouth daily.    Follow Up Instructions: The patient will be brought into the clinic for the flu shot and pneumonia vaccine in the next 2 to 3  weeks I discussed the assessment and treatment plan with the patient. The patient was provided an opportunity to ask questions and all were answered. The patient agreed with the plan and demonstrated an understanding of the instructions.   The patient was advised to call back or seek an in-person evaluation if the symptoms worsen or if the condition fails to improve as anticipated.  I provided 30 minutes of non-face-to-face time during this encounter  including  median intraservice time , review of notes, labs, imaging, medications  and explaining diagnosis and management to the patient .    Asencion Noble, MD

## 2020-09-12 ENCOUNTER — Other Ambulatory Visit: Payer: Self-pay

## 2020-09-12 ENCOUNTER — Encounter: Payer: Self-pay | Admitting: Critical Care Medicine

## 2020-09-12 ENCOUNTER — Ambulatory Visit: Payer: Medicare Other | Attending: Critical Care Medicine | Admitting: Critical Care Medicine

## 2020-09-12 DIAGNOSIS — J432 Centrilobular emphysema: Secondary | ICD-10-CM

## 2020-09-12 DIAGNOSIS — J418 Mixed simple and mucopurulent chronic bronchitis: Secondary | ICD-10-CM

## 2020-09-12 DIAGNOSIS — I10 Essential (primary) hypertension: Secondary | ICD-10-CM | POA: Diagnosis not present

## 2020-09-12 DIAGNOSIS — F411 Generalized anxiety disorder: Secondary | ICD-10-CM | POA: Insufficient documentation

## 2020-09-12 DIAGNOSIS — F419 Anxiety disorder, unspecified: Secondary | ICD-10-CM

## 2020-09-12 MED ORDER — PANTOPRAZOLE SODIUM 40 MG PO TBEC: 40.0000 mg | DELAYED_RELEASE_TABLET | Freq: Two times a day (BID) | ORAL | 3 refills | Status: DC

## 2020-09-12 MED ORDER — ALBUTEROL SULFATE HFA 108 (90 BASE) MCG/ACT IN AERS: 2.0000 | INHALATION_SPRAY | Freq: Four times a day (QID) | RESPIRATORY_TRACT | 3 refills | Status: DC | PRN

## 2020-09-12 MED ORDER — CLONAZEPAM 0.5 MG PO TABS: ORAL_TABLET | ORAL | 0 refills | Status: DC

## 2020-09-12 MED ORDER — LOSARTAN POTASSIUM 100 MG PO TABS
100.0000 mg | ORAL_TABLET | Freq: Every day | ORAL | 2 refills | Status: DC
Start: 2020-09-12 — End: 2020-11-14

## 2020-09-12 MED ORDER — AMLODIPINE BESYLATE 5 MG PO TABS: ORAL_TABLET | ORAL | 1 refills | Status: DC

## 2020-09-12 MED ORDER — BEVESPI AEROSPHERE 9-4.8 MCG/ACT IN AERO: 2.0000 | INHALATION_SPRAY | Freq: Two times a day (BID) | RESPIRATORY_TRACT | 6 refills | Status: DC

## 2020-09-12 MED ORDER — GABAPENTIN 100 MG PO CAPS: 100.0000 mg | ORAL_CAPSULE | Freq: Three times a day (TID) | ORAL | 3 refills | Status: DC

## 2020-09-12 MED ORDER — VITAMIN D (ERGOCALCIFEROL) 1.25 MG (50000 UNIT) PO CAPS: 50000.0000 [IU] | ORAL_CAPSULE | ORAL | 1 refills | Status: DC

## 2020-09-12 MED ORDER — SIMETHICONE 125 MG PO CAPS: 2.0000 | ORAL_CAPSULE | Freq: Two times a day (BID) | ORAL | 0 refills | Status: DC

## 2020-09-12 MED ORDER — ALBUTEROL SULFATE (2.5 MG/3ML) 0.083% IN NEBU: 2.5000 mg | INHALATION_SOLUTION | Freq: Three times a day (TID) | RESPIRATORY_TRACT | 1 refills | Status: DC

## 2020-09-12 NOTE — Progress Notes (Signed)
Discuss clonazepam- medication management

## 2020-09-12 NOTE — Assessment & Plan Note (Signed)
Plan is to increase Klonopin to 1 mg prior to each shower  We will attempt to obtain a proper fitting shower chair

## 2020-09-12 NOTE — Assessment & Plan Note (Signed)
Patient instructed to increase the bevispi inhaler to 2 puffs twice a day

## 2020-09-14 ENCOUNTER — Telehealth (HOSPITAL_COMMUNITY): Payer: Self-pay

## 2020-09-14 NOTE — Telephone Encounter (Signed)
Spoke to Bianca Murray who reports she is feeling okay but has a runny nose. She reports she is taking her medications properly in her bubble packs. Bianca Murray reports she was seen via telemedicine by Dr. Joya Gaskins on Monday and her Clonazepam was increased as patient requested.  Bianca Murray reports no needs at this time. I will see Bianca Murray next week in the home.

## 2020-09-22 ENCOUNTER — Telehealth (HOSPITAL_COMMUNITY): Payer: Self-pay

## 2020-09-22 DIAGNOSIS — F411 Generalized anxiety disorder: Secondary | ICD-10-CM

## 2020-09-22 MED ORDER — IBUPROFEN 600 MG PO TABS: ORAL_TABLET | ORAL | 0 refills | Status: DC

## 2020-09-22 MED ORDER — CLONAZEPAM 0.5 MG PO TABS: ORAL_TABLET | ORAL | 0 refills | Status: DC

## 2020-09-22 NOTE — Telephone Encounter (Signed)
So  I am confused  , does she need a new Rx for klonopin and motrin?

## 2020-09-22 NOTE — Addendum Note (Signed)
Addended by: Elsie Stain on: 09/22/2020 08:45 AM   Modules accepted: Orders

## 2020-09-22 NOTE — Telephone Encounter (Signed)
Spoke to [place] who reports she is feeling okay and has her medications in her bubble packs but is waiting on a few medicines from [employer]. She reports those are Motrin and Clonazepam and her inahlers. I spoke to Christy Sartorius who has her inhalers but RX of Motrin or new RX of Clonazepam was sent in. I will follow up with Dr. Joya Gaskins about same. [place] states her breathing has been okay, she did report an episode of her knee giving out today while walking. She feels like she needs a power chair to assist with her mobility around her home and in public due to her increased work of breathing while walking and weakness. She states she has looked at some to pay out of pocket but they are all too expensive. I will talk with Opal Sidles to see if we can get and order from the PCP and go through [facility]. Gila was made aware we will resume home visits next week. [name] agreed with plan. Call complete. I will follow up as needed and in home next week.

## 2020-09-23 NOTE — Telephone Encounter (Signed)
So I actually did a home visit last year   She lives with her daughter and her daughters  two adolescent children.  She has a downstairs bedroom   It is a tiny tiny house.  She is right next to the bathroom downstairs,  She eats in her bedroom and occasionally walks just a few feet to a couch in the living room.    There is no room in there for someone to ride around in a motorized wheelchair,  She never leaves the home  Short answer is no

## 2020-09-28 ENCOUNTER — Other Ambulatory Visit: Payer: Self-pay

## 2020-09-28 NOTE — Progress Notes (Signed)
Paramedicine Encounter    Patient ID: Bianca Murray, female    DOB: 1956-03-10, 65 y.o.   MRN: [id number]   Patient Care Team: Elsie Stain, MD as PCP - General (Pulmonary Disease)  Patient Active Problem List   Diagnosis Date Noted  . Chronic anxiety 09/12/2020  . Generalized anxiety disorder 09/12/2020  . GERD (gastroesophageal reflux disease) 03/22/2020  . Abdominal bloating 12/10/2019  . Hypertension 12/03/2018  . Centrilobular emphysema ([place]) 12/03/2018  . Vitamin D deficiency 11/27/2018  . Chronic respiratory failure with hypoxia and hypercapnia (HCC)   . COPD GOLD D (chronic obstructive pulmonary disease) ([place]) 10/28/2018    Current Outpatient Medications:  .  albuterol (PROVENTIL) (2.5 MG/3ML) 0.083% nebulizer solution, Take 3 mLs (2.5 mg total) by nebulization 3 (three) times daily., Disp: 270 mL, Rfl: 1 .  albuterol (VENTOLIN HFA) 108 (90 Base) MCG/ACT inhaler, Inhale 2 puffs into the lungs every 6 (six) hours as needed for wheezing or shortness of breath., Disp: 18 g, Rfl: 3 .  amLODipine (NORVASC) 5 MG tablet, TAKE 1 TABLET(5 MG) BY MOUTH DAILY, Disp: 90 tablet, Rfl: 1 .  Blood Pressure Monitoring (BLOOD PRESSURE MONITOR AUTOMAT) DEVI, Measure blood pressure and pulse daily (Patient not taking: Reported on 09/12/2020), Disp: 1 Device, Rfl: 0 .  clonazePAM (KLONOPIN) 0.5 MG tablet, Take two tablets by mouth as needed for anxiety, Disp: 20 tablet, Rfl: 0 .  gabapentin (NEURONTIN) 100 MG capsule, Take 1 capsule (100 mg total) by mouth 3 (three) times daily., Disp: 90 capsule, Rfl: 3 .  Glycopyrrolate-Formoterol (BEVESPI AEROSPHERE) 9-4.8 MCG/ACT AERO, Inhale 2 puffs into the lungs in the morning and at bedtime., Disp: 10.7 g, Rfl: 6 .  ibuprofen (ADVIL) 600 MG tablet, TAKE 1 TABLET(600 MG) BY MOUTH EVERY 6 HOURS AS NEEDED, Disp: 90 tablet, Rfl: 0 .  loratadine (CLARITIN) 10 MG tablet, TAKE 1 TABLET(10 MG) BY MOUTH DAILY, Disp: 30 tablet, Rfl: 3 .  losartan (COZAAR) 100 MG  tablet, Take 1 tablet (100 mg total) by mouth daily., Disp: 90 tablet, Rfl: 2 .  Misc. Devices (PULSE OXIMETER FOR FINGER) MISC, 1 application by Does not apply route daily as needed., Disp: 1 each, Rfl: 0 .  Misc. Devices MISC, Nebulizer machine, Large shower chair.  Diagnosis- chronic respiratory failure, Disp: 1 each, Rfl: 0 .  pantoprazole (PROTONIX) 40 MG tablet, Take 1 tablet (40 mg total) by mouth 2 (two) times daily before a meal., Disp: 60 tablet, Rfl: 3 .  polyethylene glycol powder (GLYCOLAX/MIRALAX) 17 GM/SCOOP powder, Take 17 g by mouth daily as needed. (Patient not taking: No sig reported), Disp: 507 g, Rfl: 1 .  promethazine-dextromethorphan (PROMETHAZINE-DM) 6.25-15 MG/5ML syrup, Take 5 mLs by mouth 4 (four) times daily as needed for cough., Disp: 240 mL, Rfl: 0 .  Simethicone 125 MG CAPS, Take 2 capsules (250 mg total) by mouth in the morning and at bedtime., Disp: 120 capsule, Rfl: 0 .  Spacer/Aero-Holding Chambers (AEROCHAMBER MV) inhaler, Use as instructed, Disp: 1 each, Rfl: 0 .  Vitamin D, Ergocalciferol, (DRISDOL) 1.25 MG (50000 UNIT) CAPS capsule, Take 1 capsule (50,000 Units total) by mouth every 7 (seven) days., Disp: 12 capsule, Rfl: 1 No Known Allergies   Social History   Socioeconomic History  . Marital status: Single    Spouse name: Not on file  . Number of children: Not on file  . Years of education: Not on file  . Highest education level: Not on file  Occupational History  . Not  on file  Tobacco Use  . Smoking status: Former Research scientist (life sciences)  . Smokeless tobacco: Never Used  . Tobacco comment: quit smoking in 2018    Vaping Use  . Vaping Use: Never used  Substance and Sexual Activity  . Alcohol use: Not Currently  . Drug use: Not Currently  . Sexual activity: Not on file  Other Topics Concern  . Not on file  Social History Narrative  . Not on file   Social Determinants of Health   Financial Resource Strain: Not on file  Food Insecurity: Not on file   Transportation Needs: Not on file  Physical Activity: Not on file  Stress: Not on file  Social Connections: Not on file  Intimate Partner Violence: Not on file    Physical Exam Vitals reviewed.  Constitutional:      Appearance: Normal appearance. She is normal weight.  HENT:     Head: Normocephalic.     Nose: Nose normal.     Mouth/Throat:     Mouth: Mucous membranes are moist.     Pharynx: Oropharynx is clear.  Eyes:     Pupils: Pupils are equal, round, and reactive to light.  Cardiovascular:     Rate and Rhythm: Normal rate and regular rhythm.     Pulses: Normal pulses.     Heart sounds: Normal heart sounds.  Pulmonary:     Effort: Respiratory distress present.     Breath sounds: Normal breath sounds.     Comments: Increased shortness of breath upon exertion.  Abdominal:     Palpations: Abdomen is soft.     Hernia: A hernia is present.  Musculoskeletal:        General: Normal range of motion.     Cervical back: Normal range of motion.  Skin:    General: Skin is warm and dry.     Capillary Refill: Capillary refill takes less than 2 seconds.  Neurological:     General: No focal deficit present.     Mental Status: She is alert. Mental status is at baseline.  Psychiatric:        Mood and Affect: Mood normal.     Arrived for home visit for [facility] who was alert and oriented with obvious respiratory distress while walking into the living room from her bedroom, once seated she was able to gain control of her respiratory effort. Lung sounds had a slight wheeze, Bianca Murray uses inhaler daily for same. She reports she hasn't used it yet today. Bianca Murray is compliant with bubble packs not needing any refills at this time. Vitals were obtained as noted. Bianca Murray was informed the denied order for a power chair so she reports her and her daughter seeking one to pay for out of pocket. I sent Bianca Murray a few suggested gently used power chairs in the area for her and her daughter to look at,  she was grateful for this. Bianca Murray was also interested in meals on wheels, I will look into this as she is not [facility] and would not be eligible for [employer] MEALS. Home visit completed. I plan to see patient in two weeks. Bianca Murray agreed with same.     Future Appointments  Date Time Provider [place]  10/12/2020  8:30 AM Elsie Stain, MD [facility] None     ACTION: Home visit completed Next visit planned for two weeks

## 2020-10-12 ENCOUNTER — Ambulatory Visit: Payer: Medicare Other | Admitting: Critical Care Medicine

## 2020-10-12 ENCOUNTER — Telehealth (HOSPITAL_COMMUNITY): Payer: Self-pay

## 2020-10-12 NOTE — Telephone Encounter (Signed)
Spoke to Bianca Murray who reports she is doing okay and needs to reschedule her appointment in which she canceled with Dr. Joya Gaskins. I contacted Bianca Murray to assist. Bianca Murray expressed an interest in a nutritionist and having PT at home. She asked if I could reach out to Dr. Joya Gaskins about these things, I sent message to Dr. Joya Gaskins awaiting reply. I plan to see Bianca Murray in one week in the home. Bianca Murray agreed. Bianca Murray reports she has all medications and had no needs at this time. Call complete.

## 2020-10-17 ENCOUNTER — Other Ambulatory Visit: Payer: Self-pay | Admitting: Physician Assistant

## 2020-10-17 DIAGNOSIS — J418 Mixed simple and mucopurulent chronic bronchitis: Secondary | ICD-10-CM

## 2020-10-18 ENCOUNTER — Telehealth: Payer: Self-pay | Admitting: Critical Care Medicine

## 2020-10-18 MED ORDER — AZELASTINE HCL 0.1 % NA SOLN: 2.0000 | Freq: Two times a day (BID) | NASAL | 12 refills | Status: DC

## 2020-10-18 NOTE — Telephone Encounter (Signed)
Patient aware. Verbalized understanding.

## 2020-10-18 NOTE — Telephone Encounter (Signed)
See attached note to Rx refill request.   Pt is requesting her dose in increased to twice a day for the Claritin instead of once a day.

## 2020-10-18 NOTE — Telephone Encounter (Signed)
Not a bid drug.  Will try astelin nasal spray   Rx sent to [facility]

## 2020-10-18 NOTE — Telephone Encounter (Signed)
Medication Refill - Medication: loratadine (CLARITIN) 10 MG tablet  Pt would like a refill but needs it to be increase to twice a day instead of once daily.  Has the patient contacted their pharmacy? Yes.   (Agent: If no, request that the patient contact the pharmacy for the refill.) (Agent: If yes, when and what did the pharmacy advise?)Call pcp   Preferred Pharmacy (with phone number or street name):  [place], [place] Phone:  [phone]  Fax:  [phone]       Agent: Please be advised that RX refills may take up to 3 business days. We ask that you follow-up with your pharmacy.

## 2020-10-19 ENCOUNTER — Other Ambulatory Visit: Payer: Self-pay

## 2020-10-19 NOTE — Progress Notes (Signed)
Paramedicine Encounter    Patient ID: Bianca Murray, female    DOB: 11-Aug-1956, 65 y.o.   MRN: [id number]   Patient Care Team: Elsie Stain, MD as PCP - General (Pulmonary Disease)  Patient Active Problem List   Diagnosis Date Noted  . Chronic anxiety 09/12/2020  . Generalized anxiety disorder 09/12/2020  . GERD (gastroesophageal reflux disease) 03/22/2020  . Abdominal bloating 12/10/2019  . Hypertension 12/03/2018  . Centrilobular emphysema ([place]) 12/03/2018  . Vitamin D deficiency 11/27/2018  . Chronic respiratory failure with hypoxia and hypercapnia (HCC)   . COPD GOLD D (chronic obstructive pulmonary disease) ([place]) 10/28/2018    Current Outpatient Medications:  .  albuterol (PROVENTIL) (2.5 MG/3ML) 0.083% nebulizer solution, Take 3 mLs (2.5 mg total) by nebulization 3 (three) times daily., Disp: 270 mL, Rfl: 1 .  albuterol (VENTOLIN HFA) 108 (90 Base) MCG/ACT inhaler, Inhale 2 puffs into the lungs every 6 (six) hours as needed for wheezing or shortness of breath., Disp: 18 g, Rfl: 3 .  amLODipine (NORVASC) 5 MG tablet, TAKE 1 TABLET(5 MG) BY MOUTH DAILY, Disp: 90 tablet, Rfl: 1 .  azelastine (ASTELIN) 0.1 % nasal spray, Place 2 sprays into both nostrils 2 (two) times daily. Use in each nostril as directed, Disp: 30 mL, Rfl: 12 .  Blood Pressure Monitoring (BLOOD PRESSURE MONITOR AUTOMAT) DEVI, Measure blood pressure and pulse daily (Patient not taking: Reported on 09/12/2020), Disp: 1 Device, Rfl: 0 .  clonazePAM (KLONOPIN) 0.5 MG tablet, Take two tablets by mouth as needed for anxiety, Disp: 20 tablet, Rfl: 0 .  gabapentin (NEURONTIN) 100 MG capsule, Take 1 capsule (100 mg total) by mouth 3 (three) times daily., Disp: 90 capsule, Rfl: 3 .  Glycopyrrolate-Formoterol (BEVESPI AEROSPHERE) 9-4.8 MCG/ACT AERO, Inhale 2 puffs into the lungs in the morning and at bedtime., Disp: 10.7 g, Rfl: 6 .  ibuprofen (ADVIL) 600 MG tablet, TAKE 1 TABLET(600 MG) BY MOUTH EVERY 6 HOURS AS NEEDED,  Disp: 90 tablet, Rfl: 0 .  loratadine (CLARITIN) 10 MG tablet, TAKE 1 TABLET(10 MG) BY MOUTH DAILY, Disp: 30 tablet, Rfl: 3 .  losartan (COZAAR) 100 MG tablet, Take 1 tablet (100 mg total) by mouth daily., Disp: 90 tablet, Rfl: 2 .  Misc. Devices (PULSE OXIMETER FOR FINGER) MISC, 1 application by Does not apply route daily as needed., Disp: 1 each, Rfl: 0 .  Misc. Devices MISC, Nebulizer machine, Large shower chair.  Diagnosis- chronic respiratory failure, Disp: 1 each, Rfl: 0 .  pantoprazole (PROTONIX) 40 MG tablet, Take 1 tablet (40 mg total) by mouth 2 (two) times daily before a meal., Disp: 60 tablet, Rfl: 3 .  polyethylene glycol powder (GLYCOLAX/MIRALAX) 17 GM/SCOOP powder, Take 17 g by mouth daily as needed. (Patient not taking: No sig reported), Disp: 507 g, Rfl: 1 .  promethazine-dextromethorphan (PROMETHAZINE-DM) 6.25-15 MG/5ML syrup, TAKE 5 MLS BY MOUTH 4 (FOUR) TIMES DAILY AS NEEDED FOR COUGH., Disp: 240 mL, Rfl: 0 .  Simethicone 125 MG CAPS, Take 2 capsules (250 mg total) by mouth in the morning and at bedtime., Disp: 120 capsule, Rfl: 0 .  Spacer/Aero-Holding Chambers (AEROCHAMBER MV) inhaler, Use as instructed, Disp: 1 each, Rfl: 0 .  Vitamin D, Ergocalciferol, (DRISDOL) 1.25 MG (50000 UNIT) CAPS capsule, Take 1 capsule (50,000 Units total) by mouth every 7 (seven) days., Disp: 12 capsule, Rfl: 1 No Known Allergies   Social History   Socioeconomic History  . Marital status: Single    Spouse name: Not on file  .  Number of children: Not on file  . Years of education: Not on file  . Highest education level: Not on file  Occupational History  . Not on file  Tobacco Use  . Smoking status: Former Research scientist (life sciences)  . Smokeless tobacco: Never Used  . Tobacco comment: quit smoking in 2018    Vaping Use  . Vaping Use: Never used  Substance and Sexual Activity  . Alcohol use: Not Currently  . Drug use: Not Currently  . Sexual activity: Not on file  Other Topics Concern  . Not on file   Social History Narrative  . Not on file   Social Determinants of Health   Financial Resource Strain: Not on file  Food Insecurity: Not on file  Transportation Needs: Not on file  Physical Activity: Not on file  Stress: Not on file  Social Connections: Not on file  Intimate Partner Violence: Not on file    Physical Exam Vitals reviewed.  Constitutional:      Appearance: She is normal weight.  HENT:     Head: Normocephalic.     Nose: Congestion and rhinorrhea present.     Mouth/Throat:     Mouth: Mucous membranes are moist.     Pharynx: Oropharynx is clear.  Eyes:     Pupils: Pupils are equal, round, and reactive to light.  Cardiovascular:     Rate and Rhythm: Normal rate and regular rhythm.     Pulses: Normal pulses.     Heart sounds: Normal heart sounds.  Pulmonary:     Effort: Pulmonary effort is normal.     Breath sounds: Normal breath sounds.  Abdominal:     General: There is distension.  Musculoskeletal:        General: Normal range of motion.  Skin:    General: Skin is warm and dry.     Capillary Refill: Capillary refill takes less than 2 seconds.  Neurological:     General: No focal deficit present.     Mental Status: She is alert. Mental status is at baseline.  Psychiatric:        Mood and Affect: Mood normal.     Arrived for home visit for Bianca Murray who reports she is feeling okay but is having some issues with his allergies. Bianca Murray reports she has been having watery eyes for a few days. Bianca Murray says this could be because of her pet rabbit's cage being right by her bedside. I encouraged her to consider moving her rabbit's cage and it's hay and food out of her room to see if it helps. Bianca Murray agreed with plan. She also reports she is taking the nasal spray Bianca Murray sent it but she says it makes her eyes water more. Bianca Murray said she would continue to try it. I reviewed bubble packs and confirmed same. Bianca Murray was made aware of upcoming appointments and she  agreed with same, she also agreed to call [facility] for her GI visit. Bianca Murray was inquiring about a medical alert necklace. I will assist in sending her daughter some ideas for medical alert systems. Home visit completed I will see Bianca Murray in 2 to 4 weeks.  Refills NONE    Future Appointments  Date Time Provider [place]  11/14/2020  9:30 AM Elsie Stain, MD [facility] None     ACTION: Home visit completed Next visit planned for 2-4 weeks

## 2020-11-03 ENCOUNTER — Other Ambulatory Visit: Payer: Self-pay

## 2020-11-03 ENCOUNTER — Encounter: Payer: Self-pay | Admitting: Nurse Practitioner

## 2020-11-03 NOTE — Progress Notes (Signed)
Paramedicine Encounter    Patient ID: Bianca Murray, female    DOB: 11-Aug-1956, 65 y.o.   MRN: [id number]   Patient Care Team: Elsie Stain, MD as PCP - General (Pulmonary Disease)  Patient Active Problem List   Diagnosis Date Noted  . Chronic anxiety 09/12/2020  . Generalized anxiety disorder 09/12/2020  . GERD (gastroesophageal reflux disease) 03/22/2020  . Abdominal bloating 12/10/2019  . Hypertension 12/03/2018  . Centrilobular emphysema ([place]) 12/03/2018  . Vitamin D deficiency 11/27/2018  . Chronic respiratory failure with hypoxia and hypercapnia (HCC)   . COPD GOLD D (chronic obstructive pulmonary disease) ([place]) 10/28/2018    Current Outpatient Medications:  .  albuterol (PROVENTIL) (2.5 MG/3ML) 0.083% nebulizer solution, Take 3 mLs (2.5 mg total) by nebulization 3 (three) times daily., Disp: 270 mL, Rfl: 1 .  albuterol (VENTOLIN HFA) 108 (90 Base) MCG/ACT inhaler, Inhale 2 puffs into the lungs every 6 (six) hours as needed for wheezing or shortness of breath., Disp: 18 g, Rfl: 3 .  amLODipine (NORVASC) 5 MG tablet, TAKE 1 TABLET(5 MG) BY MOUTH DAILY, Disp: 90 tablet, Rfl: 1 .  azelastine (ASTELIN) 0.1 % nasal spray, Place 2 sprays into both nostrils 2 (two) times daily. Use in each nostril as directed, Disp: 30 mL, Rfl: 12 .  Blood Pressure Monitoring (BLOOD PRESSURE MONITOR AUTOMAT) DEVI, Measure blood pressure and pulse daily (Patient not taking: Reported on 09/12/2020), Disp: 1 Device, Rfl: 0 .  clonazePAM (KLONOPIN) 0.5 MG tablet, Take two tablets by mouth as needed for anxiety, Disp: 20 tablet, Rfl: 0 .  gabapentin (NEURONTIN) 100 MG capsule, Take 1 capsule (100 mg total) by mouth 3 (three) times daily., Disp: 90 capsule, Rfl: 3 .  Glycopyrrolate-Formoterol (BEVESPI AEROSPHERE) 9-4.8 MCG/ACT AERO, Inhale 2 puffs into the lungs in the morning and at bedtime., Disp: 10.7 g, Rfl: 6 .  ibuprofen (ADVIL) 600 MG tablet, TAKE 1 TABLET(600 MG) BY MOUTH EVERY 6 HOURS AS NEEDED,  Disp: 90 tablet, Rfl: 0 .  loratadine (CLARITIN) 10 MG tablet, TAKE 1 TABLET(10 MG) BY MOUTH DAILY, Disp: 30 tablet, Rfl: 3 .  losartan (COZAAR) 100 MG tablet, Take 1 tablet (100 mg total) by mouth daily., Disp: 90 tablet, Rfl: 2 .  Misc. Devices (PULSE OXIMETER FOR FINGER) MISC, 1 application by Does not apply route daily as needed., Disp: 1 each, Rfl: 0 .  Misc. Devices MISC, Nebulizer machine, Large shower chair.  Diagnosis- chronic respiratory failure, Disp: 1 each, Rfl: 0 .  pantoprazole (PROTONIX) 40 MG tablet, Take 1 tablet (40 mg total) by mouth 2 (two) times daily before a meal., Disp: 60 tablet, Rfl: 3 .  polyethylene glycol powder (GLYCOLAX/MIRALAX) 17 GM/SCOOP powder, Take 17 g by mouth daily as needed. (Patient not taking: No sig reported), Disp: 507 g, Rfl: 1 .  promethazine-dextromethorphan (PROMETHAZINE-DM) 6.25-15 MG/5ML syrup, TAKE 5 MLS BY MOUTH 4 (FOUR) TIMES DAILY AS NEEDED FOR COUGH., Disp: 240 mL, Rfl: 0 .  Simethicone 125 MG CAPS, Take 2 capsules (250 mg total) by mouth in the morning and at bedtime., Disp: 120 capsule, Rfl: 0 .  Spacer/Aero-Holding Chambers (AEROCHAMBER MV) inhaler, Use as instructed, Disp: 1 each, Rfl: 0 .  Vitamin D, Ergocalciferol, (DRISDOL) 1.25 MG (50000 UNIT) CAPS capsule, Take 1 capsule (50,000 Units total) by mouth every 7 (seven) days., Disp: 12 capsule, Rfl: 1 No Known Allergies   Social History   Socioeconomic History  . Marital status: Single    Spouse name: Not on file  .  Number of children: Not on file  . Years of education: Not on file  . Highest education level: Not on file  Occupational History  . Not on file  Tobacco Use  . Smoking status: Former Research scientist (life sciences)  . Smokeless tobacco: Never Used  . Tobacco comment: quit smoking in 2018    Vaping Use  . Vaping Use: Never used  Substance and Sexual Activity  . Alcohol use: Not Currently  . Drug use: Not Currently  . Sexual activity: Not on file  Other Topics Concern  . Not on file   Social History Narrative  . Not on file   Social Determinants of Health   Financial Resource Strain: Not on file  Food Insecurity: Not on file  Transportation Needs: Not on file  Physical Activity: Not on file  Stress: Not on file  Social Connections: Not on file  Intimate Partner Violence: Not on file    Physical Exam Vitals reviewed.  Constitutional:      Appearance: Normal appearance. She is normal weight.  HENT:     Head: Normocephalic.     Nose: Nose normal.     Mouth/Throat:     Mouth: Mucous membranes are moist.     Pharynx: Oropharynx is clear.  Eyes:     Conjunctiva/sclera: Conjunctivae normal.     Pupils: Pupils are equal, round, and reactive to light.  Cardiovascular:     Rate and Rhythm: Normal rate and regular rhythm.     Pulses: Normal pulses.     Heart sounds: Normal heart sounds.  Pulmonary:     Effort: Pulmonary effort is normal.     Breath sounds: Normal breath sounds.  Abdominal:     General: Abdomen is flat.     Palpations: Abdomen is soft.  Musculoskeletal:        General: Normal range of motion.     Cervical back: Normal range of motion.     Right lower leg: No edema.     Left lower leg: No edema.  Skin:    General: Skin is warm and dry.     Capillary Refill: Capillary refill takes less than 2 seconds.  Neurological:     General: No focal deficit present.     Mental Status: She is alert. Mental status is at baseline.  Psychiatric:        Mood and Affect: Mood normal.     Arrived for home visit for [facility] who was alert and oriented reporting to be feeling fine with no complaints today. Marilou has been compliant with her medications and has been utilizing her bubble packs properly. She is currently on her last week and I ordered her next set from Christy Sartorius at [employer] today at 1230. Vitals were obtained and are as noted. [name] scheduled her appointment with [facility] for April 13th to assess her hernia. [name] and I reviewed  upcoming appointments and she agreed with same.  Lesette agreed with follow up in 2-4 weeks.   I will talk with Dr. Joya Gaskins about plans for care plan now that we have appointments and medications managed.   Home visit complete.     Future Appointments  Date Time Provider [place]  11/14/2020  9:30 AM Elsie Stain, MD [facility] None  11/23/2020  9:30 AM Noralyn Pick, NP [facility]     ACTION: Home visit completed Next visit planned for two-four weeks

## 2020-11-09 ENCOUNTER — Other Ambulatory Visit: Payer: Self-pay | Admitting: Critical Care Medicine

## 2020-11-09 MED ORDER — IBUPROFEN 600 MG PO TABS: ORAL_TABLET | ORAL | 0 refills | Status: DC

## 2020-11-09 NOTE — Progress Notes (Signed)
Ibuprofen sent

## 2020-11-10 ENCOUNTER — Telehealth (HOSPITAL_COMMUNITY): Payer: Self-pay

## 2020-11-10 NOTE — Telephone Encounter (Signed)
Received a call from Bianca Murray on 3/30 around 1800 explaining she was depressed and tired, with feelings of wanting to "give up" sometimes. We spoke deeply about her situations of depression and by the end of the conversation she reported to feel better talking about it. Bianca Murray says she is wanting some resources for a therapist or a Education officer, museum to talk with about these feelings and thoughts. I advised her I would reach out to obtain the correct list of resources. She has an in person visit with Dr. Joya Gaskins coming up next week. I will continue to check in with her.

## 2020-11-13 NOTE — Progress Notes (Signed)
Subjective:  History of Present Illness:   Patient ID: Bianca Murray, female    DOB: Mar 28, 1956, 65 y.o.   MRN: [id number] 11/27/18 This is a 65 year old female with advanced chronic obstructive lung disease.  Pulmonary functions have been obtained earlier in 2019 by her local pulmonologist in [facility].  This revealed an FEV1 of 25% predicted diffusion capacity of 20% predicted.  This places the patient in the Gold stage D COPD category with significant advanced emphysema. The patient did remove recently from her home in [place] to be closer to her daughter and is now staying in her daughter's home in this move occurred in January 2020.  Since that time the patient began having viral symptoms of an upper respiratory tract infection and was admitted to the hospital on March 17 being discharged on the 25th.  Below are excerpts from the discharge summary  Admit date: 10/28/2018 Discharge date: 11/05/2018  Discharge Diagnosis:  Principal Problem:   Acute on chronic respiratory failure with hypoxia and hypercapnia (HCC) Active Problems:   COPD with acute exacerbation (HCC)   COPD (chronic obstructive pulmonary disease) (HCC)   Chronic respiratory failure with hypoxia and hypercapnia (HCC)   Hyperglycemia  History of Present Illness:  [name] a 65 y.o.femalewith medical history significant ofO2 dependent COPD, Asthma. She has required intubation and tracheostomy in the past (though this has been reversed). She moved down to the area a few weeks ago from [place]. No known sick contacts. For the past 4-5 days she has had increased SOB, cough, wheezing. This has been gradually worsening since onset. She is on 3-4L O2 at baseline.   Hospital Course by Problem:  Acute on chronic hypoxic and hypercapnic respiratory failure Secondary to COPD.Slight improvement today.Sats 96%on 3L.Explained to patient needto keep sat between 89-92%.Patientcontinues to feel  anxietyand reports worsening sob when oxygen weaned. Improved air movement with less wheeze today -will weansolumedrol from 60mg  every 6hours to evey 8 hours -continue towean oxygen -may need  COPD exacerbation Chestx-ray with no acute abnormality. Hx of ESLD has been evaluated to lung transplant.Patient was unable to tolerate BiPAP.notes from pulmonology reveal most recent visit 01/2017. PFT at that time with showed severe obstructive ventilatory defect, air trapping and mildly reduced diffusion capacity - continuenebs with Brovana and Pulmicort  -will need OP pulm referral  Essential hypertension -resume home meds  Normocytic anemia Hemoglobin stable, outpatient follow-up recommended.  Hyperglycemia: likely related to steroids -carb mod diet  The patient is seen in follow-up today and note has had her oxygen system switch back to [place].  She now has an inogen portable oxygen concentrator.  She runs 3 L continuous.  Her saturations remained in the mid 90s on this.  She states she is still dyspneic with minimal exertion.  She was on Anoro but it was not covered by her insurance and we switched her to Spiriva she is not doing as well.  She states mucus gets stuck in her throat.  There is no chest pain.  She is sleeping well.  08/19/2019 Last seen 01/2019 Pt with more dyspnea, cough, no chest pain.  Notes more phlegm is present.  Needs a pulse ox. BP ok    On 4L oxygen.   Cannot tolerate prednisone or inhaled steroid.  Gold D Copd stage D  09/15/2019 Since the last visit the patient's cough has returned.  She is coughing up more thick mucus.  She has wheezing but no fever or chest pain.  She is having  more dyspnea.  She states the antibiotic at the last visit was beneficial.  She is maintaining the Anoro and the albuterol nebulizer  12/10/2019 Video visit  Patient is seen in follow-up and states that her cough is markedly improved however she complains of abdominal  discomfort increased gas in the stomach early satiety and also constipation he states the cough medicine is useful and would like a refill she maintains the Anoro 1 puff a day she is yet to receive her Covid vaccine.  She notes no belching but has panic attacks at times if she has to get ready in the morning.  She has no real reflux symptoms at this time.  She is now living with her daughter in a new location.   03/10/2020 This is a video visit with this patient has advanced chronic obstructive lung disease. She states her shortness of breath is getting worse. He is coughing and wheezing more and having more mucus that is difficult to raise. She is on the lisinopril and states the back of her throat is irritated. She has had to increase her oxygen up to 4 L. She is on her new inhalers and is using a spacer device. I went over the proper use of the spacer device on the video with the patient. The patient is essentially homebound. She did receive her Covid vaccine still homebound vaccine program.  03/22/2020 This visit is a housecall and that the patient is now homebound and not able to come to the office for examinations and also due to the fact that the Covid viral infection has spiked again in the county and is not safe for the patient to leave her home due to her severe lung disease   Patient states she continues to have shortness of breath and cough.  The cough is worse at night when she tries to sleep.  She has nasal congestion postnasal drainage.  She rattles in the chest.  She has taken prednisone in the past but she has adverse side effects from this and cannot tolerate this.  The patient states she is having excess gas belching burping and gas from below with abdominal distention.  She is not having bowel movements regularly.  She does maintain oxygen 4 L continuous at this time.  She is maintaining the Bevespi inhaler 2 puffs twice daily with an AeroChamber.  Patient does have a prescription for  Promethazine DM but takes it rarely.  She states Gas-X was of no benefit to her.  She states Senokot also did not help her as well.  Patient does maintain losartan 100 mg daily amlodipine 5 mg daily for hypertension.  She denies headache chest pain or other cardiovascular complaints at this time.  She does maintain vitamin D weekly for severe vitamin D deficiency  06/27/2020 This patient was seen by way of a video visit today.  She has been noting increased abdominal bloating and increased wheezing cough some degree of shortness of breath She has increased rhinorrhea.  She will need a Pneumovax and also flu shot as well as Covid booster shot She has a home blood pressure monitor her blood pressure has been running 142/79 in the home  1/31: Patient is seen in return visit today by way of a telephone visit.  Her largest complaint is severe anxiety when she is showering.  She takes 2.5 mg Klonopin only before shower and states this is not sufficient for her.  She also states she is only taking the inhaler once  a day but it is prescribed twice a day.  Patient denies any chest pain increased mucus or other complaints at this time.  Patient is interested in getting a shower chair that may fit her bathtub more effectively.  Patient is due a flu shot and Pneumovax and she is already received her Covid booster  11/14/20 This patient states her shortness of breath is progressively getting worse she is maintaining inhaled medications.  The patient also complains of abdominal distention early satiety gaseous bloating.  She has significant depression and has is seeking some type of mental health provider for counseling.  She is not been using simethicone as prescribed.  She does not have a functional flutter valve at this time.  Patient is due a Pneumovax Prevnar 13 Patient is using her portable indigent oxygen system.  Shortness of Breath This is a chronic problem. The current episode started more than 1  year ago. The problem occurs daily. The problem has been unchanged. Associated symptoms include leg swelling and rhinorrhea. Pertinent negatives include no abdominal pain, chest pain, ear pain, fever, headaches, hemoptysis, leg pain, PND, sputum production, vomiting or wheezing. The symptoms are aggravated by lying flat and exercise. She has tried beta agonist inhalers, ipratropium inhalers and oral steroids for the symptoms. The treatment provided significant relief. Her past medical history is significant for COPD.    Past Medical History:  Diagnosis Date  . Arthritis   . Asthma   . Cancer ([place])   . Chronic respiratory failure with hypoxia and hypercapnia (HCC)   . COPD (chronic obstructive pulmonary disease) ([place])   . Hypertension      Family History  Problem Relation Age of Onset  . Hypertension Mother   . Heart disease Mother   . Cancer Mother   . Asthma Father   . Asthma Sister   . Hypertension Sister   . Asthma Brother   . Cancer Brother      Social History   Socioeconomic History  . Marital status: Single    Spouse name: Not on file  . Number of children: Not on file  . Years of education: Not on file  . Highest education level: Not on file  Occupational History  . Not on file  Tobacco Use  . Smoking status: Former Research scientist (life sciences)  . Smokeless tobacco: Never Used  . Tobacco comment: quit smoking in 2018    Vaping Use  . Vaping Use: Never used  Substance and Sexual Activity  . Alcohol use: Not Currently  . Drug use: Not Currently  . Sexual activity: Not on file  Other Topics Concern  . Not on file  Social History Narrative  . Not on file   Social Determinants of Health   Financial Resource Strain: Not on file  Food Insecurity: Not on file  Transportation Needs: Not on file  Physical Activity: Not on file  Stress: Not on file  Social Connections: Not on file  Intimate Partner Violence: Not on file     No Known Allergies   Outpatient Medications Prior to  Visit  Medication Sig Dispense Refill  . albuterol (VENTOLIN HFA) 108 (90 Base) MCG/ACT inhaler Inhale 2 puffs into the lungs every 6 (six) hours as needed for wheezing or shortness of breath. 18 g 3  . azelastine (ASTELIN) 0.1 % nasal spray Place 2 sprays into both nostrils 2 (two) times daily. Use in each nostril as directed 30 mL 12  . Blood Pressure Monitoring (BLOOD PRESSURE MONITOR AUTOMAT) DEVI  Measure blood pressure and pulse daily 1 Device 0  . clonazePAM (KLONOPIN) 0.5 MG tablet Take two tablets by mouth as needed for anxiety 20 tablet 0  . Glycopyrrolate-Formoterol (BEVESPI AEROSPHERE) 9-4.8 MCG/ACT AERO Inhale 2 puffs into the lungs in the morning and at bedtime. 10.7 g 6  . ibuprofen (ADVIL) 600 MG tablet TAKE 1 TABLET(600 MG) BY MOUTH EVERY 6 HOURS AS NEEDED 90 tablet 0  . loratadine (CLARITIN) 10 MG tablet TAKE 1 TABLET(10 MG) BY MOUTH DAILY 30 tablet 3  . Misc. Devices (PULSE OXIMETER FOR FINGER) MISC 1 application by Does not apply route daily as needed. 1 each 0  . Misc. Devices MISC Nebulizer machine, Large shower chair.  Diagnosis- chronic respiratory failure 1 each 0  . promethazine-dextromethorphan (PROMETHAZINE-DM) 6.25-15 MG/5ML syrup TAKE 5 MLS BY MOUTH 4 (FOUR) TIMES DAILY AS NEEDED FOR COUGH. 240 mL 0  . Spacer/Aero-Holding Chambers (AEROCHAMBER MV) inhaler Use as instructed 1 each 0  . amLODipine (NORVASC) 5 MG tablet TAKE 1 TABLET(5 MG) BY MOUTH DAILY 90 tablet 1  . gabapentin (NEURONTIN) 100 MG capsule Take 1 capsule (100 mg total) by mouth 3 (three) times daily. 90 capsule 3  . losartan (COZAAR) 100 MG tablet Take 1 tablet (100 mg total) by mouth daily. 90 tablet 2  . pantoprazole (PROTONIX) 40 MG tablet Take 1 tablet (40 mg total) by mouth 2 (two) times daily before a meal. 60 tablet 3  . polyethylene glycol powder (GLYCOLAX/MIRALAX) 17 GM/SCOOP powder Take 17 g by mouth daily as needed. 507 g 1  . Vitamin D, Ergocalciferol, (DRISDOL) 1.25 MG (50000 UNIT) CAPS  capsule Take 1 capsule (50,000 Units total) by mouth every 7 (seven) days. 12 capsule 1  . albuterol (PROVENTIL) (2.5 MG/3ML) 0.083% nebulizer solution Take 3 mLs (2.5 mg total) by nebulization 3 (three) times daily. 270 mL 1  . Simethicone 125 MG CAPS Take 2 capsules (250 mg total) by mouth in the morning and at bedtime. 120 capsule 0   No facility-administered medications prior to visit.     Review of Systems  Constitutional: Positive for activity change and fatigue. Negative for fever.  HENT: Positive for rhinorrhea. Negative for ear pain.   Eyes: Negative.   Respiratory: Positive for cough, chest tightness and shortness of breath. Negative for hemoptysis, sputum production and wheezing.   Cardiovascular: Positive for leg swelling. Negative for chest pain, palpitations and PND.  Gastrointestinal: Negative.  Negative for abdominal pain and vomiting.  Endocrine: Negative.   Genitourinary: Negative.   Musculoskeletal: Negative.   Skin: Negative.   Allergic/Immunologic: Negative.   Neurological: Negative for headaches.  Hematological: Negative.   Psychiatric/Behavioral: Negative.        Objective:   Physical Exam Vitals:   11/14/20 0934  BP: 136/82  Pulse: (!) 109  SpO2: 94%  Weight: 220 lb 3.2 oz (99.9 kg)  Height: 5\' 3"  (1.6 m)    Gen: Pleasant, well-nourished, in no distress,  normal affect  ENT: No lesions,  mouth clear,  oropharynx clear, no postnasal drip  Neck: No JVD, no TMG, no carotid bruits  Lungs: No use of accessory muscles, no dullness to percussion, distant breath sounds hyperresonance to percussion   Cardiovascular: RRR, heart sounds normal, no murmur or gallops, no peripheral edema  Abdomen: soft and NT, no HSM,  BS normal  Musculoskeletal: No deformities, no cyanosis or clubbing  Neuro: alert, non focal  Skin: Warm, no lesions or rashes  No results found.  BMP Latest Ref Rng & Units 11/04/2018 11/03/2018 10/28/2018  Glucose 70 - 99 mg/dL  184(H) 165(H) -  BUN 8 - 23 mg/dL 19 19 -  Creatinine 0.44 - 1.00 mg/dL 0.80 0.81 -  Sodium 135 - 145 mmol/L 138 135 139  Potassium 3.5 - 5.1 mmol/L 4.2 3.9 3.3(L)  Chloride 98 - 111 mmol/L 98 99 -  CO2 22 - 32 mmol/L 33(H) 26 -  Calcium 8.9 - 10.3 mg/dL 9.1 9.0 -   CBC Latest Ref Rng & Units 11/04/2018 10/28/2018 10/28/2018  WBC 4.0 - 10.5 K/uL 13.6(H) - 7.1  Hemoglobin 12.0 - 15.0 g/dL 10.8(L) 11.9(L) 11.9(L)  Hematocrit 36.0 - 46.0 % 35.1(L) 35.0(L) 38.5  Platelets 150 - 400 K/uL 308 - 343  CXR: 3/18 IMPRESSION: No acute abnormality seen. ABG  FIO2 1.0:    Component Value Date/Time   PHART 7.320 (L) 10/28/2018 0255   PCO2ART 62.8 (H) 10/28/2018 0255   PO2ART 196.0 (H) 10/28/2018 0255   HCO3 32.3 (H) 10/28/2018 0255   TCO2 34 (H) 10/28/2018 0255   O2SAT 100.0 10/28/2018 0255  Pulmonary functions have been obtained earlier in 2019 by her local pulmonologist in [facility].  This revealed an FEV1 of 25% predicted and diffusion capacity of 20% predicted.  This places the patient in the Gold stage D COPD category with significant advanced emphysema.     Assessment & Plan:  I personally reviewed all images and lab data in the [facility] system as well as any outside material available during this office visit and agree with the  radiology impressions.   Chronic respiratory failure with hypoxia and hypercapnia (HCC) Chronic hypoxic respiratory failure continue oxygen therapy  COPD GOLD D (chronic obstructive pulmonary disease) (HCC) Continue to inhaled medications  Centrilobular emphysema (HCC) Flutter valve given  Hypertension At goal no changes  Abdominal bloating Chronic abdominal bloating continue simethicone eat in smaller amounts more frequently  Generalized anxiety disorder Chronic anxiety and depression referral to behavioral therapy given  Vitamin D deficiency Continue vitamin D replacement   Leo was seen today for follow-up.  Diagnoses and all orders  for this visit:  Chronic respiratory failure with hypoxia and hypercapnia (HCC)  Mixed simple and mucopurulent chronic bronchitis (HCC)  Centrilobular emphysema (HCC)  Primary hypertension  Abdominal bloating  Generalized anxiety disorder  Vitamin D deficiency  Other orders -     albuterol (PROVENTIL) (2.5 MG/3ML) 0.083% nebulizer solution; Take 3 mLs (2.5 mg total) by nebulization 3 (three) times daily. -     amLODipine (NORVASC) 5 MG tablet; TAKE 1 TABLET(5 MG) BY MOUTH DAILY -     gabapentin (NEURONTIN) 100 MG capsule; Take 1 capsule (100 mg total) by mouth 3 (three) times daily. -     losartan (COZAAR) 100 MG tablet; Take 1 tablet (100 mg total) by mouth daily. -     pantoprazole (PROTONIX) 40 MG tablet; Take 1 tablet (40 mg total) by mouth 2 (two) times daily before a meal. -     polyethylene glycol powder (GLYCOLAX/MIRALAX) 17 GM/SCOOP powder; Take 17 g by mouth daily as needed. -     Simethicone 125 MG CAPS; Take 2 capsules (250 mg total) by mouth 3 (three) times daily with meals. -     Vitamin D, Ergocalciferol, (DRISDOL) 1.25 MG (50000 UNIT) CAPS capsule; Take 1 capsule (50,000 Units total) by mouth every 7 (seven) days. -     Pneumococcal conjugate vaccine 13-valent IM

## 2020-11-14 ENCOUNTER — Ambulatory Visit: Payer: Medicare Other | Attending: Critical Care Medicine | Admitting: Critical Care Medicine

## 2020-11-14 ENCOUNTER — Encounter: Payer: Self-pay | Admitting: Critical Care Medicine

## 2020-11-14 ENCOUNTER — Other Ambulatory Visit: Payer: Self-pay

## 2020-11-14 VITALS — BP 136/82 | HR 109 | Ht 63.0 in | Wt 220.2 lb

## 2020-11-14 DIAGNOSIS — F411 Generalized anxiety disorder: Secondary | ICD-10-CM | POA: Diagnosis not present

## 2020-11-14 DIAGNOSIS — Z23 Encounter for immunization: Secondary | ICD-10-CM | POA: Diagnosis not present

## 2020-11-14 DIAGNOSIS — R140 Abdominal distension (gaseous): Secondary | ICD-10-CM | POA: Insufficient documentation

## 2020-11-14 DIAGNOSIS — F32A Depression, unspecified: Secondary | ICD-10-CM | POA: Diagnosis not present

## 2020-11-14 DIAGNOSIS — E559 Vitamin D deficiency, unspecified: Secondary | ICD-10-CM | POA: Insufficient documentation

## 2020-11-14 DIAGNOSIS — J432 Centrilobular emphysema: Secondary | ICD-10-CM | POA: Diagnosis present

## 2020-11-14 DIAGNOSIS — Z87891 Personal history of nicotine dependence: Secondary | ICD-10-CM | POA: Insufficient documentation

## 2020-11-14 DIAGNOSIS — J9612 Chronic respiratory failure with hypercapnia: Secondary | ICD-10-CM | POA: Insufficient documentation

## 2020-11-14 DIAGNOSIS — I10 Essential (primary) hypertension: Secondary | ICD-10-CM | POA: Diagnosis not present

## 2020-11-14 DIAGNOSIS — Z79899 Other long term (current) drug therapy: Secondary | ICD-10-CM | POA: Insufficient documentation

## 2020-11-14 DIAGNOSIS — Z7951 Long term (current) use of inhaled steroids: Secondary | ICD-10-CM | POA: Diagnosis not present

## 2020-11-14 DIAGNOSIS — J9611 Chronic respiratory failure with hypoxia: Secondary | ICD-10-CM | POA: Diagnosis not present

## 2020-11-14 DIAGNOSIS — J418 Mixed simple and mucopurulent chronic bronchitis: Secondary | ICD-10-CM

## 2020-11-14 DIAGNOSIS — R739 Hyperglycemia, unspecified: Secondary | ICD-10-CM | POA: Insufficient documentation

## 2020-11-14 DIAGNOSIS — Z9981 Dependence on supplemental oxygen: Secondary | ICD-10-CM | POA: Diagnosis not present

## 2020-11-14 MED ORDER — AMLODIPINE BESYLATE 5 MG PO TABS: ORAL_TABLET | ORAL | 1 refills | Status: DC

## 2020-11-14 MED ORDER — LOSARTAN POTASSIUM 100 MG PO TABS: 100.0000 mg | ORAL_TABLET | Freq: Every day | ORAL | 2 refills | Status: DC

## 2020-11-14 MED ORDER — VITAMIN D (ERGOCALCIFEROL) 1.25 MG (50000 UNIT) PO CAPS
50000.0000 [IU] | ORAL_CAPSULE | ORAL | 1 refills | Status: DC
Start: 2020-11-14 — End: 2021-06-28

## 2020-11-14 MED ORDER — ALBUTEROL SULFATE (2.5 MG/3ML) 0.083% IN NEBU: 2.5000 mg | INHALATION_SOLUTION | Freq: Three times a day (TID) | RESPIRATORY_TRACT | 1 refills | Status: DC

## 2020-11-14 MED ORDER — POLYETHYLENE GLYCOL 3350 17 GM/SCOOP PO POWD: 17.0000 g | Freq: Every day | ORAL | 1 refills | Status: DC | PRN

## 2020-11-14 MED ORDER — GABAPENTIN 100 MG PO CAPS: 100.0000 mg | ORAL_CAPSULE | Freq: Three times a day (TID) | ORAL | 3 refills | Status: DC

## 2020-11-14 MED ORDER — SIMETHICONE 125 MG PO CAPS: 2.0000 | ORAL_CAPSULE | Freq: Three times a day (TID) | ORAL | 0 refills | Status: DC

## 2020-11-14 MED ORDER — PANTOPRAZOLE SODIUM 40 MG PO TBEC: 40.0000 mg | DELAYED_RELEASE_TABLET | Freq: Two times a day (BID) | ORAL | 3 refills | Status: DC

## 2020-11-14 NOTE — Assessment & Plan Note (Signed)
Chronic abdominal bloating continue simethicone eat in smaller amounts more frequently

## 2020-11-14 NOTE — Assessment & Plan Note (Signed)
Chronic hypoxic respiratory failure continue oxygen therapy

## 2020-11-14 NOTE — Assessment & Plan Note (Signed)
At goal no changes. 

## 2020-11-14 NOTE — Assessment & Plan Note (Signed)
Continue vitamin D replacement.  

## 2020-11-14 NOTE — Patient Instructions (Signed)
Refills on all your medicines sent to your [facility] pharmacy  Take the simethicone 2 capsules 3 times daily with food  Use the flutter valve 3 times daily with your nebulizer or with your inhaler  You need a pneumonia vaccine this was offered today  I would cancel the April 3 gastroenterology appointment for now  Follow diet as outlined below for gas and abdominal bloating you   will also need to eat more often and smaller amounts  Referral to our behavioral health counselor Rowe Clack will be established this will be a video visit or phone visit  Video visit with Dr. Joya Gaskins in 2 months   Abdominal Bloating When you have abdominal bloating, your abdomen may feel full, tight, or painful. It may also look bigger than normal or swollen (distended). Common causes of abdominal bloating include:  Swallowing air.  Constipation.  Problems digesting food.  Eating too much.  Irritable bowel syndrome. This is a condition that affects the large intestine.  Lactose intolerance. This is an inability to digest lactose, a natural sugar in dairy products.  Celiac disease. This is a condition that affects the ability to digest gluten, a protein found in some grains.  Gastroparesis. This is a condition that slows down the movement of food in the stomach and small intestine. It is more common in people with diabetes mellitus.  Gastroesophageal reflux disease (GERD). This is a digestive condition that makes stomach acid flow back into the esophagus.  Urinary retention. This means that the body is holding onto urine, and the bladder cannot be emptied all the way. Follow these instructions at home: Eating and drinking  Avoid eating too much.  Try not to swallow air while talking or eating.  Avoid eating while lying down.  Avoid these foods and drinks: ? Foods that cause gas, such as broccoli, cabbage, cauliflower, and baked beans. ? Carbonated drinks. ? Hard candy. ? Chewing  gum. Medicines  Take over-the-counter and prescription medicines only as told by your health care provider.  Take probiotic medicines. These medicines contain live bacteria or yeasts that can help digestion.  Take coated peppermint oil capsules. Activity  Try to exercise regularly. Exercise may help to relieve bloating that is caused by gas and relieve constipation. General instructions  Keep all follow-up visits as told by your health care provider. This is important. Contact a health care provider if:  You have nausea and vomiting.  You have diarrhea.  You have abdominal pain.  You have unusual weight loss or weight gain.  You have severe pain, and medicines do not help. Get help right away if:  You have severe chest pain.  You have trouble breathing.  You have shortness of breath.  You have trouble urinating.  You have darker urine than normal.  You have blood in your stools or have dark, tarry stools. Summary  Abdominal bloating means that the abdomen is swollen.  Common causes of abdominal bloating are swallowing air, constipation, and problems digesting food.  Avoid eating too much and avoid swallowing air.  Avoid foods that cause gas, carbonated drinks, hard candy, and chewing gum. This information is not intended to replace advice given to you by your health care provider. Make sure you discuss any questions you have with your health care provider. Document Revised: 11/17/2018 Document Reviewed: 08/31/2016 Elsevier Patient Education  2021 [employer].

## 2020-11-14 NOTE — Progress Notes (Signed)
Discuss breathing  Abdominal hernia

## 2020-11-14 NOTE — Assessment & Plan Note (Signed)
Chronic anxiety and depression referral to behavioral therapy given

## 2020-11-14 NOTE — Assessment & Plan Note (Signed)
Flutter valve given

## 2020-11-14 NOTE — Assessment & Plan Note (Signed)
Continue to inhaled medications

## 2020-11-23 ENCOUNTER — Ambulatory Visit: Payer: Medicare Other | Admitting: Nurse Practitioner

## 2020-12-05 ENCOUNTER — Other Ambulatory Visit: Payer: Self-pay | Admitting: Critical Care Medicine

## 2020-12-15 ENCOUNTER — Telehealth (HOSPITAL_COMMUNITY): Payer: Self-pay

## 2020-12-15 NOTE — Telephone Encounter (Signed)
Spoke to [place] who agreed for a home visit next week on Wednesday. Aeron reports she has been feeling okay with no complaints. Call complete.

## 2020-12-20 ENCOUNTER — Other Ambulatory Visit: Payer: Self-pay | Admitting: Critical Care Medicine

## 2020-12-20 ENCOUNTER — Other Ambulatory Visit (HOSPITAL_COMMUNITY): Payer: Self-pay | Admitting: Critical Care Medicine

## 2020-12-20 NOTE — Telephone Encounter (Signed)
   Notes to clinic:  no medication protocol attach to medication  Review continued use and refill    Requested Prescriptions  Pending Prescriptions Disp Refills   ibuprofen (ADVIL) 600 MG tablet [Pharmacy Med Name: IBUPROFEN 600 MG ORAL TABLET] 90 tablet 0    Sig: TAKE 1 TABLET(600 MG) BY MOUTH EVERY 6 HOURS AS NEEDED      There is no refill protocol information for this order

## 2020-12-21 ENCOUNTER — Telehealth (HOSPITAL_COMMUNITY): Payer: Self-pay

## 2020-12-21 ENCOUNTER — Other Ambulatory Visit: Payer: Self-pay

## 2020-12-21 NOTE — Telephone Encounter (Signed)
Verified message sent to Dr. Joya Gaskins regarding patient complaints.   -See outreach on 12/21/20 for details.

## 2020-12-21 NOTE — Progress Notes (Signed)
Paramedicine Encounter    Patient ID: Bianca Murray, female    DOB: [birth year], 65 y.o.   MRN: [id number]   Patient Care Team: Elsie Stain, MD as PCP - General (Pulmonary Disease)  Patient Active Problem List   Diagnosis Date Noted  . Chronic anxiety 09/12/2020  . Generalized anxiety disorder 09/12/2020  . GERD (gastroesophageal reflux disease) 03/22/2020  . Abdominal bloating 12/10/2019  . Hypertension 12/03/2018  . Centrilobular emphysema ([place]) 12/03/2018  . Vitamin D deficiency 11/27/2018  . Chronic respiratory failure with hypoxia and hypercapnia (HCC)   . COPD GOLD D (chronic obstructive pulmonary disease) ([place]) 10/28/2018    Current Outpatient Medications:  .  albuterol (PROVENTIL) (2.5 MG/3ML) 0.083% nebulizer solution, Take 3 mLs (2.5 mg total) by nebulization 3 (three) times daily., Disp: 270 mL, Rfl: 1 .  albuterol (VENTOLIN HFA) 108 (90 Base) MCG/ACT inhaler, INHALE TWO PUFFS BY MOUTH EVERY SIX HOURS AS NEEDED FOR WHEEZING OR SHORTNESS OF BREATH, Disp: 18 g, Rfl: 1 .  amLODipine (NORVASC) 5 MG tablet, TAKE 1 TABLET(5 MG) BY MOUTH DAILY, Disp: 90 tablet, Rfl: 1 .  azelastine (ASTELIN) 0.1 % nasal spray, Place 2 sprays into both nostrils 2 (two) times daily. Use in each nostril as directed, Disp: 30 mL, Rfl: 12 .  Blood Pressure Monitoring (BLOOD PRESSURE MONITOR AUTOMAT) DEVI, Measure blood pressure and pulse daily, Disp: 1 Device, Rfl: 0 .  clonazePAM (KLONOPIN) 0.5 MG tablet, Take two tablets by mouth as needed for anxiety, Disp: 20 tablet, Rfl: 0 .  gabapentin (NEURONTIN) 100 MG capsule, Take 1 capsule (100 mg total) by mouth 3 (three) times daily., Disp: 90 capsule, Rfl: 3 .  Glycopyrrolate-Formoterol (BEVESPI AEROSPHERE) 9-4.8 MCG/ACT AERO, Inhale 2 puffs into the lungs in the morning and at bedtime., Disp: 10.7 g, Rfl: 6 .  ibuprofen (ADVIL) 600 MG tablet, TAKE 1 TABLET(600 MG) BY MOUTH EVERY 6 HOURS AS NEEDED, Disp: 90 tablet, Rfl: 0 .  loratadine (ALLERGY RELIEF)  10 MG tablet, TAKE ONE TABLET BY MOUTH ONCE DAILY (AM), Disp: 30 tablet, Rfl: 3 .  losartan (COZAAR) 100 MG tablet, Take 1 tablet (100 mg total) by mouth daily., Disp: 90 tablet, Rfl: 2 .  Misc. Devices (PULSE OXIMETER FOR FINGER) MISC, 1 application by Does not apply route daily as needed., Disp: 1 each, Rfl: 0 .  Misc. Devices MISC, Nebulizer machine, Large shower chair.  Diagnosis- chronic respiratory failure, Disp: 1 each, Rfl: 0 .  pantoprazole (PROTONIX) 40 MG tablet, Take 1 tablet (40 mg total) by mouth 2 (two) times daily before a meal., Disp: 60 tablet, Rfl: 3 .  polyethylene glycol powder (GLYCOLAX/MIRALAX) 17 GM/SCOOP powder, Take 17 g by mouth daily as needed., Disp: 507 g, Rfl: 1 .  promethazine-dextromethorphan (PROMETHAZINE-DM) 6.25-15 MG/5ML syrup, TAKE 5 MLS BY MOUTH 4 (FOUR) TIMES DAILY AS NEEDED FOR COUGH., Disp: 240 mL, Rfl: 0 .  Simethicone 125 MG CAPS, Take 2 capsules (250 mg total) by mouth 3 (three) times daily with meals., Disp: 180 capsule, Rfl: 0 .  Spacer/Aero-Holding Chambers (AEROCHAMBER MV) inhaler, Use as instructed, Disp: 1 each, Rfl: 0 .  Vitamin D, Ergocalciferol, (DRISDOL) 1.25 MG (50000 UNIT) CAPS capsule, Take 1 capsule (50,000 Units total) by mouth every 7 (seven) days., Disp: 12 capsule, Rfl: 1 No Known Allergies   Social History   Socioeconomic History  . Marital status: Single    Spouse name: Not on file  . Number of children: Not on file  . Years of education:  Not on file  . Highest education level: Not on file  Occupational History  . Not on file  Tobacco Use  . Smoking status: Former Research scientist (life sciences)  . Smokeless tobacco: Never Used  . Tobacco comment: quit smoking in 2018    Vaping Use  . Vaping Use: Never used  Substance and Sexual Activity  . Alcohol use: Not Currently  . Drug use: Not Currently  . Sexual activity: Not on file  Other Topics Concern  . Not on file  Social History Narrative  . Not on file   Social Determinants of Health    Financial Resource Strain: Not on file  Food Insecurity: Not on file  Transportation Needs: Not on file  Physical Activity: Not on file  Stress: Not on file  Social Connections: Not on file  Intimate Partner Violence: Not on file    Physical Exam Vitals reviewed.  Constitutional:      Appearance: Normal appearance. She is normal weight.  HENT:     Head: Normocephalic.     Nose: Nose normal.     Mouth/Throat:     Mouth: Mucous membranes are moist.     Pharynx: Oropharynx is clear.  Cardiovascular:     Rate and Rhythm: Normal rate and regular rhythm.     Pulses: Normal pulses.     Heart sounds: Normal heart sounds.  Pulmonary:     Effort: Pulmonary effort is normal.     Breath sounds: Normal breath sounds.  Abdominal:     General: There is distension.     Tenderness: There is abdominal tenderness.     Hernia: A hernia is present.  Musculoskeletal:        General: Normal range of motion.     Cervical back: Normal range of motion.     Right lower leg: No edema.     Left lower leg: No edema.  Skin:    General: Skin is warm and dry.     Capillary Refill: Capillary refill takes less than 2 seconds.  Neurological:     General: No focal deficit present.     Mental Status: She is alert. Mental status is at baseline.  Psychiatric:        Mood and Affect: Mood normal.      Arrived for home visit for Bianca who was alert and oriented reporting to be feeling okay but complaining of abdominal pain with increased shortness of breath. Murray has a hernia located in the lower area of her abdomen and she reports the hernia has enlarged over the last month an is causing her pain and increased work of breathing. She reports her appetite is decreased because of this and her abdomen gets easily distended upon eating meals and she states it is very uncomfortable. [name] stated she has to re-position herself multiple times to get comfortable enough to sleep without being in pain or short  of breath due to the hernia. [name] stated she has never had the hernia imaged and is wanting to have this further evaluated. Ms. Cuthrell asked me to share these concerns with her PCP, Dr. Joya Gaskins. I will ensure he is made aware and I advised her if her symptoms reached the point of calling EMS to reach out to me and make me aware. She verbalized understanding.    I obtained vitals and they are as noted. Assessment also as noted. Ashaunte gets severely short of breath upon walking short distances and it takes several mins for her to catch  her breath. Salvatrice denied dizziness, chest pain or N/V/D. I reviewed medications and confirmed bubble packs. Paighton is doing well with this.   I advised Amirah I would reach out pending Dr. Ileana Roup response on further eval of her hernia and complaints.   Home visit complete. I will see Chasty in one month in person and follow up telephonically in between.    Refills: NONE      Future Appointments  Date Time Provider [place]  01/16/2021  1:30 PM Elsie Stain, MD [id number] None     ACTION: Home visit completed Next visit planned for one month

## 2020-12-21 NOTE — Telephone Encounter (Signed)
Spoke to [place] we will have a home visit at 1000 today.

## 2020-12-22 ENCOUNTER — Telehealth: Payer: Self-pay | Admitting: Critical Care Medicine

## 2020-12-22 DIAGNOSIS — K429 Umbilical hernia without obstruction or gangrene: Secondary | ICD-10-CM

## 2020-12-22 NOTE — Telephone Encounter (Signed)
Pt likely has umbilical hernia  Will make referral to [facility] [place] [facility].   [place]ta [place] it as urgent  Pt knows to go to ED if she dramatically worsens  And to call heather if she calls ems

## 2020-12-22 NOTE — Telephone Encounter (Signed)
-----   Message from Bianca Murray, EMT sent at 12/21/2020  5:51 PM EDT ----- Regarding: Hernia Outreach visit on 5/11 for [facility], she is concerned with hernia and I advised her I would reach out to you reference her complaints. Please advise and if I can assist further let me know.     Arrived for home visit for Bianca Murray who was alert and oriented reporting to be feeling okay but complaining of abdominal pain with increased shortness of breath. Bianca Murray has a hernia located in the lower area of her abdomen and she reports the hernia has enlarged over the last month an is causing her pain and increased work of breathing. She reports her appetite is decreased because of this and her abdomen gets easily distended upon eating meals and she states it is very uncomfortable. Bianca Murray stated she has to re-position herself multiple times to get comfortable enough to sleep without being in pain or short of breath due to the hernia. Bianca Murray stated she has never had the hernia imaged and is wanting to have this further evaluated. Bianca Murray asked me to share these concerns with her PCP, Dr. Joya Gaskins. I will ensure he is made aware and I advised her if her symptoms reached the point of calling EMS to reach out to me and make me aware. She verbalized understanding.    I obtained vitals and they are as noted. Assessment also as noted. Bianca Murray gets severely short of breath upon walking short distances and it takes several mins for her to catch her breath. Bianca Murray denied dizziness, chest pain or N/V/D. I reviewed medications and confirmed bubble packs. Bianca Murray is doing well with this.   I advised Tal I would reach out pending Dr. Ileana Roup response on further eval of her hernia and complaints.   Home visit complete. I will see Bianca Murray in one month in person and follow up telephonically in between.

## 2021-01-14 ENCOUNTER — Other Ambulatory Visit: Payer: Self-pay | Admitting: Critical Care Medicine

## 2021-01-14 NOTE — Telephone Encounter (Signed)
Called pt to check on symptoms. Pharmacy requested Albuterol early. Pt stated that her sx are under control and does not need the refill at this time For the Albuterol. Will refuse refill request.

## 2021-01-15 NOTE — Progress Notes (Signed)
Subjective:  Virtual Visit via Video Note  I connected with@ on 01/16/21 at@ by a video enabled telemedicine application and verified that I am speaking with the correct person using two identifiers.   Consent:  I discussed the limitations, risks, security and privacy concerns of performing an evaluation and management service by video visit and the availability of in person appointments. I also discussed with the patient that there may be a patient responsible charge related to this service. The patient expressed understanding and agreed to proceed.  Location of patient: Patient was at home  Location of provider: I am in my office  Persons participating in the televisit with the patient.   Daughter was in the home whose name is Bianca Murray    History of Present Illness:   Patient ID: Bianca Murray, female    DOB: [birth year], 65 y.o.   MRN: [id number] 11/27/18 This is a 65 year old female with advanced chronic obstructive lung disease.  Pulmonary functions have been obtained earlier in 2019 by her local pulmonologist in [facility].  This revealed an FEV1 of 25% predicted diffusion capacity of 20% predicted.  This places the patient in the Gold stage D COPD category with significant advanced emphysema. The patient did remove recently from her home in [place] to be closer to her daughter and is now staying in her daughter's home in this move occurred in January 2020.  Since that time the patient began having viral symptoms of an upper respiratory tract infection and was admitted to the hospital on March 17 being discharged on the 25th.  Below are excerpts from the discharge summary  Admit date: 10/28/2018 Discharge date: 11/05/2018  Discharge Diagnosis:  Principal Problem:   Acute on chronic respiratory failure with hypoxia and hypercapnia (HCC) Active Problems:   COPD with acute exacerbation (HCC)   COPD (chronic obstructive pulmonary disease) (HCC)   Chronic respiratory  failure with hypoxia and hypercapnia (HCC)   Hyperglycemia  History of Present Illness:  Bianca Murray a 65 y.o.femalewith medical history significant ofO2 dependent COPD, Asthma. She has required intubation and tracheostomy in the past (though this has been reversed). She moved down to the area a few weeks ago from [place]. No known sick contacts. For the past 4-5 days she has had increased SOB, cough, wheezing. This has been gradually worsening since onset. She is on 3-4L O2 at baseline.   Hospital Course by Problem:  Acute on chronic hypoxic and hypercapnic respiratory failure Secondary to COPD.Slight improvement today.Sats 96%on 3L.Explained to patient needto keep sat between 89-92%.Patientcontinues to feel anxietyand reports worsening sob when oxygen weaned. Improved air movement with less wheeze today -will weansolumedrol from 60mg  every 6hours to evey 8 hours -continue towean oxygen -may need  COPD exacerbation Chestx-ray with no acute abnormality. Hx of ESLD has been evaluated to lung transplant.Patient was unable to tolerate BiPAP.notes from pulmonology reveal most recent visit 01/2017. PFT at that time with showed severe obstructive ventilatory defect, air trapping and mildly reduced diffusion capacity - continuenebs with Brovana and Pulmicort  -will need OP pulm referral  Essential hypertension -resume home meds  Normocytic anemia Hemoglobin stable, outpatient follow-up recommended.  Hyperglycemia: likely related to steroids -carb mod diet  The patient is seen in follow-up today and note has had her oxygen system switch back to [place].  She now has an inogen portable oxygen concentrator.  She runs 3 L continuous.  Her saturations remained in the mid 90s on this.  She states she is still dyspneic  with minimal exertion.  She was on Anoro but it was not covered by her insurance and we switched her to Spiriva she is not doing as well.  She  states mucus gets stuck in her throat.  There is no chest pain.  She is sleeping well.  08/19/2019 Last seen 01/2019 Pt with more dyspnea, cough, no chest pain.  Notes more phlegm is present.  Needs a pulse ox. BP ok    On 4L oxygen.   Cannot tolerate prednisone or inhaled steroid.  Gold D Copd stage D  09/15/2019 Since the last visit the patient's cough has returned.  She is coughing up more thick mucus.  She has wheezing but no fever or chest pain.  She is having more dyspnea.  She states the antibiotic at the last visit was beneficial.  She is maintaining the Anoro and the albuterol nebulizer  12/10/2019 Video visit  Patient is seen in follow-up and states that her cough is markedly improved however she complains of abdominal discomfort increased gas in the stomach early satiety and also constipation he states the cough medicine is useful and would like a refill she maintains the Anoro 1 puff a day she is yet to receive her Covid vaccine.  She notes no belching but has panic attacks at times if she has to get ready in the morning.  She has no real reflux symptoms at this time.  She is now living with her daughter in a new location.   03/10/2020 This is a video visit with this patient has advanced chronic obstructive lung disease. She states her shortness of breath is getting worse. He is coughing and wheezing more and having more mucus that is difficult to raise. She is on the lisinopril and states the back of her throat is irritated. She has had to increase her oxygen up to 4 L. She is on her new inhalers and is using a spacer device. I went over the proper use of the spacer device on the video with the patient. The patient is essentially homebound. She did receive her Covid vaccine still homebound vaccine program.  03/22/2020 This visit is a housecall and that the patient is now homebound and not able to come to the office for examinations and also due to the fact that the Covid viral infection  has spiked again in the county and is not safe for the patient to leave her home due to her severe lung disease   Patient states she continues to have shortness of breath and cough.  The cough is worse at night when she tries to sleep.  She has nasal congestion postnasal drainage.  She rattles in the chest.  She has taken prednisone in the past but she has adverse side effects from this and cannot tolerate this.  The patient states she is having excess gas belching burping and gas from below with abdominal distention.  She is not having bowel movements regularly.  She does maintain oxygen 4 L continuous at this time.  She is maintaining the Bevespi inhaler 2 puffs twice daily with an AeroChamber.  Patient does have a prescription for Promethazine DM but takes it rarely.  She states Gas-X was of no benefit to her.  She states Senokot also did not help her as well.  Patient does maintain losartan 100 mg daily amlodipine 5 mg daily for hypertension.  She denies headache chest pain or other cardiovascular complaints at this time.  She does maintain vitamin D weekly  for severe vitamin D deficiency  06/27/2020 This patient was seen by way of a video visit today.  She has been noting increased abdominal bloating and increased wheezing cough some degree of shortness of breath She has increased rhinorrhea.  She will need a Pneumovax and also flu shot as well as Covid booster shot She has a home blood pressure monitor her blood pressure has been running 142/79 in the home  1/31: Patient is seen in return visit today by way of a telephone visit.  Her largest complaint is severe anxiety when she is showering.  She takes 2.5 mg Klonopin only before shower and states this is not sufficient for her.  She also states she is only taking the inhaler once a day but it is prescribed twice a day.  Patient denies any chest pain increased mucus or other complaints at this time.  Patient is interested in getting a shower  chair that may fit her bathtub more effectively.  Patient is due a flu shot and Pneumovax and she is already received her Covid booster  11/14/20 This patient states her shortness of breath is progressively getting worse she is maintaining inhaled medications.  The patient also complains of abdominal distention early satiety gaseous bloating.  She has significant depression and has is seeking some type of mental health provider for counseling.  She is not been using simethicone as prescribed.  She does not have a functional flutter valve at this time.  Patient is due a Pneumovax Prevnar 13 Patient is using her portable indigent oxygen system.  6.6.22 Patient seen in return follow-up states her dyspnea is quite severe with minimal exertion she is on 4-4 and half liters oxygen today she reports a saturation of 98% on her home oxygen meter with pulse 115.  She states the abdominal gas is the same she is concerned about abdominal hernia activity. Patient is compliant with her inhaled medicines and her new pharmacy is providing packaged medications that she can take them all at once and distinct packaging for daily dosing   Shortness of Breath This is a chronic problem. The current episode started more than 1 year ago. The problem occurs daily. The problem has been gradually worsening. Associated symptoms include chest pain, headaches, leg swelling, rhinorrhea, a sore throat and wheezing. Pertinent negatives include no abdominal pain, ear pain, fever, hemoptysis, leg pain, PND, sputum production or vomiting. The symptoms are aggravated by lying flat and exercise. She has tried beta agonist inhalers, ipratropium inhalers and oral steroids for the symptoms. The treatment provided significant relief. Her past medical history is significant for COPD.    Past Medical History:  Diagnosis Date  . Arthritis   . Asthma   . Cancer ([place])   . Chronic respiratory failure with hypoxia and hypercapnia (HCC)   .  COPD (chronic obstructive pulmonary disease) ([place])   . Hypertension      Family History  Problem Relation Age of Onset  . Hypertension Mother   . Heart disease Mother   . Cancer Mother   . Asthma Father   . Asthma Sister   . Hypertension Sister   . Asthma Brother   . Cancer Brother      Social History   Socioeconomic History  . Marital status: Single    Spouse name: Not on file  . Number of children: Not on file  . Years of education: Not on file  . Highest education level: Not on file  Occupational History  .  Not on file  Tobacco Use  . Smoking status: Former Research scientist (life sciences)  . Smokeless tobacco: Never Used  . Tobacco comment: quit smoking in 2018    Vaping Use  . Vaping Use: Never used  Substance and Sexual Activity  . Alcohol use: Not Currently  . Drug use: Not Currently  . Sexual activity: Not on file  Other Topics Concern  . Not on file  Social History Narrative  . Not on file   Social Determinants of Health   Financial Resource Strain: Not on file  Food Insecurity: Not on file  Transportation Needs: Not on file  Physical Activity: Not on file  Stress: Not on file  Social Connections: Not on file  Intimate Partner Violence: Not on file     No Known Allergies   Outpatient Medications Prior to Visit  Medication Sig Dispense Refill  . albuterol (VENTOLIN HFA) 108 (90 Base) MCG/ACT inhaler INHALE TWO PUFFS BY MOUTH EVERY SIX HOURS AS NEEDED FOR WHEEZING OR SHORTNESS OF BREATH 18 g 1  . amLODipine (NORVASC) 5 MG tablet TAKE 1 TABLET(5 MG) BY MOUTH DAILY 90 tablet 1  . azelastine (ASTELIN) 0.1 % nasal spray Place 2 sprays into both nostrils 2 (two) times daily. Use in each nostril as directed 30 mL 12  . Blood Pressure Monitoring (BLOOD PRESSURE MONITOR AUTOMAT) DEVI Measure blood pressure and pulse daily 1 Device 0  . clonazePAM (KLONOPIN) 0.5 MG tablet Take two tablets by mouth as needed for anxiety 20 tablet 0  . gabapentin (NEURONTIN) 100 MG capsule Take 1  capsule (100 mg total) by mouth 3 (three) times daily. 90 capsule 3  . Glycopyrrolate-Formoterol (BEVESPI AEROSPHERE) 9-4.8 MCG/ACT AERO Inhale 2 puffs into the lungs in the morning and at bedtime. 10.7 g 6  . ibuprofen (ADVIL) 600 MG tablet TAKE 1 TABLET(600 MG) BY MOUTH EVERY 6 HOURS AS NEEDED 90 tablet 0  . loratadine (ALLERGY RELIEF) 10 MG tablet TAKE ONE TABLET BY MOUTH ONCE DAILY (AM) 30 tablet 3  . losartan (COZAAR) 100 MG tablet Take 1 tablet (100 mg total) by mouth daily. 90 tablet 2  . Misc. Devices (PULSE OXIMETER FOR FINGER) MISC 1 application by Does not apply route daily as needed. 1 each 0  . Misc. Devices MISC Nebulizer machine, Large shower chair.  Diagnosis- chronic respiratory failure 1 each 0  . pantoprazole (PROTONIX) 40 MG tablet Take 1 tablet (40 mg total) by mouth 2 (two) times daily before a meal. 60 tablet 3  . polyethylene glycol powder (GLYCOLAX/MIRALAX) 17 GM/SCOOP powder Take 17 g by mouth daily as needed. 507 g 1  . promethazine-dextromethorphan (PROMETHAZINE-DM) 6.25-15 MG/5ML syrup TAKE 5 MLS BY MOUTH 4 (FOUR) TIMES DAILY AS NEEDED FOR COUGH. 240 mL 0  . Spacer/Aero-Holding Chambers (AEROCHAMBER MV) inhaler Use as instructed 1 each 0  . Vitamin D, Ergocalciferol, (DRISDOL) 1.25 MG (50000 UNIT) CAPS capsule Take 1 capsule (50,000 Units total) by mouth every 7 (seven) days. 12 capsule 1  . albuterol (PROVENTIL) (2.5 MG/3ML) 0.083% nebulizer solution Take 3 mLs (2.5 mg total) by nebulization 3 (three) times daily. 270 mL 1  . Simethicone 125 MG CAPS Take 2 capsules (250 mg total) by mouth 3 (three) times daily with meals. 180 capsule 0   No facility-administered medications prior to visit.     Review of Systems  Constitutional: Positive for activity change and fatigue. Negative for fever.  HENT: Positive for rhinorrhea and sore throat. Negative for ear pain.   Eyes:  Negative.   Respiratory: Positive for cough, chest tightness, shortness of breath and wheezing.  Negative for hemoptysis and sputum production.   Cardiovascular: Positive for chest pain and leg swelling. Negative for palpitations and PND.  Gastrointestinal: Negative.  Negative for abdominal pain and vomiting.  Endocrine: Negative.   Genitourinary: Negative.   Musculoskeletal: Negative.   Skin: Negative.   Allergic/Immunologic: Negative.   Neurological: Positive for headaches.  Hematological: Negative.   Psychiatric/Behavioral: Negative.        Objective:   Physical Exam There were no vitals filed for this visit. This patient is seen by way of video is in no acute distress The patient showed me her abdomen I cannot see any evidence of an umbilical hernia at this time  BMP Latest Ref Rng & Units 11/04/2018 11/03/2018 10/28/2018  Glucose 70 - 99 mg/dL 184(H) 165(H) -  BUN 8 - 23 mg/dL 19 19 -  Creatinine 0.44 - 1.00 mg/dL 0.80 0.81 -  Sodium 135 - 145 mmol/L 138 135 139  Potassium 3.5 - 5.1 mmol/L 4.2 3.9 3.3(L)  Chloride 98 - 111 mmol/L 98 99 -  CO2 22 - 32 mmol/L 33(H) 26 -  Calcium 8.9 - 10.3 mg/dL 9.1 9.0 -   CBC Latest Ref Rng & Units 11/04/2018 10/28/2018 10/28/2018  WBC 4.0 - 10.5 K/uL 13.6(H) - 7.1  Hemoglobin 12.0 - 15.0 g/dL 10.8(L) 11.9(L) 11.9(L)  Hematocrit 36.0 - 46.0 % 35.1(L) 35.0(L) 38.5  Platelets 150 - 400 K/uL 308 - 343  CXR: 3/18 IMPRESSION: No acute abnormality seen. ABG  FIO2 1.0:    Component Value Date/Time   PHART 7.320 (L) 10/28/2018 0255   PCO2ART 62.8 (H) 10/28/2018 0255   PO2ART 196.0 (H) 10/28/2018 0255   HCO3 32.3 (H) 10/28/2018 0255   TCO2 34 (H) 10/28/2018 0255   O2SAT 100.0 10/28/2018 0255  Pulmonary functions have been obtained earlier in 2019 by her local pulmonologist in [facility].  This revealed an FEV1 of 25% predicted and diffusion capacity of 20% predicted.  This places the patient in the Gold stage D COPD category with significant advanced emphysema.     Assessment & Plan:  I personally reviewed all images and lab  data in the [facility] system as well as any outside material available during this office visit and agree with the  radiology impressions.   COPD GOLD D (chronic obstructive pulmonary disease) (HCC) Gold stage D COPD stable at this time  Continue inhaled medications as prescribed no changes made  Chronic respiratory failure with hypoxia and hypercapnia (HCC) Chronic respiratory failure with hypoxia and hypercapnia continue inhaled medicines and oxygen at 4 L  Centrilobular emphysema (HCC) As per COPD assessment  Generalized anxiety disorder Generalized anxiety disorder we will refill Klonopin  Abdominal bloating On video exam no evidence of overt umbilical hernia will observe for now   Diagnoses and all orders for this visit:  Mixed simple and mucopurulent chronic bronchitis (HCC)  Chronic respiratory failure with hypoxia and hypercapnia (HCC)  Centrilobular emphysema (HCC)  Generalized anxiety disorder  Abdominal bloating    Spent 36 minutes on this phone with video visit and non-face-to-face review of chart and complex decision Follow Up Instructions: Patient knows a housecall visit will occur in July   I discussed the assessment and treatment plan with the patient. The patient was provided an opportunity to ask questions and all were answered. The patient agreed with the plan and demonstrated an understanding of the instructions.   The patient was  advised to call back or seek an in-person evaluation if the symptoms worsen or if the condition fails to improve as anticipated.  I provided 36 minutes of non-face-to-face time during this encounter  including  median intraservice time , review of notes, labs, imaging, medications  and explaining diagnosis and management to the patient .    Asencion Noble, MD

## 2021-01-16 ENCOUNTER — Ambulatory Visit: Payer: Medicare Other | Attending: Critical Care Medicine | Admitting: Critical Care Medicine

## 2021-01-16 ENCOUNTER — Encounter: Payer: Self-pay | Admitting: Critical Care Medicine

## 2021-01-16 ENCOUNTER — Other Ambulatory Visit: Payer: Self-pay

## 2021-01-16 DIAGNOSIS — J9611 Chronic respiratory failure with hypoxia: Secondary | ICD-10-CM | POA: Diagnosis not present

## 2021-01-16 DIAGNOSIS — Z87891 Personal history of nicotine dependence: Secondary | ICD-10-CM | POA: Diagnosis not present

## 2021-01-16 DIAGNOSIS — Z8249 Family history of ischemic heart disease and other diseases of the circulatory system: Secondary | ICD-10-CM | POA: Diagnosis not present

## 2021-01-16 DIAGNOSIS — J418 Mixed simple and mucopurulent chronic bronchitis: Secondary | ICD-10-CM

## 2021-01-16 DIAGNOSIS — R739 Hyperglycemia, unspecified: Secondary | ICD-10-CM | POA: Insufficient documentation

## 2021-01-16 DIAGNOSIS — Z79899 Other long term (current) drug therapy: Secondary | ICD-10-CM | POA: Insufficient documentation

## 2021-01-16 DIAGNOSIS — F411 Generalized anxiety disorder: Secondary | ICD-10-CM | POA: Diagnosis not present

## 2021-01-16 DIAGNOSIS — J9621 Acute and chronic respiratory failure with hypoxia: Secondary | ICD-10-CM | POA: Diagnosis not present

## 2021-01-16 DIAGNOSIS — R140 Abdominal distension (gaseous): Secondary | ICD-10-CM | POA: Diagnosis not present

## 2021-01-16 DIAGNOSIS — D649 Anemia, unspecified: Secondary | ICD-10-CM | POA: Diagnosis not present

## 2021-01-16 DIAGNOSIS — J9622 Acute and chronic respiratory failure with hypercapnia: Secondary | ICD-10-CM | POA: Diagnosis not present

## 2021-01-16 DIAGNOSIS — Z9981 Dependence on supplemental oxygen: Secondary | ICD-10-CM | POA: Diagnosis not present

## 2021-01-16 DIAGNOSIS — J432 Centrilobular emphysema: Secondary | ICD-10-CM | POA: Diagnosis not present

## 2021-01-16 DIAGNOSIS — J9612 Chronic respiratory failure with hypercapnia: Secondary | ICD-10-CM

## 2021-01-16 DIAGNOSIS — I10 Essential (primary) hypertension: Secondary | ICD-10-CM | POA: Diagnosis not present

## 2021-01-16 NOTE — Assessment & Plan Note (Signed)
Generalized anxiety disorder we will refill Klonopin

## 2021-01-16 NOTE — Assessment & Plan Note (Signed)
Gold stage D COPD stable at this time  Continue inhaled medications as prescribed no changes made

## 2021-01-16 NOTE — Assessment & Plan Note (Signed)
Chronic respiratory failure with hypoxia and hypercapnia continue inhaled medicines and oxygen at 4 L

## 2021-01-16 NOTE — Assessment & Plan Note (Signed)
As per COPD assessment 

## 2021-01-16 NOTE — Assessment & Plan Note (Signed)
On video exam no evidence of overt umbilical hernia will observe for now

## 2021-01-19 ENCOUNTER — Other Ambulatory Visit: Payer: Self-pay | Admitting: Critical Care Medicine

## 2021-01-19 ENCOUNTER — Telehealth: Payer: Self-pay | Admitting: Critical Care Medicine

## 2021-01-19 MED ORDER — IBUPROFEN 600 MG PO TABS: ORAL_TABLET | ORAL | 0 refills | Status: DC

## 2021-01-19 MED ORDER — DOXYCYCLINE HYCLATE 100 MG PO TABS
100.0000 mg | ORAL_TABLET | Freq: Two times a day (BID) | ORAL | 0 refills | Status: DC
Start: 2021-01-19 — End: 2021-03-15

## 2021-01-19 NOTE — Telephone Encounter (Signed)
Pt is calling to request if Dr. Joya Gaskins can send an antibiotic to the pharmacy for her for congestion in her chest. Pt denies wanting appt - she states she had an appt on 01/16/21/ [phone]

## 2021-01-19 NOTE — Telephone Encounter (Signed)
Called pt unable to reach 

## 2021-01-19 NOTE — Telephone Encounter (Signed)
Doxy sent to pharmacy 

## 2021-01-19 NOTE — Telephone Encounter (Signed)
Medication:ibuprofen (ADVIL) 600 MG tablet [[id number]]   Has the patient contacted their pharmacy? YES  (Agent: If no, request that the patient contact the pharmacy for the refill.) (Agent: If yes, when and what did the pharmacy advise?)  Preferred Pharmacy (with phone number or street name): [place], [place] - [address] [place] [address] Phone: [phone] Fax: [phone] Hours: Not open 24 hours    Agent: Please be advised that RX refills may take up to 3 business days. We ask that you follow-up with your pharmacy.

## 2021-01-20 NOTE — Telephone Encounter (Signed)
Called pt made aware of MD message.

## 2021-01-24 ENCOUNTER — Telehealth (HOSPITAL_COMMUNITY): Payer: Self-pay

## 2021-01-24 NOTE — Telephone Encounter (Signed)
Called Bianca Murray and set up home visit for tomorrow at [facility]. Call complete.

## 2021-01-26 ENCOUNTER — Other Ambulatory Visit: Payer: Self-pay

## 2021-01-26 NOTE — Progress Notes (Signed)
Paramedicine Encounter    Patient ID: Bianca Murray, female    DOB: Nov 08, 1955, 65 y.o.   MRN: [id number]   Patient Care Team: Elsie Stain, MD as PCP - General (Pulmonary Disease)  Patient Active Problem List   Diagnosis Date Noted   Chronic anxiety 09/12/2020   Generalized anxiety disorder 09/12/2020   GERD (gastroesophageal reflux disease) 03/22/2020   Abdominal bloating 12/10/2019   Hypertension 12/03/2018   Centrilobular emphysema ([place]) 12/03/2018   Vitamin D deficiency 11/27/2018   Chronic respiratory failure with hypoxia and hypercapnia (HCC)    COPD GOLD D (chronic obstructive pulmonary disease) ([place]) 10/28/2018    Current Outpatient Medications:    albuterol (PROVENTIL) (2.5 MG/3ML) 0.083% nebulizer solution, Take 3 mLs (2.5 mg total) by nebulization 3 (three) times daily., Disp: 270 mL, Rfl: 1   albuterol (VENTOLIN HFA) 108 (90 Base) MCG/ACT inhaler, INHALE TWO PUFFS BY MOUTH EVERY SIX HOURS AS NEEDED FOR WHEEZING OR SHORTNESS OF BREATH, Disp: 18 g, Rfl: 1   amLODipine (NORVASC) 5 MG tablet, TAKE 1 TABLET(5 MG) BY MOUTH DAILY, Disp: 90 tablet, Rfl: 1   azelastine (ASTELIN) 0.1 % nasal spray, Place 2 sprays into both nostrils 2 (two) times daily. Use in each nostril as directed, Disp: 30 mL, Rfl: 12   Blood Pressure Monitoring (BLOOD PRESSURE MONITOR AUTOMAT) DEVI, Measure blood pressure and pulse daily, Disp: 1 Device, Rfl: 0   clonazePAM (KLONOPIN) 0.5 MG tablet, Take two tablets by mouth as needed for anxiety, Disp: 20 tablet, Rfl: 0   doxycycline (VIBRA-TABS) 100 MG tablet, Take 1 tablet (100 mg total) by mouth 2 (two) times daily., Disp: 14 tablet, Rfl: 0   gabapentin (NEURONTIN) 100 MG capsule, Take 1 capsule (100 mg total) by mouth 3 (three) times daily., Disp: 90 capsule, Rfl: 3   Glycopyrrolate-Formoterol (BEVESPI AEROSPHERE) 9-4.8 MCG/ACT AERO, Inhale 2 puffs into the lungs in the morning and at bedtime., Disp: 10.7 g, Rfl: 6   ibuprofen (ADVIL) 600 MG tablet,  TAKE 1 TABLET(600 MG) BY MOUTH EVERY 6 HOURS AS NEEDED, Disp: 90 tablet, Rfl: 0   loratadine (ALLERGY RELIEF) 10 MG tablet, TAKE ONE TABLET BY MOUTH ONCE DAILY (AM), Disp: 30 tablet, Rfl: 3   losartan (COZAAR) 100 MG tablet, Take 1 tablet (100 mg total) by mouth daily., Disp: 90 tablet, Rfl: 2   Misc. Devices (PULSE OXIMETER FOR FINGER) MISC, 1 application by Does not apply route daily as needed., Disp: 1 each, Rfl: 0   Misc. Devices MISC, Nebulizer machine, Large shower chair.  Diagnosis- chronic respiratory failure, Disp: 1 each, Rfl: 0   pantoprazole (PROTONIX) 40 MG tablet, Take 1 tablet (40 mg total) by mouth 2 (two) times daily before a meal., Disp: 60 tablet, Rfl: 3   polyethylene glycol powder (GLYCOLAX/MIRALAX) 17 GM/SCOOP powder, Take 17 g by mouth daily as needed., Disp: 507 g, Rfl: 1   promethazine-dextromethorphan (PROMETHAZINE-DM) 6.25-15 MG/5ML syrup, TAKE 5 MLS BY MOUTH 4 (FOUR) TIMES DAILY AS NEEDED FOR COUGH., Disp: 240 mL, Rfl: 0   Simethicone 125 MG CAPS, Take 2 capsules (250 mg total) by mouth 3 (three) times daily with meals., Disp: 180 capsule, Rfl: 0   Spacer/Aero-Holding Chambers (AEROCHAMBER MV) inhaler, Use as instructed, Disp: 1 each, Rfl: 0   Vitamin D, Ergocalciferol, (DRISDOL) 1.25 MG (50000 UNIT) CAPS capsule, Take 1 capsule (50,000 Units total) by mouth every 7 (seven) days., Disp: 12 capsule, Rfl: 1 No Known Allergies   Social History   Socioeconomic History  Marital status: Single    Spouse name: Not on file   Number of children: Not on file   Years of education: Not on file   Highest education level: Not on file  Occupational History   Not on file  Tobacco Use   Smoking status: Former    Pack years: 0.00   Smokeless tobacco: Never   Tobacco comments:    quit smoking in 2018    Vaping Use   Vaping Use: Never used  Substance and Sexual Activity   Alcohol use: Not Currently   Drug use: Not Currently   Sexual activity: Not on file  Other Topics  Concern   Not on file  Social History Narrative   Not on file   Social Determinants of Health   Financial Resource Strain: Not on file  Food Insecurity: Not on file  Transportation Needs: Not on file  Physical Activity: Not on file  Stress: Not on file  Social Connections: Not on file  Intimate Partner Violence: Not on file    Physical Exam Vitals reviewed.  Constitutional:      Appearance: She is normal weight.  HENT:     Head: Normocephalic.     Nose: Nose normal.     Mouth/Throat:     Mouth: Mucous membranes are moist.     Pharynx: Oropharynx is clear.  Eyes:     Conjunctiva/sclera: Conjunctivae normal.     Pupils: Pupils are equal, round, and reactive to light.  Cardiovascular:     Rate and Rhythm: Normal rate and regular rhythm.  Pulmonary:     Effort: Respiratory distress present.     Breath sounds: Wheezing present.  Abdominal:     General: There is distension.     Hernia: A hernia is present.  Musculoskeletal:        General: Normal range of motion.     Cervical back: Normal range of motion.     Right lower leg: No edema.     Left lower leg: No edema.  Skin:    General: Skin is warm and dry.     Capillary Refill: Capillary refill takes less than 2 seconds.  Neurological:     General: No focal deficit present.     Mental Status: She is alert. Mental status is at baseline.  Psychiatric:        Mood and Affect: Mood normal.    Arrived for home visit for [name] who was alert in her living room short of breath after walking to unlock the door. [name] was on her home oxygen and was short of breath and wheezing with exertion. Oxygen levels were 90% after movement and improved to 97% after being seated and resting. [name] reports she had not yet had her albuterol inhaler today. [name] administered same at 1005. [name] reports she has been compliant with her medications. [name] reports she has an appointment with [facility] in July for consult  for her hernia. I reminded her of her appointment in august with Dr. Joya Gaskins she was aware. Vitals and assessment as noted. [name] work of breathing improved after rest. [name] agreed to visit in two weeks to review paperwork for upcoming appointment with [facility]. Anshi was inquiring about insurance coverage. We discussed applying for medicaid. I sent over application to her daughters email. Kristel also interested about housing opportunities for senior living. I will gather information and bring to next visit. I will continue to follow up as needed. Home visit complete.  Refills:  Bubble packs refill reminder sent to [facility] at [employer].     Future Appointments  Date Time Provider [place]  03/14/2021  4:00 PM Elsie Stain, MD [facility] None     ACTION: Home visit completed

## 2021-02-22 ENCOUNTER — Other Ambulatory Visit: Payer: Self-pay

## 2021-02-22 NOTE — Progress Notes (Signed)
Paramedicine Encounter    Patient ID: Bianca Murray, female    DOB: 11-13-1955, 65 y.o.   MRN: [id number]   Patient Care Team: Elsie Stain, MD as PCP - General (Pulmonary Disease)  Patient Active Problem List   Diagnosis Date Noted   Chronic anxiety 09/12/2020   Generalized anxiety disorder 09/12/2020   GERD (gastroesophageal reflux disease) 03/22/2020   Abdominal bloating 12/10/2019   Hypertension 12/03/2018   Centrilobular emphysema ([place]) 12/03/2018   Vitamin D deficiency 11/27/2018   Chronic respiratory failure with hypoxia and hypercapnia (HCC)    COPD GOLD D (chronic obstructive pulmonary disease) ([place]) 10/28/2018    Current Outpatient Medications:    albuterol (PROVENTIL) (2.5 MG/3ML) 0.083% nebulizer solution, Take 3 mLs (2.5 mg total) by nebulization 3 (three) times daily., Disp: 270 mL, Rfl: 1   albuterol (VENTOLIN HFA) 108 (90 Base) MCG/ACT inhaler, INHALE TWO PUFFS BY MOUTH EVERY SIX HOURS AS NEEDED FOR WHEEZING OR SHORTNESS OF BREATH, Disp: 18 g, Rfl: 1   amLODipine (NORVASC) 5 MG tablet, TAKE 1 TABLET(5 MG) BY MOUTH DAILY, Disp: 90 tablet, Rfl: 1   azelastine (ASTELIN) 0.1 % nasal spray, Place 2 sprays into both nostrils 2 (two) times daily. Use in each nostril as directed, Disp: 30 mL, Rfl: 12   Blood Pressure Monitoring (BLOOD PRESSURE MONITOR AUTOMAT) DEVI, Measure blood pressure and pulse daily, Disp: 1 Device, Rfl: 0   clonazePAM (KLONOPIN) 0.5 MG tablet, Take two tablets by mouth as needed for anxiety, Disp: 20 tablet, Rfl: 0   doxycycline (VIBRA-TABS) 100 MG tablet, Take 1 tablet (100 mg total) by mouth 2 (two) times daily., Disp: 14 tablet, Rfl: 0   gabapentin (NEURONTIN) 100 MG capsule, Take 1 capsule (100 mg total) by mouth 3 (three) times daily., Disp: 90 capsule, Rfl: 3   Glycopyrrolate-Formoterol (BEVESPI AEROSPHERE) 9-4.8 MCG/ACT AERO, Inhale 2 puffs into the lungs in the morning and at bedtime., Disp: 10.7 g, Rfl: 6   ibuprofen (ADVIL) 600 MG tablet,  TAKE 1 TABLET(600 MG) BY MOUTH EVERY 6 HOURS AS NEEDED, Disp: 90 tablet, Rfl: 0   loratadine (ALLERGY RELIEF) 10 MG tablet, TAKE ONE TABLET BY MOUTH ONCE DAILY (AM), Disp: 30 tablet, Rfl: 3   losartan (COZAAR) 100 MG tablet, Take 1 tablet (100 mg total) by mouth daily., Disp: 90 tablet, Rfl: 2   Misc. Devices (PULSE OXIMETER FOR FINGER) MISC, 1 application by Does not apply route daily as needed., Disp: 1 each, Rfl: 0   Misc. Devices MISC, Nebulizer machine, Large shower chair.  Diagnosis- chronic respiratory failure, Disp: 1 each, Rfl: 0   pantoprazole (PROTONIX) 40 MG tablet, Take 1 tablet (40 mg total) by mouth 2 (two) times daily before a meal., Disp: 60 tablet, Rfl: 3   polyethylene glycol powder (GLYCOLAX/MIRALAX) 17 GM/SCOOP powder, Take 17 g by mouth daily as needed., Disp: 507 g, Rfl: 1   promethazine-dextromethorphan (PROMETHAZINE-DM) 6.25-15 MG/5ML syrup, TAKE 5 MLS BY MOUTH 4 (FOUR) TIMES DAILY AS NEEDED FOR COUGH., Disp: 240 mL, Rfl: 0   Simethicone 125 MG CAPS, Take 2 capsules (250 mg total) by mouth 3 (three) times daily with meals., Disp: 180 capsule, Rfl: 0   Spacer/Aero-Holding Chambers (AEROCHAMBER MV) inhaler, Use as instructed, Disp: 1 each, Rfl: 0   Vitamin D, Ergocalciferol, (DRISDOL) 1.25 MG (50000 UNIT) CAPS capsule, Take 1 capsule (50,000 Units total) by mouth every 7 (seven) days., Disp: 12 capsule, Rfl: 1 No Known Allergies   Social History   Socioeconomic History  Marital status: Single    Spouse name: Not on file   Number of children: Not on file   Years of education: Not on file   Highest education level: Not on file  Occupational History   Not on file  Tobacco Use   Smoking status: Former    Pack years: 0.00   Smokeless tobacco: Never   Tobacco comments:    quit smoking in 2018    Vaping Use   Vaping Use: Never used  Substance and Sexual Activity   Alcohol use: Not Currently   Drug use: Not Currently   Sexual activity: Not on file  Other Topics  Concern   Not on file  Social History Narrative   Not on file   Social Determinants of Health   Financial Resource Strain: Not on file  Food Insecurity: Not on file  Transportation Needs: Not on file  Physical Activity: Not on file  Stress: Not on file  Social Connections: Not on file  Intimate Partner Violence: Not on file    Physical Exam Vitals reviewed.  Constitutional:      Appearance: Normal appearance. She is normal weight.  HENT:     Head: Normocephalic.     Nose: Nose normal.     Mouth/Throat:     Mouth: Mucous membranes are moist.     Pharynx: Oropharynx is clear.  Eyes:     Pupils: Pupils are equal, round, and reactive to light.  Cardiovascular:     Rate and Rhythm: Normal rate and regular rhythm.     Pulses: Normal pulses.     Heart sounds: Normal heart sounds.  Pulmonary:     Effort: Pulmonary effort is normal.     Breath sounds: Normal breath sounds.  Abdominal:     General: Abdomen is flat.     Palpations: Abdomen is soft.  Musculoskeletal:        General: Normal range of motion.     Cervical back: Normal range of motion.     Right lower leg: No edema.     Left lower leg: No edema.  Skin:    General: Skin is warm and dry.     Capillary Refill: Capillary refill takes less than 2 seconds.  Neurological:     General: No focal deficit present.     Mental Status: She is alert. Mental status is at baseline.  Psychiatric:        Mood and Affect: Mood normal.   Arrived for home visit for Bianca Murray who was alert and oriented seated on her bed on her home oxygen. Bianca Murray reported she has been feeling good, taking her medicines. Bianca Murray reported her stomach pain is still present sometimes and causes her to get short of breath but she reports she hasn't felt the need to be further evaluated in the ER. I advised her to seek that care if needed.   Bianca Murray home aide was present.   I reviewed bubble packs and confirmed doses.   Vitals were obtained and are as  noted.  Assessment as noted.   Bianca Murray [facility] appointment rescheduled for Aug 19th at 3:10  I plan to see Bianca Murray in one month. Home visit complete.      Future Appointments  Date Time Provider [place]  03/14/2021  4:00 PM Elsie Stain, MD [facility] None     ACTION: Home visit completed

## 2021-03-10 ENCOUNTER — Telehealth (INDEPENDENT_AMBULATORY_CARE_PROVIDER_SITE_OTHER): Payer: Self-pay

## 2021-03-10 NOTE — Telephone Encounter (Signed)
Spoke with patient and advised she contact medicare to inquire about applying for medicare part A. Nat Christen, CMA     Copied from [place] [id number]. Topic: General - Other >> Mar 07, 2021 11:27 AM Celene Kras wrote: Reason for CRM: Pt called and is requesting to speak with PCP. She states that she is currently using Part B Medicare, but is looking to get Part A as well. She states that she has been advised to contact PCP for help. Please advise.

## 2021-03-14 ENCOUNTER — Other Ambulatory Visit: Payer: Self-pay

## 2021-03-14 ENCOUNTER — Ambulatory Visit: Payer: Medicare Other | Attending: Critical Care Medicine | Admitting: Critical Care Medicine

## 2021-03-14 DIAGNOSIS — J418 Mixed simple and mucopurulent chronic bronchitis: Secondary | ICD-10-CM

## 2021-03-14 DIAGNOSIS — F411 Generalized anxiety disorder: Secondary | ICD-10-CM

## 2021-03-14 DIAGNOSIS — Z23 Encounter for immunization: Secondary | ICD-10-CM

## 2021-03-14 NOTE — Progress Notes (Signed)
Subjective:  History of Present Illness:   Patient ID: Bianca Murray, female    DOB: 30-Apr-1956, 65 y.o.   MRN: [id number] 11/27/18 This is a 65 year old female with advanced chronic obstructive lung disease.  Pulmonary functions have been obtained earlier in 2019 by her local pulmonologist in [facility].  This revealed an FEV1 of 25% predicted diffusion capacity of 20% predicted.  This places the patient in the Gold stage D COPD category with significant advanced emphysema. The patient did remove recently from her home in [place] to be closer to her daughter and is now staying in her daughter's home in this move occurred in January 2020.  Since that time the patient began having viral symptoms of an upper respiratory tract infection and was admitted to the hospital on March 17 being discharged on the 25th.  Below are excerpts from the discharge summary  Admit date: 10/28/2018 Discharge date: 11/05/2018   Discharge Diagnosis:   Principal Problem:   Acute on chronic respiratory failure with hypoxia and hypercapnia (HCC) Active Problems:   COPD with acute exacerbation (HCC)   COPD (chronic obstructive pulmonary disease) (HCC)   Chronic respiratory failure with hypoxia and hypercapnia (HCC)   Hyperglycemia   History of Present Illness:   Bianca Murray is a 65 y.o. female with medical history significant of O2 dependent COPD, Asthma.  She has required intubation and tracheostomy in the past (though this has been reversed).  She moved down to the area a few weeks ago from [place].  No known sick contacts.  For the past 4-5 days she has had increased SOB, cough, wheezing.  This has been gradually worsening since onset.  She is on 3-4L O2 at baseline.     Hospital Course by Problem:   Acute on chronic hypoxic and hypercapnic respiratory failure Secondary to COPD. Slight improvement today.  Sats 96% on 3L.  Explained to patient need to keep sat between 89-92%. Patient continues to feel  anxiety and reports worsening sob when oxygen weaned. Improved air movement with less wheeze today  -will wean solumedrol from '60mg'$  every 6hours to evey 8 hours - continue to wean oxygen -may need   COPD exacerbation Chest x-ray with no acute abnormality. Hx of ESLD has been evaluated to lung transplant. Patient was unable to tolerate BiPAP.  notes from pulmonology reveal most recent visit 01/2017. PFT at that time with showed severe obstructive ventilatory defect, air trapping and mildly reduced diffusion capacity - continue nebs with Brovana and Pulmicort  -will need OP pulm referral   Essential hypertension  -resume home meds   Normocytic anemia Hemoglobin stable, outpatient follow-up recommended.   Hyperglycemia: likely related to steroids -carb mod diet   The patient is seen in follow-up today and note has had her oxygen system switch back to [place].  She now has an inogen portable oxygen concentrator.  She runs 3 L continuous.  Her saturations remained in the mid 90s on this.  She states she is still dyspneic with minimal exertion.  She was on Anoro but it was not covered by her insurance and we switched her to Spiriva she is not doing as well.  She states mucus gets stuck in her throat.  There is no chest pain.  She is sleeping well.  08/19/2019 Last seen 01/2019 Pt with more dyspnea, cough, no chest pain.  Notes more phlegm is present.  Needs a pulse ox. BP ok    On 4L oxygen.   Cannot tolerate prednisone  or inhaled steroid.  Gold D Copd stage D  09/15/2019 Since the last visit the patient's cough has returned.  She is coughing up more thick mucus.  She has wheezing but no fever or chest pain.  She is having more dyspnea.  She states the antibiotic at the last visit was beneficial.  She is maintaining the Anoro and the albuterol nebulizer  12/10/2019 Video visit  Patient is seen in follow-up and states that her cough is markedly improved however she complains of abdominal  discomfort increased gas in the stomach early satiety and also constipation he states the cough medicine is useful and would like a refill she maintains the Anoro 1 puff a day she is yet to receive her Covid vaccine.  She notes no belching but has panic attacks at times if she has to get ready in the morning.  She has no real reflux symptoms at this time.  She is now living with her daughter in a new location.   03/10/2020 This is a video visit with this patient has advanced chronic obstructive lung disease. She states her shortness of breath is getting worse. He is coughing and wheezing more and having more mucus that is difficult to raise. She is on the lisinopril and states the back of her throat is irritated. She has had to increase her oxygen up to 4 L. She is on her new inhalers and is using a spacer device. I went over the proper use of the spacer device on the video with the patient. The patient is essentially homebound. She did receive her Covid vaccine still homebound vaccine program.  03/22/2020 This visit is a housecall and that the patient is now homebound and not able to come to the office for examinations and also due to the fact that the Covid viral infection has spiked again in the county and is not safe for the patient to leave her home due to her severe lung disease   Patient states she continues to have shortness of breath and cough.  The cough is worse at night when she tries to sleep.  She has nasal congestion postnasal drainage.  She rattles in the chest.  She has taken prednisone in the past but she has adverse side effects from this and cannot tolerate this.  The patient states she is having excess gas belching burping and gas from below with abdominal distention.  She is not having bowel movements regularly.  She does maintain oxygen 4 L continuous at this time.  She is maintaining the Bevespi inhaler 2 puffs twice daily with an AeroChamber.  Patient does have a prescription for  Promethazine DM but takes it rarely.  She states Gas-X was of no benefit to her.  She states Senokot also did not help her as well.  Patient does maintain losartan 100 mg daily amlodipine 5 mg daily for hypertension.  She denies headache chest pain or other cardiovascular complaints at this time.  She does maintain vitamin D weekly for severe vitamin D deficiency  06/27/2020 This patient was seen by way of a video visit today.  She has been noting increased abdominal bloating and increased wheezing cough some degree of shortness of breath She has increased rhinorrhea.  She will need a Pneumovax and also flu shot as well as Covid booster shot She has a home blood pressure monitor her blood pressure has been running 142/79 in the home  1/31: Patient is seen in return visit today by way of  a telephone visit.  Her largest complaint is severe anxiety when she is showering.  She takes 2.5 mg Klonopin only before shower and states this is not sufficient for her.  She also states she is only taking the inhaler once a day but it is prescribed twice a day.  Patient denies any chest pain increased mucus or other complaints at this time.  Patient is interested in getting a shower chair that may fit her bathtub more effectively.  Patient is due a flu shot and Pneumovax and she is already received her Covid booster  11/14/20 This patient states her shortness of breath is progressively getting worse she is maintaining inhaled medications.  The patient also complains of abdominal distention early satiety gaseous bloating.  She has significant depression and has is seeking some type of mental health provider for counseling.  She is not been using simethicone as prescribed.  She does not have a functional flutter valve at this time.  Patient is due a Pneumovax Prevnar 13 Patient is using her portable indigent oxygen system.  6.6.22 Patient seen in return follow-up states her dyspnea is quite severe with minimal  exertion she is on 4-4 and half liters oxygen today she reports a saturation of 98% on her home oxygen meter with pulse 115.  She states the abdominal gas is the same she is concerned about abdominal hernia activity. Patient is compliant with her inhaled medicines and her new pharmacy is providing packaged medications that she can take them all at once and distinct packaging for daily dosing  03/14/2021 Home visit This patient was seen in her home at this visit.  I was accompanied by my PA student Thurmond Butts. The patient's daughter [name] was present as well.  We conducted the visit in the patient's bedroom.  She was sitting in the bed in no acute distress on nasal oxygen 4 L.  She states her breathing is gotten more labored over the summer.  She is coughing more nonproductively.  She is only on the bevspri inhaler which is glycopyrrolate and a long-acting beta agonist combination.  She uses her nebulizer only as needed she does have the handheld albuterol.  She is not using any spacer device but she has one that she could use.  She states the back of her throat does become irritated at times by using the inhalers directly.  I had her demonstrate how to use inhalers using the spacer device she did this successfully and she is encouraged to use a spacer device  She denies any pedal edema.  She is not ambulatory.  Her rolling walker has not been used.  It was up inside a closet in the hallway.  She is questioning whether she could have a larger shower stool.  In inspecting the shower the stool is quite small however the bathtub is very small as well not sure a larger stool could fit into the small bathtub  They have been changing the HVAC filters  Patient does get anxious at times she does have low-dose clonidine as needed    Shortness of Breath This is a chronic problem. The current episode started more than 1 year ago. The problem occurs daily. The problem has been gradually worsening. Associated  symptoms include chest pain, headaches, leg swelling, rhinorrhea, a sore throat and wheezing. Pertinent negatives include no abdominal pain, ear pain, fever, hemoptysis, leg pain, PND, sputum production or vomiting. The symptoms are aggravated by lying flat and exercise. She has tried beta agonist  inhalers, ipratropium inhalers and oral steroids for the symptoms. The treatment provided significant relief. Her past medical history is significant for COPD.   Past Medical History:  Diagnosis Date   Arthritis    Asthma    Cancer ([place])    Chronic respiratory failure with hypoxia and hypercapnia (HCC)    COPD (chronic obstructive pulmonary disease) ([place])    Hypertension      Family History  Problem Relation Age of Onset   Hypertension Mother    Heart disease Mother    Cancer Mother    Asthma Father    Asthma Sister    Hypertension Sister    Asthma Brother    Cancer Brother      Social History   Socioeconomic History   Marital status: Single    Spouse name: Not on file   Number of children: Not on file   Years of education: Not on file   Highest education level: Not on file  Occupational History   Not on file  Tobacco Use   Smoking status: Former   Smokeless tobacco: Never   Tobacco comments:    quit smoking in 2018    Vaping Use   Vaping Use: Never used  Substance and Sexual Activity   Alcohol use: Not Currently   Drug use: Not Currently   Sexual activity: Not on file  Other Topics Concern   Not on file  Social History Narrative   Not on file   Social Determinants of Health   Financial Resource Strain: Not on file  Food Insecurity: Not on file  Transportation Needs: Not on file  Physical Activity: Not on file  Stress: Not on file  Social Connections: Not on file  Intimate Partner Violence: Not on file     No Known Allergies   Outpatient Medications Prior to Visit  Medication Sig Dispense Refill   albuterol (PROVENTIL) (2.5 MG/3ML) 0.083% nebulizer solution  Take 3 mLs (2.5 mg total) by nebulization 3 (three) times daily. 270 mL 1   albuterol (VENTOLIN HFA) 108 (90 Base) MCG/ACT inhaler INHALE TWO PUFFS BY MOUTH EVERY SIX HOURS AS NEEDED FOR WHEEZING OR SHORTNESS OF BREATH 18 g 1   amLODipine (NORVASC) 5 MG tablet TAKE 1 TABLET(5 MG) BY MOUTH DAILY 90 tablet 1   azelastine (ASTELIN) 0.1 % nasal spray Place 2 sprays into both nostrils 2 (two) times daily. Use in each nostril as directed 30 mL 12   Blood Pressure Monitoring (BLOOD PRESSURE MONITOR AUTOMAT) DEVI Measure blood pressure and pulse daily 1 Device 0   clonazePAM (KLONOPIN) 0.5 MG tablet Take two tablets by mouth as needed for anxiety 20 tablet 0   doxycycline (VIBRA-TABS) 100 MG tablet Take 1 tablet (100 mg total) by mouth 2 (two) times daily. 14 tablet 0   gabapentin (NEURONTIN) 100 MG capsule Take 1 capsule (100 mg total) by mouth 3 (three) times daily. 90 capsule 3   Glycopyrrolate-Formoterol (BEVESPI AEROSPHERE) 9-4.8 MCG/ACT AERO Inhale 2 puffs into the lungs in the morning and at bedtime. 10.7 g 6   ibuprofen (ADVIL) 600 MG tablet TAKE 1 TABLET(600 MG) BY MOUTH EVERY 6 HOURS AS NEEDED 90 tablet 0   loratadine (ALLERGY RELIEF) 10 MG tablet TAKE ONE TABLET BY MOUTH ONCE DAILY (AM) 30 tablet 3   losartan (COZAAR) 100 MG tablet Take 1 tablet (100 mg total) by mouth daily. 90 tablet 2   Misc. Devices (PULSE OXIMETER FOR FINGER) MISC 1 application by Does not apply route daily  as needed. 1 each 0   Misc. Devices MISC Nebulizer machine, Large shower chair.  Diagnosis- chronic respiratory failure 1 each 0   pantoprazole (PROTONIX) 40 MG tablet Take 1 tablet (40 mg total) by mouth 2 (two) times daily before a meal. 60 tablet 3   polyethylene glycol powder (GLYCOLAX/MIRALAX) 17 GM/SCOOP powder Take 17 g by mouth daily as needed. 507 g 1   promethazine-dextromethorphan (PROMETHAZINE-DM) 6.25-15 MG/5ML syrup TAKE 5 MLS BY MOUTH 4 (FOUR) TIMES DAILY AS NEEDED FOR COUGH. 240 mL 0   Simethicone 125 MG  CAPS Take 2 capsules (250 mg total) by mouth 3 (three) times daily with meals. 180 capsule 0   Spacer/Aero-Holding Chambers (AEROCHAMBER MV) inhaler Use as instructed 1 each 0   Vitamin D, Ergocalciferol, (DRISDOL) 1.25 MG (50000 UNIT) CAPS capsule Take 1 capsule (50,000 Units total) by mouth every 7 (seven) days. 12 capsule 1   No facility-administered medications prior to visit.     Review of Systems  Constitutional:  Positive for activity change and fatigue. Negative for fever.  HENT:  Positive for rhinorrhea and sore throat. Negative for ear pain.   Eyes: Negative.   Respiratory:  Positive for cough, chest tightness, shortness of breath and wheezing. Negative for hemoptysis and sputum production.   Cardiovascular:  Positive for chest pain and leg swelling. Negative for palpitations and PND.  Gastrointestinal: Negative.  Negative for abdominal pain and vomiting.  Endocrine: Negative.   Genitourinary: Negative.   Musculoskeletal: Negative.   Skin: Negative.   Allergic/Immunologic: Negative.   Neurological:  Positive for headaches.  Hematological: Negative.   Psychiatric/Behavioral: Negative.        Objective:   Physical Exam Blood pressure is 155/100 however prior blood pressures in her meter are 120/70 Oxygen saturation is 93% on 4 L pulse is 120   Gen: Pleasant, well-nourished, in no distress,  normal affect  ENT: No lesions,  mouth clear,  oropharynx clear, no postnasal drip  Neck: No JVD, no TMG, no carotid bruits  Lungs: No use of accessory muscles, no dullness to percussion, distant breath sounds few expiratory wheezes  Cardiovascular: RRR, heart sounds normal, no murmur or gallops, no peripheral edema  Abdomen: soft and NT, no HSM,  BS normal  Musculoskeletal: No deformities, no cyanosis or clubbing  Neuro: alert, non focal  Skin: Warm, no lesions or rashes  No results found.  BMP Latest Ref Rng & Units 11/04/2018 11/03/2018 10/28/2018  Glucose 70 - 99 mg/dL  184(H) 165(H) -  BUN 8 - 23 mg/dL 19 19 -  Creatinine 0.44 - 1.00 mg/dL 0.80 0.81 -  Sodium 135 - 145 mmol/L 138 135 139  Potassium 3.5 - 5.1 mmol/L 4.2 3.9 3.3(L)  Chloride 98 - 111 mmol/L 98 99 -  CO2 22 - 32 mmol/L 33(H) 26 -  Calcium 8.9 - 10.3 mg/dL 9.1 9.0 -   CBC Latest Ref Rng & Units 11/04/2018 10/28/2018 10/28/2018  WBC 4.0 - 10.5 K/uL 13.6(H) - 7.1  Hemoglobin 12.0 - 15.0 g/dL 10.8(L) 11.9(L) 11.9(L)  Hematocrit 36.0 - 46.0 % 35.1(L) 35.0(L) 38.5  Platelets 150 - 400 K/uL 308 - 343  CXR:  3/18 IMPRESSION: No acute abnormality seen. ABG  FIO2 1.0:    Component Value Date/Time   PHART 7.320 (L) 10/28/2018 0255   PCO2ART 62.8 (H) 10/28/2018 0255   PO2ART 196.0 (H) 10/28/2018 0255   HCO3 32.3 (H) 10/28/2018 0255   TCO2 34 (H) 10/28/2018 0255   O2SAT 100.0 10/28/2018 0255  Pulmonary functions have been obtained earlier in 2019 by her local pulmonologist in [facility].  This revealed an FEV1 of 25% predicted and diffusion capacity of 20% predicted.  This places the patient in the Gold stage D COPD category with significant advanced emphysema.     Assessment & Plan:  I personally reviewed all images and lab data in the [facility] system as well as any outside material available during this office visit and agree with the  radiology impressions.   COPD GOLD D (chronic obstructive pulmonary disease) ([place]) Plan here is to switch to the brevstri inhaler which is an inhaled steroid, glycopyrrolate, long-acting bronchodilator 2 puffs twice daily  She cannot tolerate oral prednisone  Will give doxycycline twice daily for 7 days  Increase clonidine to 3 times daily as needed  Patient instructed to use her spacer device  Patient instructed to use the rolling walker we set it up and deployed it in her bedroom so she can use it to get to the bathroom and into the kitchen  I do not believe the shower stool can be any larger based on the size of the bathtub it is currently sitting  in  Return visit in 1 to 2 months we will try to make this a home visit  Patient needs to get a Prevnar 20 vaccine, I will see if there is any way that this could occur with a home visit with a nurse   Diagnoses and all orders for this visit:  Mixed simple and mucopurulent chronic bronchitis ([place])   Spent 63mnutes on this housecall with face-to-face time reviewing the chart complex medical decision making patient education

## 2021-03-15 ENCOUNTER — Telehealth: Payer: Self-pay

## 2021-03-15 ENCOUNTER — Other Ambulatory Visit: Payer: Self-pay | Admitting: Critical Care Medicine

## 2021-03-15 ENCOUNTER — Encounter: Payer: Self-pay | Admitting: Critical Care Medicine

## 2021-03-15 DIAGNOSIS — J9611 Chronic respiratory failure with hypoxia: Secondary | ICD-10-CM

## 2021-03-15 DIAGNOSIS — J418 Mixed simple and mucopurulent chronic bronchitis: Secondary | ICD-10-CM

## 2021-03-15 MED ORDER — BREZTRI AEROSPHERE 160-9-4.8 MCG/ACT IN AERO: 2.0000 | INHALATION_SPRAY | Freq: Two times a day (BID) | RESPIRATORY_TRACT | 6 refills | Status: DC

## 2021-03-15 MED ORDER — DOXYCYCLINE HYCLATE 100 MG PO TABS: 100.0000 mg | ORAL_TABLET | Freq: Two times a day (BID) | ORAL | 0 refills | Status: DC

## 2021-03-15 MED ORDER — CLONAZEPAM 0.5 MG PO TABS: 0.5000 mg | ORAL_TABLET | Freq: Three times a day (TID) | ORAL | 0 refills | Status: DC | PRN

## 2021-03-15 NOTE — Telephone Encounter (Signed)
Order received for tub transfer bench.  Call placed to patient and she was in agreement to having the order faxed to [facility].  She had no preference for DME company  Order then faxed as requested.

## 2021-03-15 NOTE — Assessment & Plan Note (Signed)
Plan here is to switch to the brevstri inhaler which is an inhaled steroid, glycopyrrolate, long-acting bronchodilator 2 puffs twice daily  She cannot tolerate oral prednisone  Will give doxycycline twice daily for 7 days  Increase clonidine to 3 times daily as needed  Patient instructed to use her spacer device  Patient instructed to use the rolling walker we set it up and deployed it in her bedroom so she can use it to get to the bathroom and into the kitchen  I do not believe the shower stool can be any larger based on the size of the bathtub it is currently sitting in  Return visit in 1 to 2 months we will try to make this a home visit  Patient needs to get a Prevnar 20 vaccine, I will see if there is any way that this could occur with a home visit with a nurse

## 2021-03-16 ENCOUNTER — Telehealth: Payer: Self-pay

## 2021-03-16 NOTE — Addendum Note (Signed)
Addended by: Carilyn Goodpasture on: 03/16/2021 02:26 PM   Modules accepted: Orders

## 2021-03-16 NOTE — Telephone Encounter (Signed)
This CM spoke to [employer] regarding PT referral.  She stated that they are able to accept the referral with start of care next week - 03/20/2021.

## 2021-03-16 NOTE — Telephone Encounter (Signed)
Patient states she thinks she missed a call from [facility] and when she calls back the # she is unable to get through. Patient seeking advise on how to connect with [facility]. Please call patient at [phone]

## 2021-03-16 NOTE — Telephone Encounter (Signed)
Call returned to patient and explained to her that [facility] accepted the referral for home PT.  They should be calling her back tomorrow or the beginning of next week to schedule an appointment to start care.  Instructed her to call this CM back if she has not heard from them by Tuesday 03/21/2021 and she said she would

## 2021-03-22 ENCOUNTER — Other Ambulatory Visit (HOSPITAL_COMMUNITY): Payer: Self-pay

## 2021-03-22 ENCOUNTER — Telehealth: Payer: Self-pay | Admitting: Critical Care Medicine

## 2021-03-22 NOTE — Telephone Encounter (Signed)
Pt is requesting phone call to discuss recent home visit.

## 2021-03-22 NOTE — Progress Notes (Signed)
Paramedicine Encounter    Patient ID: Bianca Murray, female    DOB: [birth year], 65 y.o.   MRN: [id number]   Patient Care Team: Elsie Stain, MD as PCP - General (Pulmonary Disease)  Patient Active Problem List   Diagnosis Date Noted   Chronic anxiety 09/12/2020   Generalized anxiety disorder 09/12/2020   GERD (gastroesophageal reflux disease) 03/22/2020   Abdominal bloating 12/10/2019   Hypertension 12/03/2018   Centrilobular emphysema ([place]) 12/03/2018   Vitamin D deficiency 11/27/2018   Chronic respiratory failure with hypoxia and hypercapnia (HCC)    COPD GOLD D (chronic obstructive pulmonary disease) ([place]) 10/28/2018    Current Outpatient Medications:    albuterol (PROVENTIL) (2.5 MG/3ML) 0.083% nebulizer solution, Take 3 mLs (2.5 mg total) by nebulization 3 (three) times daily., Disp: 270 mL, Rfl: 1   albuterol (VENTOLIN HFA) 108 (90 Base) MCG/ACT inhaler, INHALE TWO PUFFS BY MOUTH EVERY SIX HOURS AS NEEDED FOR WHEEZING OR SHORTNESS OF BREATH, Disp: 18 g, Rfl: 1   amLODipine (NORVASC) 5 MG tablet, TAKE 1 TABLET(5 MG) BY MOUTH DAILY, Disp: 90 tablet, Rfl: 1   azelastine (ASTELIN) 0.1 % nasal spray, Place 2 sprays into both nostrils 2 (two) times daily. Use in each nostril as directed, Disp: 30 mL, Rfl: 12   Blood Pressure Monitoring (BLOOD PRESSURE MONITOR AUTOMAT) DEVI, Measure blood pressure and pulse daily, Disp: 1 Device, Rfl: 0   Budeson-Glycopyrrol-Formoterol (BREZTRI AEROSPHERE) 160-9-4.8 MCG/ACT AERO, Inhale 2 puffs into the lungs in the morning and at bedtime., Disp: 10.7 g, Rfl: 6   clonazePAM (KLONOPIN) 0.5 MG tablet, Take 1 tablet (0.5 mg total) by mouth 3 (three) times daily as needed for anxiety (severe breathlessness). Take two tablets by mouth as needed for anxiety, Disp: 60 tablet, Rfl: 0   doxycycline (VIBRA-TABS) 100 MG tablet, Take 1 tablet (100 mg total) by mouth 2 (two) times daily., Disp: 14 tablet, Rfl: 0   gabapentin (NEURONTIN) 100 MG capsule, Take 1  capsule (100 mg total) by mouth 3 (three) times daily., Disp: 90 capsule, Rfl: 3   ibuprofen (ADVIL) 600 MG tablet, TAKE 1 TABLET(600 MG) BY MOUTH EVERY 6 HOURS AS NEEDED, Disp: 90 tablet, Rfl: 0   loratadine (ALLERGY RELIEF) 10 MG tablet, TAKE ONE TABLET BY MOUTH ONCE DAILY (AM), Disp: 30 tablet, Rfl: 3   losartan (COZAAR) 100 MG tablet, Take 1 tablet (100 mg total) by mouth daily., Disp: 90 tablet, Rfl: 2   Misc. Devices (PULSE OXIMETER FOR FINGER) MISC, 1 application by Does not apply route daily as needed., Disp: 1 each, Rfl: 0   Misc. Devices MISC, Nebulizer machine, Large shower chair.  Diagnosis- chronic respiratory failure, Disp: 1 each, Rfl: 0   pantoprazole (PROTONIX) 40 MG tablet, Take 1 tablet (40 mg total) by mouth 2 (two) times daily before a meal., Disp: 60 tablet, Rfl: 3   polyethylene glycol powder (GLYCOLAX/MIRALAX) 17 GM/SCOOP powder, Take 17 g by mouth daily as needed., Disp: 507 g, Rfl: 1   promethazine-dextromethorphan (PROMETHAZINE-DM) 6.25-15 MG/5ML syrup, TAKE 5 MLS BY MOUTH 4 (FOUR) TIMES DAILY AS NEEDED FOR COUGH., Disp: 240 mL, Rfl: 0   Simethicone 125 MG CAPS, Take 2 capsules (250 mg total) by mouth 3 (three) times daily with meals., Disp: 180 capsule, Rfl: 0   Spacer/Aero-Holding Chambers (AEROCHAMBER MV) inhaler, Use as instructed, Disp: 1 each, Rfl: 0   Vitamin D, Ergocalciferol, (DRISDOL) 1.25 MG (50000 UNIT) CAPS capsule, Take 1 capsule (50,000 Units total) by mouth every 7 (seven) days.,  Disp: 12 capsule, Rfl: 1 No Known Allergies   Social History   Socioeconomic History   Marital status: Single    Spouse name: Not on file   Number of children: Not on file   Years of education: Not on file   Highest education level: Not on file  Occupational History   Not on file  Tobacco Use   Smoking status: Former   Smokeless tobacco: Never   Tobacco comments:    quit smoking in 2018    Vaping Use   Vaping Use: Never used  Substance and Sexual Activity   Alcohol  use: Not Currently   Drug use: Not Currently   Sexual activity: Not on file  Other Topics Concern   Not on file  Social History Narrative   Not on file   Social Determinants of Health   Financial Resource Strain: Not on file  Food Insecurity: Not on file  Transportation Needs: Not on file  Physical Activity: Not on file  Stress: Not on file  Social Connections: Not on file  Intimate Partner Violence: Not on file    Physical Exam Vitals reviewed.  Constitutional:      Appearance: Normal appearance. She is normal weight.  HENT:     Nose: Nose normal.     Mouth/Throat:     Mouth: Mucous membranes are moist.     Pharynx: Oropharynx is clear.  Eyes:     Conjunctiva/sclera: Conjunctivae normal.     Pupils: Pupils are equal, round, and reactive to light.  Cardiovascular:     Rate and Rhythm: Normal rate and regular rhythm.     Pulses: Normal pulses.     Heart sounds: Normal heart sounds.  Pulmonary:     Effort: Pulmonary effort is normal.     Breath sounds: Normal breath sounds.  Abdominal:     General: There is distension.     Hernia: A hernia is present.  Musculoskeletal:        General: Normal range of motion.     Cervical back: Normal range of motion.  Skin:    General: Skin is warm and dry.     Capillary Refill: Capillary refill takes less than 2 seconds.  Neurological:     General: No focal deficit present.     Mental Status: She is alert. Mental status is at baseline.  Psychiatric:        Mood and Affect: Mood normal.   Arrived for home visit for [facility] who was alert and oriented reporting to be feeling good. Bianca Murray denied chest pain, increased shortness of breath, dizziness, trouble sleeping or taking her medications. Bianca Murray reports she has been more upset and depressed lately and this is related to her living situation, she states her daughter and her argue a lot. I printed a list of income based to low income housing options for her to seek out. She was  grateful for this. I reviewed medications and upcoming appointments. Vitals and assessment as noted. I completed paperwork for her [facility] Appointment for next week and wrote down reminders as well as putting an alarm in her phone and texting her daughter. Gwendoline was advised to reach out if she needed anything further. She was grateful for our visit.   Lissett inquired about a new BP medication and I did not see one noted on her chart. I do see increased anxiety medication and she knew about that. She said she would reach out to Dr. Joya Gaskins.  I also shared information about [place] with her as she is seeking options for life alert.   I will be getting order to complete Prevnar vaccine for her in the coming days.   Home visit complete.   Refills: NONE        No future appointments.   ACTION: Home visit completed

## 2021-03-22 NOTE — Telephone Encounter (Signed)
Copied from [place] [id number]. Topic: General - Other >> Mar 16, 2021  9:48 AM Yvette Rack wrote: Reason for CRM: Pt stated she had a home visit with Dr. Joya Gaskins yesterday and she would like a return call to discuss the allergy and blood pressure medications. Pt stated she needs more than the 1 allergy pill daily and she was told that her blood pressure medication would be adjusted so she needs to know if that was done as well. Pt requests call back. Cb# [phone]

## 2021-03-23 NOTE — Telephone Encounter (Signed)
Pt told to use astelin daily.     No change in BP meds.

## 2021-03-28 ENCOUNTER — Other Ambulatory Visit: Payer: Self-pay | Admitting: Critical Care Medicine

## 2021-03-28 ENCOUNTER — Telehealth: Payer: Self-pay | Admitting: Critical Care Medicine

## 2021-03-28 NOTE — Telephone Encounter (Signed)
Copied from [place] [id number]. Topic: General - Other >> Mar 27, 2021  8:28 AM Oneta Rack wrote: Caller name: Edwena Blow Relation to pt: from [place]  Call back number: [phone] fax # [phone]    Reason for call: verbal orders for incontinence supplies >> Mar 28, 2021 10:31 AM Jodie Echevaria wrote: Edwena Blow with [employer] called in to check status of form faxed on 03/23/21. Please advise information in previous message    All form from 03/23/21 were place in PCP box.

## 2021-03-29 NOTE — Telephone Encounter (Signed)
Paperwork has been faxed over to company on 03/29/2021

## 2021-03-31 ENCOUNTER — Other Ambulatory Visit: Payer: Self-pay | Admitting: Critical Care Medicine

## 2021-03-31 NOTE — Telephone Encounter (Signed)
Requested medications are due for refill today yes  Requested medications are on the active medication list yes  Last refill 6/9  Last visit video visit 8/2  Future visit scheduled no  Notes to clinic unclear if pt is to be on this continuously. Please assess.

## 2021-04-25 ENCOUNTER — Other Ambulatory Visit: Payer: Self-pay | Admitting: *Deleted

## 2021-04-25 DIAGNOSIS — Z23 Encounter for immunization: Secondary | ICD-10-CM

## 2021-04-27 ENCOUNTER — Telehealth: Payer: Medicare Other | Admitting: Critical Care Medicine

## 2021-04-27 DIAGNOSIS — J9611 Chronic respiratory failure with hypoxia: Secondary | ICD-10-CM

## 2021-04-27 DIAGNOSIS — J418 Mixed simple and mucopurulent chronic bronchitis: Secondary | ICD-10-CM

## 2021-04-27 NOTE — Telephone Encounter (Signed)
Patient called asking to speak with nurse, wouldn't say what it is about.

## 2021-04-27 NOTE — Telephone Encounter (Signed)
Patient wanted to inquire about being a recipient for a lung transplant.   Would like a be prescribed power chair or scooter so she could get out the house and do ADL and daily task.   Would like a referral to speak to LCSW, therapist, or Behavioral Health. She gets depressed at times thinking about COPD.  Scheduled a f/u appt. for  05/08/2021 at 2:50.

## 2021-04-28 NOTE — Telephone Encounter (Signed)
Spoke with pt and scheduled appt for 05/17/21. Pt also expressed her urgent need for a scooter.

## 2021-04-29 ENCOUNTER — Other Ambulatory Visit: Payer: Self-pay | Admitting: Critical Care Medicine

## 2021-04-30 NOTE — Telephone Encounter (Signed)
Requested Prescriptions  Pending Prescriptions Disp Refills  . gabapentin (NEURONTIN) 100 MG capsule [Pharmacy Med Name: GABAPENTIN 100 MG ORAL CAPSULE] 90 capsule 0    Sig: TAKE 1 CAPSULE (100 MG TOTAL) BY MOUTH 3 (THREE) TIMES DAILY.     Neurology: Anticonvulsants - gabapentin Passed - 04/29/2021 12:12 PM      Passed - Valid encounter within last 12 months    Recent Outpatient Visits          1 month ago Need for vaccination for Strep pneumoniae   [place] Elsie Stain, MD   3 months ago Mixed simple and mucopurulent chronic bronchitis [facility])   [place] Elsie Stain, MD   5 months ago Centrilobular emphysema [facility])   [place] Elsie Stain, MD   7 months ago Primary hypertension   [place], MD   10 months ago Centrilobular emphysema [facility])   [place], MD      Future Appointments            In 1 week Elsie Stain, MD [place]

## 2021-05-03 ENCOUNTER — Telehealth (HOSPITAL_COMMUNITY): Payer: Self-pay

## 2021-05-03 ENCOUNTER — Other Ambulatory Visit: Payer: Self-pay

## 2021-05-03 NOTE — Progress Notes (Signed)
Paramedicine Encounter    Patient ID: Bianca Murray, female    DOB: 11-Apr-1956, 65 y.o.   MRN: [id number]   Patient Care Team: Elsie Stain, MD as PCP - General (Pulmonary Disease)  Patient Active Problem List   Diagnosis Date Noted   Chronic anxiety 09/12/2020   Generalized anxiety disorder 09/12/2020   GERD (gastroesophageal reflux disease) 03/22/2020   Abdominal bloating 12/10/2019   Hypertension 12/03/2018   Centrilobular emphysema ([place]) 12/03/2018   Vitamin D deficiency 11/27/2018   Chronic respiratory failure with hypoxia and hypercapnia (HCC)    COPD GOLD D (chronic obstructive pulmonary disease) ([place]) 10/28/2018    Current Outpatient Medications:    albuterol (PROVENTIL) (2.5 MG/3ML) 0.083% nebulizer solution, Take 3 mLs (2.5 mg total) by nebulization 3 (three) times daily., Disp: 270 mL, Rfl: 1   albuterol (VENTOLIN HFA) 108 (90 Base) MCG/ACT inhaler, INHALE TWO PUFFS BY MOUTH EVERY SIX HOURS AS NEEDED FOR WHEEZING OR SHORTNESS OF BREATH, Disp: 18 g, Rfl: 1   amLODipine (NORVASC) 5 MG tablet, TAKE 1 TABLET(5 MG) BY MOUTH DAILY, Disp: 90 tablet, Rfl: 1   azelastine (ASTELIN) 0.1 % nasal spray, Place 2 sprays into both nostrils 2 (two) times daily. Use in each nostril as directed, Disp: 30 mL, Rfl: 12   Blood Pressure Monitoring (BLOOD PRESSURE MONITOR AUTOMAT) DEVI, Measure blood pressure and pulse daily, Disp: 1 Device, Rfl: 0   Budeson-Glycopyrrol-Formoterol (BREZTRI AEROSPHERE) 160-9-4.8 MCG/ACT AERO, Inhale 2 puffs into the lungs in the morning and at bedtime., Disp: 10.7 g, Rfl: 6   clonazePAM (KLONOPIN) 0.5 MG tablet, Take 1 tablet (0.5 mg total) by mouth 3 (three) times daily as needed for anxiety (severe breathlessness). Take two tablets by mouth as needed for anxiety, Disp: 60 tablet, Rfl: 0   doxycycline (VIBRA-TABS) 100 MG tablet, Take 1 tablet (100 mg total) by mouth 2 (two) times daily., Disp: 14 tablet, Rfl: 0   gabapentin (NEURONTIN) 100 MG capsule, TAKE 1  CAPSULE (100 MG TOTAL) BY MOUTH 3 (THREE) TIMES DAILY., Disp: 90 capsule, Rfl: 0   ibuprofen (ADVIL) 600 MG tablet, TAKE 1 TABLET(600 MG) BY MOUTH EVERY 6 HOURS AS NEEDED, Disp: 90 tablet, Rfl: 0   loratadine (ALLERGY RELIEF) 10 MG tablet, TAKE ONE TABLET BY MOUTH ONCE DAILY (AM), Disp: 90 tablet, Rfl: 0   losartan (COZAAR) 100 MG tablet, Take 1 tablet (100 mg total) by mouth daily., Disp: 90 tablet, Rfl: 2   Misc. Devices (PULSE OXIMETER FOR FINGER) MISC, 1 application by Does not apply route daily as needed., Disp: 1 each, Rfl: 0   Misc. Devices MISC, Nebulizer machine, Large shower chair.  Diagnosis- chronic respiratory failure, Disp: 1 each, Rfl: 0   pantoprazole (PROTONIX) 40 MG tablet, Take 1 tablet (40 mg total) by mouth 2 (two) times daily before a meal., Disp: 60 tablet, Rfl: 3   polyethylene glycol powder (GLYCOLAX/MIRALAX) 17 GM/SCOOP powder, Take 17 g by mouth daily as needed., Disp: 507 g, Rfl: 1   promethazine-dextromethorphan (PROMETHAZINE-DM) 6.25-15 MG/5ML syrup, TAKE 5 MLS BY MOUTH 4 (FOUR) TIMES DAILY AS NEEDED FOR COUGH., Disp: 240 mL, Rfl: 0   Simethicone 125 MG CAPS, Take 2 capsules (250 mg total) by mouth 3 (three) times daily with meals., Disp: 180 capsule, Rfl: 0   Spacer/Aero-Holding Chambers (AEROCHAMBER MV) inhaler, Use as instructed, Disp: 1 each, Rfl: 0   Vitamin D, Ergocalciferol, (DRISDOL) 1.25 MG (50000 UNIT) CAPS capsule, Take 1 capsule (50,000 Units total) by mouth every 7 (seven) days.,  Disp: 12 capsule, Rfl: 1 No Known Allergies   Social History   Socioeconomic History   Marital status: Single    Spouse name: Not on file   Number of children: Not on file   Years of education: Not on file   Highest education level: Not on file  Occupational History   Not on file  Tobacco Use   Smoking status: Former   Smokeless tobacco: Never   Tobacco comments:    quit smoking in 2018    Vaping Use   Vaping Use: Never used  Substance and Sexual Activity   Alcohol  use: Not Currently   Drug use: Not Currently   Sexual activity: Not on file  Other Topics Concern   Not on file  Social History Narrative   Not on file   Social Determinants of Health   Financial Resource Strain: Not on file  Food Insecurity: Not on file  Transportation Needs: Not on file  Physical Activity: Not on file  Stress: Not on file  Social Connections: Not on file  Intimate Partner Violence: Not on file    Physical Exam Vitals reviewed.  Constitutional:      Appearance: Normal appearance. She is normal weight.  HENT:     Head: Normocephalic.     Nose: Nose normal.     Mouth/Throat:     Mouth: Mucous membranes are moist.     Pharynx: Oropharynx is clear.  Eyes:     Conjunctiva/sclera: Conjunctivae normal.     Pupils: Pupils are equal, round, and reactive to light.  Cardiovascular:     Rate and Rhythm: Normal rate and regular rhythm.     Pulses: Normal pulses.     Heart sounds: Normal heart sounds.  Pulmonary:     Effort: Pulmonary effort is normal.     Breath sounds: Normal breath sounds.  Abdominal:     General: There is distension.     Palpations: Abdomen is soft.  Musculoskeletal:        General: No swelling. Normal range of motion.     Cervical back: Normal range of motion.     Right lower leg: No edema.     Left lower leg: No edema.  Skin:    General: Skin is warm and dry.     Capillary Refill: Capillary refill takes less than 2 seconds.  Neurological:     General: No focal deficit present.     Mental Status: She is alert. Mental status is at baseline.  Psychiatric:        Mood and Affect: Mood normal.   Arrived for home visit for [facility] who reports she is feeling good stating she is feeling better emotionally this week. She reports she has been taking her medications in her bubble packs with no problems and has been utilizing her inhalers and home oxygen as well.   She continues to express the need for a psychiatrist for some mental health  concerns including depression about being stuck at home with no real outlets to a social life. I encouraged her to continue to plan to talk to Bianca Murray at [facility] and to seek out some senior resources for outside of the home activities once we determine if she can get a motorized scooter. She likes this plan.   Vitals as noted in report.  Today I administered her Prevnar vaccine after retrieving it from [facility] the pharmacist at [facility]. Order was placed by Dr. Joya Gaskins and Bianca Murray placed note into vaccinations area  on her chart.  Vaccine administered at 1250 and no redness, swelling or pain noted at the site. Patient was monitored for 20 mins after vaccine was given.    Bianca Murray reminded of upcoming appointments and home visit complete.   Refills- NONE   -Dr. Joya Gaskins visit for her on Monday at 3:00 for a [facility] visit. I will inquire if Flu vaccine can be done by me in the home.   Future Appointments  Date Time Provider [place]  05/08/2021  3:00 PM Elsie Stain, MD [facility] None  05/17/2021  9:30 AM McCoy, [name] C, Bianca Murray [facility] None     ACTION: Home visit completed

## 2021-05-03 NOTE — Telephone Encounter (Signed)
Bianca Murray inquiring about flu shot, I will forward this to Dr. Joya Gaskins about me assisting with administering this in the home. I will continue to follow up.

## 2021-05-04 NOTE — Telephone Encounter (Signed)
Yes pls provide a flu vaccine adding Opal Sidles

## 2021-05-05 ENCOUNTER — Other Ambulatory Visit: Payer: Self-pay | Admitting: *Deleted

## 2021-05-05 DIAGNOSIS — Z23 Encounter for immunization: Secondary | ICD-10-CM

## 2021-05-08 ENCOUNTER — Ambulatory Visit: Payer: Medicare Other | Attending: Critical Care Medicine | Admitting: Critical Care Medicine

## 2021-05-08 ENCOUNTER — Encounter: Payer: Self-pay | Admitting: Critical Care Medicine

## 2021-05-08 ENCOUNTER — Other Ambulatory Visit: Payer: Self-pay

## 2021-05-08 DIAGNOSIS — F419 Anxiety disorder, unspecified: Secondary | ICD-10-CM

## 2021-05-08 DIAGNOSIS — J9611 Chronic respiratory failure with hypoxia: Secondary | ICD-10-CM | POA: Diagnosis not present

## 2021-05-08 DIAGNOSIS — J418 Mixed simple and mucopurulent chronic bronchitis: Secondary | ICD-10-CM

## 2021-05-08 DIAGNOSIS — I10 Essential (primary) hypertension: Secondary | ICD-10-CM

## 2021-05-08 DIAGNOSIS — J9612 Chronic respiratory failure with hypercapnia: Secondary | ICD-10-CM

## 2021-05-08 NOTE — Telephone Encounter (Signed)
Your welcome.

## 2021-05-08 NOTE — Progress Notes (Signed)
Established Patient Office Visit  Subjective:  Patient ID: Bianca Murray, female    DOB: April 26, 1956  Age: 65 y.o. MRN: [id number] Virtual Visit via Telephone Note  I connected with Bianca Murray on 05/08/21 at  3:00 PM EDT by telephone and verified that I am speaking with the correct person using two identifiers.   Consent:  I discussed the limitations, risks, security and privacy concerns of performing an evaluation and management service by telephone and the availability of in person appointments. I also discussed with the patient that there may be a patient responsible charge related to this service. The patient expressed understanding and agreed to proceed.  Location of patient: Patient's at home  Location of provider: I am in my office  Persons participating in the televisit with the patient.       History of Presen   CC: PCP f/u OV   HPI Bianca Murray presents for primary care visit with a phone visit.  Patient had a lot of request at this visit.  She still interested in a motorized wheelchair we will need to coordinate with her daughter because there are several barriers to this.  Patient would like a medical alert button we will inquire about this.  She also wants a grabber to reach objects in her closet from a sitting position.  She would like another home physical therapy evaluation.  She would also like licensed clinical social work to call her again for behavioral health counseling over depression from being at home so much.  Breathing is at baseline.  She did receive her pneumonia vaccine she is due up a flu vaccine which she will receive in the home.  Past Medical History:  Diagnosis Date   Arthritis    Asthma    Cancer ([place])    Chronic respiratory failure with hypoxia and hypercapnia (HCC)    COPD (chronic obstructive pulmonary disease) (HCC)    Hypertension     Past Surgical History:  Procedure Laterality Date   ABDOMINAL HYSTERECTOMY     TRACHEOSTOMY      reversed   TRACHEOSTOMY CLOSURE      Family History  Problem Relation Age of Onset   Hypertension Mother    Heart disease Mother    Cancer Mother    Asthma Father    Asthma Sister    Hypertension Sister    Asthma Brother    Cancer Brother     Social History   Socioeconomic History   Marital status: Single    Spouse name: Not on file   Number of children: Not on file   Years of education: Not on file   Highest education level: Not on file  Occupational History   Not on file  Tobacco Use   Smoking status: Former   Smokeless tobacco: Never   Tobacco comments:    quit smoking in 2018    Vaping Use   Vaping Use: Never used  Substance and Sexual Activity   Alcohol use: Not Currently   Drug use: Not Currently   Sexual activity: Not on file  Other Topics Concern   Not on file  Social History Narrative   Not on file   Social Determinants of Health   Financial Resource Strain: Not on file  Food Insecurity: Not on file  Transportation Needs: Not on file  Physical Activity: Not on file  Stress: Not on file  Social Connections: Not on file  Intimate Partner Violence: Not on file    Outpatient  Medications Prior to Visit  Medication Sig Dispense Refill   albuterol (PROVENTIL) (2.5 MG/3ML) 0.083% nebulizer solution Take 3 mLs (2.5 mg total) by nebulization 3 (three) times daily. 270 mL 1   albuterol (VENTOLIN HFA) 108 (90 Base) MCG/ACT inhaler INHALE TWO PUFFS BY MOUTH EVERY SIX HOURS AS NEEDED FOR WHEEZING OR SHORTNESS OF BREATH 18 g 1   amLODipine (NORVASC) 5 MG tablet TAKE 1 TABLET(5 MG) BY MOUTH DAILY 90 tablet 1   azelastine (ASTELIN) 0.1 % nasal spray Place 2 sprays into both nostrils 2 (two) times daily. Use in each nostril as directed 30 mL 12   Blood Pressure Monitoring (BLOOD PRESSURE MONITOR AUTOMAT) DEVI Measure blood pressure and pulse daily 1 Device 0   Budeson-Glycopyrrol-Formoterol (BREZTRI AEROSPHERE) 160-9-4.8 MCG/ACT AERO Inhale 2 puffs into the lungs in  the morning and at bedtime. 10.7 g 6   clonazePAM (KLONOPIN) 0.5 MG tablet Take 1 tablet (0.5 mg total) by mouth 3 (three) times daily as needed for anxiety (severe breathlessness). Take two tablets by mouth as needed for anxiety 60 tablet 0   gabapentin (NEURONTIN) 100 MG capsule TAKE 1 CAPSULE (100 MG TOTAL) BY MOUTH 3 (THREE) TIMES DAILY. 90 capsule 0   ibuprofen (ADVIL) 600 MG tablet TAKE 1 TABLET(600 MG) BY MOUTH EVERY 6 HOURS AS NEEDED 90 tablet 0   loratadine (ALLERGY RELIEF) 10 MG tablet TAKE ONE TABLET BY MOUTH ONCE DAILY (AM) 90 tablet 0   losartan (COZAAR) 100 MG tablet Take 1 tablet (100 mg total) by mouth daily. 90 tablet 2   Misc. Devices (PULSE OXIMETER FOR FINGER) MISC 1 application by Does not apply route daily as needed. 1 each 0   Misc. Devices MISC Nebulizer machine, Large shower chair.  Diagnosis- chronic respiratory failure 1 each 0   pantoprazole (PROTONIX) 40 MG tablet Take 1 tablet (40 mg total) by mouth 2 (two) times daily before a meal. 60 tablet 3   polyethylene glycol powder (GLYCOLAX/MIRALAX) 17 GM/SCOOP powder Take 17 g by mouth daily as needed. 507 g 1   promethazine-dextromethorphan (PROMETHAZINE-DM) 6.25-15 MG/5ML syrup TAKE 5 MLS BY MOUTH 4 (FOUR) TIMES DAILY AS NEEDED FOR COUGH. 240 mL 0   Simethicone 125 MG CAPS Take 2 capsules (250 mg total) by mouth 3 (three) times daily with meals. 180 capsule 0   Spacer/Aero-Holding Chambers (AEROCHAMBER MV) inhaler Use as instructed 1 each 0   Vitamin D, Ergocalciferol, (DRISDOL) 1.25 MG (50000 UNIT) CAPS capsule Take 1 capsule (50,000 Units total) by mouth every 7 (seven) days. 12 capsule 1   doxycycline (VIBRA-TABS) 100 MG tablet Take 1 tablet (100 mg total) by mouth 2 (two) times daily. 14 tablet 0   No facility-administered medications prior to visit.    No Known Allergies  ROS Review of Systems  Constitutional: Negative.   HENT: Negative.  Negative for ear pain, postnasal drip, rhinorrhea, sinus pressure, sore  throat, trouble swallowing and voice change.   Eyes: Negative.   Respiratory:  Positive for cough and shortness of breath. Negative for apnea, choking, chest tightness, wheezing and stridor.   Cardiovascular: Negative.  Negative for chest pain, palpitations and leg swelling.  Gastrointestinal: Negative.  Negative for abdominal distention, abdominal pain, nausea and vomiting.  Genitourinary: Negative.   Musculoskeletal:  Positive for gait problem. Negative for arthralgias and myalgias.  Skin: Negative.  Negative for rash.  Allergic/Immunologic: Negative.  Negative for environmental allergies and food allergies.  Neurological:  Negative for dizziness, syncope, weakness and headaches.  Hematological:  Negative.  Negative for adenopathy. Does not bruise/bleed easily.  Psychiatric/Behavioral:  Positive for dysphoric mood. Negative for agitation and sleep disturbance. The patient is not nervous/anxious.      Objective:    Physical Exam No exam this is a telephone visit There were no vitals taken for this visit. Wt Readings from Last 3 Encounters:  01/26/21 225 lb (102.1 kg)  12/21/20 224 lb (101.6 kg)  11/14/20 220 lb 3.2 oz (99.9 kg)     Health Maintenance Due  Topic Date Due   Zoster Vaccines- Shingrix (1 of 2) Never done   COVID-19 Vaccine (4 - Booster for Moderna series) 11/24/2020   INFLUENZA VACCINE  Never done    There are no preventive care reminders to display for this patient.  No results found for: TSH Lab Results  Component Value Date   WBC 13.6 (H) 11/04/2018   HGB 10.8 (L) 11/04/2018   HCT 35.1 (L) 11/04/2018   MCV 90.9 11/04/2018   PLT 308 11/04/2018   Lab Results  Component Value Date   NA 138 11/04/2018   K 4.2 11/04/2018   CO2 33 (H) 11/04/2018   GLUCOSE 184 (H) 11/04/2018   BUN 19 11/04/2018   CREATININE 0.80 11/04/2018   BILITOT 0.3 10/28/2018   ALKPHOS 70 10/28/2018   AST 17 10/28/2018   ALT 10 10/28/2018   PROT 6.9 10/28/2018   ALBUMIN 3.5  10/28/2018   CALCIUM 9.1 11/04/2018   ANIONGAP 7 11/04/2018   No results found for: CHOL No results found for: HDL No results found for: LDLCALC No results found for: TRIG No results found for: CHOLHDL No results found for: HGBA1C    Assessment & Plan:   Problem List Items Addressed This Visit       Cardiovascular and Mediastinum   Hypertension    Blood pressure under good control        Respiratory   COPD GOLD D (chronic obstructive pulmonary disease) (HCC)    Severe COPD Gold stage D end-stage oxygen dependent on 4 L saturations adequate on this  Patient has maximized her therapy  We will see if home physical therapy can see her again      Chronic respiratory failure with hypoxia and hypercapnia (HCC)    Continue oxygen as prescribed        Other   Chronic anxiety    Chronic anxiety and depression will refer to licensed clinical social worker       No orders of the defined types were placed in this encounter.  Follow Up Instructions: Will look into wheelchair with the patient and home physical therapy Patient has follow-up visit will occur in 2 months I discussed the assessment and treatment plan with the patient. The patient was provided an opportunity to ask questions and all were answered. The patient agreed with the plan and demonstrated an understanding of the instructions.   The patient was advised to call back or seek an in-person evaluation if the symptoms worsen or if the condition fails to improve as anticipated.  I provided 28 minutes of non-face-to-face time during this encounter  including  median intraservice time , review of notes, labs, imaging, medications  and explaining diagnosis and management to the patient .    Asencion Noble, MD Follow-up: No follow-ups on file.    Asencion Noble, MD

## 2021-05-08 NOTE — Assessment & Plan Note (Signed)
Blood pressure under good control 

## 2021-05-08 NOTE — Assessment & Plan Note (Signed)
Chronic anxiety and depression will refer to licensed clinical social worker

## 2021-05-08 NOTE — Assessment & Plan Note (Signed)
Continue oxygen as prescribed 

## 2021-05-08 NOTE — Assessment & Plan Note (Signed)
Severe COPD Gold stage D end-stage oxygen dependent on 4 L saturations adequate on this  Patient has maximized her therapy  We will see if home physical therapy can see her again

## 2021-05-09 NOTE — Telephone Encounter (Signed)
Bianca Murray,  I saw ms Bianca Murray yesterday.  She is most insistent on a motorized wheelchair , I will start the ordering process.  She would like a "Grabber" device to reach high place in her closet, is that a DME I can order?  She would like to sign up for a Med Alert button   is that something I order?  Adding [name]:  [name] she would like a video visit for depression counseling  Also wants to see home PT again, will order

## 2021-05-10 ENCOUNTER — Telehealth: Payer: Self-pay

## 2021-05-10 NOTE — Telephone Encounter (Signed)
Call placed to [facility], spoke to [place] who said that the patient is still actively receiving home PT with last visit being 05/03/2021.  She will confirm that the patient is still active and call this CM back

## 2021-05-10 NOTE — Telephone Encounter (Signed)
Call placed to [facility], spoke to [place]. She confirmed that the patient is still active with PT and they do not need additional orders.  They have orders through 05/18/2021 and will request orders to re-certify if needed.

## 2021-05-17 ENCOUNTER — Ambulatory Visit (HOSPITAL_BASED_OUTPATIENT_CLINIC_OR_DEPARTMENT_OTHER): Payer: Medicare Other | Admitting: Clinical

## 2021-05-17 ENCOUNTER — Other Ambulatory Visit: Payer: Self-pay | Admitting: Critical Care Medicine

## 2021-05-17 DIAGNOSIS — F411 Generalized anxiety disorder: Secondary | ICD-10-CM

## 2021-05-17 NOTE — Telephone Encounter (Signed)
Requested medications are due for refill today.  yes  Requested medications are on the active medications list.  yes  Last refill. 04/05/2021  Future visit scheduled.   no  Notes to clinic.  Labs are expired.

## 2021-05-19 NOTE — Telephone Encounter (Signed)
I met with this pt on 05/17/21 and have a fu scheduled for 06/05/21

## 2021-05-19 NOTE — Telephone Encounter (Signed)
I spoke with him and he mentioned that he is in continuous pain. He says that he is staying with a friend. Reports that he is interested in legal aid to assist with disability appeal.   I also scheduled an appt with him for 06/05/21 at 11:30. Denies SI/HI. I provided pt with information on [place] again and encouraged him to go. He mentioned that he would go on Monday 05/22/21.

## 2021-05-19 NOTE — Telephone Encounter (Signed)
This previous message was sent in error. Please disregard this.

## 2021-05-21 NOTE — Telephone Encounter (Signed)
noted 

## 2021-05-26 NOTE — BH Specialist Note (Signed)
Integrated Behavioral Health via Telemedicine Visit 05/17/2021 Bianca Murray [id number]  Number of [place] visits: 1/6 Session Start time: 9:35am  Session End time: 10:17am Total time:  42 minutes  Referring Provider: PCP Asencion Noble, MD Patient/Family location: Home [facility] Provider location: Pt and LCSWA All persons participating in visit: Pt and LCSWA Types of Service: Individual psychotherapy and Video visit  I connected with Bianca Murray via Video Enabled Telemedicine Application  (Video is Caregility application) and verified that I am speaking with the correct person using two identifiers. Discussed confidentiality: Yes   I discussed the limitations of telemedicine and the availability of in person appointments.  Discussed there is a possibility of technology failure and discussed alternative modes of communication if that failure occurs.  I discussed that engaging in this telemedicine visit, they consent to the provision of behavioral healthcare and the services will be billed under their insurance.  Patient and/or legal guardian expressed understanding and consented to Telemedicine visit: Yes   Presenting Concerns: Patient and/or family reports the following symptoms/concerns: Reports feeling anxious, depressed at times, difficulty sleeping, decreased energy, self-esteem disturbances, excessive worrying, trouble relaxing, restlessness, and irritability. Reports that she has experiences physical health changes and frequently worries that something bad will happen. Reports staying in her house due to not being able to walk around without trouble breathing as a result of physical health. Reports often feeling lonely, bored, and not wanting to be a "burden" on family. Reports that she has not left her home, aside from going on her porch, since April 2022.  Duration of problem: 1 year; Severity of problem: moderate  Patient and/or Family's Strengths/Protective  Factors: Concrete supports in place (healthy food, safe environments, etc.)  Goals Addressed: Patient will:  Reduce symptoms of: anxiety and depression   Increase knowledge and/or ability of: coping skills   Demonstrate ability to: Increase healthy adjustment to current life circumstances  Progress towards Goals: Ongoing  Interventions: Interventions utilized:  Mindfulness or Psychologist, educational, CBT Cognitive Behavioral Therapy, Supportive Counseling, and Psychoeducation and/or Health Education Standardized Assessments completed: GAD-7 and PHQ 9  Patient and/or Family Response: Pt receptive to tx. Pt receptive to psycheoducation provided on anxiety and depression. Pt receptive to cognitive restructuring utilized to decrease pt worries. Pt receptive to assistance with cognitive processing. Pt receptive to utilizing deep breathing exercises and identifying healthy coping skills in order to improve feelings of boredom. Pt also receptive to ensuring that she spends time outside daily.   Assessment: Denies SI/HI. Denies auditory/visual hallucinations. No safety risks. Patient currently experiencing anxiety and depression related to physical health changes. Pt appears to experience excessive worrying and frequently assumes the worst. Pt has difficulty with cognitive processing skills due to feeling like a burden on her family. Pt has adequate support system, including family and assistance in the home, but does not like to use them due to not wanting to feel like a burden. Pt experiences irritability related to her physical health.   Patient may benefit from brief therapy to assist with adjustment to physical health. LCSWA provided psychoeducation on anxiety and depression. LCSWA utilized cognitive restructuring to decrease pt worries and assisted pt with cognitive processing skills. LCSWA encouraged pt to utilize deep breathing exercises, spend time outside daily, and identify enjoyable  activities/healthy coping skills to incorporate daily. Pt is also requesting a motorized wheel chair to assist with mobility so that she can spend more time with family outside of the home. LCSWA will inform PCP.   Plan:  Follow up with behavioral health clinician on : 06/05/21 Behavioral recommendations: Utilize deep breathing exercises, spend time outside daily, and identify enjoyable activities/healthy coping skills Referral(s): [place] (In Clinic)  I discussed the assessment and treatment plan with the patient and/or parent/guardian. They were provided an opportunity to ask questions and all were answered. They agreed with the plan and demonstrated an understanding of the instructions.   They were advised to call back or seek an in-person evaluation if the symptoms worsen or if the condition fails to improve as anticipated.  Akeel Reffner C Bellarae Lizer, LCSW

## 2021-05-29 ENCOUNTER — Other Ambulatory Visit: Payer: Self-pay | Admitting: Critical Care Medicine

## 2021-05-29 NOTE — Telephone Encounter (Signed)
Requested Prescriptions  Pending Prescriptions Disp Refills  . pantoprazole (PROTONIX) 40 MG tablet [Pharmacy Med Name: PANTOPRAZOLE SODIUM 40 MG ORAL TABLET DELAYED RELEASE] 60 tablet 2    Sig: TAKE 1 TABLET BY MOUTH 2 (TWO) TIMES DAILY BEFORE A MEAL (AM+EVENING)     Gastroenterology: Proton Pump Inhibitors Passed - 05/29/2021 11:58 AM      Passed - Valid encounter within last 12 months    Recent Outpatient Visits          3 weeks ago Mixed simple and mucopurulent chronic bronchitis ([place])   [place] Elsie Stain, MD   2 months ago Need for vaccination for Strep pneumoniae   [place] Elsie Stain, MD   4 months ago Mixed simple and mucopurulent chronic bronchitis [facility])   [place], Patrick E, MD   6 months ago Centrilobular emphysema [facility])   [place], Patrick E, MD   8 months ago Primary hypertension   [place] Elsie Stain, MD

## 2021-06-05 ENCOUNTER — Encounter: Payer: Medicare Other | Admitting: Clinical

## 2021-06-08 ENCOUNTER — Telehealth: Payer: Self-pay

## 2021-06-08 NOTE — Telephone Encounter (Signed)
Attempted to contact patient  three times [phone] to schedule in person appointment with Dr Joya Gaskins to address need for power chair. Unable to leave a message, voicemail not set up.

## 2021-06-08 NOTE — Telephone Encounter (Signed)
Pt missed call from Opal Sidles re:  wheelchair Very sorry. Will try to be available for a call in the am maybe around 9:ish. She really wants return call and will make every effort. Tried to make appt but available appts for Dr. Joya Gaskins are way out. Hoping Opal Sidles could have something soon. Fu at [phone]

## 2021-06-09 NOTE — Telephone Encounter (Signed)
Call placed to patient regarding appointment for power chair evaluation.  She has an appointment with Dennison Mascot, LCSW on 11/7.  Dr Joya Gaskins does not have any available appointments that day and she would like to have the appointments with Dr Joya Gaskins and [name] the same day.  Explained to her that this CM will need to arrange with [name] and will call her back next week with updated appointment schedule.

## 2021-06-14 ENCOUNTER — Telehealth (HOSPITAL_COMMUNITY): Payer: Self-pay

## 2021-06-14 NOTE — Telephone Encounter (Signed)
Bianca Murray will notify patient of change in upcoming appointments. She is now scheduled to see Bianca Mascot, LCSW 06/28/2021 @ 0930 and Bianca Murray for a power chair eval the same day at 1000.

## 2021-06-14 NOTE — Telephone Encounter (Signed)
Attempted to call Bianca Murray to remind her of appointment changes from Nov 7th to Nov 16th. She did not answer so I sent her a text message on her phone. I will follow up for home visit next Wednesday.

## 2021-06-15 ENCOUNTER — Telehealth: Payer: Self-pay

## 2021-06-15 NOTE — Telephone Encounter (Signed)
Per [place], patient's tub transfer bench was delivered 03/17/2021

## 2021-06-16 ENCOUNTER — Telehealth: Payer: Self-pay | Admitting: Critical Care Medicine

## 2021-06-16 NOTE — Telephone Encounter (Signed)
Copied from [place] [phone]. Topic: Quick Communication - Home Health Verbal Orders >> Jun 14, 2021  3:18 PM [place] wrote: -Caller/Agency: [facility]/ June Callback Number: [phone] Requesting /PT times a week x4 wk  For strength-gait training/energy conservation/ was evaluated on 11/1

## 2021-06-19 ENCOUNTER — Encounter: Payer: Medicare Other | Admitting: Clinical

## 2021-06-19 NOTE — Telephone Encounter (Signed)
I called the callback number and gave verbal orders for her.

## 2021-06-21 ENCOUNTER — Other Ambulatory Visit: Payer: Self-pay | Admitting: Critical Care Medicine

## 2021-06-21 ENCOUNTER — Other Ambulatory Visit (HOSPITAL_COMMUNITY): Payer: Self-pay

## 2021-06-21 NOTE — Telephone Encounter (Signed)
Requested Prescriptions  Pending Prescriptions Disp Refills  . albuterol (VENTOLIN HFA) 108 (90 Base) MCG/ACT inhaler [Pharmacy Med Name: ALBUTEROL SULFATE HFA 108 (90 BASE) MCG/ACT INHALATION AEROSOL SOLUTION] 18 g 1    Sig: INHALE TWO PUFFS BY MOUTH EVERY SIX HOURS AS NEEDED FOR WHEEZING OR SHORTNESS OF BREATH     Pulmonology:  Beta Agonists Failed - 06/21/2021  1:17 PM      Failed - One inhaler should last at least one month. If the patient is requesting refills earlier, contact the patient to check for uncontrolled symptoms.      Passed - Valid encounter within last 12 months    Recent Outpatient Visits          1 month ago Mixed simple and mucopurulent chronic bronchitis ([place])   [place] Elsie Stain, MD   3 months ago Need for vaccination for Strep pneumoniae   [place] Elsie Stain, MD   5 months ago Mixed simple and mucopurulent chronic bronchitis [facility])   [place] Elsie Stain, MD   7 months ago Centrilobular emphysema [facility])   [place] Elsie Stain, MD   9 months ago Primary hypertension   [place], MD      Future Appointments            In 1 week McCoy, Alonna Buckler, [place]   In 1 week Joya Gaskins Burnett Harry, MD [place]
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
## 2021-06-26 ENCOUNTER — Other Ambulatory Visit: Payer: Self-pay | Admitting: Critical Care Medicine

## 2021-06-26 NOTE — Telephone Encounter (Signed)
Requested Prescriptions  Pending Prescriptions Disp Refills  . gabapentin (NEURONTIN) 100 MG capsule [Pharmacy Med Name: GABAPENTIN 100 MG ORAL CAPSULE] 90 capsule 0    Sig: TAKE 1 CAPSULE (100 MG TOTAL) BY MOUTH 3 (THREE) TIMES DAILY.     Neurology: Anticonvulsants - gabapentin Passed - 06/26/2021 12:12 PM      Passed - Valid encounter within last 12 months    Recent Outpatient Visits          1 month ago Mixed simple and mucopurulent chronic bronchitis ([place])   [place] Elsie Stain, MD   3 months ago Need for vaccination for Strep pneumoniae   [place] Elsie Stain, MD   5 months ago Mixed simple and mucopurulent chronic bronchitis [facility])   [place] Elsie Stain, MD   7 months ago Centrilobular emphysema [facility])   [place] Elsie Stain, MD   9 months ago Primary hypertension   [place], MD      Future Appointments            In 2 days McCoy, Alonna Buckler, [place]   In 2 days Elsie Stain, MD [place]           . ibuprofen (IBU) 600 MG tablet 90 tablet 0    Sig: TAKE 1 TABLET(600 MG) BY MOUTH EVERY 6 HOURS AS NEEDED     Analgesics:  NSAIDS Failed - 06/26/2021 12:12 PM      Failed - Cr in normal range and within 360 days    Creatinine, Ser  Date Value Ref Range Status  11/04/2018 0.80 0.44 - 1.00 mg/dL Final         Failed - HGB in normal range and within 360 days    Hemoglobin  Date Value Ref Range Status  11/04/2018 10.8 (L) 12.0 - 15.0 g/dL Final         Passed - Patient is not pregnant      Passed - Valid encounter within last 12 months    Recent Outpatient Visits          1 month ago Mixed simple and mucopurulent chronic bronchitis ([place])   [place] Elsie Stain, MD   3 months ago Need for vaccination for Strep pneumoniae   [place] Elsie Stain, MD   5 months ago Mixed simple and mucopurulent chronic bronchitis [facility])   [place] Elsie Stain, MD   7 months ago Centrilobular emphysema [facility])   [place] Elsie Stain, MD   9 months ago Primary hypertension   [place], MD      Future Appointments            In 2 days McCoy, Alonna Buckler, [place]   In 2 days Elsie Stain, MD [place]

## 2021-06-27 ENCOUNTER — Other Ambulatory Visit: Payer: Self-pay | Admitting: Critical Care Medicine

## 2021-06-27 NOTE — Telephone Encounter (Signed)
Requested Prescriptions  Pending Prescriptions Disp Refills  . loratadine (ALLERGY RELIEF) 10 MG tablet [Pharmacy Med Name: ALLERGY RELIEF 10 MG ORAL TABLET] 90 tablet 0    Sig: TAKE ONE TABLET BY MOUTH ONCE DAILY (AM)     Ear, Nose, and Throat:  Antihistamines Passed - 06/27/2021  5:10 PM      Passed - Valid encounter within last 12 months    Recent Outpatient Visits          1 month ago Mixed simple and mucopurulent chronic bronchitis ([place])   [place] Elsie Stain, MD   3 months ago Need for vaccination for Strep pneumoniae   [place] Elsie Stain, MD   5 months ago Mixed simple and mucopurulent chronic bronchitis [facility])   [place] Elsie Stain, MD   7 months ago Centrilobular emphysema [facility])   [place] Elsie Stain, MD   9 months ago Primary hypertension   [place], [place], MD      Future Appointments            Tomorrow Ruffin Pyo, [place], [place], MD [place]

## 2021-06-28 ENCOUNTER — Other Ambulatory Visit: Payer: Self-pay

## 2021-06-28 ENCOUNTER — Encounter: Payer: Self-pay | Admitting: Critical Care Medicine

## 2021-06-28 ENCOUNTER — Ambulatory Visit (HOSPITAL_BASED_OUTPATIENT_CLINIC_OR_DEPARTMENT_OTHER): Payer: Medicare Other | Admitting: Critical Care Medicine

## 2021-06-28 ENCOUNTER — Ambulatory Visit: Payer: Medicare Other | Attending: Critical Care Medicine | Admitting: Clinical

## 2021-06-28 VITALS — BP 132/88 | HR 120 | Resp 16 | Wt 217.0 lb

## 2021-06-28 DIAGNOSIS — Z23 Encounter for immunization: Secondary | ICD-10-CM | POA: Diagnosis not present

## 2021-06-28 DIAGNOSIS — Z7409 Other reduced mobility: Secondary | ICD-10-CM

## 2021-06-28 DIAGNOSIS — I10 Essential (primary) hypertension: Secondary | ICD-10-CM

## 2021-06-28 DIAGNOSIS — J418 Mixed simple and mucopurulent chronic bronchitis: Secondary | ICD-10-CM | POA: Diagnosis not present

## 2021-06-28 DIAGNOSIS — Z741 Need for assistance with personal care: Secondary | ICD-10-CM

## 2021-06-28 DIAGNOSIS — F411 Generalized anxiety disorder: Secondary | ICD-10-CM

## 2021-06-28 DIAGNOSIS — J9611 Chronic respiratory failure with hypoxia: Secondary | ICD-10-CM

## 2021-06-28 DIAGNOSIS — J9612 Chronic respiratory failure with hypercapnia: Secondary | ICD-10-CM

## 2021-06-28 MED ORDER — LORATADINE 10 MG PO TABS: ORAL_TABLET | ORAL | 0 refills | Status: DC

## 2021-06-28 MED ORDER — AMLODIPINE BESYLATE 5 MG PO TABS: ORAL_TABLET | ORAL | 1 refills | Status: DC

## 2021-06-28 MED ORDER — AZELASTINE HCL 0.1 % NA SOLN: 2.0000 | Freq: Two times a day (BID) | NASAL | 12 refills | Status: DC

## 2021-06-28 MED ORDER — LOSARTAN POTASSIUM 100 MG PO TABS: 100.0000 mg | ORAL_TABLET | Freq: Every day | ORAL | 2 refills | Status: DC

## 2021-06-28 MED ORDER — GABAPENTIN 100 MG PO CAPS: 100.0000 mg | ORAL_CAPSULE | Freq: Three times a day (TID) | ORAL | 0 refills | Status: DC

## 2021-06-28 MED ORDER — VITAMIN D (ERGOCALCIFEROL) 1.25 MG (50000 UNIT) PO CAPS: 50000.0000 [IU] | ORAL_CAPSULE | ORAL | 1 refills | Status: DC

## 2021-06-28 MED ORDER — BREZTRI AEROSPHERE 160-9-4.8 MCG/ACT IN AERO: 2.0000 | INHALATION_SPRAY | Freq: Two times a day (BID) | RESPIRATORY_TRACT | 6 refills | Status: DC

## 2021-06-28 MED ORDER — POLYETHYLENE GLYCOL 3350 17 GM/SCOOP PO POWD: 17.0000 g | Freq: Every day | ORAL | 1 refills | Status: DC | PRN

## 2021-06-28 MED ORDER — PANTOPRAZOLE SODIUM 40 MG PO TBEC
DELAYED_RELEASE_TABLET | ORAL | 2 refills | Status: DC
Start: 2021-06-28 — End: 2021-09-28

## 2021-06-28 NOTE — Assessment & Plan Note (Signed)
Continued blood pressure medications

## 2021-06-28 NOTE — Assessment & Plan Note (Signed)
Continue oxygen as prescribed 

## 2021-06-28 NOTE — Patient Instructions (Signed)
We will be processing the orders for the wheelchair  Refills on your medicines sent to your pharmacy  Flu vaccine given  Dr. Joya Gaskins will do a video visit with you and 3 months, if available may change this to a housecall

## 2021-06-28 NOTE — Assessment & Plan Note (Signed)
This patient has significant impaired mobility. 's patient has inability to do ADLs in her home such as cooking cleaning and toileting, this patient has difficulty with fine motor movement and is not able to operate a tiller type steering, the patient has adequate mental capacity and willingness to use a power wheelchair in the home safely.  Patient has significant upper and lower extremity weakness and desaturates significantly with minimal exertion on 4 L oxygen.  The patient's Rollator is not an option for mobility nor is a wheelchair that is manual.

## 2021-06-28 NOTE — Assessment & Plan Note (Signed)
Severe COPD.  Continue current inhalers.

## 2021-06-28 NOTE — Progress Notes (Signed)
Established Patient Office Visit Mobility assessment for power wheelchair  Subjective:  Patient ID: Bianca Murray, female    DOB: 1955-09-19  Age: 65 y.o. MRN: [id number]  CC: Powerchair assessment   HPI Bianca Murray presents for mobility assessment.  This patient has history of severe COPD and also lumbosacral spine disease and hip disease and she is dependent upon Rollator in the home.  She is essentially homebound and cannot get out of her room because of severe shortness of breath with minimal exertion and orthopedic limitations with her left lower extremity and left hip.  The primary reason for this visit indeed is mobility assessment and this is a face-to-face visit for a power wheelchair qualification.  Patient is on oxygen 2 L rest 4 L exertion.  She is on multiple medications for her pulmonary condition.  See below for specific data on strength height and weight  The patient would benefit from a power chair for home ADLs including cleaning and toileting.  She is essentially locked in her room and cannot get out and this would allow more mobility within her home.  The patient does have a Rollator but is not able to operate this because of severe mobility issues and also severe weakness in the upper and lower extremities and pain in the lower back along with significant desaturation with exertion.  She has decreased range of motion as well on the left lower extremity.  See physical assessment below.  The patient also does not have adequate grip for a tiller type steering device see physical assessment below.  The patient does have good mental capacity to operate a wheelchair and use it safely in the home.  Past Medical History:  Diagnosis Date   Arthritis    Asthma    Cancer ([place])    Chronic respiratory failure with hypoxia and hypercapnia (HCC)    COPD (chronic obstructive pulmonary disease) (HCC)    Hypertension     Past Surgical History:  Procedure Laterality Date    ABDOMINAL HYSTERECTOMY     TRACHEOSTOMY     reversed   TRACHEOSTOMY CLOSURE      Family History  Problem Relation Age of Onset   Hypertension Mother    Heart disease Mother    Cancer Mother    Asthma Father    Asthma Sister    Hypertension Sister    Asthma Brother    Cancer Brother     Social History   Socioeconomic History   Marital status: Single    Spouse name: Not on file   Number of children: Not on file   Years of education: Not on file   Highest education level: Not on file  Occupational History   Not on file  Tobacco Use   Smoking status: Former   Smokeless tobacco: Never   Tobacco comments:    quit smoking in 2018    Vaping Use   Vaping Use: Never used  Substance and Sexual Activity   Alcohol use: Not Currently   Drug use: Not Currently   Sexual activity: Not on file  Other Topics Concern   Not on file  Social History Narrative   Not on file   Social Determinants of Health   Financial Resource Strain: Not on file  Food Insecurity: Not on file  Transportation Needs: Not on file  Physical Activity: Not on file  Stress: Not on file  Social Connections: Not on file  Intimate Partner Violence: Not on file    Outpatient  Medications Prior to Visit  Medication Sig Dispense Refill   albuterol (VENTOLIN HFA) 108 (90 Base) MCG/ACT inhaler INHALE TWO PUFFS BY MOUTH EVERY SIX HOURS AS NEEDED FOR WHEEZING OR SHORTNESS OF BREATH 18 g 0   Blood Pressure Monitoring (BLOOD PRESSURE MONITOR AUTOMAT) DEVI Measure blood pressure and pulse daily 1 Device 0   ibuprofen (IBU) 600 MG tablet TAKE 1 TABLET(600 MG) BY MOUTH EVERY 6 HOURS AS NEEDED 90 tablet 0   Misc. Devices (PULSE OXIMETER FOR FINGER) MISC 1 application by Does not apply route daily as needed. 1 each 0   Misc. Devices MISC Nebulizer machine, Large shower chair.  Diagnosis- chronic respiratory failure 1 each 0   Spacer/Aero-Holding Chambers (AEROCHAMBER MV) inhaler Use as instructed 1 each 0   amLODipine  (NORVASC) 5 MG tablet TAKE 1 TABLET(5 MG) BY MOUTH DAILY 90 tablet 1   azelastine (ASTELIN) 0.1 % nasal spray Place 2 sprays into both nostrils 2 (two) times daily. Use in each nostril as directed 30 mL 12   Budeson-Glycopyrrol-Formoterol (BREZTRI AEROSPHERE) 160-9-4.8 MCG/ACT AERO Inhale 2 puffs into the lungs in the morning and at bedtime. 10.7 g 6   clonazePAM (KLONOPIN) 0.5 MG tablet Take 1 tablet (0.5 mg total) by mouth 3 (three) times daily as needed for anxiety (severe breathlessness). Take two tablets by mouth as needed for anxiety 60 tablet 0   gabapentin (NEURONTIN) 100 MG capsule TAKE 1 CAPSULE (100 MG TOTAL) BY MOUTH 3 (THREE) TIMES DAILY. 90 capsule 0   loratadine (ALLERGY RELIEF) 10 MG tablet TAKE ONE TABLET BY MOUTH ONCE DAILY (AM) 90 tablet 0   losartan (COZAAR) 100 MG tablet Take 1 tablet (100 mg total) by mouth daily. 90 tablet 2   pantoprazole (PROTONIX) 40 MG tablet TAKE 1 TABLET BY MOUTH 2 (TWO) TIMES DAILY BEFORE A MEAL (AM+EVENING) 60 tablet 2   polyethylene glycol powder (GLYCOLAX/MIRALAX) 17 GM/SCOOP powder Take 17 g by mouth daily as needed. 507 g 1   Vitamin D, Ergocalciferol, (DRISDOL) 1.25 MG (50000 UNIT) CAPS capsule Take 1 capsule (50,000 Units total) by mouth every 7 (seven) days. 12 capsule 1   albuterol (PROVENTIL) (2.5 MG/3ML) 0.083% nebulizer solution Take 3 mLs (2.5 mg total) by nebulization 3 (three) times daily. 270 mL 1   promethazine-dextromethorphan (PROMETHAZINE-DM) 6.25-15 MG/5ML syrup TAKE 5 MLS BY MOUTH 4 (FOUR) TIMES DAILY AS NEEDED FOR COUGH. (Patient not taking: Reported on 06/28/2021) 240 mL 0   Simethicone 125 MG CAPS Take 2 capsules (250 mg total) by mouth 3 (three) times daily with meals. 180 capsule 0   No facility-administered medications prior to visit.    No Known Allergies  ROS Review of Systems  Constitutional:  Negative for chills, diaphoresis and fever.  HENT:  Negative for congestion, hearing loss, nosebleeds, sore throat and  tinnitus.   Eyes:  Negative for photophobia and redness.  Respiratory:  Positive for shortness of breath and wheezing. Negative for cough and stridor.   Cardiovascular:  Positive for chest pain. Negative for palpitations and leg swelling.  Gastrointestinal:  Negative for abdominal pain, blood in stool, constipation, diarrhea, nausea and vomiting.  Endocrine: Negative for polydipsia.  Genitourinary:  Negative for dysuria, flank pain, frequency, hematuria and urgency.  Musculoskeletal:  Positive for arthralgias, back pain and gait problem. Negative for myalgias and neck pain.  Skin:  Negative for rash.  Allergic/Immunologic: Negative for environmental allergies.  Neurological:  Positive for dizziness, tremors and weakness. Negative for seizures and headaches.  Hematological:  Does not bruise/bleed easily.  Psychiatric/Behavioral: Negative.  Negative for suicidal ideas. The patient is not nervous/anxious.      Objective:    Physical Exam Vitals reviewed.  Constitutional:      Appearance: Normal appearance. She is well-developed. She is obese. She is not diaphoretic.  HENT:     Head: Normocephalic and atraumatic.     Nose: No nasal deformity, septal deviation, mucosal edema or rhinorrhea.     Right Sinus: No maxillary sinus tenderness or frontal sinus tenderness.     Left Sinus: No maxillary sinus tenderness or frontal sinus tenderness.     Mouth/Throat:     Pharynx: No oropharyngeal exudate.  Eyes:     General: No scleral icterus.    Conjunctiva/sclera: Conjunctivae normal.     Pupils: Pupils are equal, round, and reactive to light.  Neck:     Thyroid: No thyromegaly.     Vascular: No carotid bruit or JVD.     Trachea: Trachea normal. No tracheal tenderness or tracheal deviation.  Cardiovascular:     Rate and Rhythm: Normal rate and regular rhythm.     Chest Wall: PMI is not displaced.     Pulses: Normal pulses. No decreased pulses.     Heart sounds: Normal heart sounds, S1  normal and S2 normal. Heart sounds not distant. No murmur heard. No systolic murmur is present.  No diastolic murmur is present.    No friction rub. No gallop. No S3 or S4 sounds.  Pulmonary:     Effort: Accessory muscle usage and prolonged expiration present. No tachypnea or respiratory distress.     Breath sounds: Decreased air movement present. No stridor. Examination of the right-upper field reveals decreased breath sounds. Examination of the left-upper field reveals decreased breath sounds. Examination of the right-middle field reveals decreased breath sounds. Examination of the left-middle field reveals decreased breath sounds. Examination of the right-lower field reveals decreased breath sounds and wheezing. Examination of the left-lower field reveals decreased breath sounds and wheezing. Decreased breath sounds and wheezing present. No rhonchi or rales.     Comments: On 4 L with a Rollator the patient can walk about 5 feet and the saturation with this exertion goes down to 89% on 4 L Chest:     Chest wall: No tenderness.  Abdominal:     General: Bowel sounds are normal. There is no distension.     Palpations: Abdomen is soft. Abdomen is not rigid.     Tenderness: There is no abdominal tenderness. There is no guarding or rebound.  Musculoskeletal:        General: Normal range of motion.     Cervical back: Normal range of motion and neck supple. No edema, erythema or rigidity. No muscular tenderness. Normal range of motion.  Lymphadenopathy:     Head:     Right side of head: No submental or submandibular adenopathy.     Left side of head: No submental or submandibular adenopathy.     Cervical: No cervical adenopathy.  Skin:    General: Skin is warm and dry.     Coloration: Skin is not pale.     Findings: No rash.     Nails: There is no clubbing.  Neurological:     Mental Status: She is alert and oriented to person, place, and time.     Cranial Nerves: Cranial nerves 2-12 are  intact.     Sensory: Sensation is intact. No sensory deficit.     Motor:  Weakness and tremor present.     Coordination: Coordination is intact.     Gait: Gait abnormal and tandem walk abnormal.     Deep Tendon Reflexes: Reflexes are normal and symmetric.     Comments: The patient has 3/5 strength in upper extremities left and right in all areas tested the patient has 4/5 strength the right lower extremity but only 3/5 in left lower extremity in all areas tested.  The pain is rated on a scale of 0-10 of 7  Patient does have decreased range of motion in both the left hip knee and ankle  Psychiatric:        Speech: Speech normal.        Behavior: Behavior normal.    BP 132/88   Pulse (!) 120   Resp 16   Wt 217 lb (98.4 kg)   SpO2 91%   BMI 38.44 kg/m  Wt Readings from Last 3 Encounters:  06/28/21 217 lb (98.4 kg)  01/26/21 225 lb (102.1 kg)  12/21/20 224 lb (101.6 kg)  Height is 5 foot 4   Health Maintenance Due  Topic Date Due   Zoster Vaccines- Shingrix (1 of 2) Never done   COVID-19 Vaccine (3 - Booster for Moderna series) 09/20/2020    There are no preventive care reminders to display for this patient.  No results found for: TSH Lab Results  Component Value Date   WBC 13.6 (H) 11/04/2018   HGB 10.8 (L) 11/04/2018   HCT 35.1 (L) 11/04/2018   MCV 90.9 11/04/2018   PLT 308 11/04/2018   Lab Results  Component Value Date   NA 138 11/04/2018   K 4.2 11/04/2018   CO2 33 (H) 11/04/2018   GLUCOSE 184 (H) 11/04/2018   BUN 19 11/04/2018   CREATININE 0.80 11/04/2018   BILITOT 0.3 10/28/2018   ALKPHOS 70 10/28/2018   AST 17 10/28/2018   ALT 10 10/28/2018   PROT 6.9 10/28/2018   ALBUMIN 3.5 10/28/2018   CALCIUM 9.1 11/04/2018   ANIONGAP 7 11/04/2018   No results found for: CHOL No results found for: HDL No results found for: LDLCALC No results found for: TRIG No results found for: CHOLHDL No results found for: HGBA1C    Assessment & Plan:   Problem List  Items Addressed This Visit       Cardiovascular and Mediastinum   Hypertension    Continued blood pressure medications      Relevant Medications   amLODipine (NORVASC) 5 MG tablet   losartan (COZAAR) 100 MG tablet   Other Relevant Orders   Comprehensive metabolic panel   CBC with Differential/Platelet   Lipid panel     Respiratory   COPD GOLD D (chronic obstructive pulmonary disease) (HCC)    Severe COPD  Continue current inhalers      Relevant Medications   Budeson-Glycopyrrol-Formoterol (BREZTRI AEROSPHERE) 160-9-4.8 MCG/ACT AERO   azelastine (ASTELIN) 0.1 % nasal spray   loratadine (ALLERGY RELIEF) 10 MG tablet   Chronic respiratory failure with hypoxia and hypercapnia (HCC)    Continue oxygen as prescribed        Other   Impaired mobility and personal care - Primary    This patient has significant impaired mobility. 's patient has inability to do ADLs in her home such as cooking cleaning and toileting, this patient has difficulty with fine motor movement and is not able to operate a tiller type steering, the patient has adequate mental capacity and willingness to use a power  wheelchair in the home safely.  Patient has significant upper and lower extremity weakness and desaturates significantly with minimal exertion on 4 L oxygen.  The patient's Rollator is not an option for mobility nor is a wheelchair that is manual.      Other Visit Diagnoses     Need for immunization against influenza       Relevant Orders   Flu Vaccine QUAD 75mo+IM (Fluarix, Fluzone & Alfiuria Quad PF) (Completed)       Meds ordered this encounter  Medications   amLODipine (NORVASC) 5 MG tablet    Sig: TAKE 1 TABLET(5 MG) BY MOUTH DAILY    Dispense:  90 tablet    Refill:  1   Budeson-Glycopyrrol-Formoterol (BREZTRI AEROSPHERE) 160-9-4.8 MCG/ACT AERO    Sig: Inhale 2 puffs into the lungs in the morning and at bedtime.    Dispense:  10.7 g    Refill:  6    Please deliver   azelastine  (ASTELIN) 0.1 % nasal spray    Sig: Place 2 sprays into both nostrils 2 (two) times daily. Use in each nostril as directed    Dispense:  30 mL    Refill:  12   gabapentin (NEURONTIN) 100 MG capsule    Sig: Take 1 capsule (100 mg total) by mouth 3 (three) times daily.    Dispense:  90 capsule    Refill:  0   loratadine (ALLERGY RELIEF) 10 MG tablet    Sig: TAKE ONE TABLET BY MOUTH ONCE DAILY (AM)    Dispense:  90 tablet    Refill:  0   losartan (COZAAR) 100 MG tablet    Sig: Take 1 tablet (100 mg total) by mouth daily.    Dispense:  90 tablet    Refill:  2   pantoprazole (PROTONIX) 40 MG tablet    Sig: TAKE 1 TABLET BY MOUTH 2 (TWO) TIMES DAILY BEFORE A MEAL (AM+EVENING)    Dispense:  60 tablet    Refill:  2   polyethylene glycol powder (GLYCOLAX/MIRALAX) 17 GM/SCOOP powder    Sig: Take 17 g by mouth daily as needed.    Dispense:  507 g    Refill:  1   Vitamin D, Ergocalciferol, (DRISDOL) 1.25 MG (50000 UNIT) CAPS capsule    Sig: Take 1 capsule (50,000 Units total) by mouth every 7 (seven) days.    Dispense:  12 capsule    Refill:  1    Follow-up: Return in about 3 months (around 09/28/2021).    Asencion Noble, MD

## 2021-06-29 ENCOUNTER — Ambulatory Visit: Payer: Self-pay | Admitting: *Deleted

## 2021-06-29 ENCOUNTER — Other Ambulatory Visit: Payer: Self-pay | Admitting: Critical Care Medicine

## 2021-06-29 ENCOUNTER — Telehealth: Payer: Self-pay

## 2021-06-29 DIAGNOSIS — E782 Mixed hyperlipidemia: Secondary | ICD-10-CM

## 2021-06-29 LAB — CBC WITH DIFFERENTIAL/PLATELET
Basophils Absolute: 0 10*3/uL (ref 0.0–0.2)
Basos: 1 %
EOS (ABSOLUTE): 0.1 10*3/uL (ref 0.0–0.4)
Eos: 1 %
Hematocrit: 34.2 % (ref 34.0–46.6)
Hemoglobin: 11.2 g/dL (ref 11.1–15.9)
Immature Grans (Abs): 0 10*3/uL (ref 0.0–0.1)
Immature Granulocytes: 0 %
Lymphocytes Absolute: 1 10*3/uL (ref 0.7–3.1)
Lymphs: 14 %
MCH: 28 pg (ref 26.6–33.0)
MCHC: 32.7 g/dL (ref 31.5–35.7)
MCV: 86 fL (ref 79–97)
Monocytes Absolute: 0.5 10*3/uL (ref 0.1–0.9)
Monocytes: 7 %
Neutrophils Absolute: 5.4 10*3/uL (ref 1.4–7.0)
Neutrophils: 77 %
Platelets: 299 10*3/uL (ref 150–450)
RBC: 4 x10E6/uL (ref 3.77–5.28)
RDW: 13 % (ref 11.7–15.4)
WBC: 7 10*3/uL (ref 3.4–10.8)

## 2021-06-29 LAB — COMPREHENSIVE METABOLIC PANEL
ALT: 9 IU/L (ref 0–32)
AST: 13 IU/L (ref 0–40)
Albumin/Globulin Ratio: 1.8 (ref 1.2–2.2)
Albumin: 4.7 g/dL (ref 3.8–4.8)
Alkaline Phosphatase: 80 IU/L (ref 44–121)
BUN/Creatinine Ratio: 13 (ref 12–28)
BUN: 11 mg/dL (ref 8–27)
Bilirubin Total: <0.2 mg/dL (ref 0.0–1.2)
CO2: 29 mmol/L (ref 20–29)
Calcium: 9.8 mg/dL (ref 8.7–10.3)
Chloride: 99 mmol/L (ref 96–106)
Creatinine, Ser: 0.84 mg/dL (ref 0.57–1.00)
Globulin, Total: 2.6 g/dL (ref 1.5–4.5)
Glucose: 114 mg/dL — ABNORMAL HIGH (ref 70–99)
Potassium: 4.4 mmol/L (ref 3.5–5.2)
Sodium: 145 mmol/L — ABNORMAL HIGH (ref 134–144)
Total Protein: 7.3 g/dL (ref 6.0–8.5)
eGFR: 77 mL/min/{1.73_m2} (ref >59)

## 2021-06-29 LAB — LIPID PANEL
Chol/HDL Ratio: 2.5 ratio (ref 0.0–4.4)
Cholesterol, Total: 343 mg/dL — ABNORMAL HIGH (ref 100–199)
HDL: 135 mg/dL (ref >39)
LDL Chol Calc (NIH): 201 mg/dL — ABNORMAL HIGH (ref 0–99)
Triglycerides: 61 mg/dL (ref 0–149)
VLDL Cholesterol Cal: 7 mg/dL (ref 5–40)

## 2021-06-29 MED ORDER — ATORVASTATIN CALCIUM 10 MG PO TABS: 10.0000 mg | ORAL_TABLET | Freq: Every day | ORAL | 3 refills | Status: DC

## 2021-06-29 NOTE — Telephone Encounter (Signed)
Pt was called and vm was left, Information has been sent to nurse pool and letter will be sent.

## 2021-06-29 NOTE — Telephone Encounter (Signed)
Answer Assessment - Initial Assessment Questions 1. ONSET: "When did the muscle aches or body pains start?"      Cramping in right hand and right leg started last night  2. LOCATION: "What part of your body is hurting?" (e.g., entire body, arms, legs)      Right hand and right foot and leg  3. SEVERITY: "How bad is the pain?" (Scale 1-10; or mild, moderate, severe)   - MILD (1-3): doesn't interfere with normal activities    - MODERATE (4-7): interferes with normal activities or awakens from sleep    - SEVERE (8-10):  excruciating pain, unable to do any normal activities      severe 4. CAUSE: "What do you think is causing the pains?"     No sure  5. FEVER: "Have you been having fever?"     na 6. OTHER SYMPTOMS: "Do you have any other symptoms?" (e.g., chest pain, weakness, rash, cold or flu symptoms, weight loss)     No  7. PREGNANCY: "Is there any chance you are pregnant?" "When was your last menstrual period?"     na 8. TRAVEL: "Have you traveled out of the country in the last month?" (e.g., travel history, exposures)     na  Protocols used: Muscle Aches and Body Pain-A-AH

## 2021-06-29 NOTE — Telephone Encounter (Signed)
Order for power wheelchair, product description and office visit note faxed to [facility]

## 2021-06-29 NOTE — Telephone Encounter (Signed)
-----   Message from Elsie Stain, MD sent at 06/29/2021  6:05 AM EST ----- Let pt know labs normal except cholesterol is very high.  I am sendng a cholesterol medication to take daily to her pharmacy

## 2021-06-29 NOTE — Telephone Encounter (Signed)
C/o right hand and right foot and leg with cramping last night. C/o right hand became stiff and painful like a muscle spasm/ cramp or "charlie hoarse" and then later she was stretching and her right foot up to her leg had muscle spasm . C/o severe pain and unable to move hand and it curled up and she "tried to separate it " difficulty I opening hand. Lasted approx. 15- 20 minutes. Has happened comes and goes at random times and reports spasms have not happened from a "long time ago ' until last night. Denies chest pain , difficulty breathing, no N/T weakness in right or left side. Denies HA, blurred vision. No cramping in right hand or foot or leg at this time. Took ibuprofen and some mustard and cramping eased and went away. Afraid it will return. Please advise . Recent lab work completed and reviewed results per notes of Dr. Joya Gaskins. Patient aware to start taking medication for elevated cholesterol daily. Please advise if appt needed. Patient reports she was just seen yesterday . Care advise given if cramping occurs and severe pain noted and she is not able to move go to ED. Patient verbalized understanding of care advise and to call back or go to [facility] or ED or call 911 if symptoms worsen.

## 2021-06-30 MED ORDER — CYCLOBENZAPRINE HCL 10 MG PO TABS: 10.0000 mg | ORAL_TABLET | Freq: Three times a day (TID) | ORAL | 1 refills | Status: DC | PRN

## 2021-06-30 NOTE — Addendum Note (Signed)
Addended by: Elsie Stain on: 06/30/2021 06:51 AM   Modules accepted: Orders

## 2021-06-30 NOTE — BH Specialist Note (Signed)
Integrated Behavioral Health Follow Up In-Person Visit  MRN: [id number] Name: Camani Sesay  Number of [facility] Clinician visits: 2/6 Session Start time: 9:40am  Session End time: 10:15am Total time: 35  minutes  Types of Service: Individual psychotherapy  Interpretor:No. Interpretor Name and Language: N/A  Subjective: Bianca Murray is a 65 y.o. female accompanied by  self Patient was referred by PCP Asencion Noble for depression and severe anxiety. Patient reports the following symptoms/concerns: Reports feeling depressed, trouble sleeping, decrased energy, anxiousness, excessive worrying, trouble relaxing, and irritability. Reports difficulty with leaving the house and going in public due to fear of being a burden on family and falling or something bad happening with her physical health. Pt also reports wanting to live alone due to her daughters relationship with a woman and having to witness them kiss. Reports difficulty adjusting to her daughter identifying as lesbian and her granddaughter also being lesbian.  Duration of problem: 1 year; Severity of problem: moderate  Objective: Mood: Anxious and Depressed and Affect: Appropriate Risk of harm to self or others: No plan to harm self or others  Life Context: Family and Social: Pt receives support from her daughter.  School/Work: Pt has income.  Self-Care: Pt denies substance use. Pt currently taking Klonopin to assist with anxiety. Life Changes: Pt has experienced physical health problems. Pt has difficulty with leaving the house due to worrying that something bad will happen to her health and feeling like a burden on a family due to difficulty walking.  Patient and/or Family's Strengths/Protective Factors: Concrete supports in place (healthy food, safe environments, etc.)  Goals Addressed: Patient will:  Reduce symptoms of: anxiety and depression   Increase knowledge and/or ability of: coping skills    Demonstrate ability to: Increase healthy adjustment to current life circumstances  Progress towards Goals: Ongoing  Interventions: Interventions utilized:  Mindfulness or Psychologist, educational, CBT Cognitive Behavioral Therapy, Supportive Counseling, and Psychoeducation and/or Health Education Standardized Assessments completed: C-SSRS Short, GAD-7, and PHQ 9 GAD 7 : Generalized Anxiety Score 06/28/2021 05/17/2021 11/14/2020 09/12/2020  Nervous, Anxious, on Edge 3 1 3 2   Control/stop worrying 3 1 0 1  Worry too much - different things 3 3 0 2  Trouble relaxing 1 2 1 1   Restless 0 1 0 1  Easily annoyed or irritable 1 3 1 1   Afraid - awful might happen 0 1 0 2  Total GAD 7 Score 11 12 5 10      Depression screen [facility] 2/9 06/28/2021 05/17/2021 11/14/2020 01/22/2019 12/03/2018  Decreased Interest 1 1 3 1 1   Down, Depressed, Hopeless 3 1 3 1 1   PHQ - 2 Score 4 2 6 2 2   Altered sleeping 3 3 3  0 1  Tired, decreased energy 3 3 3 1 1   Change in appetite 0 3 2 0 1  Feeling bad or failure about yourself  1 3 2 1 1   Trouble concentrating 0 1 1 0 0  Moving slowly or fidgety/restless 0 0 1 0 0  Suicidal thoughts 1 1 2  0 0  PHQ-9 Score 12 16 20 4 6    [place] from 06/28/2021 in [place] from 05/17/2021 in [place] ED to Hosp-Admission (Discharged) from 10/28/2018 in [facility] 4E CV SURGICAL PROGRESSIVE CARE  C-SSRS RISK CATEGORY Error: Q3, 4, or 5 should not be populated when Q2 is No No Risk No Risk  Patient and/or Family Response: Pt receptive to tx. Pt receptive to provided on depression and anxiety. Pt receptive to assistance with understanding more about the LGBTQ+ community. Pt will provide support and empathy to her daughter and granddaughter. Pt receptive to cognitive restructuring and assistance with cognitive processing skills. Pt will come back in office to see LCSWA  after next session in order to get out of the home more. Pt will utilize deep breathing exercises.   Patient Centered Plan: Patient is on the following Treatment Plan(s): Depression and anxiety  Assessment: Denies SI/HI. Denies auditory/visual hallucinations. No safety risks. Patient currently experiencing anxiety and depression related to physical health problems. Pt has difficulty with cognitive processing skills. Pt appears to frequently assume the worst and has difficulty with receiving the support/help that is currently in place with her family. Pt also has difficulty with understanding the LGBTQ+ community. Pt acknowledged difficulty due to religous views on same-sex relationships.   Patient may benefit from continued brief therapy to assist with adjusting to physical health problems. LCSWA provided psychoeducation on depression and anxiety. LCSWA provided empowerment to assist pt in understanding that she is entitled to her religous views. LCSWA provided information on the LGBTQ+ commuity and encouraged pt to support and empathize with her daughter. LCSWA encouraged utilized cognitive restructuring and assisted pt with cognitive processing skills. LCSWA encouraged pt to utilize deep breathing exercises, spend time on porch daily/open the blinds, and attend another session in person. LCSWA will fu with pt.  Plan: Follow up with behavioral health clinician on : 07/12/21 Behavioral recommendations: Utilize deep breathing exercises, spend time on porch daily/open the blinds, and attend another session in person. Utilize crisis resources if SI arises with plan, means, and intent. Adhere to medication. Referral(s): [facility] (In Clinic) "From scale of 1-10, how likely are you to follow plan?": 10  Claud Gowan C Alizah Sills, LCSW

## 2021-06-30 NOTE — Telephone Encounter (Signed)
Followed up with another phone call and she is aware of Doctors notes.

## 2021-06-30 NOTE — Telephone Encounter (Signed)
Trial cyclobenzaprine three times daily for body cramps, if worse may need to go to ED

## 2021-06-30 NOTE — Telephone Encounter (Signed)
Called pt but couldn't leave vm will give her a call after lunch.

## 2021-07-10 ENCOUNTER — Ambulatory Visit: Payer: Self-pay

## 2021-07-10 NOTE — Telephone Encounter (Signed)
Attempted to reach pt, second attempt. VM not set up.

## 2021-07-10 NOTE — Telephone Encounter (Signed)
Patient experiencing sore throat, cough for 3 days, no fever. Patient requesting penicillin        3 attempts to reach pt. "Voice mail not set up yet." Routing to provider for resolution per protocol.

## 2021-07-10 NOTE — Telephone Encounter (Signed)
Patient experiencing sore throat, cough for 3 days, no fever. Patient requesting penicillin    No answer. Voice mailbox not set up, unable to leave a message.

## 2021-07-11 ENCOUNTER — Telehealth (INDEPENDENT_AMBULATORY_CARE_PROVIDER_SITE_OTHER): Payer: Medicare Other | Admitting: Nurse Practitioner

## 2021-07-11 ENCOUNTER — Other Ambulatory Visit: Payer: Self-pay

## 2021-07-11 ENCOUNTER — Encounter: Payer: Self-pay | Admitting: Nurse Practitioner

## 2021-07-11 DIAGNOSIS — J069 Acute upper respiratory infection, unspecified: Secondary | ICD-10-CM | POA: Insufficient documentation

## 2021-07-11 MED ORDER — PROMETHAZINE-DM 6.25-15 MG/5ML PO SYRP: 2.5000 mL | ORAL_SOLUTION | Freq: Four times a day (QID) | ORAL | 0 refills | Status: DC | PRN

## 2021-07-11 MED ORDER — CEFDINIR 300 MG PO CAPS: 300.0000 mg | ORAL_CAPSULE | Freq: Two times a day (BID) | ORAL | 0 refills | Status: AC

## 2021-07-11 NOTE — Progress Notes (Signed)
Virtual Visit via Telephone Note  I connected with Bianca Murray on 07/11/21 at  1:40 PM EST by telephone and verified that I am speaking with the correct person using two identifiers.  Location: Patient: home Provider: office   I discussed the limitations, risks, security and privacy concerns of performing an evaluation and management service by telephone and the availability of in person appointments. I also discussed with the patient that there may be a patient responsible charge related to this service. The patient expressed understanding and agreed to proceed.   History of Present Illness:  Patient presents today for sick visit through telephone visit.  She states over the past few days she has been experiencing cough and sore throat.  She has also been having congestion.  She states that there have been other people that live in the home with her husband sick.  She did take a home COVID test which was negative.  Patient does have history of COPD is on inhalers. Denies f/c/s, n/v/d, hemoptysis, PND, chest pain or edema.       Observations/Objective:  Vitals with BMI 06/28/2021 06/21/2021 05/03/2021  Height - - -  Weight 217 lbs - -  BMI - - -  Systolic 409 811 914  Diastolic 88 62 80  Pulse 782 102 100      Assessment and Plan:  URI:  Stay well hydrated  Stay active  Deep breathing exercises  May take tylenol for fever or pain  May take mucinex twice daily  Will order cefdinir  Will order cough syrup   Follow up:  Follow up if needed    I discussed the assessment and treatment plan with the patient. The patient was provided an opportunity to ask questions and all were answered. The patient agreed with the plan and demonstrated an understanding of the instructions.   The patient was advised to call back or seek an in-person evaluation if the symptoms worsen or if the condition fails to improve as anticipated.  I provided 23 minutes of non-face-to-face  time during this encounter.   Fenton Foy, NP

## 2021-07-11 NOTE — Telephone Encounter (Signed)
Pt. Started coughing 3 days ago. Has light green mucus. Has mild shortness of breath, wheezing, sore throat. Requesting medications. History of COPD.Please advise pt. If there is a virtual visit for today.    Answer Assessment - Initial Assessment Questions 1. ONSET: "When did the cough begin?"      3 days 2. SEVERITY: "How bad is the cough today?"      Severe 3. SPUTUM: "Describe the color of your sputum" (none, dry cough; clear, white, yellow, green)     Light green 4. HEMOPTYSIS: "Are you coughing up any blood?" If so ask: "How much?" (flecks, streaks, tablespoons, etc.)     No 5. DIFFICULTY BREATHING: "Are you having difficulty breathing?" If Yes, ask: "How bad is it?" (e.g., mild, moderate, severe)    - MILD: No SOB at rest, mild SOB with walking, speaks normally in sentences, can lie down, no retractions, pulse < 100.    - MODERATE: SOB at rest, SOB with minimal exertion and prefers to sit, cannot lie down flat, speaks in phrases, mild retractions, audible wheezing, pulse 100-120.    - SEVERE: Very SOB at rest, speaks in single words, struggling to breathe, sitting hunched forward, retractions, pulse > 120      Mild 6. FEVER: "Do you have a fever?" If Yes, ask: "What is your temperature, how was it measured, and when did it start?"     No 7. CARDIAC HISTORY: "Do you have any history of heart disease?" (e.g., heart attack, congestive heart failure)      No 8. LUNG HISTORY: "Do you have any history of lung disease?"  (e.g., pulmonary embolus, asthma, emphysema)     COPD 9. PE RISK FACTORS: "Do you have a history of blood clots?" (or: recent major surgery, recent prolonged travel, bedridden)     No 10. OTHER SYMPTOMS: "Do you have any other symptoms?" (e.g., runny nose, wheezing, chest pain)       Runny nose, wheezing, sore throat 11. PREGNANCY: "Is there any chance you are pregnant?" "When was your last menstrual period?"       No 12. TRAVEL: "Have you traveled out of the country in  the last month?" (e.g., travel history, exposures)       No  Protocols used: Cough - Acute Productive-A-AH

## 2021-07-11 NOTE — Patient Instructions (Addendum)
URI:  Stay well hydrated  Stay active  Deep breathing exercises  May take tylenol for fever or pain  May take mucinex twice daily  Will order cefdinir  Will order cough syrup   Follow up:  Follow up if needed

## 2021-07-12 ENCOUNTER — Encounter: Payer: Medicare Other | Admitting: Clinical

## 2021-07-17 ENCOUNTER — Telehealth (HOSPITAL_COMMUNITY): Payer: Self-pay

## 2021-07-17 NOTE — Telephone Encounter (Signed)
[name] called me to report she is having flu like symptoms in which she was seen on a video visit for last week and prescribed anti-biotic and cough medicine and instructed to use OTC Mucinex. She reports she is not using cough syrup as often as prescribed and not taking the Mucinex. I advised her to be sure to pick up the Mucinex as this will help her healing process. She agreed. I also made sure she understood to complete entire dosing of antibiotic and to use her cough syrup as instructed and to hydrate and use Tylenol for pain and fever as needed. I also ensured her to use her normal prescribed meds and inhalers. She agreed and I will follow up with her next week in the home. Call complete.

## 2021-07-25 ENCOUNTER — Other Ambulatory Visit: Payer: Self-pay | Admitting: Critical Care Medicine

## 2021-07-25 ENCOUNTER — Ambulatory Visit: Payer: Medicare Other | Admitting: Critical Care Medicine

## 2021-07-25 ENCOUNTER — Telehealth (INDEPENDENT_AMBULATORY_CARE_PROVIDER_SITE_OTHER): Payer: Self-pay | Admitting: Critical Care Medicine

## 2021-07-25 NOTE — Telephone Encounter (Signed)
Requested Prescriptions  Pending Prescriptions Disp Refills   cyclobenzaprine (FLEXERIL) 10 MG tablet [Pharmacy Med Name: CYCLOBENZAPRINE HCL 10 MG ORAL TABLET] 60 tablet 1    Sig: TAKE 1 TABLET (10 MG TOTAL) BY MOUTH 3 (THREE) TIMES DAILY AS NEEDED FOR MUSCLE SPASMS.     Not Delegated - Analgesics:  Muscle Relaxants Failed - 07/25/2021 11:58 AM      Failed - This refill cannot be delegated      Passed - Valid encounter within last 6 months    Recent Outpatient Visits          3 weeks ago Impaired mobility and personal care   [place] Elsie Stain, MD   2 months ago Mixed simple and mucopurulent chronic bronchitis [facility])   [place] Elsie Stain, MD   4 months ago Need for vaccination for Strep pneumoniae   [place] Elsie Stain, MD   6 months ago Mixed simple and mucopurulent chronic bronchitis [facility])   [place] Elsie Stain, MD   8 months ago Centrilobular emphysema [facility])   [place] Elsie Stain, MD      Future Appointments            In 2 months Elsie Stain, MD [place]            VENTOLIN HFA 108 [phone] Base) MCG/ACT inhaler [Pharmacy Med Name: VENTOLIN HFA 108 (90 BASE) MCG/ACT INHALATION AEROSOL SOLUTION] 18 g 0    Sig: INHALE TWO PUFFS BY MOUTH EVERY SIX HOURS AS NEEDED FOR WHEEZING OR SHORTNESS OF BREATH     Pulmonology:  Beta Agonists Failed - 07/25/2021 11:58 AM      Failed - One inhaler should last at least one month. If the patient is requesting refills earlier, contact the patient to check for uncontrolled symptoms.      Passed - Valid encounter within last 12 months    Recent Outpatient Visits          3 weeks ago Impaired mobility and personal care   [place] Elsie Stain, MD   2 months ago Mixed  simple and mucopurulent chronic bronchitis [facility])   [place] Elsie Stain, MD   4 months ago Need for vaccination for Strep pneumoniae   [place] Elsie Stain, MD   6 months ago Mixed simple and mucopurulent chronic bronchitis [facility])   [place] Elsie Stain, MD   8 months ago Centrilobular emphysema [facility])   [place], MD      Future Appointments            In 2 months Elsie Stain, MD [place]

## 2021-07-25 NOTE — Telephone Encounter (Signed)
Copied from [place] [id number]. Topic: General - Inquiry >> Jul 25, 2021  9:48 AM Oneta Rack wrote: June PT from [place] [phone]. Patient had flu for a couple of weeks and and was unable to keep PT appointment. Calling requesting verbal orders for 2x 2 to continue plan care starting next week.

## 2021-07-25 NOTE — Telephone Encounter (Signed)
Requested medication (s) are due for refill today: no  Requested medication (s) are on the active medication list: yes  Last refill:  06/30/21 #60/1RF  Future visit scheduled: yes  Notes to clinic:  Unable to refill per protocol, cannot delegate.      Requested Prescriptions  Pending Prescriptions Disp Refills   cyclobenzaprine (FLEXERIL) 10 MG tablet [Pharmacy Med Name: CYCLOBENZAPRINE HCL 10 MG ORAL TABLET] 60 tablet 1    Sig: TAKE 1 TABLET (10 MG TOTAL) BY MOUTH 3 (THREE) TIMES DAILY AS NEEDED FOR MUSCLE SPASMS.     Not Delegated - Analgesics:  Muscle Relaxants Failed - 07/25/2021 11:58 AM      Failed - This refill cannot be delegated      Passed - Valid encounter within last 6 months    Recent Outpatient Visits           3 weeks ago Impaired mobility and personal care   [place] Elsie Stain, MD   2 months ago Mixed simple and mucopurulent chronic bronchitis ([place])   [place] Elsie Stain, MD   4 months ago Need for vaccination for Strep pneumoniae   [place] Elsie Stain, MD   6 months ago Mixed simple and mucopurulent chronic bronchitis [facility])   [place] Elsie Stain, MD   8 months ago Centrilobular emphysema [facility])   [place] Elsie Stain, MD       Future Appointments             In 2 months Elsie Stain, MD [place]            Signed Prescriptions Disp Refills   VENTOLIN HFA 108 (90 Base) MCG/ACT inhaler 18 g 0    Sig: INHALE TWO PUFFS BY MOUTH EVERY SIX HOURS AS NEEDED FOR WHEEZING OR SHORTNESS OF BREATH     Pulmonology:  Beta Agonists Failed - 07/25/2021 11:58 AM      Failed - One inhaler should last at least one month. If the patient is requesting refills earlier, contact the patient to check for uncontrolled symptoms.       Passed - Valid encounter within last 12 months    Recent Outpatient Visits           3 weeks ago Impaired mobility and personal care   [place] Elsie Stain, MD   2 months ago Mixed simple and mucopurulent chronic bronchitis [facility])   [place] Elsie Stain, MD   4 months ago Need for vaccination for Strep pneumoniae   [place] Elsie Stain, MD   6 months ago Mixed simple and mucopurulent chronic bronchitis [place] [facility])   [place] Elsie Stain, MD   8 months ago Centrilobular emphysema [facility])   [place], MD       Future Appointments             In 2 months Elsie Stain, MD [place]

## 2021-07-26 ENCOUNTER — Telehealth (HOSPITAL_COMMUNITY): Payer: Self-pay

## 2021-07-26 NOTE — Telephone Encounter (Signed)
Spoke to Bianca Murray attempting to schedule home visit for paramedicine visit. She reports she wants to reschedule for next week as she did not get any sleep last night. I updated her on the status of her wheel chair being in process. She was grateful for update and plans for home visit next week. Call complete.

## 2021-07-27 ENCOUNTER — Telehealth: Payer: Self-pay

## 2021-07-27 ENCOUNTER — Ambulatory Visit: Payer: Self-pay | Admitting: *Deleted

## 2021-07-27 NOTE — Telephone Encounter (Signed)
3rd attempt to call patient- constant busy signal x 2. Notification sent to office

## 2021-07-27 NOTE — Telephone Encounter (Signed)
Message received from [facility] regarding power wheelchair:  She was approved by insurance and we ordered her chair on 07/20/21. I will try to get an estimate on when it will be here but there have been long delays with shipping.

## 2021-07-27 NOTE — Telephone Encounter (Signed)
Summary: Patient with cold again after 2 weeks needing medication   Patient called in to speak to a nurse say that she have a cold again said she had one about 2 weeks ago and is very concerned. Would like to speak to a nurse please Ph# [phone]     Attempted to call patient- no answer and voice mail not set up- unable to leave call back message

## 2021-07-27 NOTE — Telephone Encounter (Signed)
Second attempt to reach patient- no answer- same message- VM not set up.

## 2021-07-28 ENCOUNTER — Other Ambulatory Visit: Payer: Self-pay

## 2021-07-28 ENCOUNTER — Encounter: Payer: Self-pay | Admitting: Nurse Practitioner

## 2021-07-28 ENCOUNTER — Telehealth (INDEPENDENT_AMBULATORY_CARE_PROVIDER_SITE_OTHER): Payer: Medicare Other | Admitting: Nurse Practitioner

## 2021-07-28 DIAGNOSIS — J069 Acute upper respiratory infection, unspecified: Secondary | ICD-10-CM | POA: Diagnosis not present

## 2021-07-28 MED ORDER — PROMETHAZINE-DM 6.25-15 MG/5ML PO SYRP
2.5000 mL | ORAL_SOLUTION | Freq: Four times a day (QID) | ORAL | 0 refills | Status: DC | PRN
Start: 2021-07-28 — End: 2021-08-16

## 2021-07-28 MED ORDER — DOXYCYCLINE HYCLATE 100 MG PO TABS: 100.0000 mg | ORAL_TABLET | Freq: Two times a day (BID) | ORAL | 0 refills | Status: AC

## 2021-07-28 NOTE — Progress Notes (Signed)
Virtual Visit via Telephone Note  I connected with Bianca Murray on 07/28/21 at 10:40 AM EST by telephone and verified that I am speaking with the correct person using two identifiers.  Location: Patient: home Provider: office   I discussed the limitations, risks, security and privacy concerns of performing an evaluation and management service by telephone and the availability of in person appointments. I also discussed with the patient that there may be a patient responsible charge related to this service. The patient expressed understanding and agreed to proceed.   History of Present Illness:  Patient presents today for sick visit through telephone visit.  She states that she has been sick for the past couple weeks with cough and chest congestion.  Patient does have a history of COPD.  She was given cefdinir after Thanksgiving.  She states that her symptoms did improve but came back shortly after finishing the antibiotic.  Patient does have what sounds to be a very congested cough over the phone.  We discussed that there is a concern for bronchitis versus pneumonia.  We will order doxycycline and if symptoms do not improve over the weekend she can go to the ED if needed for chest x-ray.  Patient has taken a home COVID test that was negative.  Denies f/c/s, n/v/d, hemoptysis, PND, chest pain or edema.       Observations/Objective:  Vitals with BMI 06/28/2021 06/21/2021 05/03/2021  Height - - -  Weight 217 lbs - -  BMI - - -  Systolic 161 096 045  Diastolic 88 62 80  Pulse 409 102 100       Assessment and Plan:  URI Cough:  Concerned for bronchitis vs pneumonia   Stay well hydrated  Stay active  Deep breathing exercises  May take tylenol or fever or pain  May take mucinex  twice daily  Will order doxycycline     Follow up:  Follow up in 2 weeks or sooner if needed      I discussed the assessment and treatment plan with the patient. The patient was provided  an opportunity to ask questions and all were answered. The patient agreed with the plan and demonstrated an understanding of the instructions.   The patient was advised to call back or seek an in-person evaluation if the symptoms worsen or if the condition fails to improve as anticipated.  I provided 23 minutes of non-face-to-face time during this encounter.   Fenton Foy, NP

## 2021-07-28 NOTE — Patient Instructions (Signed)
URI Cough:  Concerned for bronchitis vs pneumonia   Stay well hydrated  Stay active  Deep breathing exercises  May take tylenol or fever or pain  May take mucinex  twice daily  Will order doxycycline     Follow up:  Follow up in 2 weeks or sooner if needed

## 2021-07-28 NOTE — Telephone Encounter (Signed)
Reason for Disposition  [1] Continuous (nonstop) coughing interferes with work or school AND [2] no improvement using cough treatment per Care Advice  Answer Assessment - Initial Assessment Questions 1. ONSET: "When did the cough begin?"      2 weeks 2. SEVERITY: "How bad is the cough today?"      Bad- cough causes incontinent  3. SPUTUM: "Describe the color of your sputum" (none, dry cough; clear, white, yellow, green)     Clear with green sputum 4. HEMOPTYSIS: "Are you coughing up any blood?" If so ask: "How much?" (flecks, streaks, tablespoons, etc.)     no 5. DIFFICULTY BREATHING: "Are you having difficulty breathing?" If Yes, ask: "How bad is it?" (e.g., mild, moderate, severe)    - MILD: No SOB at rest, mild SOB with walking, speaks normally in sentences, can lie down, no retractions, pulse < 100.    - MODERATE: SOB at rest, SOB with minimal exertion and prefers to sit, cannot lie down flat, speaks in phrases, mild retractions, audible wheezing, pulse 100-120.    - SEVERE: Very SOB at rest, speaks in single words, struggling to breathe, sitting hunched forward, retractions, pulse > 120      mild 6. FEVER: "Do you have a fever?" If Yes, ask: "What is your temperature, how was it measured, and when did it start?"     no 7. CARDIAC HISTORY: "Do you have any history of heart disease?" (e.g., heart attack, congestive heart failure)      *No Answer* 8. LUNG HISTORY: "Do you have any history of lung disease?"  (e.g., pulmonary embolus, asthma, emphysema)     *No Answer* 9. PE RISK FACTORS: "Do you have a history of blood clots?" (or: recent major surgery, recent prolonged travel, bedridden)     *No Answer* 10. OTHER SYMPTOMS: "Do you have any other symptoms?" (e.g., runny nose, wheezing, chest pain)       *No Answer* 11. PREGNANCY: "Is there any chance you are pregnant?" "When was your last menstrual period?"       *No Answer* 12. TRAVEL: "Have you traveled out of the country in the last  month?" (e.g., travel history, exposures)       *No Answer*  Answer Assessment - Initial Assessment Questions 1. LOCATION: "Where does it hurt?"      No pain 2. ONSET: "When did the sinus pain start?"  (e.g., hours, days)      na 3. SEVERITY: "How bad is the pain?"   (Scale 1-10; mild, moderate or severe)   - MILD (1-3): doesn't interfere with normal activities    - MODERATE (4-7): interferes with normal activities (e.g., work or school) or awakens from sleep   - SEVERE (8-10): excruciating pain and patient unable to do any normal activities        na 4. RECURRENT SYMPTOM: "Have you ever had sinus problems before?" If Yes, ask: "When was the last time?" and "What happened that time?"      URI- after Thanksgiving- patient was treated with antibiotics and her symptoms improved- patient states her symptoms are coming back 5. NASAL CONGESTION: "Is the nose blocked?" If Yes, ask: "Can you open it or must you breathe through your mouth?"     no 6. NASAL DISCHARGE: "Do you have discharge from your nose?" If so ask, "What color?"     no 7. FEVER: "Do you have a fever?" If Yes, ask: "What is it, how was it measured, and when did it  start?"      no 8. OTHER SYMPTOMS: "Do you have any other symptoms?" (e.g., sore throat, cough, earache, difficulty breathing)     Cough with chest congestion 9. PREGNANCY: "Is there any chance you are pregnant?" "When was your last menstrual period?"     na  Protocols used: Sinus Pain or Congestion-A-AH, Cough - Acute Productive-A-AH

## 2021-07-28 NOTE — Telephone Encounter (Signed)
°  Chief Complaint: URI return Symptoms: cough congestion Frequency: 2 weeks Pertinent Negatives: Patient denies fever Disposition: [] ED /[] Urgent Care (no appt availability in office) / [] Appointment(In office/virtual)/ []  [facility]/ [] Home Care/ [x] Refused Recommended Disposition  Additional Notes: Patient states her PCP does not want her going in public and request cough medication and antibiotic be sent in for her- advised she may need to be assessed at office.

## 2021-07-28 NOTE — Telephone Encounter (Signed)
Scheduled virtual appt with Provider Nils Pyle for today.

## 2021-07-30 ENCOUNTER — Encounter: Payer: Self-pay | Admitting: Critical Care Medicine

## 2021-07-30 ENCOUNTER — Telehealth: Payer: Medicare Other | Admitting: Family

## 2021-07-30 DIAGNOSIS — U071 COVID-19: Secondary | ICD-10-CM | POA: Diagnosis not present

## 2021-07-30 MED ORDER — ALBUTEROL SULFATE HFA 108 (90 BASE) MCG/ACT IN AERS: 2.0000 | INHALATION_SPRAY | Freq: Four times a day (QID) | RESPIRATORY_TRACT | 0 refills | Status: DC | PRN

## 2021-07-30 MED ORDER — BENZONATATE 100 MG PO CAPS: 100.0000 mg | ORAL_CAPSULE | Freq: Three times a day (TID) | ORAL | 0 refills | Status: DC | PRN

## 2021-07-30 MED ORDER — FLUTICASONE PROPIONATE 50 MCG/ACT NA SUSP: 2.0000 | Freq: Every day | NASAL | 6 refills | Status: DC

## 2021-07-30 MED ORDER — MOLNUPIRAVIR EUA 200MG CAPSULE: 4.0000 | ORAL_CAPSULE | Freq: Two times a day (BID) | ORAL | 0 refills | Status: AC

## 2021-07-30 NOTE — Telephone Encounter (Signed)
Outpatient Oral COVID Treatment Note  I connected with Bianca Murray on 07/30/2021/5:46 PM by telephone and verified that I am speaking with the correct person using two identifiers.  I discussed the limitations, risks, security, and privacy concerns of performing an evaluation and management service by telephone and the availability of in person appointments. I also discussed with the patient that there may be a patient responsible charge related to this service. The patient expressed understanding and agreed to proceed.  Patient location: home  Provider location: my home   Diagnosis: COVID-19 infection  Purpose of visit: Discussion of potential use of Molnupiravir or Paxlovid, a new treatment for mild to moderate COVID-19 viral infection in non-hospitalized patients.   Subjective: Patient is a 65 y.o. female who has been diagnosed with COVID 19 viral infection.  Their symptoms began on 12/17 with cough short of breath.    Past Medical History:  Diagnosis Date   Arthritis    Asthma    Cancer ([place])    Chronic respiratory failure with hypoxia and hypercapnia (HCC)    COPD (chronic obstructive pulmonary disease) (HCC)    Hypertension     No Known Allergies   Current Outpatient Medications:    molnupiravir EUA (LAGEVRIO) 200 mg CAPS capsule, Take 4 capsules (800 mg total) by mouth 2 (two) times daily for 5 days., Disp: 40 capsule, Rfl: 0   albuterol (PROVENTIL) (2.5 MG/3ML) 0.083% nebulizer solution, Take 3 mLs (2.5 mg total) by nebulization 3 (three) times daily., Disp: 270 mL, Rfl: 1   albuterol (VENTOLIN HFA) 108 (90 Base) MCG/ACT inhaler, Inhale 2 puffs into the lungs every 6 (six) hours as needed for wheezing or shortness of breath., Disp: 8 g, Rfl: 0   amLODipine (NORVASC) 5 MG tablet, TAKE 1 TABLET(5 MG) BY MOUTH DAILY, Disp: 90 tablet, Rfl: 1   atorvastatin (LIPITOR) 10 MG tablet, Take 1 tablet (10 mg total) by mouth daily., Disp: 90 tablet, Rfl: 3   azelastine (ASTELIN) 0.1 %  nasal spray, Place 2 sprays into both nostrils 2 (two) times daily. Use in each nostril as directed, Disp: 30 mL, Rfl: 12   benzonatate (TESSALON PERLES) 100 MG capsule, Take 1 capsule (100 mg total) by mouth 3 (three) times daily as needed., Disp: 20 capsule, Rfl: 0   Blood Pressure Monitoring (BLOOD PRESSURE MONITOR AUTOMAT) DEVI, Measure blood pressure and pulse daily, Disp: 1 Device, Rfl: 0   Budeson-Glycopyrrol-Formoterol (BREZTRI AEROSPHERE) 160-9-4.8 MCG/ACT AERO, Inhale 2 puffs into the lungs in the morning and at bedtime., Disp: 10.7 g, Rfl: 6   cyclobenzaprine (FLEXERIL) 10 MG tablet, Take 1 tablet (10 mg total) by mouth 3 (three) times daily as needed for muscle spasms., Disp: 60 tablet, Rfl: 1   doxycycline (VIBRA-TABS) 100 MG tablet, Take 1 tablet (100 mg total) by mouth 2 (two) times daily for 10 days., Disp: 20 tablet, Rfl: 0   fluticasone (FLONASE) 50 MCG/ACT nasal spray, Place 2 sprays into both nostrils daily., Disp: 16 g, Rfl: 6   gabapentin (NEURONTIN) 100 MG capsule, Take 1 capsule (100 mg total) by mouth 3 (three) times daily., Disp: 90 capsule, Rfl: 0   ibuprofen (IBU) 600 MG tablet, TAKE 1 TABLET(600 MG) BY MOUTH EVERY 6 HOURS AS NEEDED, Disp: 90 tablet, Rfl: 0   loratadine (ALLERGY RELIEF) 10 MG tablet, TAKE ONE TABLET BY MOUTH ONCE DAILY (AM), Disp: 90 tablet, Rfl: 0   losartan (COZAAR) 100 MG tablet, Take 1 tablet (100 mg total) by mouth daily., Disp: 90  tablet, Rfl: 2   Misc. Devices (PULSE OXIMETER FOR FINGER) MISC, 1 application by Does not apply route daily as needed., Disp: 1 each, Rfl: 0   Misc. Devices MISC, Nebulizer machine, Large shower chair.  Diagnosis- chronic respiratory failure, Disp: 1 each, Rfl: 0   pantoprazole (PROTONIX) 40 MG tablet, TAKE 1 TABLET BY MOUTH 2 (TWO) TIMES DAILY BEFORE A MEAL (AM+EVENING), Disp: 60 tablet, Rfl: 2   polyethylene glycol powder (GLYCOLAX/MIRALAX) 17 GM/SCOOP powder, Take 17 g by mouth daily as needed., Disp: 507 g, Rfl: 1    promethazine-dextromethorphan (PROMETHAZINE-DM) 6.25-15 MG/5ML syrup, Take 2.5 mLs by mouth 4 (four) times daily as needed for cough., Disp: 118 mL, Rfl: 0   Spacer/Aero-Holding Chambers (AEROCHAMBER MV) inhaler, Use as instructed, Disp: 1 each, Rfl: 0   VENTOLIN HFA 108 (90 Base) MCG/ACT inhaler, INHALE TWO PUFFS BY MOUTH EVERY SIX HOURS AS NEEDED FOR WHEEZING OR SHORTNESS OF BREATH, Disp: 18 g, Rfl: 0   Vitamin D, Ergocalciferol, (DRISDOL) 1.25 MG (50000 UNIT) CAPS capsule, Take 1 capsule (50,000 Units total) by mouth every 7 (seven) days., Disp: 12 capsule, Rfl: 1  Objective: Patient appears/sounds short of breath.  They are in no apparent distress.  Breathing is non labored.  Mood and behavior are normal.  Laboratory Data:  Recent Results (from the past 2160 hour(s))  Comprehensive metabolic panel     Status: Abnormal   Collection Time: 06/28/21 11:29 AM  Result Value Ref Range   Glucose 114 (H) 70 - 99 mg/dL   BUN 11 8 - 27 mg/dL   Creatinine, Ser 0.84 0.57 - 1.00 mg/dL   eGFR 77 >59 mL/min/1.73   BUN/Creatinine Ratio 13 12 - 28   Sodium 145 (H) 134 - 144 mmol/L   Potassium 4.4 3.5 - 5.2 mmol/L   Chloride 99 96 - 106 mmol/L   CO2 29 20 - 29 mmol/L   Calcium 9.8 8.7 - 10.3 mg/dL   Total Protein 7.3 6.0 - 8.5 g/dL   Albumin 4.7 3.8 - 4.8 g/dL   Globulin, Total 2.6 1.5 - 4.5 g/dL   Albumin/Globulin Ratio 1.8 1.2 - 2.2   Bilirubin Total <0.2 0.0 - 1.2 mg/dL   Alkaline Phosphatase 80 44 - 121 IU/L   AST 13 0 - 40 IU/L   ALT 9 0 - 32 IU/L  CBC with Differential/Platelet     Status: None   Collection Time: 06/28/21 11:29 AM  Result Value Ref Range   WBC 7.0 3.4 - 10.8 x10E3/uL   RBC 4.00 3.77 - 5.28 x10E6/uL   Hemoglobin 11.2 11.1 - 15.9 g/dL   Hematocrit 34.2 34.0 - 46.6 %   MCV 86 79 - 97 fL   MCH 28.0 26.6 - 33.0 pg   MCHC 32.7 31.5 - 35.7 g/dL   RDW 13.0 11.7 - 15.4 %   Platelets 299 150 - 450 x10E3/uL   Neutrophils 77 Not Estab. %   Lymphs 14 Not Estab. %   Monocytes 7  Not Estab. %   Eos 1 Not Estab. %   Basos 1 Not Estab. %   Neutrophils Absolute 5.4 1.4 - 7.0 x10E3/uL   Lymphocytes Absolute 1.0 0.7 - 3.1 x10E3/uL   Monocytes Absolute 0.5 0.1 - 0.9 x10E3/uL   EOS (ABSOLUTE) 0.1 0.0 - 0.4 x10E3/uL   Basophils Absolute 0.0 0.0 - 0.2 x10E3/uL   Immature Granulocytes 0 Not Estab. %   Immature Grans (Abs) 0.0 0.0 - 0.1 x10E3/uL  Lipid panel  Status: Abnormal   Collection Time: 06/28/21 11:29 AM  Result Value Ref Range   Cholesterol, Total 343 (H) 100 - 199 mg/dL   Triglycerides 61 0 - 149 mg/dL   HDL 135 >39 mg/dL   VLDL Cholesterol Cal 7 5 - 40 mg/dL   LDL Chol Calc (NIH) 201 (H) 0 - 99 mg/dL   Lipid Comment: Comment     Comment: Possible Familial Hypercholesterolemia. FH should be suspected when fasting LDL cholesterol is above 189 mg/dL or non-HDL cholesterol is above 219 mg/dL. A family history of high cholesterol and heart disease in 1st degree relatives should be collected. J Clin Lipidol 2011;5:133-140    Chol/HDL Ratio 2.5 0.0 - 4.4 ratio    Comment:                                   T. Chol/HDL Ratio                                             Men  Women                               1/2 Avg.Risk  3.4    3.3                                   Avg.Risk  5.0    4.4                                2X Avg.Risk  9.6    7.1                                3X Avg.Risk 23.4   11.0      Assessment: 65 y.o. female with mild/moderate COVID 19 viral infection diagnosed on 07/30/21 at high risk for progression to severe COVID 19.  Plan:  This patient is a 65 y.o. female that meets the following criteria for Emergency Use Authorization of: Molnupiravir  Age >18 yr SARS-COV-2 positive test Symptom onset < 5 days Mild-to-moderate COVID disease with high risk for severe progression to hospitalization or death  I have spoken and communicated the following to the patient or parent/caregiver regarding: Molnupiravir is an unapproved drug that is  authorized for use under an Emergency Use Authorization.  There are no adequate, approved, available products for the treatment of COVID-19 in adults who have mild-to-moderate COVID-19 and are at high risk for progressing to severe COVID-19, including hospitalization or death. Other therapeutics are currently authorized. For additional information on all products authorized for treatment or prevention of COVID-19, please see [employer].  There are benefits and risks of taking this treatment as outlined in the Fact Sheet for Patients and Caregivers.  Fact Sheet for Patients and Caregivers was reviewed with patient. A hard copy will be provided to patient from pharmacy prior to the patient receiving treatment. Patients should continue to self-isolate and use infection control measures (e.g., wear mask, isolate, social distance, avoid sharing personal items, clean and disinfect high touch surfaces, and frequent handwashing) according to  CDC guidelines.  The patient or parent/caregiver has the option to accept or refuse treatment. [place] has established a pregnancy surveillance program. Females of childbearing potential should use a reliable method of contraception correctly and consistently, as applicable, for the duration of treatment and for 4 days after the last dose of Molnupiravir. Males of reproductive potential who are sexually active with females of childbearing potential should use a reliable method of contraception correctly and consistently during treatment and for at least 3 months after the last dose. Pregnancy status and risk was assessed. Patient verbalized understanding of precautions.  After reviewing above information with the patient, the patient agrees to receive molnupiravir.  Follow up instructions:    Take prescription BID x 5 days as directed Reach  out to pharmacist for counseling on medication if desired For concerns regarding further COVID symptoms please follow up with your PCP or urgent care For urgent or life-threatening issues, seek care at your local emergency department  The patient was provided an opportunity to ask questions, and all were answered. The patient agreed with the plan and demonstrated an understanding of the instructions.   Script sent to [facility] and opted to pick up RX.  The patient was advised to call their PCP or seek an in-person evaluation if the symptoms worsen or if the condition fails to improve as anticipated.   I provided 10 minutes of non face-to-face telephone visit time during this encounter, and > 50% was spent counseling as documented under my assessment & plan.  Asencion Noble, MD 07/30/2021 /5:46 PM

## 2021-07-30 NOTE — Progress Notes (Signed)
E-Visit  for Positive Covid Test Result  We are sorry you are not feeling well. We are here to help!  You have tested positive for COVID-19, meaning that you were infected with the novel coronavirus and could give the virus to others.  It is vitally important that you stay home so you do not spread it to others.      Please continue isolation at home, for at least 10 days since the start of your symptoms and until you have had 24 hours with no fever (without taking a fever reducer) and with improving of symptoms.  If you have no symptoms but tested positive (or all symptoms resolve after 5 days and you have no fever) you can leave your house but continue to wear a mask around others for an additional 5 days. If you have a fever,continue to stay home until you have had 24 hours of no fever. Most cases improve 5-10 days from onset but we have seen a small number of patients who have gotten worse after the 10 days.  Please be sure to watch for worsening symptoms and remain taking the proper precautions.   Go to the nearest hospital ED for assessment if fever/cough/breathlessness are severe or illness seems like a threat to life.    The following symptoms may appear 2-14 days after exposure: Fever Cough Shortness of breath or difficulty breathing Chills Repeated shaking with chills Muscle pain Headache Sore throat New loss of taste or smell Fatigue Congestion or runny nose Nausea or vomiting Diarrhea  You have been enrolled in [place] for COVID-19. Daily you will receive a questionnaire within the [employer] website. Our COVID-19 response team will be monitoring your responses daily.  You can use medication such as prescription cough medication called Tessalon Perles 100 mg. You may take 1-2 capsules every 8 hours as needed for cough,  prescription inhaler called Albuterol MDI 90 mcg /actuation 2 puffs every 4 hours as needed for shortness of breath, wheezing, cough, and  prescription for Fluticasone nasal spray 2 sprays in each nostril one time per day.  You need to submit a video visit for antiviral. I do recommend you do a video visit given your age.  You may also take acetaminophen (Tylenol) as needed for fever.  HOME CARE: Only take medications as instructed by your medical team. Drink plenty of fluids and get plenty of rest. A steam or ultrasonic humidifier can help if you have congestion.   GET HELP RIGHT AWAY IF YOU HAVE EMERGENCY WARNING SIGNS.  Call 911 or proceed to your closest emergency facility if: You develop worsening high fever. Trouble breathing Bluish lips or face Persistent pain or pressure in the chest New confusion Inability to wake or stay awake You cough up blood. Your symptoms become more severe Inability to hold down food or fluids  This list is not all possible symptoms. Contact your medical provider for any symptoms that are severe or concerning to you.    Your e-visit answers were reviewed by a board certified advanced clinical practitioner to complete your personal care plan.  Depending on the condition, your plan could have included both over the counter or prescription medications.  If there is a problem please reply once you have received a response from your provider.  Your safety is important to [place].  If you have drug allergies check your prescription carefully.    You can use MyChart to ask questions about today's visit, request a  non-urgent call back, or ask for a work or school excuse for 24 hours related to this e-Visit. If it has been greater than 24 hours you will need to follow up with your provider, or enter a new e-Visit to address those concerns. You will get an e-mail in the next two days asking about your experience.  I hope that your e-visit has been valuable and will speed your recovery. Thank you for using e-visits.    Approximately 5 minutes was spent documenting and reviewing patient's chart.

## 2021-07-31 ENCOUNTER — Ambulatory Visit: Payer: Medicare Other | Admitting: Clinical

## 2021-08-02 ENCOUNTER — Telehealth (HOSPITAL_COMMUNITY): Payer: Self-pay

## 2021-08-02 NOTE — Telephone Encounter (Signed)
Bianca Murray called to cancel our home visit scheduled for today as she reports she is covid + and does not want to have home visitors. We will attempt to reschedule in the coming weeks. Call complete.

## 2021-08-04 ENCOUNTER — Ambulatory Visit: Payer: Self-pay

## 2021-08-04 NOTE — Telephone Encounter (Addendum)
Pt was diagnosed with on Saturday / pt is experiencing chest congestion / pt was given an Rx from Dr Joya Gaskins / pt is asking if she can get something else to help get rid of this congestion or a refill of molnupiravir EUA (LAGEVRIO) 200 mg CAPS capsule     Unable to reach pt. Called daughter's numbe - voice mailbox is full.  Second attempt to reach pt. Unable to get call through.

## 2021-08-04 NOTE — Telephone Encounter (Signed)
Third attempt to reach patient- patient number not in service. Attempted to contact EC - no answer and VM is full

## 2021-08-10 ENCOUNTER — Ambulatory Visit: Payer: Self-pay

## 2021-08-10 NOTE — Telephone Encounter (Signed)
2nd attempt, pt called, still getting fast busy tone.

## 2021-08-10 NOTE — Telephone Encounter (Signed)
Unable to reach patient after 3 attempts by [facility] NT, routing to the provider for resolution per protocol.

## 2021-08-10 NOTE — Telephone Encounter (Signed)
Pt called, unable to get through d/t fast busy tone. Will try again.   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 She want to know what she an do or if meds can be sent. She didn't want an appt. Pharmacy, [place], [place]  Phone: [phone]  Fax: [phone]

## 2021-08-11 NOTE — Telephone Encounter (Signed)
Pls add her on as a virtual visit for Tuesday, go to [facility] if worse over weekend,  note we are closed Monday

## 2021-08-13 DIAGNOSIS — Z9981 Dependence on supplemental oxygen: Secondary | ICD-10-CM | POA: Insufficient documentation

## 2021-08-16 ENCOUNTER — Ambulatory Visit: Payer: Self-pay | Admitting: *Deleted

## 2021-08-16 ENCOUNTER — Other Ambulatory Visit: Payer: Self-pay

## 2021-08-16 ENCOUNTER — Ambulatory Visit: Payer: Medicare Other | Admitting: Physician Assistant

## 2021-08-16 ENCOUNTER — Telehealth (HOSPITAL_COMMUNITY): Payer: Self-pay

## 2021-08-16 DIAGNOSIS — J418 Mixed simple and mucopurulent chronic bronchitis: Secondary | ICD-10-CM | POA: Diagnosis not present

## 2021-08-16 DIAGNOSIS — J069 Acute upper respiratory infection, unspecified: Secondary | ICD-10-CM | POA: Diagnosis not present

## 2021-08-16 DIAGNOSIS — R051 Acute cough: Secondary | ICD-10-CM

## 2021-08-16 DIAGNOSIS — U071 COVID-19: Secondary | ICD-10-CM

## 2021-08-16 MED ORDER — PROMETHAZINE-DM 6.25-15 MG/5ML PO SYRP: 2.5000 mL | ORAL_SOLUTION | Freq: Four times a day (QID) | ORAL | 0 refills | Status: DC | PRN

## 2021-08-16 MED ORDER — BENZONATATE 100 MG PO CAPS: 100.0000 mg | ORAL_CAPSULE | Freq: Three times a day (TID) | ORAL | 0 refills | Status: DC | PRN

## 2021-08-16 NOTE — Telephone Encounter (Signed)
[place], [place] to reach out to patient and schedule her for a virtual visit with the [facility] this afternoon.

## 2021-08-16 NOTE — Progress Notes (Signed)
Patient has not eaten or taken medication today Patient denies N/V/Diarrhea. Patient reports chest congestion with no productive cough.

## 2021-08-16 NOTE — Telephone Encounter (Signed)
Spoke to [place] who reports she is continuing to have fever, chills, sinus drainage, ear pain and coughing. She has not tested for COVID but wishes to have a virtual visit with Dr. Joya Gaskins.   I see they had attempted to reach her last week but she didn't answer. I told her about same and she agreed her phone was off. I will reach out to [facility] and attempt to get her scheduled for a virtual visit with Dr. Joya Gaskins.

## 2021-08-16 NOTE — Telephone Encounter (Signed)
2nd attempt. "Call can not be completed." Rechecked number , only number provided.

## 2021-08-16 NOTE — Patient Instructions (Addendum)
I encourage you to start using your breathing treatments 3 times a day as we discussed.  I sent over refills of the cough medications to hopefully give you some relief.  We will follow-up with you Tuesday at 8:20 AM with another virtual visit.  I hope that you feel better soon  [name], PA-C Physician Assistant [facility] [email or web address]   Cough, Adult Coughing is a reflex that clears your throat and your airways (respiratory system). Coughing helps to heal and protect your lungs. It is normal to cough occasionally, but a cough that happens with other symptoms or lasts a long time may be a sign of a condition that needs treatment. An acute cough may only last 2-3 weeks, while a chronic cough may last 8 or more weeks. Coughing is commonly caused by: Infection of the respiratory systemby viruses or bacteria. Breathing in substances that irritate your lungs. Allergies. Asthma. Mucus that runs down the back of your throat (postnasal drip). Smoking. Acid backing up from the stomach into the esophagus (gastroesophageal reflux). Certain medicines. Chronic lung problems. Other medical conditions such as heart failure or a blood clot in the lung (pulmonary embolism). Follow these instructions at home: Medicines Take over-the-counter and prescription medicines only as told by your health care provider. Talk with your health care provider before you take a cough suppressant medicine. Lifestyle  Avoid cigarette smoke. Do not use any products that contain nicotine or tobacco, such as cigarettes, e-cigarettes, and chewing tobacco. If you need help quitting, ask your health care provider. Drink enough fluid to keep your urine pale yellow. Avoid caffeine. Do not drink alcohol if your health care provider tells you not to drink. General instructions  Pay close attention to changes in your cough. Tell your health care provider  about them. Always cover your mouth when you cough. Avoid things that make you cough, such as perfume, candles, cleaning products, or campfire or tobacco smoke. If the air is dry, use a cool mist vaporizer or humidifier in your bedroom or your home to help loosen secretions. If your cough is worse at night, try to sleep in a semi-upright position. Rest as needed. Keep all follow-up visits as told by your health care provider. This is important. Contact a health care provider if you: Have new symptoms. Cough up pus. Have a cough that does not get better after 2-3 weeks or gets worse. Cannot control your cough with cough suppressant medicines and you are losing sleep. Have pain that gets worse or pain that is not helped with medicine. Have a fever. Have unexplained weight loss. Have night sweats. Get help right away if: You cough up blood. You have difficulty breathing. Your heartbeat is very fast. These symptoms may represent a serious problem that is an emergency. Do not wait to see if the symptoms will go away. Get medical help right away. Call your local emergency services (911 in the [place] Do not drive yourself to the hospital. Summary Coughing is a reflex that clears your throat and your airways. It is normal to cough occasionally, but a cough that happens with other symptoms or lasts a long time may be a sign of a condition that needs treatment. Take over-the-counter and prescription medicines only as told by your health care provider. Always cover your mouth when you cough. Contact a health care provider if you have new symptoms or a cough that does not get better after 2-3 weeks or gets worse. This information is  not intended to replace advice given to you by your health care provider. Make sure you discuss any questions you have with your health care provider. Document Revised: 08/18/2018 Document Reviewed: 08/18/2018 Elsevier Patient Education  [place].

## 2021-08-16 NOTE — Progress Notes (Signed)
Established Patient Office Visit  Subjective:  Patient ID: Bianca Murray, female    DOB: [birth year]  Age: 66 y.o. MRN: [id number]  CC:  Chief Complaint  Patient presents with   Cough   Virtual Visit via Telephone Note  I connected with Bianca Murray on 08/16/21 at  2:00 PM EST by telephone and verified that I am speaking with the correct person using two identifiers.  Location: Patient: Home  Provider: [facility]    I discussed the limitations, risks, security and privacy concerns of performing an evaluation and management service by telephone and the availability of in person appointments. I also discussed with the patient that there may be a patient responsible charge related to this service. The patient expressed understanding and agreed to proceed.  History of Present Illness: States that she continues to have fatigue, chest congestion, wheezing and shortness of breath.  Reports that she has been having a bothersome cough, states that she is unable to cough up any phlegm.  States the cough is keeping her awake at night, is eating and drinking okay but does endorse low appetite.  Reports that this has been continuing since she tested positive for COVID, she states this was prior to Thanksgiving.  States that she had a video visit on July 11, 2021.  She did state during that visit that she had a negative home COVID test and had been sick for approximately 4 days prior to that.  She was treated with cefdinir, states that she did finish that prescription and did start to feel better while taking the antibiotic.  States that she had another virtual visit on July 28, 2021, again stated during that visit that her home COVID test was negative.  She was prescribed doxycycline which she states that she did finish with some improvement of her symptoms, but states that after she finished the regimen her symptoms seem to return.  Reports that she was also prescribed  Tessalon Perles and cough syrup with moderate relief of her cough.  Reports that she is using 3 to 4 L of oxygen with any type of exertion, is using her daily inhaler regimen and states that she uses her albuterol rescue inhaler during exertion.  Reports that she is only using her nebulizer once  every 3 to 4 days.  Reports that she is unable to use any steroid, states that it "makes her feel funny".   Observations/Objective: Medical history and current medications reviewed, no physical exam completed  Past Medical History:  Diagnosis Date   Arthritis    Asthma    Cancer ([place])    Chronic respiratory failure with hypoxia and hypercapnia (HCC)    COPD (chronic obstructive pulmonary disease) (HCC)    Hypertension     Past Surgical History:  Procedure Laterality Date   ABDOMINAL HYSTERECTOMY     TRACHEOSTOMY     reversed   TRACHEOSTOMY CLOSURE      Family History  Problem Relation Age of Onset   Hypertension Mother    Heart disease Mother    Cancer Mother    Asthma Father    Asthma Sister    Hypertension Sister    Asthma Brother    Cancer Brother     Social History   Socioeconomic History   Marital status: Single    Spouse name: Not on file   Number of children: Not on file   Years of education: Not on file   Highest education level:  Not on file  Occupational History   Not on file  Tobacco Use   Smoking status: Former   Smokeless tobacco: Never   Tobacco comments:    quit smoking in 2018    Vaping Use   Vaping Use: Never used  Substance and Sexual Activity   Alcohol use: Not Currently   Drug use: Not Currently   Sexual activity: Not on file  Other Topics Concern   Not on file  Social History Narrative   Not on file   Social Determinants of Health   Financial Resource Strain: Not on file  Food Insecurity: Not on file  Transportation Needs: Not on file  Physical Activity: Not on file  Stress: Not on file  Social Connections: Not on file  Intimate  Partner Violence: Not on file    Outpatient Medications Prior to Visit  Medication Sig Dispense Refill   albuterol (PROVENTIL) (2.5 MG/3ML) 0.083% nebulizer solution Take 3 mLs (2.5 mg total) by nebulization 3 (three) times daily. 270 mL 1   albuterol (VENTOLIN HFA) 108 (90 Base) MCG/ACT inhaler Inhale 2 puffs into the lungs every 6 (six) hours as needed for wheezing or shortness of breath. 8 g 0   amLODipine (NORVASC) 5 MG tablet TAKE 1 TABLET(5 MG) BY MOUTH DAILY 90 tablet 1   atorvastatin (LIPITOR) 10 MG tablet Take 1 tablet (10 mg total) by mouth daily. 90 tablet 3   azelastine (ASTELIN) 0.1 % nasal spray Place 2 sprays into both nostrils 2 (two) times daily. Use in each nostril as directed 30 mL 12   Blood Pressure Monitoring (BLOOD PRESSURE MONITOR AUTOMAT) DEVI Measure blood pressure and pulse daily 1 Device 0   Budeson-Glycopyrrol-Formoterol (BREZTRI AEROSPHERE) 160-9-4.8 MCG/ACT AERO Inhale 2 puffs into the lungs in the morning and at bedtime. 10.7 g 6   cyclobenzaprine (FLEXERIL) 10 MG tablet Take 1 tablet (10 mg total) by mouth 3 (three) times daily as needed for muscle spasms. 60 tablet 1   fluticasone (FLONASE) 50 MCG/ACT nasal spray Place 2 sprays into both nostrils daily. 16 g 6   gabapentin (NEURONTIN) 100 MG capsule Take 1 capsule (100 mg total) by mouth 3 (three) times daily. 90 capsule 0   ibuprofen (IBU) 600 MG tablet TAKE 1 TABLET(600 MG) BY MOUTH EVERY 6 HOURS AS NEEDED 90 tablet 0   loratadine (ALLERGY RELIEF) 10 MG tablet TAKE ONE TABLET BY MOUTH ONCE DAILY (AM) 90 tablet 0   losartan (COZAAR) 100 MG tablet Take 1 tablet (100 mg total) by mouth daily. 90 tablet 2   Misc. Devices (PULSE OXIMETER FOR FINGER) MISC 1 application by Does not apply route daily as needed. 1 each 0   Misc. Devices MISC Nebulizer machine, Large shower chair.  Diagnosis- chronic respiratory failure 1 each 0   pantoprazole (PROTONIX) 40 MG tablet TAKE 1 TABLET BY MOUTH 2 (TWO) TIMES DAILY BEFORE A  MEAL (AM+EVENING) 60 tablet 2   polyethylene glycol powder (GLYCOLAX/MIRALAX) 17 GM/SCOOP powder Take 17 g by mouth daily as needed. 507 g 1   Spacer/Aero-Holding Chambers (AEROCHAMBER MV) inhaler Use as instructed 1 each 0   VENTOLIN HFA 108 (90 Base) MCG/ACT inhaler INHALE TWO PUFFS BY MOUTH EVERY SIX HOURS AS NEEDED FOR WHEEZING OR SHORTNESS OF BREATH 18 g 0   Vitamin D, Ergocalciferol, (DRISDOL) 1.25 MG (50000 UNIT) CAPS capsule Take 1 capsule (50,000 Units total) by mouth every 7 (seven) days. 12 capsule 1   benzonatate (TESSALON PERLES) 100 MG capsule Take 1  capsule (100 mg total) by mouth 3 (three) times daily as needed. 20 capsule 0   promethazine-dextromethorphan (PROMETHAZINE-DM) 6.25-15 MG/5ML syrup Take 2.5 mLs by mouth 4 (four) times daily as needed for cough. 118 mL 0   No facility-administered medications prior to visit.    No Known Allergies  ROS Review of Systems  Constitutional:  Positive for fatigue. Negative for chills and fever.  HENT:  Positive for congestion. Negative for ear discharge, ear pain, facial swelling, sinus pressure, sinus pain, sneezing, sore throat and trouble swallowing.   Eyes: Negative.   Respiratory:  Positive for cough, shortness of breath and wheezing.   Cardiovascular:  Negative for chest pain.  Gastrointestinal:  Negative for abdominal pain, nausea and vomiting.  Endocrine: Negative.   Genitourinary: Negative.   Musculoskeletal:  Negative for myalgias.  Skin: Negative.   Allergic/Immunologic: Negative.   Neurological: Negative.   Hematological: Negative.   Psychiatric/Behavioral: Negative.       Objective:      There were no vitals taken for this visit. Wt Readings from Last 3 Encounters:  06/28/21 217 lb (98.4 kg)  01/26/21 225 lb (102.1 kg)  12/21/20 224 lb (101.6 kg)     Health Maintenance Due  Topic Date Due   Zoster Vaccines- Shingrix (1 of 2) Never done   COVID-19 Vaccine (3 - Booster for Moderna series) 09/20/2020     There are no preventive care reminders to display for this patient.  No results found for: TSH Lab Results  Component Value Date   WBC 7.0 06/28/2021   HGB 11.2 06/28/2021   HCT 34.2 06/28/2021   MCV 86 06/28/2021   PLT 299 06/28/2021   Lab Results  Component Value Date   NA 145 (H) 06/28/2021   K 4.4 06/28/2021   CO2 29 06/28/2021   GLUCOSE 114 (H) 06/28/2021   BUN 11 06/28/2021   CREATININE 0.84 06/28/2021   BILITOT <0.2 06/28/2021   ALKPHOS 80 06/28/2021   AST 13 06/28/2021   ALT 9 06/28/2021   PROT 7.3 06/28/2021   ALBUMIN 4.7 06/28/2021   CALCIUM 9.8 06/28/2021   ANIONGAP 7 11/04/2018   EGFR 77 06/28/2021   Lab Results  Component Value Date   CHOL 343 (H) 06/28/2021   Lab Results  Component Value Date   HDL 135 06/28/2021   Lab Results  Component Value Date   LDLCALC 201 (H) 06/28/2021   Lab Results  Component Value Date   TRIG 61 06/28/2021   Lab Results  Component Value Date   CHOLHDL 2.5 06/28/2021   No results found for: HGBA1C    Assessment & Plan:   Problem List Items Addressed This Visit       Respiratory   COPD GOLD D (chronic obstructive pulmonary disease) (HCC)   Relevant Medications   benzonatate (TESSALON PERLES) 100 MG capsule   promethazine-dextromethorphan (PROMETHAZINE-DM) 6.25-15 MG/5ML syrup   Upper respiratory tract infection - Primary   Other Visit Diagnoses     Acute cough       Relevant Medications   benzonatate (TESSALON PERLES) 100 MG capsule       Meds ordered this encounter  Medications   benzonatate (TESSALON PERLES) 100 MG capsule    Sig: Take 1 capsule (100 mg total) by mouth 3 (three) times daily as needed.    Dispense:  20 capsule    Refill:  0    Order Specific Question:   Supervising Provider    Answer:   Asencion Noble  E [[id number]]   promethazine-dextromethorphan (PROMETHAZINE-DM) 6.25-15 MG/5ML syrup    Sig: Take 2.5 mLs by mouth 4 (four) times daily as needed for cough.    Dispense:  118 mL     Refill:  0    Order Specific Question:   Supervising Provider    Answer:   Elsie Stain [[id number]]    Assessment and Plan: 1. Acute cough Patient encouraged to use breathing treatment nebulizer 3 times a day until her symptoms improve, continue Tessalon Perles, Promethazine DM.  Red flags given for prompt reevaluation.  Patient to follow-up with mobile unit Tuesday, August 22, 2021 at 8:20 AM, will consider chest x-ray at that time if no further improvement.  Patient has failed 2 rounds of antibiotics.  Unclear of when positive COVID.  - benzonatate (TESSALON PERLES) 100 MG capsule; Take 1 capsule (100 mg total) by mouth 3 (three) times daily as needed.  Dispense: 20 capsule; Refill: 0  2. Upper respiratory tract infection, unspecified type   3. Mixed simple and mucopurulent chronic bronchitis (HCC)    Follow Up Instructions:    I discussed the assessment and treatment plan with the patient. The patient was provided an opportunity to ask questions and all were answered. The patient agreed with the plan and demonstrated an understanding of the instructions.   The patient was advised to call back or seek an in-person evaluation if the symptoms worsen or if the condition fails to improve as anticipated.  I provided 21 minutes of non-face-to-face time during this encounter.   Follow-up: Return in about 6 days (around 08/22/2021) for at 8:20 am Virtual.    [name], PA-C

## 2021-08-16 NOTE — Telephone Encounter (Signed)
Pt calling in experiencing cold sweats, lost appetite, stuffy ears. Requesting to have pcp send her in some medication and seeking medical advise.           Attempted to reach pt. "Call cannot be completed at this tie." Rechecked number, no alternate number. Will continue attempts to reach to discuss symptoms.

## 2021-08-16 NOTE — Telephone Encounter (Signed)
Pt is presently at [facility].

## 2021-08-21 ENCOUNTER — Encounter: Payer: Medicare Other | Admitting: Clinical

## 2021-08-22 ENCOUNTER — Ambulatory Visit: Payer: Medicare Other | Admitting: Physician Assistant

## 2021-08-22 ENCOUNTER — Other Ambulatory Visit: Payer: Self-pay

## 2021-08-22 ENCOUNTER — Telehealth: Payer: Self-pay | Admitting: *Deleted

## 2021-08-22 DIAGNOSIS — R051 Acute cough: Secondary | ICD-10-CM | POA: Diagnosis not present

## 2021-08-22 DIAGNOSIS — J069 Acute upper respiratory infection, unspecified: Secondary | ICD-10-CM

## 2021-08-22 NOTE — Progress Notes (Signed)
Established Patient Office Visit  Subjective:  Patient ID: Bianca Murray, female    DOB: [birth year]  Age: 66 y.o. MRN: [id number]  CC:  Chief Complaint  Patient presents with   Cough   Virtual Visit via Telephone Note  I connected with [name] on 08/22/21 at  8:30 AM EST by a  telemedicine application and verified that I am speaking with the correct person using two identifiers.  Location: Patient: Home  Provider: [facility]    I discussed the limitations of evaluation and management by telemedicine and the availability of in person appointments. The patient expressed understanding and agreed to proceed.  History of Present Illness:  Reports that she has been using her nebulizer breathing treatments 3 times a day as recommended during our last virtual visit on August 16, 2021.  Reports that her cough has mostly resolved, states her energy level has improved, appetite has returned to normal.  No new concerns at this time.   Observations/Objective: Medical history and current medications reviewed, no physical exam completed  Past Medical History:  Diagnosis Date   Arthritis    Asthma    Cancer ([place])    Chronic respiratory failure with hypoxia and hypercapnia (HCC)    COPD (chronic obstructive pulmonary disease) (HCC)    Hypertension     Past Surgical History:  Procedure Laterality Date   ABDOMINAL HYSTERECTOMY     TRACHEOSTOMY     reversed   TRACHEOSTOMY CLOSURE      Family History  Problem Relation Age of Onset   Hypertension Mother    Heart disease Mother    Cancer Mother    Asthma Father    Asthma Sister    Hypertension Sister    Asthma Brother    Cancer Brother     Social History   Socioeconomic History   Marital status: Single    Spouse name: Not on file   Number of children: Not on file   Years of education: Not on file   Highest education level: Not on file  Occupational History   Not on file  Tobacco Use    Smoking status: Former   Smokeless tobacco: Never   Tobacco comments:    quit smoking in 2018    Vaping Use   Vaping Use: Never used  Substance and Sexual Activity   Alcohol use: Not Currently   Drug use: Not Currently   Sexual activity: Not on file  Other Topics Concern   Not on file  Social History Narrative   Not on file   Social Determinants of Health   Financial Resource Strain: Not on file  Food Insecurity: Not on file  Transportation Needs: Not on file  Physical Activity: Not on file  Stress: Not on file  Social Connections: Not on file  Intimate Partner Violence: Not on file    Outpatient Medications Prior to Visit  Medication Sig Dispense Refill   albuterol (PROVENTIL) (2.5 MG/3ML) 0.083% nebulizer solution Take 3 mLs (2.5 mg total) by nebulization 3 (three) times daily. 270 mL 1   albuterol (VENTOLIN HFA) 108 (90 Base) MCG/ACT inhaler Inhale 2 puffs into the lungs every 6 (six) hours as needed for wheezing or shortness of breath. 8 g 0   amLODipine (NORVASC) 5 MG tablet TAKE 1 TABLET(5 MG) BY MOUTH DAILY 90 tablet 1   atorvastatin (LIPITOR) 10 MG tablet Take 1 tablet (10 mg total) by mouth daily. 90 tablet 3   azelastine (ASTELIN)  0.1 % nasal spray Place 2 sprays into both nostrils 2 (two) times daily. Use in each nostril as directed 30 mL 12   benzonatate (TESSALON PERLES) 100 MG capsule Take 1 capsule (100 mg total) by mouth 3 (three) times daily as needed. 20 capsule 0   Blood Pressure Monitoring (BLOOD PRESSURE MONITOR AUTOMAT) DEVI Measure blood pressure and pulse daily 1 Device 0   Budeson-Glycopyrrol-Formoterol (BREZTRI AEROSPHERE) 160-9-4.8 MCG/ACT AERO Inhale 2 puffs into the lungs in the morning and at bedtime. 10.7 g 6   cyclobenzaprine (FLEXERIL) 10 MG tablet Take 1 tablet (10 mg total) by mouth 3 (three) times daily as needed for muscle spasms. 60 tablet 1   fluticasone (FLONASE) 50 MCG/ACT nasal spray Place 2 sprays into both nostrils daily. 16 g 6    gabapentin (NEURONTIN) 100 MG capsule Take 1 capsule (100 mg total) by mouth 3 (three) times daily. 90 capsule 0   ibuprofen (IBU) 600 MG tablet TAKE 1 TABLET(600 MG) BY MOUTH EVERY 6 HOURS AS NEEDED 90 tablet 0   loratadine (ALLERGY RELIEF) 10 MG tablet TAKE ONE TABLET BY MOUTH ONCE DAILY (AM) 90 tablet 0   losartan (COZAAR) 100 MG tablet Take 1 tablet (100 mg total) by mouth daily. 90 tablet 2   Misc. Devices (PULSE OXIMETER FOR FINGER) MISC 1 application by Does not apply route daily as needed. 1 each 0   Misc. Devices MISC Nebulizer machine, Large shower chair.  Diagnosis- chronic respiratory failure 1 each 0   pantoprazole (PROTONIX) 40 MG tablet TAKE 1 TABLET BY MOUTH 2 (TWO) TIMES DAILY BEFORE A MEAL (AM+EVENING) 60 tablet 2   polyethylene glycol powder (GLYCOLAX/MIRALAX) 17 GM/SCOOP powder Take 17 g by mouth daily as needed. 507 g 1   promethazine-dextromethorphan (PROMETHAZINE-DM) 6.25-15 MG/5ML syrup Take 2.5 mLs by mouth 4 (four) times daily as needed for cough. 118 mL 0   Spacer/Aero-Holding Chambers (AEROCHAMBER MV) inhaler Use as instructed 1 each 0   VENTOLIN HFA 108 (90 Base) MCG/ACT inhaler INHALE TWO PUFFS BY MOUTH EVERY SIX HOURS AS NEEDED FOR WHEEZING OR SHORTNESS OF BREATH 18 g 0   Vitamin D, Ergocalciferol, (DRISDOL) 1.25 MG (50000 UNIT) CAPS capsule Take 1 capsule (50,000 Units total) by mouth every 7 (seven) days. 12 capsule 1   No facility-administered medications prior to visit.    No Known Allergies  ROS Review of Systems  Constitutional:  Negative for chills, fatigue and fever.  HENT:  Negative for congestion, sore throat and trouble swallowing.   Eyes: Negative.   Respiratory:  Negative for cough, shortness of breath and wheezing.   Cardiovascular:  Negative for chest pain.  Gastrointestinal: Negative.   Endocrine: Negative.   Genitourinary: Negative.   Musculoskeletal: Negative.   Skin: Negative.   Allergic/Immunologic: Negative.   Neurological: Negative.    Hematological: Negative.   Psychiatric/Behavioral: Negative.       Objective:      There were no vitals taken for this visit. Wt Readings from Last 3 Encounters:  06/28/21 217 lb (98.4 kg)  01/26/21 225 lb (102.1 kg)  12/21/20 224 lb (101.6 kg)     Health Maintenance Due  Topic Date Due   Zoster Vaccines- Shingrix (1 of 2) Never done   COVID-19 Vaccine (3 - Booster for Moderna series) 09/20/2020    There are no preventive care reminders to display for this patient.  No results found for: TSH Lab Results  Component Value Date   WBC 7.0 06/28/2021   HGB  11.2 06/28/2021   HCT 34.2 06/28/2021   MCV 86 06/28/2021   PLT 299 06/28/2021   Lab Results  Component Value Date   NA 145 (H) 06/28/2021   K 4.4 06/28/2021   CO2 29 06/28/2021   GLUCOSE 114 (H) 06/28/2021   BUN 11 06/28/2021   CREATININE 0.84 06/28/2021   BILITOT <0.2 06/28/2021   ALKPHOS 80 06/28/2021   AST 13 06/28/2021   ALT 9 06/28/2021   PROT 7.3 06/28/2021   ALBUMIN 4.7 06/28/2021   CALCIUM 9.8 06/28/2021   ANIONGAP 7 11/04/2018   EGFR 77 06/28/2021   Lab Results  Component Value Date   CHOL 343 (H) 06/28/2021   Lab Results  Component Value Date   HDL 135 06/28/2021   Lab Results  Component Value Date   LDLCALC 201 (H) 06/28/2021   Lab Results  Component Value Date   TRIG 61 06/28/2021   Lab Results  Component Value Date   CHOLHDL 2.5 06/28/2021   No results found for: HGBA1C    Assessment & Plan:   Problem List Items Addressed This Visit       Respiratory   Upper respiratory tract infection   Other Visit Diagnoses     Acute cough    -  Primary       No orders of the defined types were placed in this encounter.   Assessment and Plan: 1. Acute cough Mostly resolved.  Patient encouraged to continue using breathing treatments, tapering use as needed to eventually go back to only as needed.  Patient education given on continued supportive care, red flags for prompt  reevaluation  2. Upper respiratory tract infection, unspecified type Mostly resolved, no further medical intervention required at this time   Follow Up Instructions:    I discussed the assessment and treatment plan with the patient. The patient was provided an opportunity to ask questions and all were answered. The patient agreed with the plan and demonstrated an understanding of the instructions.   The patient was advised to call back or seek an in-person evaluation if the symptoms worsen or if the condition fails to improve as anticipated.  I provided 9 minutes of non-face-to-face time during this encounter.   Follow-up: Return if symptoms worsen or fail to improve.    Loraine Grip Mayers, PA-C

## 2021-08-22 NOTE — Patient Instructions (Signed)
Continue using the nebulizer breathing treatments 3 times a day over the next few days, then you can work on gradually reducing the amount of time needed as appropriate.  Make sure that you continue to get plenty of hydration and lots of rest.  I am thankful you are feeling better, please let us know if there is anything else we can do for you  [name], PA-C Physician Assistant [facility] [email or web address]   Cough, Adult Coughing is a reflex that clears your throat and your airways (respiratory system). Coughing helps to heal and protect your lungs. It is normal to cough occasionally, but a cough that happens with other symptoms or lasts a long time may be a sign of a condition that needs treatment. An acute cough may only last 2-3 weeks, while a chronic cough may last 8 or more weeks. Coughing is commonly caused by: Infection of the respiratory systemby viruses or bacteria. Breathing in substances that irritate your lungs. Allergies. Asthma. Mucus that runs down the back of your throat (postnasal drip). Smoking. Acid backing up from the stomach into the esophagus (gastroesophageal reflux). Certain medicines. Chronic lung problems. Other medical conditions such as heart failure or a blood clot in the lung (pulmonary embolism). Follow these instructions at home: Medicines Take over-the-counter and prescription medicines only as told by your health care provider. Talk with your health care provider before you take a cough suppressant medicine. Lifestyle  Avoid cigarette smoke. Do not use any products that contain nicotine or tobacco, such as cigarettes, e-cigarettes, and chewing tobacco. If you need help quitting, ask your health care provider. Drink enough fluid to keep your urine pale yellow. Avoid caffeine. Do not drink alcohol if your health care provider tells you not to drink. General instructions  Pay close  attention to changes in your cough. Tell your health care provider about them. Always cover your mouth when you cough. Avoid things that make you cough, such as perfume, candles, cleaning products, or campfire or tobacco smoke. If the air is dry, use a cool mist vaporizer or humidifier in your bedroom or your home to help loosen secretions. If your cough is worse at night, try to sleep in a semi-upright position. Rest as needed. Keep all follow-up visits as told by your health care provider. This is important. Contact a health care provider if you: Have new symptoms. Cough up pus. Have a cough that does not get better after 2-3 weeks or gets worse. Cannot control your cough with cough suppressant medicines and you are losing sleep. Have pain that gets worse or pain that is not helped with medicine. Have a fever. Have unexplained weight loss. Have night sweats. Get help right away if: You cough up blood. You have difficulty breathing. Your heartbeat is very fast. These symptoms may represent a serious problem that is an emergency. Do not wait to see if the symptoms will go away. Get medical help right away. Call your local emergency services (911 in the [place] Do not drive yourself to the hospital. Summary Coughing is a reflex that clears your throat and your airways. It is normal to cough occasionally, but a cough that happens with other symptoms or lasts a long time may be a sign of a condition that needs treatment. Take over-the-counter and prescription medicines only as told by your health care provider. Always cover your mouth when you cough. Contact a health care provider if you have new symptoms or a cough that  does not get better after 2-3 weeks or gets worse. This information is not intended to replace advice given to you by your health care provider. Make sure you discuss any questions you have with your health care provider. Document Revised: 08/18/2018 Document Reviewed:  08/18/2018 Elsevier Patient Education  [place].

## 2021-08-23 NOTE — Telephone Encounter (Signed)
I have not seen this today  Pls have it refaxed

## 2021-08-23 NOTE — Telephone Encounter (Signed)
Gay Filler called from [facility] to notify office that a plan of care has been faxed and needs correspondence soon.

## 2021-08-24 ENCOUNTER — Other Ambulatory Visit: Payer: Self-pay | Admitting: Critical Care Medicine

## 2021-08-24 ENCOUNTER — Telehealth: Payer: Self-pay

## 2021-08-24 ENCOUNTER — Telehealth (HOSPITAL_COMMUNITY): Payer: Self-pay

## 2021-08-24 ENCOUNTER — Other Ambulatory Visit (HOSPITAL_COMMUNITY): Payer: Self-pay

## 2021-08-24 NOTE — Telephone Encounter (Signed)
Refill sent  TY heather

## 2021-08-24 NOTE — Progress Notes (Signed)
Paramedicine Encounter    Patient ID: Bianca Murray, female    DOB: 12/30/1955, 66 y.o.   MRN: [id number]  Arrived for paramedicine home visit for Bianca Murray who reports feeling better this week stating she took the prescribed cough medication and is improving.   Assessment, vitals obtained and as noted. No swelling noted. Lung sounds notes some slight wheezing, Bianca Murray encouraged to use her inhaler. She agreed.   We reviewed medications. Bubble packs needing to be delivered today, I texted pharmacist at [facility] and he will complete them once Gabapentin is refilled.   We discussed graduation from [place] today as Bianca Murray has successfully met all goals to be discharged. She has access to her medications (bubble packs at [facility]), access to providers and set up with MyChart, has access to transportation with her daughter and has housing, insurance and access to food.   She has a home aide who comes out daily, she has a home oxygen concentrator, a rollator/walker and cane. She also is waiting delivery of power chair.   Bianca Murray has been a long term paramedicine patient and understands the need for graduation. I will forward this to her PCP and Care Manager at [facility].   Home visit complete.  Bianca Murray understands to reach out to [facility] if she requires any further assistance.    Patient Care Team: Elsie Stain, MD as PCP - General (Pulmonary Disease)  Patient Active Problem List   Diagnosis Date Noted   Upper respiratory tract infection 07/11/2021   Mixed hyperlipidemia 06/29/2021   Impaired mobility and personal care 06/28/2021   Chronic anxiety 09/12/2020   Generalized anxiety disorder 09/12/2020   GERD (gastroesophageal reflux disease) 03/22/2020   Abdominal bloating 12/10/2019   Hypertension 12/03/2018   Centrilobular emphysema ([place]) 12/03/2018   Vitamin D deficiency 11/27/2018   Chronic respiratory failure with hypoxia and hypercapnia (HCC)    COPD GOLD D (chronic obstructive  pulmonary disease) ([place]) 10/28/2018    Current Outpatient Medications:    albuterol (PROVENTIL) (2.5 MG/3ML) 0.083% nebulizer solution, Take 3 mLs (2.5 mg total) by nebulization 3 (three) times daily., Disp: 270 mL, Rfl: 1   albuterol (VENTOLIN HFA) 108 (90 Base) MCG/ACT inhaler, Inhale 2 puffs into the lungs every 6 (six) hours as needed for wheezing or shortness of breath., Disp: 8 g, Rfl: 0   amLODipine (NORVASC) 5 MG tablet, TAKE 1 TABLET(5 MG) BY MOUTH DAILY, Disp: 90 tablet, Rfl: 1   atorvastatin (LIPITOR) 10 MG tablet, Take 1 tablet (10 mg total) by mouth daily., Disp: 90 tablet, Rfl: 3   azelastine (ASTELIN) 0.1 % nasal spray, Place 2 sprays into both nostrils 2 (two) times daily. Use in each nostril as directed, Disp: 30 mL, Rfl: 12   benzonatate (TESSALON PERLES) 100 MG capsule, Take 1 capsule (100 mg total) by mouth 3 (three) times daily as needed., Disp: 20 capsule, Rfl: 0   Blood Pressure Monitoring (BLOOD PRESSURE MONITOR AUTOMAT) DEVI, Measure blood pressure and pulse daily, Disp: 1 Device, Rfl: 0   Budeson-Glycopyrrol-Formoterol (BREZTRI AEROSPHERE) 160-9-4.8 MCG/ACT AERO, Inhale 2 puffs into the lungs in the morning and at bedtime., Disp: 10.7 g, Rfl: 6   cyclobenzaprine (FLEXERIL) 10 MG tablet, Take 1 tablet (10 mg total) by mouth 3 (three) times daily as needed for muscle spasms., Disp: 60 tablet, Rfl: 1   fluticasone (FLONASE) 50 MCG/ACT nasal spray, Place 2 sprays into both nostrils daily., Disp: 16 g, Rfl: 6   gabapentin (NEURONTIN) 100 MG capsule, Take 1 capsule (100 mg  total) by mouth 3 (three) times daily., Disp: 90 capsule, Rfl: 0   ibuprofen (IBU) 600 MG tablet, TAKE 1 TABLET(600 MG) BY MOUTH EVERY 6 HOURS AS NEEDED, Disp: 90 tablet, Rfl: 0   loratadine (ALLERGY RELIEF) 10 MG tablet, TAKE ONE TABLET BY MOUTH ONCE DAILY (AM), Disp: 90 tablet, Rfl: 0   losartan (COZAAR) 100 MG tablet, Take 1 tablet (100 mg total) by mouth daily., Disp: 90 tablet, Rfl: 2   Misc. Devices  (PULSE OXIMETER FOR FINGER) MISC, 1 application by Does not apply route daily as needed., Disp: 1 each, Rfl: 0   Misc. Devices MISC, Nebulizer machine, Large shower chair.  Diagnosis- chronic respiratory failure, Disp: 1 each, Rfl: 0   pantoprazole (PROTONIX) 40 MG tablet, TAKE 1 TABLET BY MOUTH 2 (TWO) TIMES DAILY BEFORE A MEAL (AM+EVENING), Disp: 60 tablet, Rfl: 2   polyethylene glycol powder (GLYCOLAX/MIRALAX) 17 GM/SCOOP powder, Take 17 g by mouth daily as needed., Disp: 507 g, Rfl: 1   promethazine-dextromethorphan (PROMETHAZINE-DM) 6.25-15 MG/5ML syrup, Take 2.5 mLs by mouth 4 (four) times daily as needed for cough., Disp: 118 mL, Rfl: 0   Spacer/Aero-Holding Chambers (AEROCHAMBER MV) inhaler, Use as instructed, Disp: 1 each, Rfl: 0   VENTOLIN HFA 108 (90 Base) MCG/ACT inhaler, INHALE TWO PUFFS BY MOUTH EVERY SIX HOURS AS NEEDED FOR WHEEZING OR SHORTNESS OF BREATH, Disp: 18 g, Rfl: 0   Vitamin D, Ergocalciferol, (DRISDOL) 1.25 MG (50000 UNIT) CAPS capsule, Take 1 capsule (50,000 Units total) by mouth every 7 (seven) days., Disp: 12 capsule, Rfl: 1 No Known Allergies   Social History   Socioeconomic History   Marital status: Single    Spouse name: Not on file   Number of children: Not on file   Years of education: Not on file   Highest education level: Not on file  Occupational History   Not on file  Tobacco Use   Smoking status: Former   Smokeless tobacco: Never   Tobacco comments:    quit smoking in 2018    Vaping Use   Vaping Use: Never used  Substance and Sexual Activity   Alcohol use: Not Currently   Drug use: Not Currently   Sexual activity: Not on file  Other Topics Concern   Not on file  Social History Narrative   Not on file   Social Determinants of Health   Financial Resource Strain: Not on file  Food Insecurity: Not on file  Transportation Needs: Not on file  Physical Activity: Not on file  Stress: Not on file  Social Connections: Not on file  Intimate  Partner Violence: Not on file    Physical Exam Vitals reviewed.  Constitutional:      Appearance: Normal appearance. She is normal weight.  HENT:     Head: Normocephalic.     Nose: Nose normal. No congestion or rhinorrhea.     Mouth/Throat:     Mouth: Mucous membranes are moist.     Pharynx: Oropharynx is clear.  Eyes:     Conjunctiva/sclera: Conjunctivae normal.     Pupils: Pupils are equal, round, and reactive to light.  Cardiovascular:     Rate and Rhythm: Normal rate and regular rhythm.     Pulses: Normal pulses.     Heart sounds: Normal heart sounds.  Pulmonary:     Effort: Pulmonary effort is normal. No respiratory distress.     Breath sounds: Wheezing present.  Abdominal:     General: Abdomen is flat.  Palpations: Abdomen is soft.  Musculoskeletal:        General: No swelling. Normal range of motion.     Cervical back: Normal range of motion.     Right lower leg: No edema.     Left lower leg: No edema.  Skin:    General: Skin is warm and dry.     Capillary Refill: Capillary refill takes less than 2 seconds.  Neurological:     General: No focal deficit present.     Mental Status: She is alert. Mental status is at baseline.  Psychiatric:        Mood and Affect: Mood normal.        Future Appointments  Date Time Provider [place]  09/28/2021 10:30 AM Elsie Stain, MD [facility] None     ACTION: Home visit completed

## 2021-08-24 NOTE — Telephone Encounter (Signed)
noted 

## 2021-08-24 NOTE — Telephone Encounter (Signed)
No call back number, I talked with Oley Balm  to see if he remember but he didn't. I'll try  [facility] to see if they can fax it again

## 2021-08-24 NOTE — Telephone Encounter (Signed)
Thank you [name] so much for all you have done for Bianca Murray

## 2021-08-24 NOTE — Telephone Encounter (Signed)
Call placed to patient to address questions regarding her insurance. She said she could not remember a question about insurance but said she needs a refill of her ibuprofen and a new prescription for clonazepam for her anxiety.

## 2021-08-24 NOTE — Telephone Encounter (Signed)
[facility] contacted me reporting Bianca Murray is in need of refills of Gabapentin sent to their pharmacy so he can complete her bubble packs today.   Routed to Dr. Joya Gaskins- Please send Gabapentin to [place]. Thank you!

## 2021-08-24 NOTE — Telephone Encounter (Signed)
I just found it and signed

## 2021-08-24 NOTE — Telephone Encounter (Signed)
Paramedicine Program Discharge note:  Final home visit completed today for [name]. We discussed graduation from [place] today as Bianca Murray has successfully met all goals to be discharged. She has access to her medications (bubble packs at [facility]), access to providers and set up with MyChart, has access to transportation with her daughter and has housing, insurance and access to food.   She has a home aide who comes out daily, she has a home oxygen concentrator, a rollator/walker and cane. She also is waiting delivery of power chair.   Bianca Murray has been a long term paramedicine patient and understands the need for graduation. I will forward this to her PCP and Care Manager at [facility].   Home visit complete.  Bianca Murray understands to reach out to [facility] if she requires any further assistance.

## 2021-08-25 MED ORDER — IBUPROFEN 600 MG PO TABS: ORAL_TABLET | ORAL | 2 refills | Status: DC

## 2021-08-25 NOTE — Telephone Encounter (Signed)
Pt with hx of benzodiazapene over use prefer to hold off on clonopin   Ibuprofen rx sent

## 2021-08-28 ENCOUNTER — Telehealth: Payer: Self-pay | Admitting: Critical Care Medicine

## 2021-08-28 NOTE — Telephone Encounter (Signed)
This has been addressed with the patient in another call today

## 2021-08-28 NOTE — Telephone Encounter (Signed)
FYI

## 2021-08-28 NOTE — Telephone Encounter (Signed)
Call placed to patient and informed her that Dr Joya Gaskins sent to rx for ibuprofen to her pharmacy and he is going to hold off on the klonopin. She said that was fine, she actually found some klonopin that she did not know she had.  Regarding her power chair  - explained to her that it was ordered 07/20/2021 by [facility] and they do not have a time line for delivery yet.  She then said that her cholesterol medication is not in the bubble packs.  She has a separate bottle of atorvastatin and is not sure what to do when the bottle runs out. Call placed to [place], spoke to [place], [facility]. He said he would send someone from the pharmacy to her house tomorrow to pick up the bubble packs and he will add the atorvastatin to the packs she has.  Call placed to patient and informed her of the plan for [facility] to update her bubble packs.

## 2021-08-28 NOTE — Telephone Encounter (Signed)
Copied from [place] [id number]. Topic: General - Inquiry >> Aug 24, 2021  4:19 PM Yvette Rack wrote: Reason for CRM: Pt asked that her call be returned with an update on the order for wheelchair along with the contact information for the company. Cb# [phone]

## 2021-08-29 ENCOUNTER — Ambulatory Visit: Payer: Self-pay | Admitting: *Deleted

## 2021-08-29 NOTE — Telephone Encounter (Signed)
Patient is requesting help with her diet- she states she is having trouble eating- having trouble eating and is unable to eat food provided by family. Requesting nutrition help.Patient states she is unable to fix food for herself because she has COPD and gets out of breath trying to fix meals. Patient does not drink enough water- she is aware. Patient wants to see if she can get meals on wheels- patient states she only eats once/twice a day Answer Assessment - Initial Assessment Questions 1. CONCERN: "What happened that made you call today?"     Not eating or drinking enough 2. ONSET: "When did the decreased eating/drinking begin?" (e.g., minutes, hours or days ago)     For months 3. UNDERWEIGHT: "Are you losing weight?" "Is the weight loss intentional?"     Patient states her weight fluctuates- she does feel she is losing now 4. OVERWEIGHT: "Do you feel you are gaining too much weight?"     Not eating enough to gain weight 5. FOOD INTAKE: "Do you worry about what you eat? How much you eat?"     Patient states she is unable to eat food provided by family- mostly fried- unable to tolerate 6. EXERCISE:  "Do you exercise?" If Yes, ask: "What type of exercise do you do?"     Patient states she does get up and walk around- she gets very winded 7. CURRENT WEIGHT: "How much do you weigh?"       Over 200lb 8. CURRENT HEIGHT: "How tall are you?"     5'3" 9. BMI: "What is your BMI (body mass index)?" Note: The triager can use the BMI calculator on the CDC website at http://osborn.com/.    - Underweight: Below 18.5   - Normal, healthy weight: 18.5 - 24.9   - Overweight: 25.0 - 29.9   - Obese: 30 or above     *No Answer* 10. MEDICINES: "Do you routinely use medicines (laxatives, diuretics, enemas) to help control your weight?"       uses laxative to prevent constipation 11. OTHER SYMPTOMS: "What symptoms are you currently experiencing?" (e.g., none; abnormal  heart rate, blackout spells, shakiness, suicidal thoughts, vomiting)       Sometimes dizziness with standing too fast 12. PREGNANCY: "Is there any chance you are pregnant?" "When was your last menstrual period?"       *No Answer*  Protocols used: Eating Disorders Symptoms and Questions-A-AH

## 2021-08-29 NOTE — Telephone Encounter (Signed)
°  Chief Complaint: Patient is requesting help with her diet- wants nutritionalist, meals on wheels Symptoms: not eating or drinking enough, gets very out of breath preparing her meals Frequency: eats 1-2 times/day Pertinent Negatives: Patient denies   Disposition: [] ED /[] Urgent Care (no appt availability in office) / [] Appointment(In office/virtual)/ []  [place] [facility]/ [] Home Care/ [] Refused Recommended Disposition /[] [facility]/ []  Follow-up with PCP Additional Notes: Patient requesting help with her diet- information collected for provider review

## 2021-08-30 NOTE — Telephone Encounter (Signed)
FYI

## 2021-09-20 ENCOUNTER — Other Ambulatory Visit: Payer: Self-pay | Admitting: Critical Care Medicine

## 2021-09-20 NOTE — Telephone Encounter (Signed)
Just filled Claritin today for #90. Requested Prescriptions  Pending Prescriptions Disp Refills   gabapentin (NEURONTIN) 100 MG capsule [Pharmacy Med Name: GABAPENTIN 100 MG ORAL CAPSULE] 90 capsule 0    Sig: TAKE 1 CAPSULE (100 MG TOTAL) BY MOUTH 3 (THREE) TIMES DAILY. (AM+NOON+BEDTIME)     Neurology: Anticonvulsants - gabapentin Passed - 09/20/2021  2:57 PM      Passed - Cr in normal range and within 360 days    Creatinine, Ser  Date Value Ref Range Status  06/28/2021 0.84 0.57 - 1.00 mg/dL Final         Passed - Completed PHQ-2 or PHQ-9 in the last 360 days      Passed - Valid encounter within last 12 months    Recent Outpatient Visits          2 months ago Impaired mobility and personal care   [place] Elsie Stain, MD   4 months ago Mixed simple and mucopurulent chronic bronchitis ([place])   [place] Elsie Stain, MD   6 months ago Need for vaccination for Strep pneumoniae   [place] Elsie Stain, MD   8 months ago Mixed simple and mucopurulent chronic bronchitis [facility])   [place] Elsie Stain, MD   10 months ago Centrilobular emphysema [facility])   [place] Elsie Stain, MD      Future Appointments            In 1 week Elsie Stain, MD [place]            loratadine (CLARITIN) 10 MG tablet [Pharmacy Med Name: LORATADINE 10 MG ORAL TABLET] 90 tablet 0    Sig: TAKE ONE TABLET BY MOUTH ONCE DAILY (AM)     Ear, Nose, and Throat:  Antihistamines 2 Passed - 09/20/2021  2:57 PM      Passed - Cr in normal range and within 360 days    Creatinine, Ser  Date Value Ref Range Status  06/28/2021 0.84 0.57 - 1.00 mg/dL Final         Passed - Valid encounter within last 12 months    Recent Outpatient Visits          2 months ago Impaired mobility and personal  care   [place] Elsie Stain, MD   4 months ago Mixed simple and mucopurulent chronic bronchitis ([place])   [place] Elsie Stain, MD   6 months ago Need for vaccination for Strep pneumoniae   [place] Elsie Stain, MD   8 months ago Mixed simple and mucopurulent chronic bronchitis [facility])   [place] Elsie Stain, MD   10 months ago Centrilobular emphysema [facility])   [place], MD      Future Appointments            In 1 week Joya Gaskins Burnett Harry, MD [facility]

## 2021-09-28 ENCOUNTER — Telehealth (HOSPITAL_BASED_OUTPATIENT_CLINIC_OR_DEPARTMENT_OTHER): Payer: Medicare Other | Admitting: Critical Care Medicine

## 2021-09-28 ENCOUNTER — Encounter: Payer: Self-pay | Admitting: Critical Care Medicine

## 2021-09-28 ENCOUNTER — Telehealth: Payer: Self-pay | Admitting: Critical Care Medicine

## 2021-09-28 VITALS — BP 110/64

## 2021-09-28 DIAGNOSIS — J9611 Chronic respiratory failure with hypoxia: Secondary | ICD-10-CM

## 2021-09-28 DIAGNOSIS — Z7409 Other reduced mobility: Secondary | ICD-10-CM

## 2021-09-28 DIAGNOSIS — I10 Essential (primary) hypertension: Secondary | ICD-10-CM

## 2021-09-28 DIAGNOSIS — J418 Mixed simple and mucopurulent chronic bronchitis: Secondary | ICD-10-CM | POA: Diagnosis not present

## 2021-09-28 DIAGNOSIS — R051 Acute cough: Secondary | ICD-10-CM

## 2021-09-28 DIAGNOSIS — J9612 Chronic respiratory failure with hypercapnia: Secondary | ICD-10-CM

## 2021-09-28 DIAGNOSIS — J432 Centrilobular emphysema: Secondary | ICD-10-CM

## 2021-09-28 DIAGNOSIS — E782 Mixed hyperlipidemia: Secondary | ICD-10-CM

## 2021-09-28 DIAGNOSIS — Z741 Need for assistance with personal care: Secondary | ICD-10-CM

## 2021-09-28 DIAGNOSIS — U071 COVID-19: Secondary | ICD-10-CM

## 2021-09-28 MED ORDER — AMLODIPINE BESYLATE 5 MG PO TABS: ORAL_TABLET | ORAL | 1 refills | Status: DC

## 2021-09-28 MED ORDER — IBUPROFEN 600 MG PO TABS: ORAL_TABLET | ORAL | 2 refills | Status: DC

## 2021-09-28 MED ORDER — BREZTRI AEROSPHERE 160-9-4.8 MCG/ACT IN AERO: 2.0000 | INHALATION_SPRAY | Freq: Two times a day (BID) | RESPIRATORY_TRACT | 6 refills | Status: DC

## 2021-09-28 MED ORDER — ALBUTEROL SULFATE HFA 108 (90 BASE) MCG/ACT IN AERS: 2.0000 | INHALATION_SPRAY | Freq: Four times a day (QID) | RESPIRATORY_TRACT | 0 refills | Status: DC | PRN

## 2021-09-28 MED ORDER — AZELASTINE HCL 0.1 % NA SOLN: 2.0000 | Freq: Two times a day (BID) | NASAL | 12 refills | Status: DC

## 2021-09-28 MED ORDER — CYCLOBENZAPRINE HCL 10 MG PO TABS: 10.0000 mg | ORAL_TABLET | Freq: Three times a day (TID) | ORAL | 1 refills | Status: DC | PRN

## 2021-09-28 MED ORDER — FLUTICASONE PROPIONATE 50 MCG/ACT NA SUSP: 2.0000 | Freq: Every day | NASAL | 6 refills | Status: DC

## 2021-09-28 MED ORDER — ALBUTEROL SULFATE (2.5 MG/3ML) 0.083% IN NEBU: 2.5000 mg | INHALATION_SOLUTION | Freq: Three times a day (TID) | RESPIRATORY_TRACT | 1 refills | Status: DC

## 2021-09-28 MED ORDER — LORATADINE 10 MG PO TABS: ORAL_TABLET | ORAL | 0 refills | Status: DC

## 2021-09-28 MED ORDER — GABAPENTIN 100 MG PO CAPS: ORAL_CAPSULE | ORAL | 3 refills | Status: DC

## 2021-09-28 MED ORDER — VITAMIN D (ERGOCALCIFEROL) 1.25 MG (50000 UNIT) PO CAPS: 50000.0000 [IU] | ORAL_CAPSULE | ORAL | 1 refills | Status: DC

## 2021-09-28 MED ORDER — ATORVASTATIN CALCIUM 10 MG PO TABS: 10.0000 mg | ORAL_TABLET | Freq: Every day | ORAL | 3 refills | Status: DC

## 2021-09-28 MED ORDER — PANTOPRAZOLE SODIUM 40 MG PO TBEC: DELAYED_RELEASE_TABLET | ORAL | 2 refills | Status: DC

## 2021-09-28 MED ORDER — LOSARTAN POTASSIUM 100 MG PO TABS: 100.0000 mg | ORAL_TABLET | Freq: Every day | ORAL | 2 refills | Status: DC

## 2021-09-28 NOTE — Telephone Encounter (Signed)
This type of dme does not exist

## 2021-09-28 NOTE — Assessment & Plan Note (Signed)
Patient improved with motorized wheelchair

## 2021-09-28 NOTE — Assessment & Plan Note (Signed)
Chronic respiratory failure continue current oxygen treatment 4 L

## 2021-09-28 NOTE — Telephone Encounter (Signed)
Fyi.

## 2021-09-28 NOTE — Progress Notes (Signed)
Established Patient Office Visit  Subjective:  Patient ID: Bianca Murray, female    DOB: May 28, 1956  Age: 66 y.o. MRN: [id number] Virtual Visit via Video Note  I connected with Bianca Murray on 09/28/21 at 1030am by a video enabled telemedicine application and verified that I am speaking with the correct person using two identifiers.   Consent:  I discussed the limitations, risks, security and privacy concerns of performing an evaluation and management service by video visit and the availability of in person appointments. I also discussed with the patient that there may be a patient responsible charge related to this service. The patient expressed understanding and agreed to proceed.  Location of patient: Patient's at home  Location of provider: I am in my office  Persons participating in the televisit with the patient.    No one else on the call   History of Present Illness:    CC: Primary care follow-up  HPI [name] presents for primary care follow-up on video.  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  She does tell me she now has her motorized wheelchair and this is doing well inside her home.  Her breathing is at baseline.  She has gotten over the COVID illness she had in the past year.  She needs refills on multiple medications.  There are no primary care gaps active at this time.  She might benefit from a shingles vaccine.  Patient still being visited by the [facility].    Past Medical History:  Diagnosis Date   Arthritis    Asthma    Cancer ([place])    Chronic respiratory failure with hypoxia and hypercapnia (HCC)    COPD (chronic obstructive pulmonary disease) (HCC)    Hypertension     Past Surgical History:  Procedure Laterality Date   ABDOMINAL HYSTERECTOMY     TRACHEOSTOMY     reversed   TRACHEOSTOMY CLOSURE      Family History  Problem Relation Age of Onset    Hypertension Mother    Heart disease Mother    Cancer Mother    Asthma Father    Asthma Sister    Hypertension Sister    Asthma Brother    Cancer Brother     Social History   Socioeconomic History   Marital status: Single    Spouse name: Not on file   Number of children: Not on file   Years of education: Not on file   Highest education level: Not on file  Occupational History   Not on file  Tobacco Use   Smoking status: Former   Smokeless tobacco: Never   Tobacco comments:    quit smoking in 2018    Vaping Use   Vaping Use: Never used  Substance and Sexual Activity   Alcohol use: Not Currently   Drug use: Not Currently   Sexual activity: Not on file  Other Topics Concern   Not on file  Social History Narrative   Not on file   Social Determinants of Health   Financial Resource Strain: Not on file  Food Insecurity: Not on file  Transportation Needs: Not on file  Physical Activity: Not on file  Stress: Not on file  Social Connections: Not on file  Intimate Partner Violence: Not on file    Outpatient Medications Prior to Visit  Medication Sig Dispense Refill   albuterol (VENTOLIN HFA)  108 (90 Base) MCG/ACT inhaler Inhale 2 puffs into the lungs every 6 (six) hours as needed for wheezing or shortness of breath. 8 g 0   amLODipine (NORVASC) 5 MG tablet TAKE 1 TABLET(5 MG) BY MOUTH DAILY 90 tablet 1   atorvastatin (LIPITOR) 10 MG tablet Take 1 tablet (10 mg total) by mouth daily. 90 tablet 3   azelastine (ASTELIN) 0.1 % nasal spray Place 2 sprays into both nostrils 2 (two) times daily. Use in each nostril as directed 30 mL 12   benzonatate (TESSALON PERLES) 100 MG capsule Take 1 capsule (100 mg total) by mouth 3 (three) times daily as needed. 20 capsule 0   Blood Pressure Monitoring (BLOOD PRESSURE MONITOR AUTOMAT) DEVI Measure blood pressure and pulse daily 1 Device 0   Budeson-Glycopyrrol-Formoterol (BREZTRI AEROSPHERE) 160-9-4.8 MCG/ACT AERO Inhale 2 puffs into the  lungs in the morning and at bedtime. 10.7 g 6   cyclobenzaprine (FLEXERIL) 10 MG tablet Take 1 tablet (10 mg total) by mouth 3 (three) times daily as needed for muscle spasms. 60 tablet 1   fluticasone (FLONASE) 50 MCG/ACT nasal spray Place 2 sprays into both nostrils daily. 16 g 6   gabapentin (NEURONTIN) 100 MG capsule TAKE 1 CAPSULE (100 MG TOTAL) BY MOUTH 3 (THREE) TIMES DAILY. (AM+NOON+BEDTIME) 90 capsule 0   ibuprofen (IBU) 600 MG tablet TAKE 1 TABLET(600 MG) BY MOUTH EVERY 6 HOURS AS NEEDED 90 tablet 2   loratadine (ALLERGY RELIEF) 10 MG tablet TAKE ONE TABLET BY MOUTH ONCE DAILY (AM) 90 tablet 0   losartan (COZAAR) 100 MG tablet Take 1 tablet (100 mg total) by mouth daily. 90 tablet 2   Misc. Devices (PULSE OXIMETER FOR FINGER) MISC 1 application by Does not apply route daily as needed. 1 each 0   Misc. Devices MISC Nebulizer machine, Large shower chair.  Diagnosis- chronic respiratory failure 1 each 0   pantoprazole (PROTONIX) 40 MG tablet TAKE 1 TABLET BY MOUTH 2 (TWO) TIMES DAILY BEFORE A MEAL (AM+EVENING) 60 tablet 2   polyethylene glycol powder (GLYCOLAX/MIRALAX) 17 GM/SCOOP powder Take 17 g by mouth daily as needed. 507 g 1   promethazine-dextromethorphan (PROMETHAZINE-DM) 6.25-15 MG/5ML syrup Take 2.5 mLs by mouth 4 (four) times daily as needed for cough. 118 mL 0   Spacer/Aero-Holding Chambers (AEROCHAMBER MV) inhaler Use as instructed 1 each 0   VENTOLIN HFA 108 (90 Base) MCG/ACT inhaler INHALE TWO PUFFS BY MOUTH EVERY SIX HOURS AS NEEDED FOR WHEEZING OR SHORTNESS OF BREATH 18 g 0   Vitamin D, Ergocalciferol, (DRISDOL) 1.25 MG (50000 UNIT) CAPS capsule Take 1 capsule (50,000 Units total) by mouth every 7 (seven) days. 12 capsule 1   albuterol (PROVENTIL) (2.5 MG/3ML) 0.083% nebulizer solution Take 3 mLs (2.5 mg total) by nebulization 3 (three) times daily. 270 mL 1   No facility-administered medications prior to visit.    No Known Allergies  ROS Review of Systems   Constitutional: Negative.   HENT: Negative.  Negative for ear pain, postnasal drip, rhinorrhea, sinus pressure, sore throat, trouble swallowing and voice change.   Eyes: Negative.   Respiratory:  Positive for shortness of breath and wheezing. Negative for apnea, cough, choking, chest tightness and stridor.   Cardiovascular: Negative.  Negative for chest pain, palpitations and leg swelling.  Gastrointestinal: Negative.  Negative for abdominal distention, abdominal pain, constipation, diarrhea, nausea and vomiting.  Genitourinary: Negative.   Musculoskeletal: Negative.  Negative for arthralgias and myalgias.  Skin: Negative.  Negative for rash.  Allergic/Immunologic:  Negative.  Negative for environmental allergies and food allergies.  Neurological: Negative.  Negative for dizziness, syncope, weakness and headaches.  Hematological: Negative.  Negative for adenopathy. Does not bruise/bleed easily.  Psychiatric/Behavioral: Negative.  Negative for agitation and sleep disturbance. The patient is not nervous/anxious.      Objective:    Physical Exam No exam this is a video visit patient is in no distress on video BP 110/64  Wt Readings from Last 3 Encounters:  08/24/21 212 lb (96.2 kg)  06/28/21 217 lb (98.4 kg)  01/26/21 225 lb (102.1 kg)     Health Maintenance Due  Topic Date Due   Zoster Vaccines- Shingrix (1 of 2) Never done   COVID-19 Vaccine (3 - Booster for Moderna series) 09/20/2020    There are no preventive care reminders to display for this patient.  No results found for: TSH Lab Results  Component Value Date   WBC 7.0 06/28/2021   HGB 11.2 06/28/2021   HCT 34.2 06/28/2021   MCV 86 06/28/2021   PLT 299 06/28/2021   Lab Results  Component Value Date   NA 145 (H) 06/28/2021   K 4.4 06/28/2021   CO2 29 06/28/2021   GLUCOSE 114 (H) 06/28/2021   BUN 11 06/28/2021   CREATININE 0.84 06/28/2021   BILITOT <0.2 06/28/2021   ALKPHOS 80 06/28/2021   AST 13 06/28/2021    ALT 9 06/28/2021   PROT 7.3 06/28/2021   ALBUMIN 4.7 06/28/2021   CALCIUM 9.8 06/28/2021   ANIONGAP 7 11/04/2018   EGFR 77 06/28/2021   Lab Results  Component Value Date   CHOL 343 (H) 06/28/2021   Lab Results  Component Value Date   HDL 135 06/28/2021   Lab Results  Component Value Date   LDLCALC 201 (H) 06/28/2021   Lab Results  Component Value Date   TRIG 61 06/28/2021   Lab Results  Component Value Date   CHOLHDL 2.5 06/28/2021   No results found for: HGBA1C    Assessment & Plan:   Problem List Items Addressed This Visit       Cardiovascular and Mediastinum   Hypertension    Recent blood pressure by the EM T visit was normal continue current medications        Respiratory   COPD GOLD D (chronic obstructive pulmonary disease) (HCC)    Continue inhaled medications refills given      Chronic respiratory failure with hypoxia and hypercapnia (HCC)    Chronic respiratory failure continue current oxygen treatment 4 L      Centrilobular emphysema (HCC)    Centrilobular emphysema stable at this time        Other   Impaired mobility and personal care    Patient improved with motorized wheelchair      Mixed hyperlipidemia    Continue statin       No orders of the defined types were placed in this encounter.   Follow Up Instructions:  Patient knows a follow-up video visit will occur in the next 2 months I discussed the assessment and treatment plan with the patient. The patient was provided an opportunity to ask questions and all were answered. The patient agreed with the plan and demonstrated an understanding of the instructions.   The patient was advised to call back or seek an in-person evaluation if the symptoms worsen or if the condition fails to improve as anticipated.  I provided 28 minutes of non-face-to-face time during this encounter  including  median  intraservice time , review of notes, labs, imaging, medications  and explaining  diagnosis and management to the patient .    Asencion Noble, MD

## 2021-09-28 NOTE — Telephone Encounter (Signed)
Copied from [place] [id number]. Topic: General - Other >> Sep 28, 2021 12:29 PM Yvette Rack wrote: Reason for CRM: Pt requests call back to discuss Rx for shower chair that she can move in and out of the tub on her own. Pt requests call back. Cb# [phone]

## 2021-09-28 NOTE — Assessment & Plan Note (Signed)
Continue statin. 

## 2021-09-28 NOTE — Assessment & Plan Note (Signed)
Recent blood pressure by the EM T visit was normal continue current medications

## 2021-09-28 NOTE — Assessment & Plan Note (Signed)
Centrilobular emphysema stable at this time

## 2021-09-28 NOTE — Assessment & Plan Note (Signed)
Continue inhaled medications refills given

## 2021-10-02 MED ORDER — AZITHROMYCIN 250 MG PO TABS: ORAL_TABLET | ORAL | 0 refills | Status: DC

## 2021-10-02 NOTE — Telephone Encounter (Signed)
Call returned to patient.  She explained that her tub transfer bench broke and now she is requesting a regular shower chair that will support 300 lbs.  She is agreeable to having the order sent to [facility]. She is aware that that her insurance company may not cover this.   She also explained that she has a sore throat and runny nose and would like an antibiotic to " catch this in time."  She denied any fever.  Informed her that Dr Joya Gaskins would be notified of her request.

## 2021-10-02 NOTE — Telephone Encounter (Signed)
Order for shower chair faxed to [facility]. Call placed to patient and informed her that the shower chair order has been placed and Dr Joya Gaskins sent a prescription for an antibiotic to [facility].   She was very Patent attorney.

## 2021-10-02 NOTE — Telephone Encounter (Signed)
Dme order sent for shower chair, carly pull order to give to jane  ABX sent to [facility]

## 2021-10-03 NOTE — Telephone Encounter (Signed)
Patient called in concerned that she has to pay for shower chair. Please call back

## 2021-10-03 NOTE — Telephone Encounter (Signed)
Advised patient to reach out to [facility] and ask about payment.   Verbalized understanding.

## 2021-10-19 ENCOUNTER — Other Ambulatory Visit: Payer: Self-pay | Admitting: Critical Care Medicine

## 2021-10-19 DIAGNOSIS — U071 COVID-19: Secondary | ICD-10-CM

## 2021-10-19 MED ORDER — ALBUTEROL SULFATE HFA 108 (90 BASE) MCG/ACT IN AERS: 2.0000 | INHALATION_SPRAY | Freq: Four times a day (QID) | RESPIRATORY_TRACT | 2 refills | Status: DC | PRN

## 2021-10-19 NOTE — Telephone Encounter (Signed)
Medication Refill - Medication:  ?albuterol (VENTOLIN HFA) 108 (90 Base) MCG/ACT inhaler  ? ?Has the patient contacted their pharmacy? Yes.   ?Contact PCP ? ?Preferred Pharmacy (with phone number or street name):  ?[place], [place] - [address]  ?[place], [place] [address]  ?Phone:  [phone]  Fax:  [phone] ? ?Has the patient been seen for an appointment in the last year OR does the patient have an upcoming appointment? Yes.   ? ?Agent: Please be advised that RX refills may take up to 3 business days. We ask that you follow-up with your pharmacy. ?

## 2021-10-19 NOTE — Telephone Encounter (Signed)
Requested Prescriptions  ?Pending Prescriptions Disp Refills  ?? albuterol (VENTOLIN HFA) 108 (90 Base) MCG/ACT inhaler 8 g 0  ?  Sig: Inhale 2 puffs into the lungs every 6 (six) hours as needed for wheezing or shortness of breath.  ?  ? Pulmonology:  Beta Agonists 2 Passed - 10/19/2021 11:06 AM  ?  ?  Passed - Last BP in normal range  ?  BP Readings from Last 1 Encounters:  ?09/28/21 110/64  ?   ?  ?  Passed - Last Heart Rate in normal range  ?  Pulse Readings from Last 1 Encounters:  ?08/24/21 96  ?   ?  ?  Passed - Valid encounter within last 12 months  ?  Recent Outpatient Visits   ?      ? 3 weeks ago Chronic respiratory failure with hypoxia and hypercapnia ([place])  ? [place] Elsie Stain, MD  ? 2 months ago Upper respiratory tract infection, unspecified type  ? Primary Care at [facility], Kriste Basque, NP  ? 3 months ago Upper respiratory tract infection, unspecified type  ? Primary Care at [facility], Kriste Basque, NP  ? 3 months ago Impaired mobility and personal care  ? [place] Elsie Stain, MD  ? 5 months ago Mixed simple and mucopurulent chronic bronchitis ([place])  ? [place] Elsie Stain, MD  ?  ?  ?Future Appointments   ?        ? In 1 month Joya Gaskins Burnett Harry, MD [place]  ?  ? ?  ?  ?  ? ? ?

## 2021-10-20 ENCOUNTER — Other Ambulatory Visit: Payer: Self-pay | Admitting: Critical Care Medicine

## 2021-10-20 DIAGNOSIS — U071 COVID-19: Secondary | ICD-10-CM

## 2021-10-20 NOTE — Telephone Encounter (Signed)
Medication Refill - Medication:  ?VENTOLIN HFA 108 (90 Base) MCG/ACT inhaler  ? ?Has the patient contacted their pharmacy? Yes.   ?Contact PCP ? ?Preferred Pharmacy (with phone number or street name):  ?[place], [place] - [address]  ?[place], [place] [address]  ?Phone:  [phone]  Fax:  [phone]  ? ?Has the patient been seen for an appointment in the last year OR does the patient have an upcoming appointment? Yes.   ? ?Agent: Please be advised that RX refills may take up to 3 business days. We ask that you follow-up with your pharmacy. ?

## 2021-10-20 NOTE — Telephone Encounter (Signed)
Patient picked up prescription and is aware per pharmacist , ventolin and albuterol is the same drug.  ?Requested Prescriptions  ?Refused Prescriptions Disp Refills  ?? albuterol (VENTOLIN HFA) 108 (90 Base) MCG/ACT inhaler 8 g 2  ?  Sig: Inhale 2 puffs into the lungs every 6 (six) hours as needed for wheezing or shortness of breath.  ?  ? Pulmonology:  Beta Agonists 2 Passed - 10/20/2021  3:57 PM  ?  ?  Passed - Last BP in normal range  ?  BP Readings from Last 1 Encounters:  ?09/28/21 110/64  ?   ?  ?  Passed - Last Heart Rate in normal range  ?  Pulse Readings from Last 1 Encounters:  ?08/24/21 96  ?   ?  ?  Passed - Valid encounter within last 12 months  ?  Recent Outpatient Visits   ?      ? 3 weeks ago Chronic respiratory failure with hypoxia and hypercapnia ([place])  ? [place] Elsie Stain, MD  ? 2 months ago Upper respiratory tract infection, unspecified type  ? Primary Care at [facility], Kriste Basque, NP  ? 3 months ago Upper respiratory tract infection, unspecified type  ? Primary Care at [facility], Kriste Basque, NP  ? 3 months ago Impaired mobility and personal care  ? [name], MD  ? 5 months ago Mixed simple and mucopurulent chronic bronchitis ([place])  ? [place] Elsie Stain, MD  ?  ?  ?Future Appointments   ?        ? In 1 month Joya Gaskins Burnett Harry, MD [place]  ?  ? ?  ?  ?  ? ?

## 2021-11-02 DIAGNOSIS — J449 Chronic obstructive pulmonary disease, unspecified: Secondary | ICD-10-CM | POA: Diagnosis not present

## 2021-11-09 ENCOUNTER — Telehealth: Payer: Self-pay | Admitting: Critical Care Medicine

## 2021-11-09 NOTE — Telephone Encounter (Signed)
Refill not appropriate

## 2021-11-09 NOTE — Telephone Encounter (Signed)
Medication Refill - Medication: clonazePAM (KLONOPIN) 0.5 MG tablet ? ?Has the patient contacted their pharmacy? No. ?(Agent: If no, request that the patient contact the pharmacy for the refill. If patient does not wish to contact the pharmacy document the reason why and proceed with request.) ?(Agent: If yes, when and what did the pharmacy advise?) Rx discontinued/ expired  ? ?Preferred Pharmacy (with phone number or street name): [facility]  ?Has the patient been seen for an appointment in the last year OR does the patient have an upcoming appointment? Yes.   ? ?Agent: Please be advised that RX refills may take up to 3 business days. We ask that you follow-up with your pharmacy. ?

## 2021-11-10 MED ORDER — HYDROXYZINE HCL 25 MG PO TABS: 25.0000 mg | ORAL_TABLET | Freq: Three times a day (TID) | ORAL | 0 refills | Status: DC | PRN

## 2021-11-10 NOTE — Addendum Note (Signed)
Addended byCharlott Rakes on: 11/10/2021 05:44 PM ? ? Modules accepted: Orders ? ?

## 2021-11-10 NOTE — Telephone Encounter (Signed)
I have sent a Prescription for Hydroxyzine to the Pharmacy ?

## 2021-11-10 NOTE — Telephone Encounter (Signed)
Called pt and she is aware of note. Pt states she is still getting anxiety and out of breath is there anything else that you can prescribe her ?  ?

## 2021-11-10 NOTE — Telephone Encounter (Signed)
Pt called and is requesting a call back, has questions for clinic please advise  ? ?Best contact: [phone] ?

## 2021-11-13 NOTE — Telephone Encounter (Signed)
Called pt and she is aware of medications ?

## 2021-11-14 ENCOUNTER — Telehealth (HOSPITAL_BASED_OUTPATIENT_CLINIC_OR_DEPARTMENT_OTHER): Payer: Medicare Other | Admitting: Nurse Practitioner

## 2021-11-14 ENCOUNTER — Encounter: Payer: Self-pay | Admitting: Nurse Practitioner

## 2021-11-14 DIAGNOSIS — F411 Generalized anxiety disorder: Secondary | ICD-10-CM

## 2021-11-14 MED ORDER — CLONAZEPAM 0.5 MG PO TABS: 0.5000 mg | ORAL_TABLET | Freq: Two times a day (BID) | ORAL | 0 refills | Status: DC | PRN

## 2021-11-14 NOTE — Progress Notes (Signed)
Virtual Visit via Telephone Note ?Due to national recommendations of social distancing due to [place] 19, telehealth visit is felt to be most appropriate for this patient at this time.  I discussed the limitations, risks, security and privacy concerns of performing an evaluation and management service by telephone and the availability of in person appointments. I also discussed with the patient that there may be a patient responsible charge related to this service. The patient expressed understanding and agreed to proceed.  ? ? ?I connected with Bianca Murray on 11/14/21  at   8:30 AM EDT  EDT by telephone and verified that I am speaking with the correct person using two identifiers. ? ?Location of Patient: ?Private Residence ?  ?Location of Provider: ?Scientist, research (physical sciences) and [employer] Office  ?  ?Persons participating in Telemedicine visit: ?Geryl Rankins FNP-BC ?Bianca Murray  ?  ?History of Present Illness: ?Telemedicine visit for: GAD ?She has a past medical history of Arthritis, Asthma, Cancer, Chronic respiratory failure with hypoxia and hypercapnia on chronic O2, COPD, and Hypertension.  ? ? ?Patient states she was previously prescribed klonopin for her anxiety and "breathing". Just recently was switched to hydroxyzine and reports this as ineffective. States it only makes her sleepy and she feels like her breathing has gotten worse since she started the hydroxyzine. She ran out of klonopin last week.  ? ? ? ?She checks her blood pressure at home.  ?She no longer has the paramedicine program.  ? ?Last dose of klonopin was last week.  ?Past Medical History:  ?Diagnosis Date  ? Arthritis   ? Asthma   ? [facility])   ? Chronic respiratory failure with hypoxia and hypercapnia (HCC)   ? COPD (chronic obstructive pulmonary disease) ([place])   ? Hypertension   ?  ?Past Surgical History:  ?Procedure Laterality Date  ? ABDOMINAL HYSTERECTOMY    ? TRACHEOSTOMY    ? reversed  ? TRACHEOSTOMY CLOSURE    ?  ?Family History   ?Problem Relation Age of Onset  ? Hypertension Mother   ? Heart disease Mother   ? Cancer Mother   ? Asthma Father   ? Asthma Sister   ? Hypertension Sister   ? Asthma Brother   ? Cancer Brother   ?  ?Social History  ? ?Socioeconomic History  ? Marital status: Single  ?  Spouse name: Not on file  ? Number of children: Not on file  ? Years of education: Not on file  ? Highest education level: Not on file  ?Occupational History  ? Not on file  ?Tobacco Use  ? Smoking status: Former  ? Smokeless tobacco: Never  ? Tobacco comments:  ?  quit smoking in 2018    ?Vaping Use  ? Vaping Use: Never used  ?Substance and Sexual Activity  ? Alcohol use: Not Currently  ? Drug use: Not Currently  ? Sexual activity: Not on file  ?Other Topics Concern  ? Not on file  ?Social History Narrative  ? Not on file  ? ?Social Determinants of Health  ? ?Financial Resource Strain: Not on file  ?Food Insecurity: Not on file  ?Transportation Needs: Not on file  ?Physical Activity: Not on file  ?Stress: Not on file  ?Social Connections: Not on file  ?  ? ?Observations/Objective: ?Awake, alert and oriented x 3 ? ? ?Review of Systems  ?Constitutional:  Negative for fever, malaise/fatigue and weight loss.  ?HENT: Negative.  Negative for nosebleeds.   ?Eyes: Negative.  Negative  for blurred vision, double vision and photophobia.  ?Respiratory:  Positive for shortness of breath. Negative for cough.   ?Cardiovascular: Negative.  Negative for chest pain, palpitations and leg swelling.  ?Gastrointestinal: Negative.  Negative for heartburn, nausea and vomiting.  ?Musculoskeletal: Negative.  Negative for myalgias.  ?Neurological: Negative.  Negative for dizziness, focal weakness, seizures and headaches.  ?Psychiatric/Behavioral:  Negative for suicidal ideas. The patient is nervous/anxious.    ?Assessment and Plan: ?Diagnoses and all orders for this visit: ? ?Generalized anxiety disorder ?-     clonazePAM (KLONOPIN) 0.5 MG tablet; Take 1 tablet (0.5 mg  total) by mouth 2 (two) times daily as needed for up to 14 days for anxiety. COURTESY REFILL ONLY. ?I have refilled her klonopin until she can see her PCP. I was unable to verify in her chart why her klonopin was discontinued and patient denies any intolerance or side effects from taking it in the past.  ? ?  ? ?Follow Up Instructions ?Return if symptoms worsen or fail to improve.  ? ?  ?I discussed the assessment and treatment plan with the patient. The patient was provided an opportunity to ask questions and all were answered. The patient agreed with the plan and demonstrated an understanding of the instructions. ?  ?The patient was advised to call back or seek an in-person evaluation if the symptoms worsen or if the condition fails to improve as anticipated. ? ?I provided 13 minutes of non-face-to-face time during this encounter including median intraservice time, reviewing previous notes, labs, imaging, medications and explaining diagnosis and management. ? ?Gildardo Pounds, FNP-BC  ?

## 2021-11-16 DIAGNOSIS — J449 Chronic obstructive pulmonary disease, unspecified: Secondary | ICD-10-CM | POA: Diagnosis not present

## 2021-11-23 ENCOUNTER — Telehealth: Payer: Medicare Other | Admitting: Physician Assistant

## 2021-11-23 ENCOUNTER — Ambulatory Visit: Payer: Self-pay | Admitting: *Deleted

## 2021-11-23 DIAGNOSIS — J9611 Chronic respiratory failure with hypoxia: Secondary | ICD-10-CM

## 2021-11-23 DIAGNOSIS — J432 Centrilobular emphysema: Secondary | ICD-10-CM | POA: Diagnosis not present

## 2021-11-23 DIAGNOSIS — R051 Acute cough: Secondary | ICD-10-CM | POA: Diagnosis not present

## 2021-11-23 DIAGNOSIS — J418 Mixed simple and mucopurulent chronic bronchitis: Secondary | ICD-10-CM | POA: Diagnosis not present

## 2021-11-23 DIAGNOSIS — J9612 Chronic respiratory failure with hypercapnia: Secondary | ICD-10-CM

## 2021-11-23 MED ORDER — AZITHROMYCIN 250 MG PO TABS: ORAL_TABLET | ORAL | 0 refills | Status: AC

## 2021-11-23 MED ORDER — PROMETHAZINE-DM 6.25-15 MG/5ML PO SYRP: 5.0000 mL | ORAL_SOLUTION | Freq: Four times a day (QID) | ORAL | 0 refills | Status: DC | PRN

## 2021-11-23 NOTE — Telephone Encounter (Signed)
Called patient's emergency contact [place] and daughter at work but reports patient is outside. Daughter will notify patient clinic will attempt to contact her regarding sx in approx. 10 minutes . ?

## 2021-11-23 NOTE — Progress Notes (Signed)
?Virtual Visit Consent  ? ?[name], you are scheduled for a virtual visit with a [facility] provider today.   ?  ?Just as with appointments in the office, your consent must be obtained to participate.  Your consent will be active for this visit and any virtual visit you may have with one of our providers in the next 365 days.   ?  ?If you have a MyChart account, a copy of this consent can be sent to you electronically.  All virtual visits are billed to your insurance company just like a traditional visit in the office.   ? ?As this is a virtual visit, video technology does not allow for your provider to perform a traditional examination.  This may limit your provider's ability to fully assess your condition.  If your provider identifies any concerns that need to be evaluated in person or the need to arrange testing (such as labs, EKG, etc.), we will make arrangements to do so.   ?  ?Although advances in technology are sophisticated, we cannot ensure that it will always work on either your end or our end.  If the connection with a video visit is poor, the visit may have to be switched to a telephone visit.  With either a video or telephone visit, we are not always able to ensure that we have a secure connection.    ? ?I need to obtain your verbal consent now.   Are you willing to proceed with your visit today?  ?  ?Tyara Dassow has provided verbal consent on 11/23/2021 for a virtual visit (video or telephone). ?  ?Mar Daring, PA-C  ? ?Date: 11/23/2021 3:24 PM ? ? ?Virtual Visit via Video Note  ? ?Reynolds Bowl, connected with  Ambar Raphael  ([id number], 1956/07/16) on 11/23/21 at  3:15 PM EDT by a video-enabled telemedicine application and verified that I am speaking with the correct person using two identifiers. ? ?Location: ?Patient: Virtual Visit Location Patient: Home ?Provider: Virtual Visit Location Provider: Home Office ?  ?I discussed the limitations of evaluation and management by  telemedicine and the availability of in person appointments. The patient expressed understanding and agreed to proceed.   ? ?History of Present Illness: ?Bianca Murray is a 66 y.o. who identifies as a female who was assigned female at birth, and is being seen today for worsening cough. ? ?HPI: Cough ?This is a new problem. The current episode started in the past 7 days. The problem has been gradually worsening. The problem occurs every few minutes. The cough is Productive of sputum. Associated symptoms include chest pain, nasal congestion, postnasal drip, shortness of breath and wheezing. Pertinent negatives include no chills or fever. The symptoms are aggravated by lying down, dust, pollens and cold air. She has tried a beta-agonist inhaler, body position changes, steroid inhaler, rest, leukotriene antagonists and ipratropium inhaler for the symptoms. The treatment provided no relief. Her past medical history is significant for bronchitis, COPD, emphysema, environmental allergies and pneumonia.   ? ? ?Problems:  ?Patient Active Problem List  ? Diagnosis Date Noted  ? Mixed hyperlipidemia 06/29/2021  ? Impaired mobility and personal care 06/28/2021  ? Chronic anxiety 09/12/2020  ? Generalized anxiety disorder 09/12/2020  ? GERD (gastroesophageal reflux disease) 03/22/2020  ? Hypertension 12/03/2018  ? Centrilobular emphysema ([place]) 12/03/2018  ? Vitamin D deficiency 11/27/2018  ? Chronic respiratory failure with hypoxia and hypercapnia (HCC)   ? COPD GOLD D (chronic obstructive pulmonary disease) ([place])  10/28/2018  ?  ?Allergies: No Known Allergies ?Medications:  ?Current Outpatient Medications:  ?  azithromycin (ZITHROMAX) 250 MG tablet, Take 2 tablets on day 1, then 1 tablet daily on days 2 through 5, Disp: 6 tablet, Rfl: 0 ?  promethazine-dextromethorphan (PROMETHAZINE-DM) 6.25-15 MG/5ML syrup, Take 5 mLs by mouth 4 (four) times daily as needed., Disp: 118 mL, Rfl: 0 ?  albuterol (PROVENTIL) (2.5 MG/3ML) 0.083%  nebulizer solution, Take 3 mLs (2.5 mg total) by nebulization 3 (three) times daily., Disp: 270 mL, Rfl: 1 ?  albuterol (VENTOLIN HFA) 108 (90 Base) MCG/ACT inhaler, Inhale 2 puffs into the lungs every 6 (six) hours as needed for wheezing or shortness of breath., Disp: 8 g, Rfl: 2 ?  amLODipine (NORVASC) 5 MG tablet, TAKE 1 TABLET(5 MG) BY MOUTH DAILY, Disp: 90 tablet, Rfl: 1 ?  atorvastatin (LIPITOR) 10 MG tablet, Take 1 tablet (10 mg total) by mouth daily., Disp: 90 tablet, Rfl: 3 ?  azelastine (ASTELIN) 0.1 % nasal spray, Place 2 sprays into both nostrils 2 (two) times daily. Use in each nostril as directed, Disp: 30 mL, Rfl: 12 ?  Blood Pressure Monitoring (BLOOD PRESSURE MONITOR AUTOMAT) DEVI, Measure blood pressure and pulse daily, Disp: 1 Device, Rfl: 0 ?  Budeson-Glycopyrrol-Formoterol (BREZTRI AEROSPHERE) 160-9-4.8 MCG/ACT AERO, Inhale 2 puffs into the lungs in the morning and at bedtime., Disp: 10.7 g, Rfl: 6 ?  clonazePAM (KLONOPIN) 0.5 MG tablet, Take 1 tablet (0.5 mg total) by mouth 2 (two) times daily as needed for up to 14 days for anxiety. COURTESY REFILL ONLY., Disp: 28 tablet, Rfl: 0 ?  cyclobenzaprine (FLEXERIL) 10 MG tablet, Take 1 tablet (10 mg total) by mouth 3 (three) times daily as needed for muscle spasms., Disp: 60 tablet, Rfl: 1 ?  fluticasone (FLONASE) 50 MCG/ACT nasal spray, Place 2 sprays into both nostrils daily., Disp: 16 g, Rfl: 6 ?  gabapentin (NEURONTIN) 100 MG capsule, TAKE 1 CAPSULE (100 MG TOTAL) BY MOUTH 3 (THREE) TIMES DAILY. (AM+NOON+BEDTIME), Disp: 90 capsule, Rfl: 3 ?  hydrOXYzine (ATARAX) 25 MG tablet, Take 1 tablet (25 mg total) by mouth 3 (three) times daily as needed for anxiety., Disp: 60 tablet, Rfl: 0 ?  ibuprofen (IBU) 600 MG tablet, TAKE 1 TABLET(600 MG) BY MOUTH EVERY 6 HOURS AS NEEDED, Disp: 90 tablet, Rfl: 2 ?  loratadine (ALLERGY RELIEF) 10 MG tablet, TAKE ONE TABLET BY MOUTH ONCE DAILY (AM), Disp: 90 tablet, Rfl: 0 ?  losartan (COZAAR) 100 MG tablet, Take 1  tablet (100 mg total) by mouth daily., Disp: 90 tablet, Rfl: 2 ?  Misc. Devices (PULSE OXIMETER FOR FINGER) MISC, 1 application by Does not apply route daily as needed., Disp: 1 each, Rfl: 0 ?  Misc. Devices MISC, Nebulizer machine, Large shower chair.  Diagnosis- chronic respiratory failure, Disp: 1 each, Rfl: 0 ?  pantoprazole (PROTONIX) 40 MG tablet, TAKE 1 TABLET BY MOUTH 2 (TWO) TIMES DAILY BEFORE A MEAL (AM+EVENING), Disp: 60 tablet, Rfl: 2 ?  polyethylene glycol powder (GLYCOLAX/MIRALAX) 17 GM/SCOOP powder, Take 17 g by mouth daily as needed., Disp: 507 g, Rfl: 1 ?  Spacer/Aero-Holding Chambers (AEROCHAMBER MV) inhaler, Use as instructed, Disp: 1 each, Rfl: 0 ?  VENTOLIN HFA 108 (90 Base) MCG/ACT inhaler, INHALE TWO PUFFS BY MOUTH EVERY SIX HOURS AS NEEDED FOR WHEEZING OR SHORTNESS OF BREATH, Disp: 18 g, Rfl: 0 ?  Vitamin D, Ergocalciferol, (DRISDOL) 1.25 MG (50000 UNIT) CAPS capsule, Take 1 capsule (50,000 Units total) by mouth every  7 (seven) days., Disp: 12 capsule, Rfl: 1 ? ?Observations/Objective: ?Patient is well-developed, well-nourished in no acute distress.  ?Resting comfortably at home.  ?Head is normocephalic, atraumatic.  ?No labored breathing.  ?Speech is clear and coherent with logical content.  ?Patient is alert and oriented at baseline.  ? ? ?Assessment and Plan: ?1. Acute cough ?- azithromycin (ZITHROMAX) 250 MG tablet; Take 2 tablets on day 1, then 1 tablet daily on days 2 through 5  Dispense: 6 tablet; Refill: 0 ?- promethazine-dextromethorphan (PROMETHAZINE-DM) 6.25-15 MG/5ML syrup; Take 5 mLs by mouth 4 (four) times daily as needed.  Dispense: 118 mL; Refill: 0 ? ?2. Mixed simple and mucopurulent chronic bronchitis (HCC) ?- azithromycin (ZITHROMAX) 250 MG tablet; Take 2 tablets on day 1, then 1 tablet daily on days 2 through 5  Dispense: 6 tablet; Refill: 0 ?- promethazine-dextromethorphan (PROMETHAZINE-DM) 6.25-15 MG/5ML syrup; Take 5 mLs by mouth 4 (four) times daily as needed.   Dispense: 118 mL; Refill: 0 ? ?3. Chronic respiratory failure with hypoxia and hypercapnia (HCC) ?- azithromycin (ZITHROMAX) 250 MG tablet; Take 2 tablets on day 1, then 1 tablet daily on days 2 through 5  Dispense:

## 2021-11-23 NOTE — Telephone Encounter (Signed)
Summary: Cough/Chest congestion  ? Pt called to report that she has a cough and drainage that is going into her chest,  ? ?Best contact: [phone]  ? ?[place], [place] - [address]  ?[place] [address]  ?Phone: [phone] Fax: [phone]   ?  ? ?Called [phone] and call could not be completed as dialed. Will try again later.  ?

## 2021-11-23 NOTE — Patient Instructions (Signed)
?Bianca Murray, thank you for joining Mar Daring, PA-C for today's virtual visit.  While this provider is not your primary care provider (PCP), if your PCP is located in our provider database this encounter information will be shared with them immediately following your visit. ? ?Consent: ?(Patient) Bianca Murray provided verbal consent for this virtual visit at the beginning of the encounter. ? ?Current Medications: ? ?Current Outpatient Medications:  ?  azithromycin (ZITHROMAX) 250 MG tablet, Take 2 tablets on day 1, then 1 tablet daily on days 2 through 5, Disp: 6 tablet, Rfl: 0 ?  promethazine-dextromethorphan (PROMETHAZINE-DM) 6.25-15 MG/5ML syrup, Take 5 mLs by mouth 4 (four) times daily as needed., Disp: 118 mL, Rfl: 0 ?  albuterol (PROVENTIL) (2.5 MG/3ML) 0.083% nebulizer solution, Take 3 mLs (2.5 mg total) by nebulization 3 (three) times daily., Disp: 270 mL, Rfl: 1 ?  albuterol (VENTOLIN HFA) 108 (90 Base) MCG/ACT inhaler, Inhale 2 puffs into the lungs every 6 (six) hours as needed for wheezing or shortness of breath., Disp: 8 g, Rfl: 2 ?  amLODipine (NORVASC) 5 MG tablet, TAKE 1 TABLET(5 MG) BY MOUTH DAILY, Disp: 90 tablet, Rfl: 1 ?  atorvastatin (LIPITOR) 10 MG tablet, Take 1 tablet (10 mg total) by mouth daily., Disp: 90 tablet, Rfl: 3 ?  azelastine (ASTELIN) 0.1 % nasal spray, Place 2 sprays into both nostrils 2 (two) times daily. Use in each nostril as directed, Disp: 30 mL, Rfl: 12 ?  Blood Pressure Monitoring (BLOOD PRESSURE MONITOR AUTOMAT) DEVI, Measure blood pressure and pulse daily, Disp: 1 Device, Rfl: 0 ?  Budeson-Glycopyrrol-Formoterol (BREZTRI AEROSPHERE) 160-9-4.8 MCG/ACT AERO, Inhale 2 puffs into the lungs in the morning and at bedtime., Disp: 10.7 g, Rfl: 6 ?  clonazePAM (KLONOPIN) 0.5 MG tablet, Take 1 tablet (0.5 mg total) by mouth 2 (two) times daily as needed for up to 14 days for anxiety. COURTESY REFILL ONLY., Disp: 28 tablet, Rfl: 0 ?  cyclobenzaprine (FLEXERIL) 10 MG  tablet, Take 1 tablet (10 mg total) by mouth 3 (three) times daily as needed for muscle spasms., Disp: 60 tablet, Rfl: 1 ?  fluticasone (FLONASE) 50 MCG/ACT nasal spray, Place 2 sprays into both nostrils daily., Disp: 16 g, Rfl: 6 ?  gabapentin (NEURONTIN) 100 MG capsule, TAKE 1 CAPSULE (100 MG TOTAL) BY MOUTH 3 (THREE) TIMES DAILY. (AM+NOON+BEDTIME), Disp: 90 capsule, Rfl: 3 ?  hydrOXYzine (ATARAX) 25 MG tablet, Take 1 tablet (25 mg total) by mouth 3 (three) times daily as needed for anxiety., Disp: 60 tablet, Rfl: 0 ?  ibuprofen (IBU) 600 MG tablet, TAKE 1 TABLET(600 MG) BY MOUTH EVERY 6 HOURS AS NEEDED, Disp: 90 tablet, Rfl: 2 ?  loratadine (ALLERGY RELIEF) 10 MG tablet, TAKE ONE TABLET BY MOUTH ONCE DAILY (AM), Disp: 90 tablet, Rfl: 0 ?  losartan (COZAAR) 100 MG tablet, Take 1 tablet (100 mg total) by mouth daily., Disp: 90 tablet, Rfl: 2 ?  Misc. Devices (PULSE OXIMETER FOR FINGER) MISC, 1 application by Does not apply route daily as needed., Disp: 1 each, Rfl: 0 ?  Misc. Devices MISC, Nebulizer machine, Large shower chair.  Diagnosis- chronic respiratory failure, Disp: 1 each, Rfl: 0 ?  pantoprazole (PROTONIX) 40 MG tablet, TAKE 1 TABLET BY MOUTH 2 (TWO) TIMES DAILY BEFORE A MEAL (AM+EVENING), Disp: 60 tablet, Rfl: 2 ?  polyethylene glycol powder (GLYCOLAX/MIRALAX) 17 GM/SCOOP powder, Take 17 g by mouth daily as needed., Disp: 507 g, Rfl: 1 ?  Spacer/Aero-Holding Chambers (AEROCHAMBER MV) inhaler, Use as  instructed, Disp: 1 each, Rfl: 0 ?  VENTOLIN HFA 108 (90 Base) MCG/ACT inhaler, INHALE TWO PUFFS BY MOUTH EVERY SIX HOURS AS NEEDED FOR WHEEZING OR SHORTNESS OF BREATH, Disp: 18 g, Rfl: 0 ?  Vitamin D, Ergocalciferol, (DRISDOL) 1.25 MG (50000 UNIT) CAPS capsule, Take 1 capsule (50,000 Units total) by mouth every 7 (seven) days., Disp: 12 capsule, Rfl: 1  ? ?Medications ordered in this encounter:  ?Meds ordered this encounter  ?Medications  ? azithromycin (ZITHROMAX) 250 MG tablet  ?  Sig: Take 2 tablets on day  1, then 1 tablet daily on days 2 through 5  ?  Dispense:  6 tablet  ?  Refill:  0  ?  Order Specific Question:   Supervising Provider  ?  Answer:   Noemi Chapel [[id number]]  ? promethazine-dextromethorphan (PROMETHAZINE-DM) 6.25-15 MG/5ML syrup  ?  Sig: Take 5 mLs by mouth 4 (four) times daily as needed.  ?  Dispense:  118 mL  ?  Refill:  0  ?  Order Specific Question:   Supervising Provider  ?  Answer:   Noemi Chapel [[id number]]  ?  ? ?*If you need refills on other medications prior to your next appointment, please contact your pharmacy* ? ?Follow-Up: ?Call back or seek an in-person evaluation if the symptoms worsen or if the condition fails to improve as anticipated. ? ?Other Instructions ?Chronic Obstructive Pulmonary Disease Exacerbation ?Chronic obstructive pulmonary disease (COPD) is a long-term (chronic) lung problem. In COPD, the flow of air from the lungs is limited. ?COPD exacerbations are times that breathing gets worse and you need more than your normal treatment. Without treatment, they can be life-threatening. If they happen often, your lungs can become more damaged. ?What are the causes? ?Having infections that affect your airways and lungs. ?Being exposed to: ?Smoke. ?Air pollution. ?Chemical fumes. ?Dust. ?Things that can cause an allergic reaction (allergens). ?Not taking your usual COPD medicines as told. ?Having medical problems already, such as heart failure or infections not involving the lungs. ?In many cases, the cause is not known. ?What increases the risk? ?Smoking. ?Being an older adult. ?Having frequent prior COPD exacerbations. ?What are the signs or symptoms? ?Increased coughing. ?Increased mucus from your lungs. ?Increased wheezing. ?Increased shortness of breath. ?Fast breathing and finding it hard to breathe. ?Chest tightness. ?Less energy than usual. ?Sleep disruption from symptoms. ?Confusion. ?Increased sleepiness. ?Often, these symptoms happen or get worse even with the use of  medicines. ?How is this treated? ?Treatment for this condition depends on how bad it is and the cause of the symptoms. You may need to stay in the hospital for treatment. Treatment may include: ?Taking medicines. ?Using oxygen. ?Being treated with different ways to clear your airway, such as using a mask to deliver oxygen. ?Follow these instructions at home: ?Medicines ?Take over-the-counter and prescription medicines only as told by your doctor. ?Use all inhaled medicines the correct way. ?If you were prescribed an antibiotic or steroid medicine, take it as told by your doctor. Do not stop taking it even if you start to feel better. ?Lifestyle ?Do not smoke or use any products that contain nicotine or tobacco. If you need help quitting, ask your doctor. ?Eat healthy foods. ?Exercise regularly. ?Get enough sleep. Most adults need 7 or more hours per night. ?Avoid tobacco smoke and other things that can bother your lungs. ?Several times a day, wash your hands with soap and water for at least 20 seconds. If you cannot use soap and  water, use hand sanitizer. This may help keep you from getting an infection. ?During flu season, avoid areas that are crowded with people. ?General instructions ?Drink enough fluid to keep your pee (urine) pale yellow. Do not do this if your doctor has told you not to. ?Use a cool mist machine (vaporizer). ?If you use oxygen or a machine that turns medicine into a mist (nebulizer), continue to use it as told. ?Keep all follow-up visits. ?How is this prevented? ?Keep up with shots (vaccinations) as told by your doctor. Be sure to get a yearly flu (influenza) shot. ?If you smoke, quit smoking. Smoking makes the problem worse. ?Follow all instructions for rehabilitation. These are steps you can take to make your body work better. ?Work with your doctor to develop and follow an action plan. This tells you what steps to take when you experience certain symptoms. ?Contact a doctor if: ?Your COPD  symptoms get worse than normal. ?Get help right away if: ?You are short of breath and it gets worse, even when you are resting. ?You have trouble talking. ?You have chest pain. ?You cough up blood. ?You have a fev

## 2021-11-23 NOTE — Telephone Encounter (Signed)
?  Chief Complaint: Cough ?Symptoms: Cough with sputum ?Frequency: 2 days ?Pertinent Negatives: Patient denies fever ?Disposition: '[]'$ ED /'[]'$ Urgent Care (no appt availability in office) / '[x]'$ Appointment(In office/virtual)/ '[]'$  [facility]/ '[]'$ Home Care/ '[]'$ Refused Recommended Disposition /'[]'$ [facility]/ '[]'$  Follow-up with PCP ?Additional Notes: Pt has cough and COPD. Coughing for the past days. Also having mild SOB.  ? ?Reason for Disposition ? [1] Known COPD or other severe lung disease (i.e., bronchiectasis, cystic fibrosis, lung surgery) AND [2] worsening symptoms (i.e., increased sputum purulence or amount, increased breathing difficulty ? ?Answer Assessment - Initial Assessment Questions ?1. ONSET: "When did the cough begin?"  ?    2 days ago ?2. SEVERITY: "How bad is the cough today?"  ?    Night time ?3. SPUTUM: "Describe the color of your sputum" (none, dry cough; clear, white, yellow, green) ?    Green thick ?4. HEMOPTYSIS: "Are you coughing up any blood?" If so ask: "How much?" (flecks, streaks, tablespoons, etc.) ?    no ?5. DIFFICULTY BREATHING: "Are you having difficulty breathing?" If Yes, ask: "How bad is it?" (e.g., mild, moderate, severe)  ?  - MILD: No SOB at rest, mild SOB with walking, speaks normally in sentences, can lie down, no retractions, pulse < 100.  ?  - MODERATE: SOB at rest, SOB with minimal exertion and prefers to sit, cannot lie down flat, speaks in phrases, mild retractions, audible wheezing, pulse 100-120.  ?  - SEVERE: Very SOB at rest, speaks in single words, struggling to breathe, sitting hunched forward, retractions, pulse > 120  ?    mild ?6. FEVER: "Do you have a fever?" If Yes, ask: "What is your temperature, how was it measured, and when did it start?" ?    no ?7. CARDIAC HISTORY: "Do you have any history of heart disease?" (e.g., heart attack, congestive heart failure)  ?   no ?8. LUNG HISTORY: "Do you have any history of lung disease?"  (e.g., pulmonary  embolus, asthma, emphysema) ?    yes ?9. PE RISK FACTORS: "Do you have a history of blood clots?" (or: recent major surgery, recent prolonged travel, bedridden) ?    no ?10. OTHER SYMPTOMS: "Do you have any other symptoms?" (e.g., runny nose, wheezing, chest pain) ?      No fever, chest pain from coughing, runny nose ?11. PREGNANCY: "Is there any chance you are pregnant?" "When was your last menstrual period?" ?      na ?12. TRAVEL: "Have you traveled out of the country in the last month?" (e.g., travel history, exposures) ?      na ? ?Protocols used: Cough - Acute Productive-A-AH ? ?

## 2021-11-23 NOTE — Telephone Encounter (Signed)
2nd attempt. "Call cannot be completed at this time." Number provided . Will attempt to reach pt again ?

## 2021-11-23 NOTE — Telephone Encounter (Signed)
Attempted to contact patient again and [phone] confirmed with patient's daughter, reports call can not be completed as dialed. Please assist with advising patient regarding sx.  ?

## 2021-12-03 DIAGNOSIS — J449 Chronic obstructive pulmonary disease, unspecified: Secondary | ICD-10-CM | POA: Diagnosis not present

## 2021-12-05 ENCOUNTER — Telehealth (HOSPITAL_BASED_OUTPATIENT_CLINIC_OR_DEPARTMENT_OTHER): Payer: Medicare Other | Admitting: Critical Care Medicine

## 2021-12-05 ENCOUNTER — Encounter: Payer: Self-pay | Admitting: Critical Care Medicine

## 2021-12-05 DIAGNOSIS — J9611 Chronic respiratory failure with hypoxia: Secondary | ICD-10-CM | POA: Diagnosis not present

## 2021-12-05 DIAGNOSIS — Z7409 Other reduced mobility: Secondary | ICD-10-CM

## 2021-12-05 DIAGNOSIS — Z741 Need for assistance with personal care: Secondary | ICD-10-CM | POA: Diagnosis not present

## 2021-12-05 DIAGNOSIS — J418 Mixed simple and mucopurulent chronic bronchitis: Secondary | ICD-10-CM

## 2021-12-05 DIAGNOSIS — U071 COVID-19: Secondary | ICD-10-CM | POA: Diagnosis not present

## 2021-12-05 DIAGNOSIS — F411 Generalized anxiety disorder: Secondary | ICD-10-CM | POA: Diagnosis not present

## 2021-12-05 DIAGNOSIS — J9612 Chronic respiratory failure with hypercapnia: Secondary | ICD-10-CM

## 2021-12-05 DIAGNOSIS — J449 Chronic obstructive pulmonary disease, unspecified: Secondary | ICD-10-CM | POA: Diagnosis not present

## 2021-12-05 DIAGNOSIS — J9610 Chronic respiratory failure, unspecified whether with hypoxia or hypercapnia: Secondary | ICD-10-CM | POA: Diagnosis not present

## 2021-12-05 DIAGNOSIS — H5462 Unqualified visual loss, left eye, normal vision right eye: Secondary | ICD-10-CM | POA: Diagnosis not present

## 2021-12-05 MED ORDER — VITAMIN D (ERGOCALCIFEROL) 1.25 MG (50000 UNIT) PO CAPS: 50000.0000 [IU] | ORAL_CAPSULE | ORAL | 1 refills | Status: DC

## 2021-12-05 MED ORDER — CYCLOBENZAPRINE HCL 10 MG PO TABS: 10.0000 mg | ORAL_TABLET | Freq: Three times a day (TID) | ORAL | 1 refills | Status: DC | PRN

## 2021-12-05 MED ORDER — PANTOPRAZOLE SODIUM 40 MG PO TBEC: DELAYED_RELEASE_TABLET | ORAL | 2 refills | Status: DC

## 2021-12-05 MED ORDER — HYDROXYZINE HCL 25 MG PO TABS
25.0000 mg | ORAL_TABLET | Freq: Three times a day (TID) | ORAL | 3 refills | Status: DC | PRN
Start: 2021-12-05 — End: 2022-04-05

## 2021-12-05 MED ORDER — ATORVASTATIN CALCIUM 10 MG PO TABS: 10.0000 mg | ORAL_TABLET | Freq: Every day | ORAL | 3 refills | Status: DC

## 2021-12-05 MED ORDER — LOSARTAN POTASSIUM 100 MG PO TABS: 100.0000 mg | ORAL_TABLET | Freq: Every day | ORAL | 2 refills | Status: DC

## 2021-12-05 MED ORDER — GABAPENTIN 100 MG PO CAPS: ORAL_CAPSULE | ORAL | 3 refills | Status: DC

## 2021-12-05 MED ORDER — AMLODIPINE BESYLATE 5 MG PO TABS: ORAL_TABLET | ORAL | 1 refills | Status: DC

## 2021-12-05 MED ORDER — FLUTICASONE PROPIONATE 50 MCG/ACT NA SUSP: 2.0000 | Freq: Every day | NASAL | 6 refills | Status: DC

## 2021-12-05 MED ORDER — BREZTRI AEROSPHERE 160-9-4.8 MCG/ACT IN AERO: 2.0000 | INHALATION_SPRAY | Freq: Two times a day (BID) | RESPIRATORY_TRACT | 6 refills | Status: DC

## 2021-12-05 NOTE — Assessment & Plan Note (Signed)
Need to ascertain what happened with the left eye we will try to get records from the eye doctor ?

## 2021-12-05 NOTE — Progress Notes (Signed)
? ?Established Patient Office Visit ? ?Subjective:  ?Patient ID: Bianca Murray, female    DOB: 12-Sep-1955  Age: 66 y.o. MRN: [id number] ?Virtual Visit via Video Note ? ?I connected with Bianca Murray on 12/05/21 at 1030am by a video enabled telemedicine application and verified that I am speaking with the correct person using two identifiers. ?  ?Consent:  ?I discussed the limitations, risks, security and privacy concerns of performing an evaluation and management service by video visit and the availability of in person appointments. I also discussed with the patient that there may be a patient responsible charge related to this service. The patient expressed understanding and agreed to proceed. ? ?Location of patient: Patient's at home ? ?Location of provider: I am in my office ? ?Persons participating in the televisit with the patient.   ? ?No one else on the call ? ? ?History of Present Illness: ? ?  ?CC: Primary care follow-up ? ?HPI ?09/28/2021 ?Bianca Murray presents for primary care follow-up on video.  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  She does tell me she now has her motorized wheelchair and this is doing well inside her home.  Her breathing is at baseline.  She has gotten over the COVID illness she had in the past year.  She needs refills on multiple medications.  There are no primary care gaps active at this time.  She might benefit from a shingles vaccine.  Patient still being visited by the [place]. ? ?12/05/2021 ?Patient returns for 27-monthfollow-up with a video visit.  She had an eye exam recently and she has had decreased vision in the left eye she is concerned about this.  She was given Klonopin by the PA McClung 2 weeks ago she was only given 14 pills.  She also has hydroxyzine she states this does help.  She had an issue with Klonopin in the past would like to avoid further Klonopin. ? ?Patient did receive  azithromycin with a video visit with another provider for cough the cough now has resolved.  She is on 4 and half liters of oxygen today her saturation is 95%.  Blood pressure today 135/80.  Pulse is 100. ? ?Patient would like a new handicap sticker.  She needs refills on a variety of her medications.  She would like her hernia and the abdomen examined.  There are no other complaints at this time. ? ?Past Medical History:  ?Diagnosis Date  ? Arthritis   ? Asthma   ? Cancer ([facility]   ? Chronic respiratory failure with hypoxia and hypercapnia (HCC)   ? [place]   ? Hypertension   ? ? ?Past Surgical History:  ?Procedure Laterality Date  ? ABDOMINAL HYSTERECTOMY    ? TRACHEOSTOMY    ? reversed  ? TRACHEOSTOMY CLOSURE    ? ? ?Family History  ?Problem Relation Age of Onset  ? Hypertension Mother   ? Heart disease Mother   ? Cancer Mother   ? Asthma Father   ? Asthma Sister   ? Hypertension Sister   ? Asthma Brother   ? Cancer Brother   ? ? ?Social History  ? ?Socioeconomic History  ? Marital status: Single  ?  Spouse name: Not on file  ? Number of children: Not on file  ? Years of education: Not on file  ? Highest education level:  Not on file  ?Occupational History  ? Not on file  ?Tobacco Use  ? Smoking status: Former  ? Smokeless tobacco: Never  ? Tobacco comments:  ?  quit smoking in 2018    ?Vaping Use  ? Vaping Use: Never used  ?Substance and Sexual Activity  ? Alcohol use: Not Currently  ? Drug use: Not Currently  ? Sexual activity: Not on file  ?Other Topics Concern  ? Not on file  ?Social History Narrative  ? Not on file  ? ?Social Determinants of Health  ? ?Financial Resource Strain: Not on file  ?Food Insecurity: Not on file  ?Transportation Needs: Not on file  ?Physical Activity: Not on file  ?Stress: Not on file  ?Social Connections: Not on file  ?Intimate Partner Violence: Not on file  ? ? ?Outpatient Medications Prior to Visit  ?Medication Sig Dispense Refill  ?  albuterol (PROVENTIL) (2.5 MG/3ML) 0.083% nebulizer solution Take 3 mLs (2.5 mg total) by nebulization 3 (three) times daily. 270 mL 1  ? albuterol (VENTOLIN HFA) 108 (90 Base) MCG/ACT inhaler Inhale 2 puffs into the lungs every 6 (six) hours as needed for wheezing or shortness of breath. 8 g 2  ? amLODipine (NORVASC) 5 MG tablet TAKE 1 TABLET(5 MG) BY MOUTH DAILY 90 tablet 1  ? atorvastatin (LIPITOR) 10 MG tablet Take 1 tablet (10 mg total) by mouth daily. 90 tablet 3  ? azelastine (ASTELIN) 0.1 % nasal spray Place 2 sprays into both nostrils 2 (two) times daily. Use in each nostril as directed 30 mL 12  ? Blood Pressure Monitoring (BLOOD PRESSURE MONITOR AUTOMAT) DEVI Measure blood pressure and pulse daily 1 Device 0  ? Budeson-Glycopyrrol-Formoterol (BREZTRI AEROSPHERE) 160-9-4.8 MCG/ACT AERO Inhale 2 puffs into the lungs in the morning and at bedtime. 10.7 g 6  ? clonazePAM (KLONOPIN) 0.5 MG tablet Take 1 tablet (0.5 mg total) by mouth 2 (two) times daily as needed for up to 14 days for anxiety. COURTESY REFILL ONLY. 28 tablet 0  ? cyclobenzaprine (FLEXERIL) 10 MG tablet Take 1 tablet (10 mg total) by mouth 3 (three) times daily as needed for muscle spasms. 60 tablet 1  ? fluticasone (FLONASE) 50 MCG/ACT nasal spray Place 2 sprays into both nostrils daily. 16 g 6  ? gabapentin (NEURONTIN) 100 MG capsule TAKE 1 CAPSULE (100 MG TOTAL) BY MOUTH 3 (THREE) TIMES DAILY. (AM+NOON+BEDTIME) 90 capsule 3  ? hydrOXYzine (ATARAX) 25 MG tablet Take 1 tablet (25 mg total) by mouth 3 (three) times daily as needed for anxiety. 60 tablet 0  ? ibuprofen (IBU) 600 MG tablet TAKE 1 TABLET(600 MG) BY MOUTH EVERY 6 HOURS AS NEEDED 90 tablet 2  ? loratadine (ALLERGY RELIEF) 10 MG tablet TAKE ONE TABLET BY MOUTH ONCE DAILY (AM) 90 tablet 0  ? losartan (COZAAR) 100 MG tablet Take 1 tablet (100 mg total) by mouth daily. 90 tablet 2  ? Misc. Devices (PULSE OXIMETER FOR FINGER) MISC 1 application by Does not apply route daily as needed. 1  each 0  ? Misc. Devices MISC Nebulizer machine, Large shower chair.  Diagnosis- chronic respiratory failure 1 each 0  ? pantoprazole (PROTONIX) 40 MG tablet TAKE 1 TABLET BY MOUTH 2 (TWO) TIMES DAILY BEFORE A MEAL (AM+EVENING) 60 tablet 2  ? polyethylene glycol powder (GLYCOLAX/MIRALAX) 17 GM/SCOOP powder Take 17 g by mouth daily as needed. 507 g 1  ? promethazine-dextromethorphan (PROMETHAZINE-DM) 6.25-15 MG/5ML syrup Take 5 mLs by mouth 4 (four) times daily as  needed. 118 mL 0  ? Spacer/Aero-Holding Chambers (AEROCHAMBER MV) inhaler Use as instructed 1 each 0  ? VENTOLIN HFA 108 (90 Base) MCG/ACT inhaler INHALE TWO PUFFS BY MOUTH EVERY SIX HOURS AS NEEDED FOR WHEEZING OR SHORTNESS OF BREATH 18 g 0  ? Vitamin D, Ergocalciferol, (DRISDOL) 1.25 MG (50000 UNIT) CAPS capsule Take 1 capsule (50,000 Units total) by mouth every 7 (seven) days. 12 capsule 1  ? ?No facility-administered medications prior to visit.  ? ? ?No Known Allergies ? ?ROS ?Review of Systems  ?Constitutional: Negative.   ?HENT: Negative.  Negative for ear pain, postnasal drip, rhinorrhea, sinus pressure, sore throat, trouble swallowing and voice change.   ?Eyes:  Positive for visual disturbance.  ?Respiratory:  Positive for shortness of breath and wheezing. Negative for apnea, cough, choking, chest tightness and stridor.   ?Cardiovascular: Negative.  Negative for chest pain, palpitations and leg swelling.  ?Gastrointestinal: Negative.  Negative for abdominal distention, abdominal pain, constipation, diarrhea, nausea and vomiting.  ?Genitourinary: Negative.   ?Musculoskeletal: Negative.  Negative for arthralgias and myalgias.  ?Skin: Negative.  Negative for rash.  ?Allergic/Immunologic: Negative.  Negative for environmental allergies and food allergies.  ?Neurological: Negative.  Negative for dizziness, syncope, weakness and headaches.  ?Hematological: Negative.  Negative for adenopathy. Does not bruise/bleed easily.  ?Psychiatric/Behavioral: Negative.   Negative for agitation and sleep disturbance. The patient is not nervous/anxious.   ? ?  ?Objective:  ?  ?Physical Exam ?No exam this is a video visit patient is in no distress on video ?There were no vitals

## 2021-12-05 NOTE — Assessment & Plan Note (Signed)
Continue current oxygen therapy 

## 2021-12-05 NOTE — Assessment & Plan Note (Signed)
Prefer to avoid further Klonopin will use hydroxyzine refill same ?

## 2021-12-05 NOTE — Assessment & Plan Note (Signed)
Has new mobility wheelchair doing well with same ?

## 2021-12-05 NOTE — Assessment & Plan Note (Signed)
Continue current inhaled medications no changes made ?

## 2021-12-06 ENCOUNTER — Telehealth: Payer: Self-pay | Admitting: Critical Care Medicine

## 2021-12-06 NOTE — Telephone Encounter (Signed)
At visit yesterday pt said at eye doc she has vision loss left eye,  pls call daughter and ask her to get [place] a copy of eye report?  Or find out name of eye doctor so we can call for information  ?

## 2021-12-06 NOTE — Telephone Encounter (Signed)
I called and left a vm for her  ?

## 2021-12-08 ENCOUNTER — Encounter: Payer: Self-pay | Admitting: Critical Care Medicine

## 2021-12-13 ENCOUNTER — Telehealth: Payer: Self-pay | Admitting: Critical Care Medicine

## 2021-12-13 NOTE — Telephone Encounter (Signed)
Copied from [place] [phone]. Topic: General - Other ?>> Dec 13, 2021  4:12 PM Tessa Lerner A wrote: ?Reason for CRM: The patient has returned a missed call from the practice ? ?There were no open notes or encounters at the time of call with agent  ? ?Please contact further when available ?

## 2021-12-14 ENCOUNTER — Other Ambulatory Visit: Payer: Self-pay | Admitting: Critical Care Medicine

## 2021-12-14 DIAGNOSIS — U071 COVID-19: Secondary | ICD-10-CM

## 2021-12-14 NOTE — Telephone Encounter (Signed)
Requested Prescriptions  ?Pending Prescriptions Disp Refills  ?? albuterol (VENTOLIN HFA) 108 (90 Base) MCG/ACT inhaler [Pharmacy Med Name: ALBUTEROL SULFATE HFA 108 (90 BASE) MCG/ACT INHALATION AEROSOL SOLUTION] 8.5 g   ?  Sig: INHALE 2 PUFFS INTO THE LUNGS EVERY 6 (SIX) HOURS AS NEEDED FOR WHEEZING OR SHORTNESS OF BREATH.  ?  ? Pulmonology:  Beta Agonists 2 Passed - 12/14/2021  2:15 PM  ?  ?  Passed - Last BP in normal range  ?  BP Readings from Last 1 Encounters:  ?09/28/21 110/64  ?   ?  ?  Passed - Last Heart Rate in normal range  ?  Pulse Readings from Last 1 Encounters:  ?08/24/21 96  ?   ?  ?  Passed - Valid encounter within last 12 months  ?  Recent Outpatient Visits   ?      ? 1 week ago Chronic respiratory failure with hypoxia and hypercapnia ([place])  ? [place] Elsie Stain, MD  ? 1 month ago Generalized anxiety disorder  ? [place], [place] W, NP  ? 2 months ago Chronic respiratory failure with hypoxia and hypercapnia ([place])  ? [place] Elsie Stain, MD  ? 4 months ago Upper respiratory tract infection, unspecified type  ? [facility] at [facility], Kriste Basque, NP  ? 5 months ago Upper respiratory tract infection, unspecified type  ? Primary Care at [facility], Kriste Basque, NP  ?  ?  ? ?  ?  ?  ?? loratadine (CLARITIN) 10 MG tablet [Pharmacy Med Name: LORATADINE 10 MG ORAL TABLET] 90 tablet 0  ?  Sig: TAKE ONE TABLET BY MOUTH ONCE DAILY (AM)  ?  ? Ear, Nose, and Throat:  Antihistamines 2 Passed - 12/14/2021  2:15 PM  ?  ?  Passed - Cr in normal range and within 360 days  ?  Creatinine, Ser  ?Date Value Ref Range Status  ?06/28/2021 0.84 0.57 - 1.00 mg/dL Final  ?   ?  ?  Passed - Valid encounter within last 12 months  ?  Recent Outpatient Visits   ?      ? 1 week ago Chronic respiratory failure with hypoxia and hypercapnia ([place])  ? [place]  Elsie Stain, MD  ? 1 month ago Generalized anxiety disorder  ? [place], [place] W, NP  ? 2 months ago Chronic respiratory failure with hypoxia and hypercapnia ([place])  ? [place] Elsie Stain, MD  ? 4 months ago Upper respiratory tract infection, unspecified type  ? Primary Care at [facility], Kriste Basque, NP  ? 5 months ago Upper respiratory tract infection, unspecified type  ? Primary Care at [facility], Kriste Basque, NP  ?  ?  ? ?  ?  ?  ? ? ?

## 2021-12-16 DIAGNOSIS — J449 Chronic obstructive pulmonary disease, unspecified: Secondary | ICD-10-CM | POA: Diagnosis not present

## 2021-12-18 ENCOUNTER — Telehealth: Payer: Self-pay | Admitting: Critical Care Medicine

## 2021-12-18 DIAGNOSIS — Z1211 Encounter for screening for malignant neoplasm of colon: Secondary | ICD-10-CM

## 2021-12-18 NOTE — Telephone Encounter (Signed)
Pt was called about eye doctor info. Information has been uploaded through [employer] ?

## 2021-12-18 NOTE — Telephone Encounter (Signed)
Copied from [place] [id number]. Topic: Referral - Status ?>> Dec 18, 2021  9:47 AM Yvette Rack wrote: ?Reason for CRM: Pt requests a referral for colonoscopy. ?

## 2021-12-21 NOTE — Telephone Encounter (Signed)
Pt not a candidate for colonoscopy too high risk  due to lung disease ? ?I will order cologuard it is covered by insurance ?

## 2021-12-22 NOTE — Telephone Encounter (Signed)
Called pt an she is aware of doctors note ?

## 2022-01-02 DIAGNOSIS — J449 Chronic obstructive pulmonary disease, unspecified: Secondary | ICD-10-CM | POA: Diagnosis not present

## 2022-01-04 ENCOUNTER — Telehealth: Payer: Self-pay | Admitting: Critical Care Medicine

## 2022-01-04 DIAGNOSIS — J9610 Chronic respiratory failure, unspecified whether with hypoxia or hypercapnia: Secondary | ICD-10-CM | POA: Diagnosis not present

## 2022-01-04 DIAGNOSIS — Z7409 Other reduced mobility: Secondary | ICD-10-CM

## 2022-01-04 DIAGNOSIS — J418 Mixed simple and mucopurulent chronic bronchitis: Secondary | ICD-10-CM

## 2022-01-04 DIAGNOSIS — J449 Chronic obstructive pulmonary disease, unspecified: Secondary | ICD-10-CM | POA: Diagnosis not present

## 2022-01-04 NOTE — Telephone Encounter (Signed)
Copied from [place]. Topic: Referral - Status >> Jan 04, 2022 12:27 PM Yvette Rack wrote: Reason for CRM: Pt requests referral for in home physical therapy. Cb# [phone]

## 2022-01-05 NOTE — Telephone Encounter (Signed)
Fyi.

## 2022-01-08 NOTE — Telephone Encounter (Signed)
Bianca Murray, I thought she had received this in the past ?  Not sure needed again

## 2022-01-09 NOTE — Telephone Encounter (Signed)
Yes, she had home PT last fall 2022  I called her regarding this  request and she said her right leg has been bothering her due to the neuropathy and she would like some general exercises.  I told her that I would let Dr Joya Gaskins know.

## 2022-01-10 NOTE — Telephone Encounter (Signed)
Attempted to contact the patient # [phone] to inquire if she has a preference for home health agencies.  The recording stated that the call could not be completed at this time.

## 2022-01-10 NOTE — Telephone Encounter (Signed)
Yet another order issued it is on Therapist, sports

## 2022-01-10 NOTE — Addendum Note (Signed)
Addended by: Asencion Noble E on: 01/10/2022 12:19 PM   Modules accepted: Orders

## 2022-01-11 NOTE — Telephone Encounter (Signed)
I spoke to the patient regarding preference for home health agencies. She has used [facility] in the past and would prefer that agency if possible.  I told her that I will send the referral to [facility] and if they are not able to accept it, I would then contact other home health agencies.  She is in agreement with that plan.  I called [facility] # [phone],  spoke to [facility] and obtained fax # [phone].  Referral then faxed to [facility] for review.

## 2022-01-11 NOTE — Telephone Encounter (Signed)
Call received from [place] who said they are not able to accept the referral.  Referral faxed to [facility] for review.

## 2022-01-12 NOTE — Telephone Encounter (Signed)
Ronalee Belts w/ [facility] states they are unable to accept referral at this time due to clinical staffing level  He states based on pts insurance he would recommend sending a referral to [place] or [place]

## 2022-01-12 NOTE — Telephone Encounter (Signed)
Routing to pcp for review 

## 2022-01-15 NOTE — Telephone Encounter (Signed)
Noted  

## 2022-01-15 NOTE — Telephone Encounter (Signed)
Jane pls see message from home health and pls reroute my order, Carly in future pls route this to Opal Sidles

## 2022-01-15 NOTE — Telephone Encounter (Signed)
Call placed to [facility], spoke to [place] who requested the referral be faxed for review # [phone].  Referral then faxed as requested.

## 2022-01-16 ENCOUNTER — Telehealth: Payer: Self-pay

## 2022-01-16 NOTE — Telephone Encounter (Signed)
Message received from Tammy/ [facility] stating that they are not able to accept the referral

## 2022-01-16 NOTE — Telephone Encounter (Signed)
I spoke to [facility] who requested the referral be faxed for review.   It was then faxed as requested to [phone]

## 2022-01-18 NOTE — Telephone Encounter (Signed)
Call placed to [facility].  I spoke to [facility] who said that they accepted the referral and are scheduling the start of care for tomorrow.  I called patient and informed her of above information from [facility].

## 2022-01-19 ENCOUNTER — Ambulatory Visit: Payer: Self-pay | Admitting: *Deleted

## 2022-01-19 DIAGNOSIS — M1990 Unspecified osteoarthritis, unspecified site: Secondary | ICD-10-CM | POA: Diagnosis not present

## 2022-01-19 DIAGNOSIS — J418 Mixed simple and mucopurulent chronic bronchitis: Secondary | ICD-10-CM | POA: Diagnosis not present

## 2022-01-19 DIAGNOSIS — U071 COVID-19: Secondary | ICD-10-CM | POA: Diagnosis not present

## 2022-01-19 DIAGNOSIS — J432 Centrilobular emphysema: Secondary | ICD-10-CM | POA: Diagnosis not present

## 2022-01-19 DIAGNOSIS — J9611 Chronic respiratory failure with hypoxia: Secondary | ICD-10-CM | POA: Diagnosis not present

## 2022-01-19 DIAGNOSIS — I10 Essential (primary) hypertension: Secondary | ICD-10-CM | POA: Diagnosis not present

## 2022-01-19 DIAGNOSIS — H5462 Unqualified visual loss, left eye, normal vision right eye: Secondary | ICD-10-CM | POA: Diagnosis not present

## 2022-01-19 DIAGNOSIS — J9612 Chronic respiratory failure with hypercapnia: Secondary | ICD-10-CM | POA: Diagnosis not present

## 2022-01-19 DIAGNOSIS — J9600 Acute respiratory failure, unspecified whether with hypoxia or hypercapnia: Secondary | ICD-10-CM | POA: Diagnosis not present

## 2022-01-19 DIAGNOSIS — Z7409 Other reduced mobility: Secondary | ICD-10-CM | POA: Diagnosis not present

## 2022-01-19 DIAGNOSIS — Z9981 Dependence on supplemental oxygen: Secondary | ICD-10-CM | POA: Diagnosis not present

## 2022-01-19 NOTE — Telephone Encounter (Signed)
  Chief Complaint: PT calling in a report on pt's heart rate Symptoms: No symptoms Frequency: when ambulating HR goes up Pertinent Negatives: Patient denies per PT [name] no symptoms with elevated HR Disposition: '[]'$ ED /'[]'$ Urgent Care (no appt availability in office) / '[]'$ Appointment(In office/virtual)/ '[]'$  [place] [facility]/ '[]'$ Home Care/ '[]'$ Refused Recommended Disposition /'[]'$ [place] Mobile Bus/ '[x]'$  Follow-up with PCP Additional Notes:  [place] with [facility].  Physical therapist can be reached at [phone]

## 2022-01-19 NOTE — Telephone Encounter (Signed)
Reason for Disposition  Heart rhythm alert (e.g., "you have irregular heartbeat") from personal wearable device (e.g., Apple Watch)    Physical therapy called in HR asking to see if Dr. Joya Gaskins wants to change the parameters to when they need to call it in.  Answer Assessment - Initial Assessment Questions 1. DESCRIPTION: "Please describe your heart rate or heartbeat that you are having" (e.g., fast/slow, regular/irregular, skipped or extra beats, "palpitations")    [name] physical therapist with [facility] calling in.   Heart rate 106-110 at rest.   After walking went to 120.  Back to 106-110 resting again.   Does Dr. Joya Gaskins want to adjust her parameters?    We have to call any time HR over 100 BPM. 2. ONSET: "When did it start?" (Minutes, hours or days)      No chest pain.   Shortness of breath or symptoms when walking or when walking. 3. DURATION: "How long does it last" (e.g., seconds, minutes, hours)     HR up while ambulating and comes back down wth rest. 4. PATTERN "Does it come and go, or has it been constant since it started?"  "Does it get worse with exertion?"   "Are you feeling it now?"     Happens with exercise 5. TAP: "Using your hand, can you tap out what you are feeling on a chair or table in front of you, so that I can hear?" (Note: not all patients can do this)       N/A 6. HEART RATE: "Can you tell me your heart rate?" "How many beats in 15 seconds?"  (Note: not all patients can do this)       See above 7. RECURRENT SYMPTOM: "Have you ever had this before?" If Yes, ask: "When was the last time?" and "What happened that time?"      Not asked 8. CAUSE: "What do you think is causing the palpitations?"     No palpitations 9. CARDIAC HISTORY: "Do you have any history of heart disease?" (e.g., heart attack, angina, bypass surgery, angioplasty, arrhythmia)      Not asked 10. OTHER SYMPTOMS: "Do you have any other symptoms?" (e.g., dizziness, chest pain, sweating, difficulty  breathing)       No symptoms 11. PREGNANCY: "Is there any chance you are pregnant?" "When was your last menstrual period?"       N/A  Protocols used: Heart Rate and Heartbeat Questions-A-AH

## 2022-01-22 ENCOUNTER — Telehealth: Payer: Self-pay | Admitting: Critical Care Medicine

## 2022-01-22 NOTE — Telephone Encounter (Signed)
Called ms.Keryn from [place] and she is aware note

## 2022-01-22 NOTE — Telephone Encounter (Signed)
Can allow HR to go to 120  anything over cease PT and call Dr Joya Gaskins

## 2022-01-22 NOTE — Telephone Encounter (Signed)
Called and did verbal

## 2022-01-22 NOTE — Telephone Encounter (Signed)
Copied from [place] [phone]. Topic: Quick Communication - Home Health Verbal Orders >> Jan 19, 2022  3:31 PM Marcellus Scott wrote: Caller/Agency: [facility] Number: [phone] Requesting OT/PT/Skilled Nursing/Social Work/Speech Therapy:  PT,OT,Social Work Frequency: 2w2,1w2 every other week.   Pt needs medical social worker and OT for evaluation .

## 2022-01-24 DIAGNOSIS — Z7409 Other reduced mobility: Secondary | ICD-10-CM | POA: Diagnosis not present

## 2022-01-24 DIAGNOSIS — I10 Essential (primary) hypertension: Secondary | ICD-10-CM | POA: Diagnosis not present

## 2022-01-24 DIAGNOSIS — H5462 Unqualified visual loss, left eye, normal vision right eye: Secondary | ICD-10-CM | POA: Diagnosis not present

## 2022-01-24 DIAGNOSIS — J432 Centrilobular emphysema: Secondary | ICD-10-CM | POA: Diagnosis not present

## 2022-01-24 DIAGNOSIS — J418 Mixed simple and mucopurulent chronic bronchitis: Secondary | ICD-10-CM | POA: Diagnosis not present

## 2022-01-24 DIAGNOSIS — J9611 Chronic respiratory failure with hypoxia: Secondary | ICD-10-CM | POA: Diagnosis not present

## 2022-01-24 DIAGNOSIS — Z9981 Dependence on supplemental oxygen: Secondary | ICD-10-CM | POA: Diagnosis not present

## 2022-01-24 DIAGNOSIS — J9612 Chronic respiratory failure with hypercapnia: Secondary | ICD-10-CM | POA: Diagnosis not present

## 2022-01-24 DIAGNOSIS — M1990 Unspecified osteoarthritis, unspecified site: Secondary | ICD-10-CM | POA: Diagnosis not present

## 2022-01-24 DIAGNOSIS — J9600 Acute respiratory failure, unspecified whether with hypoxia or hypercapnia: Secondary | ICD-10-CM | POA: Diagnosis not present

## 2022-01-24 DIAGNOSIS — U071 COVID-19: Secondary | ICD-10-CM | POA: Diagnosis not present

## 2022-01-25 DIAGNOSIS — J9612 Chronic respiratory failure with hypercapnia: Secondary | ICD-10-CM | POA: Diagnosis not present

## 2022-01-25 DIAGNOSIS — Z9981 Dependence on supplemental oxygen: Secondary | ICD-10-CM | POA: Diagnosis not present

## 2022-01-25 DIAGNOSIS — Z7409 Other reduced mobility: Secondary | ICD-10-CM | POA: Diagnosis not present

## 2022-01-25 DIAGNOSIS — J432 Centrilobular emphysema: Secondary | ICD-10-CM | POA: Diagnosis not present

## 2022-01-25 DIAGNOSIS — H5462 Unqualified visual loss, left eye, normal vision right eye: Secondary | ICD-10-CM | POA: Diagnosis not present

## 2022-01-25 DIAGNOSIS — U071 COVID-19: Secondary | ICD-10-CM | POA: Diagnosis not present

## 2022-01-25 DIAGNOSIS — M1990 Unspecified osteoarthritis, unspecified site: Secondary | ICD-10-CM | POA: Diagnosis not present

## 2022-01-25 DIAGNOSIS — J9600 Acute respiratory failure, unspecified whether with hypoxia or hypercapnia: Secondary | ICD-10-CM | POA: Diagnosis not present

## 2022-01-25 DIAGNOSIS — J418 Mixed simple and mucopurulent chronic bronchitis: Secondary | ICD-10-CM | POA: Diagnosis not present

## 2022-01-25 DIAGNOSIS — I10 Essential (primary) hypertension: Secondary | ICD-10-CM | POA: Diagnosis not present

## 2022-01-25 DIAGNOSIS — J9611 Chronic respiratory failure with hypoxia: Secondary | ICD-10-CM | POA: Diagnosis not present

## 2022-01-26 DIAGNOSIS — Z7409 Other reduced mobility: Secondary | ICD-10-CM | POA: Diagnosis not present

## 2022-01-26 DIAGNOSIS — U071 COVID-19: Secondary | ICD-10-CM | POA: Diagnosis not present

## 2022-01-26 DIAGNOSIS — M1990 Unspecified osteoarthritis, unspecified site: Secondary | ICD-10-CM | POA: Diagnosis not present

## 2022-01-26 DIAGNOSIS — J418 Mixed simple and mucopurulent chronic bronchitis: Secondary | ICD-10-CM | POA: Diagnosis not present

## 2022-01-26 DIAGNOSIS — H5462 Unqualified visual loss, left eye, normal vision right eye: Secondary | ICD-10-CM | POA: Diagnosis not present

## 2022-01-26 DIAGNOSIS — I10 Essential (primary) hypertension: Secondary | ICD-10-CM | POA: Diagnosis not present

## 2022-01-26 DIAGNOSIS — J9611 Chronic respiratory failure with hypoxia: Secondary | ICD-10-CM | POA: Diagnosis not present

## 2022-01-26 DIAGNOSIS — Z9981 Dependence on supplemental oxygen: Secondary | ICD-10-CM | POA: Diagnosis not present

## 2022-01-26 DIAGNOSIS — J9612 Chronic respiratory failure with hypercapnia: Secondary | ICD-10-CM | POA: Diagnosis not present

## 2022-01-26 DIAGNOSIS — J432 Centrilobular emphysema: Secondary | ICD-10-CM | POA: Diagnosis not present

## 2022-01-26 DIAGNOSIS — J9600 Acute respiratory failure, unspecified whether with hypoxia or hypercapnia: Secondary | ICD-10-CM | POA: Diagnosis not present

## 2022-01-29 DIAGNOSIS — J449 Chronic obstructive pulmonary disease, unspecified: Secondary | ICD-10-CM | POA: Diagnosis not present

## 2022-01-30 ENCOUNTER — Telehealth: Payer: Self-pay | Admitting: Critical Care Medicine

## 2022-01-30 DIAGNOSIS — I10 Essential (primary) hypertension: Secondary | ICD-10-CM | POA: Diagnosis not present

## 2022-01-30 DIAGNOSIS — J9612 Chronic respiratory failure with hypercapnia: Secondary | ICD-10-CM | POA: Diagnosis not present

## 2022-01-30 DIAGNOSIS — J9600 Acute respiratory failure, unspecified whether with hypoxia or hypercapnia: Secondary | ICD-10-CM | POA: Diagnosis not present

## 2022-01-30 DIAGNOSIS — H5462 Unqualified visual loss, left eye, normal vision right eye: Secondary | ICD-10-CM | POA: Diagnosis not present

## 2022-01-30 DIAGNOSIS — U071 COVID-19: Secondary | ICD-10-CM | POA: Diagnosis not present

## 2022-01-30 DIAGNOSIS — M1990 Unspecified osteoarthritis, unspecified site: Secondary | ICD-10-CM | POA: Diagnosis not present

## 2022-01-30 DIAGNOSIS — Z9981 Dependence on supplemental oxygen: Secondary | ICD-10-CM | POA: Diagnosis not present

## 2022-01-30 DIAGNOSIS — J418 Mixed simple and mucopurulent chronic bronchitis: Secondary | ICD-10-CM | POA: Diagnosis not present

## 2022-01-30 DIAGNOSIS — J432 Centrilobular emphysema: Secondary | ICD-10-CM | POA: Diagnosis not present

## 2022-01-30 DIAGNOSIS — Z7409 Other reduced mobility: Secondary | ICD-10-CM | POA: Diagnosis not present

## 2022-01-30 DIAGNOSIS — J9611 Chronic respiratory failure with hypoxia: Secondary | ICD-10-CM | POA: Diagnosis not present

## 2022-01-30 NOTE — Telephone Encounter (Signed)
Can you help with this please.

## 2022-01-30 NOTE — Telephone Encounter (Signed)
Copied from [place] [id number]. Topic: General - Other >> Jan 30, 2022  9:49 AM Cyndi Bender wrote: Reason for CRM: Pt stated she needs Dr.Wright to assist her with placement in to an assistant living facility. Pt requests call back asap

## 2022-01-31 ENCOUNTER — Other Ambulatory Visit: Payer: Self-pay | Admitting: Critical Care Medicine

## 2022-01-31 NOTE — Telephone Encounter (Signed)
Call returned to patient.  She is not interested in Assisted Living because she does not want to relinquish her monthly check to the facility.  She said it is in her best interest to leave her current housing situation and move into a place of her own. She wants an independent living apartment.  She explained that Salena Saner, EMT gave her list of apartments awhile ago and she will start to call places on the list.  I gave her the phone number for [place] [facility] and instructed her to call and request to speak with a community caseworker. They may be able to provide some extra guidance as she considers her housing options.   She said she wants Dr Joya Gaskins to know her plan I told her I would let him know and I instructed her to call me back with any questions.

## 2022-02-02 DIAGNOSIS — J418 Mixed simple and mucopurulent chronic bronchitis: Secondary | ICD-10-CM | POA: Diagnosis not present

## 2022-02-02 DIAGNOSIS — Z7409 Other reduced mobility: Secondary | ICD-10-CM | POA: Diagnosis not present

## 2022-02-02 DIAGNOSIS — J9611 Chronic respiratory failure with hypoxia: Secondary | ICD-10-CM | POA: Diagnosis not present

## 2022-02-02 DIAGNOSIS — I10 Essential (primary) hypertension: Secondary | ICD-10-CM | POA: Diagnosis not present

## 2022-02-02 DIAGNOSIS — J9612 Chronic respiratory failure with hypercapnia: Secondary | ICD-10-CM | POA: Diagnosis not present

## 2022-02-02 DIAGNOSIS — J432 Centrilobular emphysema: Secondary | ICD-10-CM | POA: Diagnosis not present

## 2022-02-02 DIAGNOSIS — M1990 Unspecified osteoarthritis, unspecified site: Secondary | ICD-10-CM | POA: Diagnosis not present

## 2022-02-02 DIAGNOSIS — Z9981 Dependence on supplemental oxygen: Secondary | ICD-10-CM | POA: Diagnosis not present

## 2022-02-02 DIAGNOSIS — J9600 Acute respiratory failure, unspecified whether with hypoxia or hypercapnia: Secondary | ICD-10-CM | POA: Diagnosis not present

## 2022-02-02 DIAGNOSIS — U071 COVID-19: Secondary | ICD-10-CM | POA: Diagnosis not present

## 2022-02-02 DIAGNOSIS — J449 Chronic obstructive pulmonary disease, unspecified: Secondary | ICD-10-CM | POA: Diagnosis not present

## 2022-02-02 DIAGNOSIS — H5462 Unqualified visual loss, left eye, normal vision right eye: Secondary | ICD-10-CM | POA: Diagnosis not present

## 2022-02-04 DIAGNOSIS — J449 Chronic obstructive pulmonary disease, unspecified: Secondary | ICD-10-CM | POA: Diagnosis not present

## 2022-02-04 DIAGNOSIS — J9610 Chronic respiratory failure, unspecified whether with hypoxia or hypercapnia: Secondary | ICD-10-CM | POA: Diagnosis not present

## 2022-02-05 ENCOUNTER — Other Ambulatory Visit: Payer: Self-pay | Admitting: Critical Care Medicine

## 2022-02-07 DIAGNOSIS — J418 Mixed simple and mucopurulent chronic bronchitis: Secondary | ICD-10-CM | POA: Diagnosis not present

## 2022-02-07 DIAGNOSIS — J9611 Chronic respiratory failure with hypoxia: Secondary | ICD-10-CM | POA: Diagnosis not present

## 2022-02-07 DIAGNOSIS — H5462 Unqualified visual loss, left eye, normal vision right eye: Secondary | ICD-10-CM | POA: Diagnosis not present

## 2022-02-07 DIAGNOSIS — M1990 Unspecified osteoarthritis, unspecified site: Secondary | ICD-10-CM | POA: Diagnosis not present

## 2022-02-07 DIAGNOSIS — Z9981 Dependence on supplemental oxygen: Secondary | ICD-10-CM | POA: Diagnosis not present

## 2022-02-07 DIAGNOSIS — J432 Centrilobular emphysema: Secondary | ICD-10-CM | POA: Diagnosis not present

## 2022-02-07 DIAGNOSIS — J9612 Chronic respiratory failure with hypercapnia: Secondary | ICD-10-CM | POA: Diagnosis not present

## 2022-02-07 DIAGNOSIS — Z7409 Other reduced mobility: Secondary | ICD-10-CM | POA: Diagnosis not present

## 2022-02-07 DIAGNOSIS — J9600 Acute respiratory failure, unspecified whether with hypoxia or hypercapnia: Secondary | ICD-10-CM | POA: Diagnosis not present

## 2022-02-07 DIAGNOSIS — I10 Essential (primary) hypertension: Secondary | ICD-10-CM | POA: Diagnosis not present

## 2022-02-07 DIAGNOSIS — U071 COVID-19: Secondary | ICD-10-CM | POA: Diagnosis not present

## 2022-02-14 DIAGNOSIS — J432 Centrilobular emphysema: Secondary | ICD-10-CM | POA: Diagnosis not present

## 2022-02-14 DIAGNOSIS — J9612 Chronic respiratory failure with hypercapnia: Secondary | ICD-10-CM | POA: Diagnosis not present

## 2022-02-14 DIAGNOSIS — J9611 Chronic respiratory failure with hypoxia: Secondary | ICD-10-CM | POA: Diagnosis not present

## 2022-02-14 DIAGNOSIS — M1990 Unspecified osteoarthritis, unspecified site: Secondary | ICD-10-CM | POA: Diagnosis not present

## 2022-02-14 DIAGNOSIS — I10 Essential (primary) hypertension: Secondary | ICD-10-CM | POA: Diagnosis not present

## 2022-02-14 DIAGNOSIS — Z9981 Dependence on supplemental oxygen: Secondary | ICD-10-CM | POA: Diagnosis not present

## 2022-02-14 DIAGNOSIS — U071 COVID-19: Secondary | ICD-10-CM | POA: Diagnosis not present

## 2022-02-14 DIAGNOSIS — J9600 Acute respiratory failure, unspecified whether with hypoxia or hypercapnia: Secondary | ICD-10-CM | POA: Diagnosis not present

## 2022-02-14 DIAGNOSIS — Z7409 Other reduced mobility: Secondary | ICD-10-CM | POA: Diagnosis not present

## 2022-02-14 DIAGNOSIS — H5462 Unqualified visual loss, left eye, normal vision right eye: Secondary | ICD-10-CM | POA: Diagnosis not present

## 2022-02-14 DIAGNOSIS — J418 Mixed simple and mucopurulent chronic bronchitis: Secondary | ICD-10-CM | POA: Diagnosis not present

## 2022-02-19 ENCOUNTER — Telehealth: Payer: Self-pay | Admitting: Critical Care Medicine

## 2022-02-19 NOTE — Telephone Encounter (Signed)
Copied from [place] [phone]. Topic: Quick Communication - Home Health Verbal Orders >> Feb 14, 2022  2:48 PM Marcellus Scott wrote: Caller/Agency: [facility] Number: [phone]  Requesting OT/PT/Skilled Nursing/Social Work/Speech Therapy: Social Work Frequency: N/A  [place] stated pt cannot be seen pt this week visit needs to be moved next week.

## 2022-02-19 NOTE — Telephone Encounter (Signed)
Currie Paris, SW, requested order to go to patient's home for evaluation.   VO given.  Verbalized understanding.

## 2022-02-28 DIAGNOSIS — J432 Centrilobular emphysema: Secondary | ICD-10-CM | POA: Diagnosis not present

## 2022-02-28 DIAGNOSIS — J9611 Chronic respiratory failure with hypoxia: Secondary | ICD-10-CM | POA: Diagnosis not present

## 2022-02-28 DIAGNOSIS — U071 COVID-19: Secondary | ICD-10-CM | POA: Diagnosis not present

## 2022-02-28 DIAGNOSIS — J9600 Acute respiratory failure, unspecified whether with hypoxia or hypercapnia: Secondary | ICD-10-CM | POA: Diagnosis not present

## 2022-02-28 DIAGNOSIS — M1990 Unspecified osteoarthritis, unspecified site: Secondary | ICD-10-CM | POA: Diagnosis not present

## 2022-02-28 DIAGNOSIS — J418 Mixed simple and mucopurulent chronic bronchitis: Secondary | ICD-10-CM | POA: Diagnosis not present

## 2022-02-28 DIAGNOSIS — Z9981 Dependence on supplemental oxygen: Secondary | ICD-10-CM | POA: Diagnosis not present

## 2022-02-28 DIAGNOSIS — H5462 Unqualified visual loss, left eye, normal vision right eye: Secondary | ICD-10-CM | POA: Diagnosis not present

## 2022-02-28 DIAGNOSIS — J449 Chronic obstructive pulmonary disease, unspecified: Secondary | ICD-10-CM | POA: Diagnosis not present

## 2022-02-28 DIAGNOSIS — I10 Essential (primary) hypertension: Secondary | ICD-10-CM | POA: Diagnosis not present

## 2022-02-28 DIAGNOSIS — J9612 Chronic respiratory failure with hypercapnia: Secondary | ICD-10-CM | POA: Diagnosis not present

## 2022-02-28 DIAGNOSIS — Z7409 Other reduced mobility: Secondary | ICD-10-CM | POA: Diagnosis not present

## 2022-03-04 DIAGNOSIS — J449 Chronic obstructive pulmonary disease, unspecified: Secondary | ICD-10-CM | POA: Diagnosis not present

## 2022-03-06 DIAGNOSIS — J449 Chronic obstructive pulmonary disease, unspecified: Secondary | ICD-10-CM | POA: Diagnosis not present

## 2022-03-06 DIAGNOSIS — J9610 Chronic respiratory failure, unspecified whether with hypoxia or hypercapnia: Secondary | ICD-10-CM | POA: Diagnosis not present

## 2022-03-07 ENCOUNTER — Ambulatory Visit: Payer: Self-pay

## 2022-03-07 NOTE — Telephone Encounter (Signed)
    Chief Complaint: BP 141/91, SOB with exertion. Uses O2 at home, has COPD. O2 sat 95%. Having chills and headache. Requesting VV today, no availability. Symptoms: Above Frequency: Monday Pertinent Negatives: Patient denies any other symptoms. Disposition: '[]'$ ED /'[]'$ Urgent Care (no appt availability in office) / '[]'$ Appointment(In office/virtual)/ '[]'$  [place] [facility]/ '[]'$ Home Care/ '[]'$ Refused Recommended Disposition /'[]'$ [place] Mobile [facility]/ '[x]'$  Follow-up with PCP Additional Notes: Please advise pt.  Answer Assessment - Initial Assessment Questions 1. RESPIRATORY STATUS: "Describe your breathing?" (e.g., wheezing, shortness of breath, unable to speak, severe coughing)      SOB with exertion 2. ONSET: "When did this breathing problem begin?"      Monday 3. PATTERN "Does the difficult breathing come and go, or has it been constant since it started?"      Comes and goes 4. SEVERITY: "How bad is your breathing?" (e.g., mild, moderate, severe)    - MILD: No SOB at rest, mild SOB with walking, speaks normally in sentences, can lie down, no retractions, pulse < 100.    - MODERATE: SOB at rest, SOB with minimal exertion and prefers to sit, cannot lie down flat, speaks in phrases, mild retractions, audible wheezing, pulse 100-120.    - SEVERE: Very SOB at rest, speaks in single words, struggling to breathe, sitting hunched forward, retractions, pulse > 120      Mild 5. RECURRENT SYMPTOM: "Have you had difficulty breathing before?" If Yes, ask: "When was the last time?" and "What happened that time?"      Yes 6. CARDIAC HISTORY: "Do you have any history of heart disease?" (e.g., heart attack, angina, bypass surgery, angioplasty)      Yes 7. LUNG HISTORY: "Do you have any history of lung disease?"  (e.g., pulmonary embolus, asthma, emphysema)     COPD 8. CAUSE: "What do you think is causing the breathing problem?"      Unsure 9. OTHER SYMPTOMS: "Do you have any other symptoms? (e.g., dizziness,  runny nose, cough, chest pain, fever)     Headache, chills 10. O2 SATURATION MONITOR:  "Do you use an oxygen saturation monitor (pulse oximeter) at home?" If Yes, ask: "What is your reading (oxygen level) today?" "What is your usual oxygen saturation reading?" (e.g., 95%)       Yes 11. PREGNANCY: "Is there any chance you are pregnant?" "When was your last menstrual period?"       No 12. TRAVEL: "Have you traveled out of the country in the last month?" (e.g., travel history, exposures)       No  Protocols used: Breathing Difficulty-A-AH

## 2022-03-08 NOTE — Telephone Encounter (Signed)
Pls advise.   She c/o headache. States she checked oxygen tank to see if it was too high.   She states her blood pressures are normal. SBP 135 and is taking medication as directed.   She states her Covid 19 test was negative.   Advise Mobile Unit operations. She declined. Advised [facility] does not operated on Fridays. Other options would be UC or ED.

## 2022-03-09 ENCOUNTER — Other Ambulatory Visit: Payer: Self-pay | Admitting: Critical Care Medicine

## 2022-03-09 NOTE — Telephone Encounter (Signed)
Called patient back- states she still has a headache.  Notified that Dr. Joya Gaskins sent claritin Rx to pharmacy today. She states she does not think that will help.   Tylenol and Excedrin Migraine does not help.  She drinks coffee everyday except this week.  She states she has not had coffee this week.   Advised patient headache could be withdrawal from caffeine. She states she will drink a cup and see how she feels.  Denies - unsteady gait, BUE weakness, facial asymmetry, slurred speech.   Advised if sx's do not improve or worsen to seek further medical help for evaluation.   Patient verbalized understanding.

## 2022-03-09 NOTE — Telephone Encounter (Signed)
Pt. Calling back. States "Bianca Murray was going to talk to Dr. Joya Gaskins and call me back. I told her I can't go out to the UC, I need to talk to him. I also have a bad headache. She hasn't called me back." Please advise pt.

## 2022-03-16 ENCOUNTER — Telehealth: Payer: Self-pay | Admitting: Critical Care Medicine

## 2022-03-16 DIAGNOSIS — J9612 Chronic respiratory failure with hypercapnia: Secondary | ICD-10-CM | POA: Diagnosis not present

## 2022-03-16 DIAGNOSIS — J432 Centrilobular emphysema: Secondary | ICD-10-CM | POA: Diagnosis not present

## 2022-03-16 DIAGNOSIS — H5462 Unqualified visual loss, left eye, normal vision right eye: Secondary | ICD-10-CM | POA: Diagnosis not present

## 2022-03-16 DIAGNOSIS — Z8616 Personal history of COVID-19: Secondary | ICD-10-CM | POA: Diagnosis not present

## 2022-03-16 DIAGNOSIS — Z9981 Dependence on supplemental oxygen: Secondary | ICD-10-CM | POA: Diagnosis not present

## 2022-03-16 DIAGNOSIS — J418 Mixed simple and mucopurulent chronic bronchitis: Secondary | ICD-10-CM | POA: Diagnosis not present

## 2022-03-16 DIAGNOSIS — Z7409 Other reduced mobility: Secondary | ICD-10-CM | POA: Diagnosis not present

## 2022-03-16 DIAGNOSIS — I10 Essential (primary) hypertension: Secondary | ICD-10-CM | POA: Diagnosis not present

## 2022-03-16 DIAGNOSIS — J9611 Chronic respiratory failure with hypoxia: Secondary | ICD-10-CM | POA: Diagnosis not present

## 2022-03-16 DIAGNOSIS — M1990 Unspecified osteoarthritis, unspecified site: Secondary | ICD-10-CM | POA: Diagnosis not present

## 2022-03-16 NOTE — Telephone Encounter (Signed)
Verbal orders given for PT/ OT to Mickel Baas.

## 2022-03-16 NOTE — Telephone Encounter (Signed)
Home Health Verbal Orders - Caller/Agency: [facility]ri[facility] Number: [phone]/ vm can be left Requesting PT Frequency: every other week for 8 weeks and OT Eval for bathing

## 2022-03-22 DIAGNOSIS — H5462 Unqualified visual loss, left eye, normal vision right eye: Secondary | ICD-10-CM | POA: Diagnosis not present

## 2022-03-22 DIAGNOSIS — Z8616 Personal history of COVID-19: Secondary | ICD-10-CM | POA: Diagnosis not present

## 2022-03-22 DIAGNOSIS — J418 Mixed simple and mucopurulent chronic bronchitis: Secondary | ICD-10-CM | POA: Diagnosis not present

## 2022-03-22 DIAGNOSIS — I10 Essential (primary) hypertension: Secondary | ICD-10-CM | POA: Diagnosis not present

## 2022-03-22 DIAGNOSIS — J432 Centrilobular emphysema: Secondary | ICD-10-CM | POA: Diagnosis not present

## 2022-03-22 DIAGNOSIS — Z7409 Other reduced mobility: Secondary | ICD-10-CM | POA: Diagnosis not present

## 2022-03-22 DIAGNOSIS — J9611 Chronic respiratory failure with hypoxia: Secondary | ICD-10-CM | POA: Diagnosis not present

## 2022-03-22 DIAGNOSIS — J9612 Chronic respiratory failure with hypercapnia: Secondary | ICD-10-CM | POA: Diagnosis not present

## 2022-03-22 DIAGNOSIS — Z9981 Dependence on supplemental oxygen: Secondary | ICD-10-CM | POA: Diagnosis not present

## 2022-03-22 DIAGNOSIS — M1990 Unspecified osteoarthritis, unspecified site: Secondary | ICD-10-CM | POA: Diagnosis not present

## 2022-03-30 DIAGNOSIS — J449 Chronic obstructive pulmonary disease, unspecified: Secondary | ICD-10-CM | POA: Diagnosis not present

## 2022-04-02 DIAGNOSIS — Z8616 Personal history of COVID-19: Secondary | ICD-10-CM | POA: Diagnosis not present

## 2022-04-02 DIAGNOSIS — J418 Mixed simple and mucopurulent chronic bronchitis: Secondary | ICD-10-CM | POA: Diagnosis not present

## 2022-04-02 DIAGNOSIS — I10 Essential (primary) hypertension: Secondary | ICD-10-CM | POA: Diagnosis not present

## 2022-04-02 DIAGNOSIS — J432 Centrilobular emphysema: Secondary | ICD-10-CM | POA: Diagnosis not present

## 2022-04-02 DIAGNOSIS — J9611 Chronic respiratory failure with hypoxia: Secondary | ICD-10-CM | POA: Diagnosis not present

## 2022-04-02 DIAGNOSIS — Z9981 Dependence on supplemental oxygen: Secondary | ICD-10-CM | POA: Diagnosis not present

## 2022-04-02 DIAGNOSIS — Z7409 Other reduced mobility: Secondary | ICD-10-CM | POA: Diagnosis not present

## 2022-04-02 DIAGNOSIS — M1990 Unspecified osteoarthritis, unspecified site: Secondary | ICD-10-CM | POA: Diagnosis not present

## 2022-04-02 DIAGNOSIS — J9612 Chronic respiratory failure with hypercapnia: Secondary | ICD-10-CM | POA: Diagnosis not present

## 2022-04-02 DIAGNOSIS — H5462 Unqualified visual loss, left eye, normal vision right eye: Secondary | ICD-10-CM | POA: Diagnosis not present

## 2022-04-03 ENCOUNTER — Ambulatory Visit: Payer: Self-pay

## 2022-04-03 NOTE — Telephone Encounter (Signed)
  Chief Complaint: abd pain , pushing against navel Symptoms: distended abdomen, abdomen "tight:, itching at site, severe pain Frequency: 1 week ago Pertinent Negatives: Patient denies vomiting, back pain, urination pain, fever, diarrhea Disposition: '[x]'$ ED /'[]'$ Urgent Care (no appt availability in office) / '[]'$ Appointment(In office/virtual)/ '[]'$  [place] [facility]/ '[]'$ Home Care/ '[]'$ Refused Recommended Disposition /'[]'$  Mobile Bus/ '[]'$  Follow-up with PCP Additional Notes: Care advice given - pt to ask sister to take her to [facility] ED- advised pt if unable to get transportation, to call 911 Reason for Disposition  [1] SEVERE pain AND [2] age > 60 years  Answer Assessment - Initial Assessment Questions 1. LOCATION: "Where does it hurt?"      Near navel tender to outside 2. RADIATION: "Does the pain shoot anywhere else?" (e.g., chest, back)     Stomach tight, distended 3. ONSET: "When did the pain begin?" (e.g., minutes, hours or days ago)      1 week ago started itching- started to get bigger  4. SUDDEN: "Gradual or sudden onset?"     gradually 5. PATTERN "Does the pain come and go, or is it constant?"    - If it comes and goes: "How long does it last?" "Do you have pain now?"     (Note: Comes and goes means the pain is intermittent. It goes away completely between bouts.)    - If constant: "Is it getting better, staying the same, or getting worse?"      (Note: Constant means the pain never goes away completely; most serious pain is constant and gets worse.)      Comes and goes 6. SEVERITY: "How bad is the pain?"  (e.g., Scale 1-10; mild, moderate, or severe)    - MILD (1-3): Doesn't interfere with normal activities, abdomen soft and not tender to touch.     - MODERATE (4-7): Interferes with normal activities or awakens from sleep, abdomen tender to touch.     - SEVERE (8-10): Excruciating pain, doubled over, unable to do any normal activities.       Severe 8/10 7. RECURRENT SYMPTOM:  "Have you ever had this type of stomach pain before?" If Yes, ask: "When was the last time?" and "What happened that time?"      no 8. CAUSE: "What do you think is causing the stomach pain?"     hernia 9. RELIEVING/AGGRAVATING FACTORS: "What makes it better or worse?" (e.g., antacids, bending or twisting motion, bowel movement)     Moving in bed- cant twist 10. OTHER SYMPTOMS: "Do you have any other symptoms?" (e.g., back pain, diarrhea, fever, urination pain, vomiting)       No- 11. PREGNANCY: "Is there any chance you are pregnant?" "When was your last menstrual period?"       N/a  Protocols used: Abdominal Pain - [facility]

## 2022-04-04 DIAGNOSIS — J449 Chronic obstructive pulmonary disease, unspecified: Secondary | ICD-10-CM | POA: Diagnosis not present

## 2022-04-04 NOTE — Telephone Encounter (Signed)
Pt was called and given walk in appointment for tomorrow morning.

## 2022-04-05 ENCOUNTER — Ambulatory Visit: Payer: Medicare Other

## 2022-04-05 ENCOUNTER — Other Ambulatory Visit: Payer: Self-pay | Admitting: Critical Care Medicine

## 2022-04-05 DIAGNOSIS — J9611 Chronic respiratory failure with hypoxia: Secondary | ICD-10-CM | POA: Diagnosis not present

## 2022-04-05 DIAGNOSIS — H5462 Unqualified visual loss, left eye, normal vision right eye: Secondary | ICD-10-CM | POA: Diagnosis not present

## 2022-04-05 DIAGNOSIS — J418 Mixed simple and mucopurulent chronic bronchitis: Secondary | ICD-10-CM | POA: Diagnosis not present

## 2022-04-05 DIAGNOSIS — M1990 Unspecified osteoarthritis, unspecified site: Secondary | ICD-10-CM | POA: Diagnosis not present

## 2022-04-05 DIAGNOSIS — J9612 Chronic respiratory failure with hypercapnia: Secondary | ICD-10-CM | POA: Diagnosis not present

## 2022-04-05 DIAGNOSIS — Z9981 Dependence on supplemental oxygen: Secondary | ICD-10-CM | POA: Diagnosis not present

## 2022-04-05 DIAGNOSIS — Z8616 Personal history of COVID-19: Secondary | ICD-10-CM | POA: Diagnosis not present

## 2022-04-05 DIAGNOSIS — U071 COVID-19: Secondary | ICD-10-CM

## 2022-04-05 DIAGNOSIS — I10 Essential (primary) hypertension: Secondary | ICD-10-CM | POA: Diagnosis not present

## 2022-04-05 DIAGNOSIS — Z7409 Other reduced mobility: Secondary | ICD-10-CM | POA: Diagnosis not present

## 2022-04-05 DIAGNOSIS — J432 Centrilobular emphysema: Secondary | ICD-10-CM | POA: Diagnosis not present

## 2022-04-05 NOTE — Telephone Encounter (Signed)
Requested medication (s) are due for refill today: yes  Requested medication (s) are on the active medication list: yes  Last refill:  02/06/22  Future visit scheduled:no  Notes to clinic:  Unable to refill per protocol, cannot delegate. Routing for review.     Requested Prescriptions  Pending Prescriptions Disp Refills   cyclobenzaprine (FLEXERIL) 10 MG tablet [Pharmacy Med Name: CYCLOBENZAPRINE HCL 10 MG ORAL TABLET] 60 tablet 1    Sig: TAKE 1 TABLET (10 MG TOTAL) BY MOUTH 3 (THREE) TIMES DAILY AS NEEDED FOR MUSCLE SPASMS.     Not Delegated - Analgesics:  Muscle Relaxants Failed - 04/05/2022  9:55 AM      Failed - This refill cannot be delegated      Passed - Valid encounter within last 6 months    Recent Outpatient Visits           4 months ago Chronic respiratory failure with hypoxia and hypercapnia ([place])   [place] Elsie Stain, MD   4 months ago Generalized anxiety disorder   [place], [place] W, NP   6 months ago Chronic respiratory failure with hypoxia and hypercapnia [facility])   [place] Elsie Stain, MD   8 months ago Upper respiratory tract infection, unspecified type   Primary Care at [facility], Kriste Basque, NP   8 months ago Upper respiratory tract infection, unspecified type   Primary Care at [facility], Kriste Basque, NP               albuterol (VENTOLIN HFA) 108 (90 Base) MCG/ACT inhaler [Pharmacy Med Name: ALBUTEROL SULFATE HFA 108 (90 BASE) MCG/ACT INHALATION AEROSOL SOLUTION] 8.5 g 2    Sig: INHALE 2 PUFFS INTO THE LUNGS EVERY 6 (SIX) HOURS AS NEEDED FOR WHEEZING OR SHORTNESS OF BREATH.     Pulmonology:  Beta Agonists 2 Passed - 04/05/2022  9:55 AM      Passed - Last BP in normal range    BP Readings from Last 1 Encounters:  09/28/21 110/64         Passed - Last Heart Rate in normal range    Pulse Readings from Last 1  Encounters:  08/24/21 96         Passed - Valid encounter within last 12 months    Recent Outpatient Visits           4 months ago Chronic respiratory failure with hypoxia and hypercapnia ([place])   [place] Elsie Stain, MD   4 months ago Generalized anxiety disorder   [place], [place] W, NP   6 months ago Chronic respiratory failure with hypoxia and hypercapnia [facility])   [place] Elsie Stain, MD   8 months ago Upper respiratory tract infection, unspecified type   Primary Care at [facility], Kriste Basque, NP   8 months ago Upper respiratory tract infection, unspecified type   Primary Care at [facility], Kriste Basque, NP               hydrOXYzine (ATARAX) 25 MG tablet [Pharmacy Med Name: HYDROXYZINE HCL 25 MG ORAL TABLET] 60 tablet 3    Sig: Take 1 tablet (25 mg total) by mouth 3 (three) times daily as needed for anxiety.     Ear, Nose, and Throat:  Antihistamines 2 Passed -  04/05/2022  9:55 AM      Passed - Cr in normal range and within 360 days    Creatinine, Ser  Date Value Ref Range Status  06/28/2021 0.84 0.57 - 1.00 mg/dL Final         Passed - Valid encounter within last 12 months    Recent Outpatient Visits           4 months ago Chronic respiratory failure with hypoxia and hypercapnia ([place])   [place] Elsie Stain, MD   4 months ago Generalized anxiety disorder   [place], [place] W, NP   6 months ago Chronic respiratory failure with hypoxia and hypercapnia [facility])   [place] Elsie Stain, MD   8 months ago Upper respiratory tract infection, unspecified type   Primary Care at [facility], Kriste Basque, NP   8 months ago Upper respiratory tract infection, unspecified type   Primary Care at [facility], Kriste Basque, NP               ibuprofen (ADVIL) 600 MG tablet [Pharmacy Med Name: IBUPROFEN 600 MG ORAL TABLET] 90 tablet 0    Sig: TAKE 1 TABLET(600 MG) BY MOUTH EVERY 6 HOURS AS NEEDED     Analgesics:  NSAIDS Failed - 04/05/2022  9:55 AM      Failed - Manual Review: Labs are only required if the patient has taken medication for more than 8 weeks.      Passed - Cr in normal range and within 360 days    Creatinine, Ser  Date Value Ref Range Status  06/28/2021 0.84 0.57 - 1.00 mg/dL Final         Passed - HGB in normal range and within 360 days    Hemoglobin  Date Value Ref Range Status  06/28/2021 11.2 11.1 - 15.9 g/dL Final         Passed - PLT in normal range and within 360 days    Platelets  Date Value Ref Range Status  06/28/2021 299 150 - 450 x10E3/uL Final         Passed - HCT in normal range and within 360 days    Hematocrit  Date Value Ref Range Status  06/28/2021 34.2 34.0 - 46.6 % Final         Passed - eGFR is 30 or above and within 360 days    GFR calc Af Amer  Date Value Ref Range Status  11/04/2018 >60 >60 mL/min Final   GFR calc non Af Amer  Date Value Ref Range Status  11/04/2018 >60 >60 mL/min Final   eGFR  Date Value Ref Range Status  06/28/2021 77 >59 mL/min/1.73 Final         Passed - Patient is not pregnant      Passed - Valid encounter within last 12 months    Recent Outpatient Visits           4 months ago Chronic respiratory failure with hypoxia and hypercapnia ([place])   [place] Elsie Stain, MD   4 months ago Generalized anxiety disorder   [place], [place] W, NP   6 months ago Chronic respiratory failure with hypoxia and hypercapnia [facility])   [place] Elsie Stain, MD   8 months ago Upper respiratory tract infection, unspecified  type   Primary Care at [facility], Kriste Basque, NP   8 months ago Upper respiratory tract  infection, unspecified type   Primary Care at [facility], Kriste Basque, NP

## 2022-04-06 DIAGNOSIS — J9610 Chronic respiratory failure, unspecified whether with hypoxia or hypercapnia: Secondary | ICD-10-CM | POA: Diagnosis not present

## 2022-04-06 DIAGNOSIS — J449 Chronic obstructive pulmonary disease, unspecified: Secondary | ICD-10-CM | POA: Diagnosis not present

## 2022-04-19 DIAGNOSIS — M1990 Unspecified osteoarthritis, unspecified site: Secondary | ICD-10-CM | POA: Diagnosis not present

## 2022-04-19 DIAGNOSIS — J432 Centrilobular emphysema: Secondary | ICD-10-CM | POA: Diagnosis not present

## 2022-04-19 DIAGNOSIS — I10 Essential (primary) hypertension: Secondary | ICD-10-CM | POA: Diagnosis not present

## 2022-04-19 DIAGNOSIS — Z7409 Other reduced mobility: Secondary | ICD-10-CM | POA: Diagnosis not present

## 2022-04-19 DIAGNOSIS — J9611 Chronic respiratory failure with hypoxia: Secondary | ICD-10-CM | POA: Diagnosis not present

## 2022-04-19 DIAGNOSIS — Z9981 Dependence on supplemental oxygen: Secondary | ICD-10-CM | POA: Diagnosis not present

## 2022-04-19 DIAGNOSIS — H5462 Unqualified visual loss, left eye, normal vision right eye: Secondary | ICD-10-CM | POA: Diagnosis not present

## 2022-04-19 DIAGNOSIS — Z8616 Personal history of COVID-19: Secondary | ICD-10-CM | POA: Diagnosis not present

## 2022-04-19 DIAGNOSIS — J9612 Chronic respiratory failure with hypercapnia: Secondary | ICD-10-CM | POA: Diagnosis not present

## 2022-04-19 DIAGNOSIS — J418 Mixed simple and mucopurulent chronic bronchitis: Secondary | ICD-10-CM | POA: Diagnosis not present

## 2022-04-24 ENCOUNTER — Telehealth: Payer: Self-pay | Admitting: Critical Care Medicine

## 2022-04-24 NOTE — Telephone Encounter (Signed)
Pt requests a Rx for a different type of inhaler because the current inhaler is no longer working for her. Pt requests that the Rx be for an inhaler in a canister that she can inhale not a spray. Pt asked that the Rx be sent to  [place], [place] Phone:  [phone]  Fax:  [phone]

## 2022-04-25 NOTE — Telephone Encounter (Signed)
Not sure which inhaler she is referring and she is overdue an appt  Schedule a video or inperson visit with me soon next 2-3 weeks ok to double

## 2022-04-27 ENCOUNTER — Encounter: Payer: Self-pay | Admitting: Critical Care Medicine

## 2022-04-27 NOTE — Telephone Encounter (Signed)
Again needs video visit any provider

## 2022-04-27 NOTE — Telephone Encounter (Signed)
I called to clarify and she stated that when she uses her inhaler the spray(medication) gets in her mouth. And wants something else.

## 2022-05-01 NOTE — Telephone Encounter (Signed)
Called patient and appointment was scheduled

## 2022-05-02 ENCOUNTER — Other Ambulatory Visit: Payer: Self-pay | Admitting: Critical Care Medicine

## 2022-05-04 ENCOUNTER — Telehealth: Payer: Self-pay | Admitting: Emergency Medicine

## 2022-05-04 NOTE — Telephone Encounter (Signed)
Copied from [place] [id number]. Topic: General - Other >> May 04, 2022  9:02 AM [place] B wrote: Reason for CRM: Tammy from [facility] called in states patient misse PT today because of hernia issues. She has an appt with Dr Joya Gaskins Tuesday to discussif she can continue therapy.

## 2022-05-04 NOTE — Telephone Encounter (Signed)
Noted, will let provider know as well.

## 2022-05-05 DIAGNOSIS — J449 Chronic obstructive pulmonary disease, unspecified: Secondary | ICD-10-CM | POA: Diagnosis not present

## 2022-05-07 DIAGNOSIS — J449 Chronic obstructive pulmonary disease, unspecified: Secondary | ICD-10-CM | POA: Diagnosis not present

## 2022-05-07 DIAGNOSIS — J9610 Chronic respiratory failure, unspecified whether with hypoxia or hypercapnia: Secondary | ICD-10-CM | POA: Diagnosis not present

## 2022-05-08 ENCOUNTER — Encounter: Payer: Self-pay | Admitting: Critical Care Medicine

## 2022-05-08 ENCOUNTER — Telehealth: Payer: Self-pay | Admitting: Critical Care Medicine

## 2022-05-08 ENCOUNTER — Telehealth (HOSPITAL_BASED_OUTPATIENT_CLINIC_OR_DEPARTMENT_OTHER): Payer: Medicare Other | Admitting: Critical Care Medicine

## 2022-05-08 DIAGNOSIS — Z9981 Dependence on supplemental oxygen: Secondary | ICD-10-CM | POA: Diagnosis not present

## 2022-05-08 DIAGNOSIS — J441 Chronic obstructive pulmonary disease with (acute) exacerbation: Secondary | ICD-10-CM | POA: Diagnosis not present

## 2022-05-08 DIAGNOSIS — Z741 Need for assistance with personal care: Secondary | ICD-10-CM

## 2022-05-08 DIAGNOSIS — J418 Mixed simple and mucopurulent chronic bronchitis: Secondary | ICD-10-CM

## 2022-05-08 DIAGNOSIS — Z7409 Other reduced mobility: Secondary | ICD-10-CM

## 2022-05-08 MED ORDER — PREDNISONE 10 MG PO TABS: ORAL_TABLET | ORAL | 0 refills | Status: DC

## 2022-05-08 MED ORDER — SALINE SPRAY 0.65 % NA SOLN: 2.0000 | NASAL | 3 refills | Status: DC | PRN

## 2022-05-08 MED ORDER — IPRATROPIUM-ALBUTEROL 0.5-2.5 (3) MG/3ML IN SOLN
3.0000 mL | Freq: Four times a day (QID) | RESPIRATORY_TRACT | 6 refills | Status: DC
Start: 2022-05-08 — End: 2022-07-16

## 2022-05-08 NOTE — Telephone Encounter (Signed)
This patient is declining very quickly  See if we can increase her hours for PCS to 6 hours daily 5 days a week  Patient needs assistance with ambulating about the home with her rollator and increased time to help with bathing and ADLs and helping to keep her room clean

## 2022-05-08 NOTE — Assessment & Plan Note (Signed)
Continue oxygen as prescribed 4-1/2 to 5 L

## 2022-05-08 NOTE — Assessment & Plan Note (Signed)
Patient is declining quickly will ask for more personal care hours she yet may require assisted living

## 2022-05-08 NOTE — Progress Notes (Signed)
Established Patient Office Visit  Subjective:  Patient ID: Bianca Murray, female    DOB: 12-24-1955  Age: 66 y.o. MRN: [id number] Virtual Visit via Video Note  I connected with Bianca Murray on 05/08/22 at 1030am by a video enabled telemedicine application and verified that I am speaking with the correct person using two identifiers.   Consent:  I discussed the limitations, risks, security and privacy concerns of performing an evaluation and management service by video visit and the availability of in person appointments. I also discussed with the patient that there may be a patient responsible charge related to this service. The patient expressed understanding and agreed to proceed.  Location of patient: Patient's at home  Location of provider: I am in my office  Persons participating in the televisit with the patient.    No one else on the call   History of Present Illness:    CC: Primary care follow-up  HPI 09/28/2021 [name] presents for primary care follow-up on video.  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  She does tell me she now has her motorized wheelchair and this is doing well inside her home.  Her breathing is at baseline.  She has gotten over the COVID illness she had in the past year.  She needs refills on multiple medications.  There are no primary care gaps active at this time.  She might benefit from a shingles vaccine.  Patient still being visited by the EmT.  11/2021 Patient returns for 58-monthfollow-up with a video visit.  She had an eye exam recently and she has had decreased vision in the left eye she is concerned about this.  She was given Klonopin by the PA McClung 2 weeks ago she was only given 14 pills.  She also has hydroxyzine she states this does help.  She had an issue with Klonopin in the past would like to avoid further Klonopin.  Patient did receive  azithromycin with a video visit with another provider for cough the cough now has resolved.  She is on 4 and half liters of oxygen today her saturation is 95%.  Blood pressure today 135/80.  Pulse is 100.  Patient would like a new handicap sticker.  She needs refills on a variety of her medications.  She would like her hernia and the abdomen examined.  There are no other complaints at this time.  05/08/22 Since the last visit the patient is declining quickly.  She is on 4-1/2 to 5 L oxygen.  She needs help with bathing cleaning her room and now needs help with ambulation.  Her daughter is away during the day working.  She is by herself.  She does have a motorized wheelchair.  She also has a rollator.  She would like help with ambulating a bit around the home.  She is able to manage her own medications.  She states that she cannot use the breztri any longer she cannot take a deep enough breath to get the medication in her lungs.  She does have a home nebulizer only has albuterol for this.  She does have a cough productive of white bubbly mucus.  Her saturations were in 86 to 88% on 4-1/2 L.  There is no chest pain.  She currently has 2 hours 5 days a week Monday through Friday a PCS she is requesting now 6 hours  a day 5 days a week.  She would be satisfied with 4 hours a day 5 days a week if she cannot get the 6 hours   Past Medical History:  Diagnosis Date   Arthritis    Asthma    Cancer ([place])    Chronic respiratory failure with hypoxia and hypercapnia (HCC)    COPD (chronic obstructive pulmonary disease) ([place])    Hypertension     Past Surgical History:  Procedure Laterality Date   ABDOMINAL HYSTERECTOMY     TRACHEOSTOMY     reversed   TRACHEOSTOMY CLOSURE      Family History  Problem Relation Age of Onset   Hypertension Mother    Heart disease Mother    Cancer Mother    Asthma Father    Asthma Sister    Hypertension Sister    Asthma Brother    Cancer Brother     Social History    Socioeconomic History   Marital status: Single    Spouse name: Not on file   Number of children: Not on file   Years of education: Not on file   Highest education level: Not on file  Occupational History   Not on file  Tobacco Use   Smoking status: Former   Smokeless tobacco: Never   Tobacco comments:    quit smoking in 2018    Vaping Use   Vaping Use: Never used  Substance and Sexual Activity   Alcohol use: Not Currently   Drug use: Not Currently   Sexual activity: Not on file  Other Topics Concern   Not on file  Social History Narrative   Not on file   Social Determinants of Health   Financial Resource Strain: Not on file  Food Insecurity: Not on file  Transportation Needs: Not on file  Physical Activity: Not on file  Stress: Not on file  Social Connections: Not on file  Intimate Partner Violence: Not on file    Outpatient Medications Prior to Visit  Medication Sig Dispense Refill   OXYGEN 4-5 Liter DAILY (route: Oxygen)     albuterol (PROVENTIL) (2.5 MG/3ML) 0.083% nebulizer solution Take 3 mLs (2.5 mg total) by nebulization 3 (three) times daily. 270 mL 1   albuterol (VENTOLIN HFA) 108 (90 Base) MCG/ACT inhaler INHALE 2 PUFFS INTO THE LUNGS EVERY 6 (SIX) HOURS AS NEEDED FOR WHEEZING OR SHORTNESS OF BREATH. 8.5 g 0   amLODipine (NORVASC) 5 MG tablet TAKE 1 TABLET(5 MG) BY MOUTH DAILY 90 tablet 1   atorvastatin (LIPITOR) 10 MG tablet Take 1 tablet (10 mg total) by mouth daily. 90 tablet 3   azelastine (ASTELIN) 0.1 % nasal spray Place 2 sprays into both nostrils 2 (two) times daily. Use in each nostril as directed 30 mL 12   Blood Pressure Monitoring (BLOOD PRESSURE MONITOR AUTOMAT) DEVI Measure blood pressure and pulse daily 1 Device 0   cyclobenzaprine (FLEXERIL) 10 MG tablet TAKE 1 TABLET (10 MG TOTAL) BY MOUTH 3 (THREE) TIMES DAILY AS NEEDED FOR MUSCLE SPASMS. 60 tablet 0   fluticasone (FLONASE) 50 MCG/ACT nasal spray Place 2 sprays into both nostrils daily. 16  g 6   gabapentin (NEURONTIN) 100 MG capsule TAKE 1 CAPSULE (100 MG TOTAL) BY MOUTH 3 (THREE) TIMES DAILY. (AM+NOON+BEDTIME) 90 capsule 3   hydrOXYzine (ATARAX) 25 MG tablet TAKE 1 TABLET (25 MG TOTAL) BY MOUTH 3 (THREE) TIMES DAILY AS NEEDED FOR ANXIETY. 60 tablet 0   ibuprofen (ADVIL) 600 MG tablet TAKE 1  TABLET(600 MG) BY MOUTH EVERY 6 HOURS AS NEEDED 90 tablet 0   loratadine (CLARITIN) 10 MG tablet TAKE ONE TABLET BY MOUTH ONCE DAILY (AM) 90 tablet 0   losartan (COZAAR) 100 MG tablet Take 1 tablet (100 mg total) by mouth daily. 90 tablet 2   Misc. Devices (PULSE OXIMETER FOR FINGER) MISC 1 application by Does not apply route daily as needed. 1 each 0   Misc. Devices MISC Nebulizer machine, Large shower chair.  Diagnosis- chronic respiratory failure 1 each 0   pantoprazole (PROTONIX) 40 MG tablet TAKE 1 TABLET BY MOUTH 2 (TWO) TIMES DAILY BEFORE A MEAL (AM+EVENING) 60 tablet 0   polyethylene glycol powder (GLYCOLAX/MIRALAX) 17 GM/SCOOP powder Take 17 g by mouth daily as needed. 507 g 1   promethazine-dextromethorphan (PROMETHAZINE-DM) 6.25-15 MG/5ML syrup Take 5 mLs by mouth 4 (four) times daily as needed. 118 mL 0   Spacer/Aero-Holding Chambers (AEROCHAMBER MV) inhaler Use as instructed 1 each 0   Vitamin D, Ergocalciferol, (DRISDOL) 1.25 MG (50000 UNIT) CAPS capsule Take 1 capsule (50,000 Units total) by mouth every 7 (seven) days. 12 capsule 1   Budeson-Glycopyrrol-Formoterol (BREZTRI AEROSPHERE) 160-9-4.8 MCG/ACT AERO Inhale 2 puffs into the lungs in the morning and at bedtime. 10.7 g 6   No facility-administered medications prior to visit.    No Known Allergies  ROS Review of Systems  Constitutional: Negative.   HENT:  Positive for nosebleeds. Negative for ear pain, postnasal drip, rhinorrhea, sinus pressure, sore throat, trouble swallowing and voice change.   Eyes:  Positive for visual disturbance.  Respiratory:  Positive for cough, chest tightness, shortness of breath and wheezing.  Negative for apnea, choking and stridor.        Mucus is bubbles clear  Cardiovascular: Negative.  Negative for chest pain, palpitations and leg swelling.  Gastrointestinal: Negative.  Negative for abdominal distention, abdominal pain, constipation, diarrhea, nausea and vomiting.  Genitourinary: Negative.   Musculoskeletal: Negative.  Negative for arthralgias and myalgias.  Skin: Negative.  Negative for rash.  Allergic/Immunologic: Negative.  Negative for environmental allergies and food allergies.  Neurological: Negative.  Negative for dizziness, syncope, weakness and headaches.  Hematological: Negative.  Negative for adenopathy. Does not bruise/bleed easily.  Psychiatric/Behavioral: Negative.  Negative for agitation and sleep disturbance. The patient is not nervous/anxious.       Objective:    Physical Exam No exam this is a video visit patient is in no distress on video There were no vitals taken for this visit. Wt Readings from Last 3 Encounters:  08/24/21 212 lb (96.2 kg)  06/28/21 217 lb (98.4 kg)  01/26/21 225 lb (102.1 kg)     Health Maintenance Due  Topic Date Due   Zoster Vaccines- Shingrix (1 of 2) Never done   COVID-19 Vaccine (3 - Moderna series) 09/20/2020   INFLUENZA VACCINE  03/13/2022    There are no preventive care reminders to display for this patient.  No results found for: "TSH" Lab Results  Component Value Date   WBC 7.0 06/28/2021   HGB 11.2 06/28/2021   HCT 34.2 06/28/2021   MCV 86 06/28/2021   PLT 299 06/28/2021   Lab Results  Component Value Date   NA 145 (H) 06/28/2021   K 4.4 06/28/2021   CO2 29 06/28/2021   GLUCOSE 114 (H) 06/28/2021   BUN 11 06/28/2021   CREATININE 0.84 06/28/2021   BILITOT <0.2 06/28/2021   ALKPHOS 80 06/28/2021   AST 13 06/28/2021   ALT 9 06/28/2021  PROT 7.3 06/28/2021   ALBUMIN 4.7 06/28/2021   CALCIUM 9.8 06/28/2021   ANIONGAP 7 11/04/2018   EGFR 77 06/28/2021   Lab Results  Component Value Date    CHOL 343 (H) 06/28/2021   Lab Results  Component Value Date   HDL 135 06/28/2021   Lab Results  Component Value Date   LDLCALC 201 (H) 06/28/2021   Lab Results  Component Value Date   TRIG 61 06/28/2021   Lab Results  Component Value Date   CHOLHDL 2.5 06/28/2021   No results found for: "HGBA1C"    Assessment & Plan:   Problem List Items Addressed This Visit       Respiratory   COPD GOLD D (chronic obstructive pulmonary disease) ([place])    COPD with acute exacerbation plan is to give pulsed prednisone 40 mg daily and change breztri to DuoNebs 4 times daily and discontinue Brezrti      Relevant Medications   ipratropium-albuterol (DUONEB) 0.5-2.5 (3) MG/3ML SOLN   sodium chloride (OCEAN) 0.65 % SOLN nasal spray   predniSONE (DELTASONE) 10 MG tablet     Other   Impaired mobility and personal care    Patient is declining quickly will ask for more personal care hours she yet may require assisted living      Dependence on supplemental oxygen    Continue oxygen as prescribed 4-1/2 to 5 L      Meds ordered this encounter  Medications   ipratropium-albuterol (DUONEB) 0.5-2.5 (3) MG/3ML SOLN    Sig: Take 3 mLs by nebulization 4 (four) times daily.    Dispense:  360 mL    Refill:  6    DC Breztri cannot deeply inhaler for HFA inhalers any longer, pls deliver   sodium chloride (OCEAN) 0.65 % SOLN nasal spray    Sig: Place 2 sprays into both nostrils as needed for congestion.    Dispense:  60 mL    Refill:  3    Pls deliver   predniSONE (DELTASONE) 10 MG tablet    Sig: Take 4 tablets daily for 5 days then stop    Dispense:  20 tablet    Refill:  0    Pls deliver    Follow Up Instructions:  Patient knows we will request increased hours for PCS and we will switch her to the DuoNeb device and also Ocean Spray nasal spray and pulse prednisone  I discussed the assessment and treatment plan with the patient. The patient was provided an opportunity to ask questions and  all were answered. The patient agreed with the plan and demonstrated an understanding of the instructions.   The patient was advised to call back or seek an in-person evaluation if the symptoms worsen or if the condition fails to improve as anticipated.  I provided 28 minutes of non-face-to-face time during this encounter  including  median intraservice time , review of notes, labs, imaging, medications  and explaining diagnosis and management to the patient .    Asencion Noble, MD

## 2022-05-08 NOTE — Assessment & Plan Note (Signed)
COPD with acute exacerbation plan is to give pulsed prednisone 40 mg daily and change breztri to DuoNebs 4 times daily and discontinue [employer]

## 2022-05-09 ENCOUNTER — Ambulatory Visit: Payer: Self-pay | Admitting: *Deleted

## 2022-05-09 NOTE — Telephone Encounter (Signed)
Change in medical status referral faxed to [facility] requesting additional PCS hours

## 2022-05-09 NOTE — Telephone Encounter (Signed)
Message forwarded to MD office due to inability to reach pt.

## 2022-05-09 NOTE — Telephone Encounter (Signed)
No alternative and would just STOP prednisone

## 2022-05-09 NOTE — Telephone Encounter (Signed)
Prednisone is making her feel sick asking for alternative medication.

## 2022-05-09 NOTE — Telephone Encounter (Signed)
Have not been able to get the number to work. It says the call can not be completed. Will continue to try.

## 2022-05-09 NOTE — Telephone Encounter (Signed)
Have been unable to reach pt. The recording says this call is unable to be connected at this time. I have called multiple times over the past hour. I will send triage request to [facility].    Summary: Cannot tolerate prednisone/Dizziness   Patient called in stating the prednisone makes her very lightheaded and dizzy. She states she has had issues with it in the past as well. She has COPD and believes she was prescribed this to help with the breathing but she cannot tolerate this medicate. She is wondering if there is something else she can take instead. She uses   [place] [place]  Phone: [phone]  Fax: [phone]    Please assist patient further

## 2022-05-10 NOTE — Telephone Encounter (Signed)
Called patient and she is aware of note  

## 2022-05-10 NOTE — Telephone Encounter (Signed)
Patient asking what can she do to help with her breathing (she is aware of no alternatives for prednisone)

## 2022-05-11 NOTE — Telephone Encounter (Signed)
Nothing else to offer, may need to go to ED

## 2022-05-14 NOTE — Telephone Encounter (Signed)
Patient stated that she has been having headache and today checked her bp it was 142/86 and pulse 103, do you want to see her for virtual

## 2022-05-14 NOTE — Telephone Encounter (Signed)
Carly I have no slots left this week. Will need either ED/ UC or another provider in the community care system

## 2022-05-14 NOTE — Telephone Encounter (Signed)
Called patient and she is aware

## 2022-05-16 DIAGNOSIS — H5462 Unqualified visual loss, left eye, normal vision right eye: Secondary | ICD-10-CM | POA: Diagnosis not present

## 2022-05-16 DIAGNOSIS — J9612 Chronic respiratory failure with hypercapnia: Secondary | ICD-10-CM | POA: Diagnosis not present

## 2022-05-16 DIAGNOSIS — J9611 Chronic respiratory failure with hypoxia: Secondary | ICD-10-CM | POA: Diagnosis not present

## 2022-05-16 DIAGNOSIS — J432 Centrilobular emphysema: Secondary | ICD-10-CM | POA: Diagnosis not present

## 2022-05-16 DIAGNOSIS — I10 Essential (primary) hypertension: Secondary | ICD-10-CM | POA: Diagnosis not present

## 2022-05-16 DIAGNOSIS — Z7409 Other reduced mobility: Secondary | ICD-10-CM | POA: Diagnosis not present

## 2022-05-16 DIAGNOSIS — M1990 Unspecified osteoarthritis, unspecified site: Secondary | ICD-10-CM | POA: Diagnosis not present

## 2022-05-16 DIAGNOSIS — Z8616 Personal history of COVID-19: Secondary | ICD-10-CM | POA: Diagnosis not present

## 2022-05-16 DIAGNOSIS — J418 Mixed simple and mucopurulent chronic bronchitis: Secondary | ICD-10-CM | POA: Diagnosis not present

## 2022-05-16 DIAGNOSIS — Z9981 Dependence on supplemental oxygen: Secondary | ICD-10-CM | POA: Diagnosis not present

## 2022-05-16 NOTE — Telephone Encounter (Signed)
Called and made appointment for next week, told patient if she needs anything else please give [place] a call. Verbalized understanding

## 2022-05-23 ENCOUNTER — Telehealth: Payer: Self-pay

## 2022-05-23 ENCOUNTER — Telehealth: Payer: Medicare Other | Admitting: Critical Care Medicine

## 2022-05-23 NOTE — Telephone Encounter (Signed)
I returned call to patient's daughter, Page Spiro , and explained that the request for an increase in PCS hours was sent to [employer] on 05/09/2022.  [facility] is no longer managing PCS as of the end of September and [facility] will now be accepting [facility] referrals for review.  The referral has now been faxed to [facility].   [name] said she also learned of the change of management and understood that Judithann Graves now needs to review the request. She wanted to make me aware of the change.

## 2022-05-24 ENCOUNTER — Ambulatory Visit: Payer: Medicare Other | Attending: Critical Care Medicine | Admitting: Critical Care Medicine

## 2022-05-24 ENCOUNTER — Encounter: Payer: Self-pay | Admitting: Critical Care Medicine

## 2022-05-24 DIAGNOSIS — J449 Chronic obstructive pulmonary disease, unspecified: Secondary | ICD-10-CM | POA: Diagnosis not present

## 2022-05-24 DIAGNOSIS — J9611 Chronic respiratory failure with hypoxia: Secondary | ICD-10-CM

## 2022-05-24 DIAGNOSIS — J418 Mixed simple and mucopurulent chronic bronchitis: Secondary | ICD-10-CM

## 2022-05-24 DIAGNOSIS — J9612 Chronic respiratory failure with hypercapnia: Secondary | ICD-10-CM

## 2022-05-24 DIAGNOSIS — I10 Essential (primary) hypertension: Secondary | ICD-10-CM

## 2022-05-24 NOTE — Assessment & Plan Note (Signed)
Patient on maximal therapy

## 2022-05-24 NOTE — Assessment & Plan Note (Signed)
Blood pressure at goal no changes ?

## 2022-05-24 NOTE — Assessment & Plan Note (Signed)
Progressive respiratory failure I discussed assisted living with the patient

## 2022-05-24 NOTE — Progress Notes (Signed)
Established Patient Office Visit  Subjective:  Patient ID: Bianca Murray, female    DOB: 02/11/1956  Age: 66 y.o. MRN: [id number] Virtual Visit via phone note  I connected with Bianca Murray on 05/24/22 at 1030am by a phone enabled telemedicine application and verified that I am speaking with the correct person using two identifiers.   Consent:  I discussed the limitations, risks, security and privacy concerns of performing an evaluation and management service by phone visit and the availability of in person appointments. I also discussed with the patient that there may be a patient responsible charge related to this service. The patient expressed understanding and agreed to proceed.  Location of patient: Patient's at home  Location of provider: I am in my office  Persons participating in the televisit with the patient.    No one else on the call   History of Present Illness:    CC: Primary care follow-up  HPI 09/28/2021 Bianca Murray presents for primary care follow-up on video.  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  She does tell me she now has her motorized wheelchair and this is doing well inside her home.  Her breathing is at baseline.  She has gotten over the COVID illness she had in the past year.  She needs refills on multiple medications.  There are no primary care gaps active at this time.  She might benefit from a shingles vaccine.  Patient still being visited by the Em[facility].  11/2021 Patient returns for 79-monthfollow-up with a video visit.  She had an eye exam recently and she has had decreased vision in the left eye she is concerned about this.  She was given Klonopin by the PA McClung 2 weeks ago she was only given 14 pills.  She also has hydroxyzine she states this does help.  She had an issue with Klonopin in the past would like to avoid further Klonopin.  Patient did receive  azithromycin with a video visit with another provider for cough the cough now has resolved.  She is on 4 and half liters of oxygen today her saturation is 95%.  Blood pressure today 135/80.  Pulse is 100.  Patient would like a new handicap sticker.  She needs refills on a variety of her medications.  She would like her hernia and the abdomen examined.  There are no other complaints at this time.  05/08/22 Since the last visit the patient is declining quickly.  She is on 4-1/2 to 5 L oxygen.  She needs help with bathing cleaning her room and now needs help with ambulation.  Her daughter is away during the day working.  She is by herself.  She does have a motorized wheelchair.  She also has a rollator.  She would like help with ambulating a bit around the home.  She is able to manage her own medications.  She states that she cannot use the breztri any longer she cannot take a deep enough breath to get the medication in her lungs.  She does have a home nebulizer only has albuterol for this.  She does have a cough productive of white bubbly mucus.  Her saturations were in 86 to 88% on 4-1/2 L.  There is no chest pain.  She currently has 2 hours 5 days a week Monday through Friday a PCS she is requesting now 6 hours  a day 5 days a week.  She would be satisfied with 4 hours a day 5 days a week if she cannot get the 6 hours  10/12 Patient seen in return follow-up by telephone contact she has been concerned about blood pressure most the time is 130/80 but recently 153/90 she just finished a course of prednisone this caused lightheadedness she had to stop early.  She is concerned about her breathlessness getting worse over time.  Past Medical History:  Diagnosis Date   Arthritis    Asthma    Cancer ([place])    Chronic respiratory failure with hypoxia and hypercapnia (HCC)    COPD (chronic obstructive pulmonary disease) (HCC)    Hypertension     Past Surgical History:  Procedure Laterality Date   ABDOMINAL  HYSTERECTOMY     TRACHEOSTOMY     reversed   TRACHEOSTOMY CLOSURE      Family History  Problem Relation Age of Onset   Hypertension Mother    Heart disease Mother    Cancer Mother    Asthma Father    Asthma Sister    Hypertension Sister    Asthma Brother    Cancer Brother     Social History   Socioeconomic History   Marital status: Single    Spouse name: Not on file   Number of children: Not on file   Years of education: Not on file   Highest education level: Not on file  Occupational History   Not on file  Tobacco Use   Smoking status: Former   Smokeless tobacco: Never   Tobacco comments:    quit smoking in 2018    Vaping Use   Vaping Use: Never used  Substance and Sexual Activity   Alcohol use: Not Currently   Drug use: Not Currently   Sexual activity: Not on file  Other Topics Concern   Not on file  Social History Narrative   Not on file   Social Determinants of Health   Financial Resource Strain: Not on file  Food Insecurity: Not on file  Transportation Needs: Not on file  Physical Activity: Not on file  Stress: Not on file  Social Connections: Not on file  Intimate Partner Violence: Not on file    Outpatient Medications Prior to Visit  Medication Sig Dispense Refill   albuterol (VENTOLIN HFA) 108 (90 Base) MCG/ACT inhaler INHALE 2 PUFFS INTO THE LUNGS EVERY 6 (SIX) HOURS AS NEEDED FOR WHEEZING OR SHORTNESS OF BREATH. 8.5 g 0   amLODipine (NORVASC) 5 MG tablet TAKE 1 TABLET(5 MG) BY MOUTH DAILY 90 tablet 1   atorvastatin (LIPITOR) 10 MG tablet Take 1 tablet (10 mg total) by mouth daily. 90 tablet 3   azelastine (ASTELIN) 0.1 % nasal spray Place 2 sprays into both nostrils 2 (two) times daily. Use in each nostril as directed 30 mL 12   Blood Pressure Monitoring (BLOOD PRESSURE MONITOR AUTOMAT) DEVI Measure blood pressure and pulse daily 1 Device 0   cyclobenzaprine (FLEXERIL) 10 MG tablet TAKE 1 TABLET (10 MG TOTAL) BY MOUTH 3 (THREE) TIMES DAILY AS  NEEDED FOR MUSCLE SPASMS. 60 tablet 0   fluticasone (FLONASE) 50 MCG/ACT nasal spray Place 2 sprays into both nostrils daily. 16 g 6   gabapentin (NEURONTIN) 100 MG capsule TAKE 1 CAPSULE (100 MG TOTAL) BY MOUTH 3 (THREE) TIMES DAILY. (AM+NOON+BEDTIME) 90 capsule 3   hydrOXYzine (ATARAX) 25 MG tablet TAKE 1 TABLET (25 MG TOTAL) BY MOUTH 3 (THREE) TIMES DAILY  AS NEEDED FOR ANXIETY. 60 tablet 0   ibuprofen (ADVIL) 600 MG tablet TAKE 1 TABLET(600 MG) BY MOUTH EVERY 6 HOURS AS NEEDED 90 tablet 0   ipratropium-albuterol (DUONEB) 0.5-2.5 (3) MG/3ML SOLN Take 3 mLs by nebulization 4 (four) times daily. 360 mL 6   loratadine (CLARITIN) 10 MG tablet TAKE ONE TABLET BY MOUTH ONCE DAILY (AM) 90 tablet 0   losartan (COZAAR) 100 MG tablet Take 1 tablet (100 mg total) by mouth daily. 90 tablet 2   Misc. Devices (PULSE OXIMETER FOR FINGER) MISC 1 application by Does not apply route daily as needed. 1 each 0   Misc. Devices MISC Nebulizer machine, Large shower chair.  Diagnosis- chronic respiratory failure 1 each 0   OXYGEN 4-5 Liter DAILY (route: Oxygen)     pantoprazole (PROTONIX) 40 MG tablet TAKE 1 TABLET BY MOUTH 2 (TWO) TIMES DAILY BEFORE A MEAL (AM+EVENING) 60 tablet 0   polyethylene glycol powder (GLYCOLAX/MIRALAX) 17 GM/SCOOP powder Take 17 g by mouth daily as needed. 507 g 1   promethazine-dextromethorphan (PROMETHAZINE-DM) 6.25-15 MG/5ML syrup Take 5 mLs by mouth 4 (four) times daily as needed. 118 mL 0   sodium chloride (OCEAN) 0.65 % SOLN nasal spray Place 2 sprays into both nostrils as needed for congestion. 60 mL 3   Spacer/Aero-Holding Chambers (AEROCHAMBER MV) inhaler Use as instructed 1 each 0   Vitamin D, Ergocalciferol, (DRISDOL) 1.25 MG (50000 UNIT) CAPS capsule Take 1 capsule (50,000 Units total) by mouth every 7 (seven) days. 12 capsule 1   predniSONE (DELTASONE) 10 MG tablet Take 4 tablets daily for 5 days then stop 20 tablet 0   albuterol (PROVENTIL) (2.5 MG/3ML) 0.083% nebulizer solution  Take 3 mLs (2.5 mg total) by nebulization 3 (three) times daily. 270 mL 1   No facility-administered medications prior to visit.    Allergies  Allergen Reactions   Prednisone Anxiety    ROS Review of Systems  Constitutional: Negative.   HENT:  Negative for ear pain, nosebleeds, postnasal drip, rhinorrhea, sinus pressure, sore throat, trouble swallowing and voice change.   Eyes:  Negative for visual disturbance.  Respiratory:  Positive for shortness of breath and wheezing. Negative for apnea, cough, choking, chest tightness and stridor.        Mucus is bubbles clear  Cardiovascular: Negative.  Negative for chest pain, palpitations and leg swelling.  Gastrointestinal: Negative.  Negative for abdominal distention, abdominal pain, constipation, diarrhea, nausea and vomiting.  Genitourinary: Negative.   Musculoskeletal: Negative.  Negative for arthralgias and myalgias.  Skin: Negative.  Negative for rash.  Allergic/Immunologic: Negative.  Negative for environmental allergies and food allergies.  Neurological: Negative.  Negative for dizziness, syncope, weakness and headaches.  Hematological: Negative.  Negative for adenopathy. Does not bruise/bleed easily.  Psychiatric/Behavioral: Negative.  Negative for agitation and sleep disturbance. The patient is not nervous/anxious.       Objective:    Physical Exam No exam this is a phone visit There were no vitals taken for this visit. Wt Readings from Last 3 Encounters:  08/24/21 212 lb (96.2 kg)  06/28/21 217 lb (98.4 kg)  01/26/21 225 lb (102.1 kg)     Health Maintenance Due  Topic Date Due   Zoster Vaccines- Shingrix (1 of 2) Never done   COVID-19 Vaccine (3 - Moderna series) 09/20/2020   INFLUENZA VACCINE  03/13/2022    There are no preventive care reminders to display for this patient.  No results found for: "TSH" Lab Results  Component  Value Date   WBC 7.0 06/28/2021   HGB 11.2 06/28/2021   HCT 34.2 06/28/2021   MCV  86 06/28/2021   PLT 299 06/28/2021   Lab Results  Component Value Date   NA 145 (H) 06/28/2021   K 4.4 06/28/2021   CO2 29 06/28/2021   GLUCOSE 114 (H) 06/28/2021   BUN 11 06/28/2021   CREATININE 0.84 06/28/2021   BILITOT <0.2 06/28/2021   ALKPHOS 80 06/28/2021   AST 13 06/28/2021   ALT 9 06/28/2021   PROT 7.3 06/28/2021   ALBUMIN 4.7 06/28/2021   CALCIUM 9.8 06/28/2021   ANIONGAP 7 11/04/2018   EGFR 77 06/28/2021   Lab Results  Component Value Date   CHOL 343 (H) 06/28/2021   Lab Results  Component Value Date   HDL 135 06/28/2021   Lab Results  Component Value Date   LDLCALC 201 (H) 06/28/2021   Lab Results  Component Value Date   TRIG 61 06/28/2021   Lab Results  Component Value Date   CHOLHDL 2.5 06/28/2021   No results found for: "HGBA1C"    Assessment & Plan:   Problem List Items Addressed This Visit       Cardiovascular and Mediastinum   Hypertension    Blood pressure at goal no changes        Respiratory   COPD GOLD D (chronic obstructive pulmonary disease) ([place])    Patient on maximal therapy      Chronic respiratory failure with hypoxia and hypercapnia (HCC)    Progressive respiratory failure I discussed assisted living with the patient     No orders of the defined types were placed in this encounter.   Follow Up Instructions:  discussed potential assisted living I discussed the assessment and treatment plan with the patient. The patient was provided an opportunity to ask questions and all were answered. The patient agreed with the plan and demonstrated an understanding of the instructions.   The patient was advised to call back or seek an in-person evaluation if the symptoms worsen or if the condition fails to improve as anticipated.  I provided 15 minutes of non-face-to-face time during this encounter  including  median intraservice time , review of notes, labs, imaging, medications  and explaining diagnosis and management to the  patient .    Asencion Noble, MD

## 2022-05-29 ENCOUNTER — Other Ambulatory Visit: Payer: Self-pay | Admitting: Critical Care Medicine

## 2022-05-29 DIAGNOSIS — U071 COVID-19: Secondary | ICD-10-CM

## 2022-05-29 NOTE — Telephone Encounter (Signed)
Requested Prescriptions  Pending Prescriptions Disp Refills  . loratadine (CLARITIN) 10 MG tablet [Pharmacy Med Name: LORATADINE 10 MG ORAL TABLET] 90 tablet 0    Sig: TAKE ONE TABLET BY MOUTH ONCE DAILY (AM)     Ear, Nose, and Throat:  Antihistamines 2 Passed - 05/29/2022 12:29 PM      Passed - Cr in normal range and within 360 days    Creatinine, Ser  Date Value Ref Range Status  06/28/2021 0.84 0.57 - 1.00 mg/dL Final         Passed - Valid encounter within last 12 months    Recent Outpatient Visits          5 days ago Chronic respiratory failure with hypoxia and hypercapnia ([place])   [place] Elsie Stain, MD   3 weeks ago Mixed simple and mucopurulent chronic bronchitis ([place])   [place] Elsie Stain, MD   5 months ago Chronic respiratory failure with hypoxia and hypercapnia ([place])   [place] Elsie Stain, MD   6 months ago Generalized anxiety disorder   [place], Vernia Buff, NP   8 months ago Chronic respiratory failure with hypoxia and hypercapnia ([place])   [place] Elsie Stain, MD             . gabapentin (NEURONTIN) 100 MG capsule [Pharmacy Med Name: GABAPENTIN 100 MG ORAL CAPSULE] 90 capsule 3    Sig: TAKE 1 CAPSULE (100 MG TOTAL) BY MOUTH 3 (THREE) TIMES DAILY. (AM+NOON+BEDTIME)     Neurology: Anticonvulsants - gabapentin Passed - 05/29/2022 12:29 PM      Passed - Cr in normal range and within 360 days    Creatinine, Ser  Date Value Ref Range Status  06/28/2021 0.84 0.57 - 1.00 mg/dL Final         Passed - Completed PHQ-2 or PHQ-9 in the last 360 days      Passed - Valid encounter within last 12 months    Recent Outpatient Visits          5 days ago Chronic respiratory failure with hypoxia and hypercapnia ([place])   [place] Elsie Stain, MD    3 weeks ago Mixed simple and mucopurulent chronic bronchitis [facility])   [place] Elsie Stain, MD   5 months ago Chronic respiratory failure with hypoxia and hypercapnia [facility])   [place] Elsie Stain, MD   6 months ago Generalized anxiety disorder   [place], [place] W, NP   8 months ago Chronic respiratory failure with hypoxia and hypercapnia ([place])   [place], Patrick E, MD             . albuterol (VENTOLIN HFA) 108 (90 Base) MCG/ACT inhaler [Pharmacy Med Name: ALBUTEROL SULFATE HFA 108 (90 BASE) MCG/ACT INHALATION AEROSOL SOLUTION] 8.5 g 0    Sig: INHALE 2 PUFFS INTO THE LUNGS EVERY 6 (SIX) HOURS AS NEEDED FOR WHEEZING OR SHORTNESS OF BREATH.     Pulmonology:  Beta Agonists 2 Passed - 05/29/2022 12:29 PM      Passed - Last BP in normal range    BP Readings from Last 1 Encounters:  09/28/21 110/64         Passed -  Last Heart Rate in normal range    Pulse Readings from Last 1 Encounters:  08/24/21 96         Passed - Valid encounter within last 12 months    Recent Outpatient Visits          5 days ago Chronic respiratory failure with hypoxia and hypercapnia ([place])   [place] Elsie Stain, MD   3 weeks ago Mixed simple and mucopurulent chronic bronchitis [facility])   [place] Elsie Stain, MD   5 months ago Chronic respiratory failure with hypoxia and hypercapnia [facility])   [place] Elsie Stain, MD   6 months ago Generalized anxiety disorder   [place], Vernia Buff, NP   8 months ago Chronic respiratory failure with hypoxia and hypercapnia [facility])   [place], Patrick E, MD

## 2022-06-04 DIAGNOSIS — J449 Chronic obstructive pulmonary disease, unspecified: Secondary | ICD-10-CM | POA: Diagnosis not present

## 2022-06-06 DIAGNOSIS — J9610 Chronic respiratory failure, unspecified whether with hypoxia or hypercapnia: Secondary | ICD-10-CM | POA: Diagnosis not present

## 2022-06-06 DIAGNOSIS — J449 Chronic obstructive pulmonary disease, unspecified: Secondary | ICD-10-CM | POA: Diagnosis not present

## 2022-06-13 ENCOUNTER — Telehealth: Payer: Self-pay | Admitting: Critical Care Medicine

## 2022-06-13 NOTE — Telephone Encounter (Signed)
I called patient's daughter, Page Spiro, and she explained that she has been the caregiver for her mother for 9 years and they are now interested in learning more about Assisted Living Facilities.    [name] stated that Dr Joya Gaskins spoke to her mother about ALF but her mother can't remember the information.  I explained to [place] what services/care ALFs provide and suggested that she can contact facilities and explain her mother's needs.  They will let her know if there is a female bed available and if not, if there is a waiting list she can be placed on.  Her mother has Medicaid and I explained the the facility will take most of her mother's monthly check as payment for room/meals/medication. [name] said she is aware of that.   I also explained that if a facility request documentation from the provider to please let us know   I provided [name] the contact information for the following facilities in [facility] that accept Medicaid: [facility] She said she will start calling them to collect information. I encouraged her to visit the facilities as well   I also provided her with the website for the [facility] to obtain even more information about ALFs and instructed her to call me with any questions.

## 2022-06-13 NOTE — Telephone Encounter (Signed)
Pt daughter states she had spoke with Dr Joya Gaskins re possibility of mother being put in assisted living, he told her to contact him at that point, Pt is ready to possible start the process, pls fu with Alferd Patee at [phone]

## 2022-06-14 NOTE — Telephone Encounter (Signed)
Copied from [place] [phone]. Topic: General - Inquiry >> Jun 14, 2022  1:12 PM Erskine Squibb wrote: Reason for CRM: The patient called in stating she has end stage or stage 4 of copd. She would like some information as to what to expect and the conditions of end stage copd. Please assist patient further

## 2022-06-14 NOTE — Telephone Encounter (Signed)
Spoke with [name]'s  alternate contact, her daughter Bianca Murray, we discussed contents of call placed on 11.1.23 with Opal Sidles. Patients daughter Bianca Murray states the quoted conversation did not happen and this not a concern at this time. That her only concerns were Assisted Living facilities for her Mother, all questions and concerns were covered in previous conversations with Opal Sidles on 11.1.23.  Copied from [place] [phone]. Topic: General - Inquiry >> Jun 14, 2022  1:12 PM Bianca Murray wrote: Reason for CRM: The patient called in stating she has end stage or stage 4 of copd. She would like some information as to what to expect and the conditions of end stage copd. Please assist patient further

## 2022-06-22 ENCOUNTER — Ambulatory Visit: Payer: Self-pay | Admitting: *Deleted

## 2022-06-22 MED ORDER — MISC. DEVICES MISC: 0 refills | Status: DC

## 2022-06-22 NOTE — Telephone Encounter (Signed)
Agree that she needs UC visit. Request for increase in [facility] service hours was submitted by Opal Sidles see note from 05/09/22. I have written Prescription for bedside commode.

## 2022-06-22 NOTE — Telephone Encounter (Signed)
Chief Complaint: worsening COPD. Shortness of breath with exertion. Requesting bedside commode and not raised toilet seat. Requesting caregiver / Aide support longer than 2 hours/ day  Symptoms: shortness of breath with exertion. No sx at rest. Wears 4- 4 1/2 liters oxygen continuous. Cough productive at times with white sputum with "bubbles" in it. Reports family member in house has had flu but is trying to wear a mask if comes in house. Reports anxiety due to family members want her to get up and do for herself and she can not. Requesting caregiver/ Aide support longer than 2 hours / day. Will have caregiver check O2 sat when she comes in . Frequency: 1 week  Pertinent Negatives: Patient denies chest pain no difficulty breathing. No fever. No dizziness.  Disposition: '[]'$ ED /'[x]'$ Urgent Care (no appt availability in office) / '[]'$ Appointment(In office/virtual)/ '[]'$  [facility]/ '[]'$ Home Care/ '[]'$ Refused Recommended Disposition /'[]'$ [facility]/ '[]'$  Follow-up with PCP Additional Notes:   Please advise. Patient is waiting on caregiver to assess O2 sat. Unsure of disposition.      Reason for Disposition  [1] Longstanding difficulty breathing (e.g., CHF, COPD, emphysema) AND [2] WORSE than normal  Answer Assessment - Initial Assessment Questions 1. RESPIRATORY STATUS: "Describe your breathing?" (e.g., wheezing, shortness of breath, unable to speak, severe coughing)      Shortness of breath with exertion more than normal  2. ONSET: "When did this breathing problem begin?"      1 week ago  3. PATTERN "Does the difficult breathing come and go, or has it been constant since it started?"      Comes and goes with exertion . Wears 4- 41/2 liters oxygen hx COPD 4. SEVERITY: "How bad is your breathing?" (e.g., mild, moderate, severe)    - MILD: No SOB at rest, mild SOB with walking, speaks normally in sentences, can lie down, no retractions, pulse < 100.    - MODERATE: SOB at rest, SOB with  minimal exertion and prefers to sit, cannot lie down flat, speaks in phrases, mild retractions, audible wheezing, pulse 100-120.    - SEVERE: Very SOB at rest, speaks in single words, struggling to breathe, sitting hunched forward, retractions, pulse > 120      No SOB at rest , with exertion. Feels anxiety at times reports forced to get up to complete tasks by family bc they feel she needs to get up and move 5. RECURRENT SYMPTOM: "Have you had difficulty breathing before?" If Yes ask: "When was the last time?" and "What happened that time?"      yes 6. CARDIAC HISTORY: "Do you have any history of heart disease?" (e.g., heart attack, angina, bypass surgery, angioplasty)      See hx  7. LUNG HISTORY: "Do you have any history of lung disease?"  (e.g., pulmonary embolus, asthma, emphysema)     Hx COPD 8. CAUSE: "What do you think is causing the breathing problem?"      Not sure . Family member in house has had flu but trying to wear a mask 9. OTHER SYMPTOMS: "Do you have any other symptoms? (e.g., dizziness, runny nose, cough, chest pain, fever)     Cough. 10. O2 SATURATION MONITOR:  "Do you use an oxygen saturation monitor (pulse oximeter) at home?" If Yes, ask: "What is your reading (oxygen level) today?" "What is your usual oxygen saturation reading?" (e.g., 95%)       Yes, does not have monitor at this time  11. PREGNANCY: "  Is there any chance you are pregnant?" "When was your last menstrual period?"       na 12. TRAVEL: "Have you traveled out of the country in the last month?" (e.g., travel history, exposures)       na  Protocols used: Breathing Difficulty-A-AH

## 2022-06-22 NOTE — Telephone Encounter (Signed)
Copied from [place]. Topic: General - Other >> Jun 22, 2022  9:14 AM [place] B wrote: Reason for CRM: Patient called in asking for toilet seat for her room because the one in her bathroom is too low

## 2022-06-25 NOTE — Telephone Encounter (Signed)
Patient aware of recommendations from provider.  Informed that [facility] was sent to pharmacy. Advised to call [facility] to check on order.  Patient inquiring about flu and PNA vaccine. Advised that she could come to office for flu vaccine, PNA is not needed annually.   Will call back to schedule Nurse visit when she is ready to receive.

## 2022-06-27 ENCOUNTER — Other Ambulatory Visit: Payer: Self-pay | Admitting: Critical Care Medicine

## 2022-06-28 ENCOUNTER — Telehealth: Payer: Self-pay | Admitting: Emergency Medicine

## 2022-06-28 NOTE — Telephone Encounter (Signed)
Copied from [place] [phone]. Topic: General - Other >> Jun 28, 2022 10:03 AM Sabas Sous wrote: Reason for CRM: Pt called back to report that [facility] will not approve her bedside commode and she needs to have the orders sent elsewhere. She says she was told that they do not accept her insurance. Please advise

## 2022-06-29 NOTE — Telephone Encounter (Signed)
Can you help, where can we send it that does take her insurance

## 2022-07-03 ENCOUNTER — Other Ambulatory Visit: Payer: Self-pay

## 2022-07-03 MED ORDER — MISC. DEVICES MISC: 0 refills | Status: DC

## 2022-07-04 ENCOUNTER — Telehealth: Payer: Self-pay | Admitting: Critical Care Medicine

## 2022-07-04 NOTE — Telephone Encounter (Signed)
Paperwork faxed to [facility]( [facility]) and patient made aware

## 2022-07-04 NOTE — Telephone Encounter (Signed)
Helene Kelp w/ [facility] needs the face to face notes for the bedside commode order.  Fax [phone]  Cb [phone]

## 2022-07-04 NOTE — Telephone Encounter (Unsigned)
Copied from [place] [phone]. Topic: General - Other >> Jun 06, 2022  2:12 PM [place] B wrote: Reason for CRM: Patient called in requesting if nurse can come to her home for a flu and pneumonia shot because she has coped and its hard for her to get out for an appt

## 2022-07-05 DIAGNOSIS — J449 Chronic obstructive pulmonary disease, unspecified: Secondary | ICD-10-CM | POA: Diagnosis not present

## 2022-07-07 DIAGNOSIS — J9610 Chronic respiratory failure, unspecified whether with hypoxia or hypercapnia: Secondary | ICD-10-CM | POA: Diagnosis not present

## 2022-07-07 DIAGNOSIS — J449 Chronic obstructive pulmonary disease, unspecified: Secondary | ICD-10-CM | POA: Diagnosis not present

## 2022-07-09 NOTE — Telephone Encounter (Signed)
Called patient and spoke with her about two options: [employer] are currently giving away free gently used bedside commodes and shower chairs and the there option is to go through [facility] but she is needing a visit. Patient wants information to give them a call about the free bedside commodes I also let patient know that I will hold on to paperwork if she is unable to get it. Patient verbalized understanding.     I will mychart message the information

## 2022-07-10 DIAGNOSIS — J449 Chronic obstructive pulmonary disease, unspecified: Secondary | ICD-10-CM | POA: Diagnosis not present

## 2022-07-10 NOTE — Telephone Encounter (Signed)
Copied from [place] [phone]. Topic: General - Other >> Jul 10, 2022 12:22 PM Sabas Sous wrote: Reason for CRM: Pt called requesting to speak to [place] contact: [phone]

## 2022-07-12 NOTE — Telephone Encounter (Addendum)
Pt daughter is requesting to speak with a member of staff. Requesting a referral to [facility] or any suggested facilities that may accept pt insurance. [facility] / [phone] (Fax)  Patient's daughter, Bianca Murray, explained that she has been the caregiver for her mother for 9 years and she is falling to pieces.   Please advise.

## 2022-07-12 NOTE — Telephone Encounter (Signed)
Opal Sidles. This is a good idea. Lets proceed with ALF referral

## 2022-07-13 NOTE — Telephone Encounter (Signed)
Pts daughter is calling Opal Sidles back

## 2022-07-13 NOTE — Telephone Encounter (Signed)
I returned the call to [facility] and explained to her that I am not aware of [facility], only [facility].  She double checked her information and it was [facility]. I reviewed the list of ALFs that I gave her a month ago;  [facility]-she said she has not been able to get through. I urged her to continue to try as they frequently have problems with their phones.   [facility]- She said she put her mother on the wait list there.  I encouraged her to call back and check that status and make sure she is still on th wait list.  [facility] gave her the phone number again. She had not called.  [facility] gave her the phone number again. She was not sure she called.  [facility] - I explained is primarily dementia care.    I instructed her to make the calls to the facilities and to please call me back if she needs additional information

## 2022-07-13 NOTE — Telephone Encounter (Signed)
I tried to reach patient's daughter, Page Spiro  [phone] , message left with call back requested.

## 2022-07-16 ENCOUNTER — Telehealth: Payer: Self-pay

## 2022-07-16 ENCOUNTER — Telehealth: Payer: Self-pay | Admitting: Critical Care Medicine

## 2022-07-16 MED ORDER — IPRATROPIUM-ALBUTEROL 0.5-2.5 (3) MG/3ML IN SOLN: 3.0000 mL | Freq: Four times a day (QID) | RESPIRATORY_TRACT | 6 refills | Status: DC

## 2022-07-16 NOTE — Telephone Encounter (Signed)
The patients Budeson (BREZTRI) inhaler has been discontinued and wants to know what to do as far as substitute or alternative medications. Please assist patient further

## 2022-07-16 NOTE — Telephone Encounter (Signed)
Bianca Murray. I put her on Duoneb qid and prn albuterol neb. She is too end stage to use HFA or DPI inhaler anymore

## 2022-07-16 NOTE — Addendum Note (Signed)
Addended by: Daisy Blossom, Annie Main L on: 07/16/2022 03:18 PM   Modules accepted: Orders

## 2022-07-16 NOTE — Telephone Encounter (Signed)
Called and informed her that I sent in [place] for her. She was not aware that this was sent in September.

## 2022-07-16 NOTE — Telephone Encounter (Signed)
Called patient and verified that she has received the free bedside commode  also wanting more aid hours

## 2022-07-16 NOTE — Telephone Encounter (Signed)
Pt called back about inhaler / I advised pt that Lurena Joiner would reach out to her

## 2022-07-16 NOTE — Telephone Encounter (Signed)
Hey Dr. Joya Gaskins,   This patient was on Breztri at one point but could not inspire enough to get the medication in her lungs for benefit. She calls today inquiring about substitutes/additional therapy. Would you need to see her in-person before considering different therapy?

## 2022-07-20 ENCOUNTER — Ambulatory Visit: Payer: Self-pay

## 2022-07-20 NOTE — Telephone Encounter (Signed)
Message from [place] sent at 07/20/2022  1:05 PM EST  Summary: discuss copd   Pt states she has copd and inquiring which stage she is in  Please advise        Attempted to call pt but unable to LM.

## 2022-07-20 NOTE — Telephone Encounter (Signed)
Pt called, got message "call cannot be completed at this time"  Summary: discuss copd   Pt states she has copd and inquiring which stage she is in  Please advise

## 2022-07-20 NOTE — Telephone Encounter (Addendum)
Message from Manuel Garcia sent at 07/20/2022  1:05 PM EST  Summary: discuss copd   Pt states she has copd and inquiring which stage she is in  Please advise        Unable to LM, call cannot be completed as dialed. Third attempt to reach pt. Routing back to office.

## 2022-07-20 NOTE — Telephone Encounter (Signed)
Routing to PCP for review.

## 2022-07-20 NOTE — Telephone Encounter (Signed)
Pt has been informed of PCP response

## 2022-07-20 NOTE — Telephone Encounter (Signed)
She is at stage 4 Copd

## 2022-07-23 ENCOUNTER — Other Ambulatory Visit: Payer: Self-pay | Admitting: Critical Care Medicine

## 2022-07-30 ENCOUNTER — Ambulatory Visit: Payer: Self-pay | Admitting: *Deleted

## 2022-07-30 NOTE — Telephone Encounter (Signed)
Current O2 levels is 97%. Pt is currently at 4 L. She states feels much better. Please advise.

## 2022-07-30 NOTE — Telephone Encounter (Signed)
Keep appt thurs watch for now

## 2022-07-30 NOTE — Telephone Encounter (Signed)
  Chief Complaint: SOB- increasing Symptoms: Patient has chronic bretahing issue- but she is having increased trouble with SOB Frequency: 1 week Pertinent Negatives: Patient denies   Disposition: '[x]'$ ED /'[]'$ Urgent Care (no appt availability in office) / '[]'$ Appointment(In office/virtual)/ '[]'$  [place] [facility]/ '[]'$ Home Care/ '[]'$ Refused Recommended Disposition /'[]'$ [place] [facility]/ '[]'$  Follow-up with PCP Additional Notes: Patient is severely SOB- her O2 sat is 94 - she had to use the bathroom and her recovery from walking was prolonged and she was Out of breath- advised ED now

## 2022-07-30 NOTE — Telephone Encounter (Signed)
Reason for Disposition . [1] MODERATE difficulty breathing (e.g., speaks in phrases, SOB even at rest, pulse 100-120) AND [2] NEW-onset or WORSE than normal  Protocols used: Breathing Difficulty-A-AH  

## 2022-07-30 NOTE — Telephone Encounter (Signed)
Called patient and she stated that she was feeling better and that her oxygen machine was on a lower setting (3) and did adjust it back to 4. She wanted to wait to see if her levels will go back up. I stated to give me call back to let me know how she is doing and that if no changes happen the I would recommend her to the hospital for evaluation. Also made a tele visit with wright on Thursday. Patient verbalized understanding

## 2022-07-30 NOTE — Telephone Encounter (Signed)
Spoke with patient. Patient informed of what Dr.Wright advised . Patient voiced that she continues to feel better. She voiced that she will attend appointment on Thursday and she will continue the monitor 02 sats.

## 2022-08-02 ENCOUNTER — Telehealth: Payer: Self-pay

## 2022-08-02 ENCOUNTER — Ambulatory Visit: Payer: Medicare Other | Attending: Critical Care Medicine | Admitting: Critical Care Medicine

## 2022-08-02 ENCOUNTER — Other Ambulatory Visit: Payer: Self-pay

## 2022-08-02 ENCOUNTER — Encounter: Payer: Self-pay | Admitting: Critical Care Medicine

## 2022-08-02 DIAGNOSIS — Z7409 Other reduced mobility: Secondary | ICD-10-CM | POA: Diagnosis not present

## 2022-08-02 DIAGNOSIS — J9612 Chronic respiratory failure with hypercapnia: Secondary | ICD-10-CM

## 2022-08-02 DIAGNOSIS — Z741 Need for assistance with personal care: Secondary | ICD-10-CM

## 2022-08-02 DIAGNOSIS — I10 Essential (primary) hypertension: Secondary | ICD-10-CM

## 2022-08-02 DIAGNOSIS — J9611 Chronic respiratory failure with hypoxia: Secondary | ICD-10-CM

## 2022-08-02 DIAGNOSIS — J418 Mixed simple and mucopurulent chronic bronchitis: Secondary | ICD-10-CM | POA: Diagnosis not present

## 2022-08-02 NOTE — Assessment & Plan Note (Signed)
End-stage chronic hypoxic respiratory failure based on current saturations increased dose of oxygen to 4-1/2 L continuous

## 2022-08-02 NOTE — Assessment & Plan Note (Signed)
Gold stage D COPD and stage at this time continue nebulized therapy and will give doxycycline for 7 days for bronchitis flare cannot give prednisone due to adverse side effects

## 2022-08-02 NOTE — Progress Notes (Signed)
Established Patient Office Visit  Subjective:  Patient ID: Bianca Murray, female    DOB: 1956/04/29  Age: 66 y.o. MRN: [id number] Virtual Visit via phone note  I connected with Bianca Murray on 08/02/22 at 1030am by a phone enabled telemedicine application and verified that I am speaking with the correct person using two identifiers.   Consent:  I discussed the limitations, risks, security and privacy concerns of performing an evaluation and management service by phone visit and the availability of in person appointments. I also discussed with the patient that there may be a patient responsible charge related to this service. The patient expressed understanding and agreed to proceed.  Location of patient: Patient's at home  Location of provider: I am in my office  Persons participating in the televisit with the patient.    Daughter Bianca Murray on the call as well   History of Present Illness:    CC: Primary care follow-up  HPI 09/28/2021 Bianca Murray presents for primary care follow-up on video.  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  She does tell me she now has her motorized wheelchair and this is doing well inside her home.  Her breathing is at baseline.  She has gotten over the COVID illness she had in the past year.  She needs refills on multiple medications.  There are no primary care gaps active at this time.  She might benefit from a shingles vaccine.  Patient still being visited by the EmT.  11/2021 Patient returns for 43-monthfollow-up with a video visit.  She had an eye exam recently and she has had decreased vision in the left eye she is concerned about this.  She was given Klonopin by the PA McClung 2 weeks ago she was only given 14 pills.  She also has hydroxyzine she states this does help.  She had an issue with Klonopin in the past would like to avoid further Klonopin.  Patient did  receive azithromycin with a video visit with another provider for cough the cough now has resolved.  She is on 4 and half liters of oxygen today her saturation is 95%.  Blood pressure today 135/80.  Pulse is 100.  Patient would like a new handicap sticker.  She needs refills on a variety of her medications.  She would like her hernia and the abdomen examined.  There are no other complaints at this time.  05/08/22 Since the last visit the patient is declining quickly.  She is on 4-1/2 to 5 L oxygen.  She needs help with bathing cleaning her room and now needs help with ambulation.  Her daughter is away during the day working.  She is by herself.  She does have a motorized wheelchair.  She also has a rollator.  She would like help with ambulating a bit around the home.  She is able to manage her own medications.  She states that she cannot use the breztri any longer she cannot take a deep enough breath to get the medication in her lungs.  She does have a home nebulizer only has albuterol for this.  She does have a cough productive of white bubbly mucus.  Her saturations were in 86 to 88% on 4-1/2 L.  There is no chest pain.  She currently has 2 hours 5 days a week Monday through Friday a PCS she is requesting now 6  hours a day 5 days a week.  She would be satisfied with 4 hours a day 5 days a week if she cannot get the 6 hours  10/12 Patient seen in return follow-up by telephone contact she has been concerned about blood pressure most the time is 130/80 but recently 153/90 she just finished a course of prednisone this caused lightheadedness she had to stop early.  She is concerned about her breathlessness getting worse over time.  12/21 Patient is seen by way of a phone visit.  She has severe end-stage COPD and her saturations on 4-1/2 L are 95% any less she is in the 85 to 86% range at home.  She needs an increased dose of her oxygen per [employer].  Patient is wishing to get into the Program for increased hours  from [facility] for home care services we recommended assisted living but the patient has declined this at this time.  She is having increased cough productive of thick mucus.  She is wheezing more.  The daughter says she is having difficulty managing her on just 2 hours 5 times a week with a personal care service. Past Medical History:  Diagnosis Date   Arthritis    Asthma    Cancer ([place])    Chronic respiratory failure with hypoxia and hypercapnia (HCC)    COPD (chronic obstructive pulmonary disease) (HCC)    Hypertension     Past Surgical History:  Procedure Laterality Date   ABDOMINAL HYSTERECTOMY     TRACHEOSTOMY     reversed   TRACHEOSTOMY CLOSURE      Family History  Problem Relation Age of Onset   Hypertension Mother    Heart disease Mother    Cancer Mother    Asthma Father    Asthma Sister    Hypertension Sister    Asthma Brother    Cancer Brother     Social History   Socioeconomic History   Marital status: Single    Spouse name: Not on file   Number of children: Not on file   Years of education: Not on file   Highest education level: Not on file  Occupational History   Not on file  Tobacco Use   Smoking status: Former   Smokeless tobacco: Never   Tobacco comments:    quit smoking in 2018    Vaping Use   Vaping Use: Never used  Substance and Sexual Activity   Alcohol use: Not Currently   Drug use: Not Currently   Sexual activity: Not on file  Other Topics Concern   Not on file  Social History Narrative   Not on file   Social Determinants of Health   Financial Resource Strain: Not on file  Food Insecurity: Not on file  Transportation Needs: Not on file  Physical Activity: Not on file  Stress: Not on file  Social Connections: Not on file  Intimate Partner Violence: Not on file    Outpatient Medications Prior to Visit  Medication Sig Dispense Refill   albuterol (PROVENTIL) (2.5 MG/3ML) 0.083% nebulizer solution Take 3 mLs (2.5 mg total) by  nebulization 3 (three) times daily. 270 mL 1   albuterol (VENTOLIN HFA) 108 (90 Base) MCG/ACT inhaler INHALE 2 PUFFS INTO THE LUNGS EVERY 6 (SIX) HOURS AS NEEDED FOR WHEEZING OR SHORTNESS OF BREATH. 8.5 g 0   amLODipine (NORVASC) 5 MG tablet TAKE 1 TABLET(5 MG) BY MOUTH DAILY 90 tablet 1   atorvastatin (LIPITOR) 10 MG tablet Take 1 tablet (10  mg total) by mouth daily. 90 tablet 3   azelastine (ASTELIN) 0.1 % nasal spray Place 2 sprays into both nostrils 2 (two) times daily. Use in each nostril as directed 30 mL 12   Blood Pressure Monitoring (BLOOD PRESSURE MONITOR AUTOMAT) DEVI Measure blood pressure and pulse daily 1 Device 0   cyclobenzaprine (FLEXERIL) 10 MG tablet TAKE 1 TABLET (10 MG TOTAL) BY MOUTH 3 (THREE) TIMES DAILY AS NEEDED FOR MUSCLE SPASMS. 60 tablet 0   fluticasone (FLONASE) 50 MCG/ACT nasal spray Place 2 sprays into both nostrils daily. 16 g 6   gabapentin (NEURONTIN) 100 MG capsule TAKE 1 CAPSULE (100 MG TOTAL) BY MOUTH 3 (THREE) TIMES DAILY. (AM+NOON+BEDTIME) 90 capsule 3   hydrOXYzine (ATARAX) 25 MG tablet TAKE 1 TABLET (25 MG TOTAL) BY MOUTH 3 (THREE) TIMES DAILY AS NEEDED FOR ANXIETY. 60 tablet 0   ibuprofen (ADVIL) 600 MG tablet TAKE 1 TABLET(600 MG) BY MOUTH EVERY 6 HOURS AS NEEDED 90 tablet 0   ipratropium-albuterol (DUONEB) 0.5-2.5 (3) MG/3ML SOLN Take 3 mLs by nebulization 4 (four) times daily. 360 mL 6   loratadine (CLARITIN) 10 MG tablet TAKE ONE TABLET BY MOUTH ONCE DAILY (AM) 90 tablet 0   losartan (COZAAR) 100 MG tablet Take 1 tablet (100 mg total) by mouth daily. 90 tablet 2   Misc. Devices (PULSE OXIMETER FOR FINGER) MISC 1 application by Does not apply route daily as needed. 1 each 0   Misc. Devices MISC Nebulizer machine, Large shower chair.  Diagnosis- chronic respiratory failure 1 each 0   Misc. Devices MISC Bedside commode. Diagnosis: COPD 1 each 0   OXYGEN 4-5 Liter DAILY (route: Oxygen)     pantoprazole (PROTONIX) 40 MG tablet TAKE 1 TABLET BY MOUTH 2 TIMES  DAILY BEFORE A MEAL (AM+EVENING) 60 tablet 2   polyethylene glycol powder (GLYCOLAX/MIRALAX) 17 GM/SCOOP powder Take 17 g by mouth daily as needed. 507 g 1   promethazine-dextromethorphan (PROMETHAZINE-DM) 6.25-15 MG/5ML syrup Take 5 mLs by mouth 4 (four) times daily as needed. 118 mL 0   sodium chloride (OCEAN) 0.65 % SOLN nasal spray Place 2 sprays into both nostrils as needed for congestion. 60 mL 3   Spacer/Aero-Holding Chambers (AEROCHAMBER MV) inhaler Use as instructed 1 each 0   Vitamin D, Ergocalciferol, (DRISDOL) 1.25 MG (50000 UNIT) CAPS capsule Take 1 capsule (50,000 Units total) by mouth every 7 (seven) days. 12 capsule 1   No facility-administered medications prior to visit.    Allergies  Allergen Reactions   Prednisone Anxiety    ROS Review of Systems  Constitutional: Negative.   HENT:  Negative for ear pain, nosebleeds, postnasal drip, rhinorrhea, sinus pressure, sore throat, trouble swallowing and voice change.   Eyes:  Negative for visual disturbance.  Respiratory:  Positive for shortness of breath and wheezing. Negative for apnea, cough, choking, chest tightness and stridor.        Mucus is bubbles clear  Cardiovascular: Negative.  Negative for chest pain, palpitations and leg swelling.  Gastrointestinal: Negative.  Negative for abdominal distention, abdominal pain, constipation, diarrhea, nausea and vomiting.  Genitourinary: Negative.   Musculoskeletal:  Positive for gait problem. Negative for arthralgias and myalgias.  Skin: Negative.  Negative for rash.  Allergic/Immunologic: Negative.  Negative for environmental allergies and food allergies.  Neurological:  Positive for weakness. Negative for dizziness, syncope and headaches.  Hematological: Negative.  Negative for adenopathy. Does not bruise/bleed easily.  Psychiatric/Behavioral: Negative.  Negative for agitation and sleep disturbance. The patient is  not nervous/anxious.       Objective:    Physical Exam No  exam this is a phone visit There were no vitals taken for this visit. Wt Readings from Last 3 Encounters:  08/24/21 212 lb (96.2 kg)  06/28/21 217 lb (98.4 kg)  01/26/21 225 lb (102.1 kg)     Health Maintenance Due  Topic Date Due   Medicare Annual Wellness (AWV)  Never done   Zoster Vaccines- Shingrix (1 of 2) Never done   INFLUENZA VACCINE  03/13/2022   COVID-19 Vaccine (4 - 2023-24 season) 04/13/2022    There are no preventive care reminders to display for this patient.  No results found for: "TSH" Lab Results  Component Value Date   WBC 7.0 06/28/2021   HGB 11.2 06/28/2021   HCT 34.2 06/28/2021   MCV 86 06/28/2021   PLT 299 06/28/2021   Lab Results  Component Value Date   NA 145 (H) 06/28/2021   K 4.4 06/28/2021   CO2 29 06/28/2021   GLUCOSE 114 (H) 06/28/2021   BUN 11 06/28/2021   CREATININE 0.84 06/28/2021   BILITOT <0.2 06/28/2021   ALKPHOS 80 06/28/2021   AST 13 06/28/2021   ALT 9 06/28/2021   PROT 7.3 06/28/2021   ALBUMIN 4.7 06/28/2021   CALCIUM 9.8 06/28/2021   ANIONGAP 7 11/04/2018   EGFR 77 06/28/2021   Lab Results  Component Value Date   CHOL 343 (H) 06/28/2021   Lab Results  Component Value Date   HDL 135 06/28/2021   Lab Results  Component Value Date   LDLCALC 201 (H) 06/28/2021   Lab Results  Component Value Date   TRIG 61 06/28/2021   Lab Results  Component Value Date   CHOLHDL 2.5 06/28/2021   No results found for: "HGBA1C"    Assessment & Plan:   Problem List Items Addressed This Visit   None No orders of the defined types were placed in this encounter.   Follow Up Instructions:  discussed potential assisted living I discussed the assessment and treatment plan with the patient. The patient was provided an opportunity to ask questions and all were answered. The patient agreed with the plan and demonstrated an understanding of the instructions.   The patient was advised to call back or seek an in-person evaluation if  the symptoms worsen or if the condition fails to improve as anticipated.  I provided 15 minutes of non-face-to-face time during this encounter  including  median intraservice time , review of notes, labs, imaging, medications  and explaining diagnosis and management to the patient .    Asencion Noble, MD

## 2022-08-02 NOTE — Telephone Encounter (Signed)
I spoke to patient's daughter, Bianca Murray.  She has placed her mother on wait list at 2 ALFs and has not been able to reach [facility].  I told her that I would send her contact information to the director at [facility] and have her call.  Bianca Murray was in agreement with that plan.   She was not interested in PACE or having her mother leave the home to attend a day program.  She said her mother would never accept that and not to bother with it.   I explained about the [facility] (CAP) and she was interested in obtaining more information.  I gave her the phone number to call for additional information and to initiate the referral process: [phone].   I also explained about [facility] for additional resources and she was also in agreement to that referral.  I then placed the referral for [id number].   Email sent to Bianca Murray, Administrator/ [facility] requesting she contact [place].

## 2022-08-02 NOTE — Assessment & Plan Note (Signed)
Would benefit from assisted living or long-term care patient declines this was's wishes to get into the Program

## 2022-08-02 NOTE — Assessment & Plan Note (Signed)
No change in medication

## 2022-08-02 NOTE — Telephone Encounter (Signed)
Ty!

## 2022-08-03 ENCOUNTER — Telehealth: Payer: Self-pay | Admitting: *Deleted

## 2022-08-03 ENCOUNTER — Ambulatory Visit: Payer: Self-pay | Admitting: *Deleted

## 2022-08-03 MED ORDER — DOXYCYCLINE HYCLATE 100 MG PO TABS: 100.0000 mg | ORAL_TABLET | Freq: Two times a day (BID) | ORAL | 0 refills | Status: AC

## 2022-08-03 NOTE — Telephone Encounter (Signed)
Pls advise-   Noted in chart at [place] on 08/02/2022 that patient states that she cannot use the breztri any longer she cannot take a deep enough breath to get the medication in her lungs and was taking azithromycin at another video visit.

## 2022-08-03 NOTE — Progress Notes (Signed)
  Care Coordination   Note   08/03/2022 Name: Bianca Murray MRN: [id number] DOB: 28-Jan-1956  Bianca Murray is a 66 y.o. year old female who sees Elsie Stain, MD for primary care. I reached out to [name] by phone today to offer care coordination services.  Ms. Haese was given information about Care Coordination services today including:   The Care Coordination services include support from the care team which includes your Nurse Coordinator, Clinical Social Worker, or Pharmacist.  The Care Coordination team is here to help remove barriers to the health concerns and goals most important to you. Care Coordination services are voluntary, and the patient may decline or stop services at any time by request to their care team member.   Care Coordination Consent Status: Patient agreed to services and verbal consent obtained.   Follow up plan:  Telephone appointment with care coordination team member scheduled for:  08/07/2022  Encounter Outcome:  Pt. Scheduled from referral   Julian Hy, [place] Direct Dial: [phone]

## 2022-08-03 NOTE — Telephone Encounter (Signed)
  Chief Complaint: shortness of breath with exertion. Medication not at pharmacy doxycyline and bretzi inhaler Symptoms: shortness of breath with exertion wears O2 at 4L/min [place]. Last seen in [place] yesterday and told she would be prescribed doxycyline and no prednisone due to SE. Patient reports PCP ordered bretzi inhaler due to nebulizer treatments not effective due to having prednisone in them and she cannot take.  Frequency: yesterday  Pertinent Negatives: Patient denies chest pain no difficulty breathing no fever Disposition: '[]'$ ED /'[]'$ Urgent Care (no appt availability in office) / '[]'$ Appointment(In office/virtual)/ '[]'$  [place] [facility]/ '[]'$ Home Care/ '[]'$ Refused Recommended Disposition /'[]'$ [place] [facility]/ '[x]'$  Follow-up with PCP Additional Notes:   Please advise patient calling due to medication PCP reports he was going to prescribe doxycyline x 7 days and bretzi inhaler not on medication list. Please advise. Medication documented in PCP notes.    Reason for Disposition  [1] MODERATE longstanding difficulty breathing (e.g., speaks in phrases, SOB even at rest, pulse 100-120) AND [2] SAME as normal  Answer Assessment - Initial Assessment Questions 1. RESPIRATORY STATUS: "Describe your breathing?" (e.g., wheezing, shortness of breath, unable to speak, severe coughing)      Shortness of breath , with exertion .  2. ONSET: "When did this breathing problem begin?"      See OV notes from 08/02/22 3. PATTERN "Does the difficult breathing come and go, or has it been constant since it started?"      na 4. SEVERITY: "How bad is your breathing?" (e.g., mild, moderate, severe)    - MILD: No SOB at rest, mild SOB with walking, speaks normally in sentences, can lie down, no retractions, pulse < 100.    - MODERATE: SOB at rest, SOB with minimal exertion and prefers to sit, cannot lie down flat, speaks in phrases, mild retractions, audible wheezing, pulse 100-120.    - SEVERE: Very SOB at rest, speaks  in single words, struggling to breathe, sitting hunched forward, retractions, pulse > 120      Denies shortness of breath at rest  5. RECURRENT SYMPTOM: "Have you had difficulty breathing before?" If Yes, ask: "When was the last time?" and "What happened that time?"      Yes last seen in [place] yesterday  6. CARDIAC HISTORY: "Do you have any history of heart disease?" (e.g., heart attack, angina, bypass surgery, angioplasty)      See hx  7. LUNG HISTORY: "Do you have any history of lung disease?"  (e.g., pulmonary embolus, asthma, emphysema)     COPD wears O2 '@4L'$ /[place] 8. CAUSE: "What do you think is causing the breathing problem?"      Flar up  9. OTHER SYMPTOMS: "Do you have any other symptoms? (e.g., dizziness, runny nose, cough, chest pain, fever)     Shortness of breath with exertion 10. O2 SATURATION MONITOR:  "Do you use an oxygen saturation monitor (pulse oximeter) at home?" If Yes, ask: "What is your reading (oxygen level) today?" "What is your usual oxygen saturation reading?" (e.g., 95%)       96% on O2 at 4L/min  11. PREGNANCY: "Is there any chance you are pregnant?" "When was your last menstrual period?"       na 12. TRAVEL: "Have you traveled out of the country in the last month?" (e.g., travel history, exposures)       na  Protocols used: Breathing Difficulty-A-AH

## 2022-08-03 NOTE — Telephone Encounter (Signed)
Doxycyclene sent   Use nebulizer 5 X a day

## 2022-08-04 DIAGNOSIS — J449 Chronic obstructive pulmonary disease, unspecified: Secondary | ICD-10-CM | POA: Diagnosis not present

## 2022-08-06 DIAGNOSIS — J449 Chronic obstructive pulmonary disease, unspecified: Secondary | ICD-10-CM | POA: Diagnosis not present

## 2022-08-06 DIAGNOSIS — J9610 Chronic respiratory failure, unspecified whether with hypoxia or hypercapnia: Secondary | ICD-10-CM | POA: Diagnosis not present

## 2022-08-07 ENCOUNTER — Ambulatory Visit: Payer: Self-pay

## 2022-08-07 ENCOUNTER — Ambulatory Visit: Payer: Self-pay | Admitting: Licensed Clinical Social Worker

## 2022-08-07 NOTE — Telephone Encounter (Signed)
     Chief Complaint: SOB. Just started antibiotic yesterday. Has not been using nebulizer 5 x day as instructed, states she did not know. Will try that "and see if it helps. O2 sats 96-97%  Symptoms: Above Frequency: Last week Pertinent Negatives: Patient denies fever Disposition: '[]'$ ED /'[]'$ Urgent Care (no appt availability in office) / '[]'$ Appointment(In office/virtual)/ '[]'$  [place] Virtual Care/ '[]'$ Home Care/ '[]'$ Refused Recommended Disposition /'[]'$ [place] [facility]/ '[x]'$  Follow-up with PCP Additional Notes: Will go to ED for worsening of symptoms.  Answer Assessment - Initial Assessment Questions 1. RESPIRATORY STATUS: "Describe your breathing?" (e.g., wheezing, shortness of breath, unable to speak, severe coughing)      SOB 2. ONSET: "When did this breathing problem begin?"      Last week 3. PATTERN "Does the difficult breathing come and go, or has it been constant since it started?"      With exertion 4. SEVERITY: "How bad is your breathing?" (e.g., mild, moderate, severe)    - MILD: No SOB at rest, mild SOB with walking, speaks normally in sentences, can lie down, no retractions, pulse < 100.    - MODERATE: SOB at rest, SOB with minimal exertion and prefers to sit, cannot lie down flat, speaks in phrases, mild retractions, audible wheezing, pulse 100-120.    - SEVERE: Very SOB at rest, speaks in single words, struggling to breathe, sitting hunched forward, retractions, pulse > 120      Moderate 5. RECURRENT SYMPTOM: "Have you had difficulty breathing before?" If Yes, ask: "When was the last time?" and "What happened that time?"      Yes 6. CARDIAC HISTORY: "Do you have any history of heart disease?" (e.g., heart attack, angina, bypass surgery, angioplasty)      No 7. LUNG HISTORY: "Do you have any history of lung disease?"  (e.g., pulmonary embolus, asthma, emphysema)     Yes 8. CAUSE: "What do you think is causing the breathing problem?"      Unsure 9. OTHER SYMPTOMS: "Do you have  any other symptoms? (e.g., dizziness, runny nose, cough, chest pain, fever)     Wheezing 10. O2 SATURATION MONITOR:  "Do you use an oxygen saturation monitor (pulse oximeter) at home?" If Yes, ask: "What is your reading (oxygen level) today?" "What is your usual oxygen saturation reading?" (e.g., 95%)       96% 11. PREGNANCY: "Is there any chance you are pregnant?" "When was your last menstrual period?"       No 12. TRAVEL: "Have you traveled out of the country in the last month?" (e.g., travel history, exposures)       No  Protocols used: Breathing Difficulty-A-AH

## 2022-08-07 NOTE — Telephone Encounter (Signed)
No answer.  Calling to advise patient that Rx were sent to pharmacy. 08/03/2022

## 2022-08-08 ENCOUNTER — Ambulatory Visit: Payer: Self-pay | Admitting: Licensed Clinical Social Worker

## 2022-08-08 NOTE — Patient Outreach (Signed)
  Care Coordination Late Entry  Initial Visit Note   Outreach completed 08/07/22 Name: Bianca Murray MRN: [id number] DOB: 04/17/1956  Bianca Murray is a 66 y.o. year old female who sees Elsie Stain, MD for primary care. I spoke with  Gari Crown adult daughter, Page Spiro,  by phone today.  What matters to the patients health and wellness today?  Ms. Page Spiro requested a call back on 08/08/22. Appt was re-scheduled    SDOH assessments and interventions completed:  No     Care Coordination Interventions:  Yes, provided   Follow up plan: 08/08/22   Encounter Outcome:  Pt. Request to Call Whitney, MSW, [email or web address] Phone [phone] 8:50 AM

## 2022-08-09 NOTE — Telephone Encounter (Signed)
Message received from [place] requesting an FL2 and H&P.  She said she would contact patient's daughter,   I spoke to patient's daughter, Page Spiro and she said she has not been in contact with  [facility].. I told her that we would be sending the requested documentation to [facility] and she was agreeable and said she will wait to hear from the facility.   Dr Joya Gaskins- I have th [facility] for you to sign

## 2022-08-10 NOTE — Patient Outreach (Signed)
  Care Coordination Late Entry  Initial Visit Note   08/10/2022 Name: Bianca Murray MRN: [id number] DOB: 1956-06-15  Bianca Murray is a 66 y.o. year old female who sees Elsie Stain, MD for primary care. I spoke with  [name], adult daughter, Page Spiro by phone today.  What matters to the patients health and wellness today?  Level of Care and Housing    Goals Addressed             This Visit's Progress    Strengthening Support   On track    Care Coordination Interventions: Solution-Focused Strategies employed:  Active listening / Reflection utilized  Emotional Support Provided Caregiver stress acknowledged  Verbalization of feelings encouraged  LCSW spoke with patient's daughter, Page Spiro Patient receives [place] (2 hrs a day five days a week) States pt's aids haven't been consistent noting family recently switched agencies in October. Family has a request to increase hours, this is pending. LCSW received verbal consent to f/up on needs to assist in request LCSW discussed different levels of care. LCSW will provide listing of independent senior living and income-based housing LCSW informed family of CAP program through [place]. Provided contact number Patient is aware of local [facility] (on wait-list)              SDOH assessments and interventions completed:  Yes  SDOH Interventions Today    Flowsheet Row Most Recent Value  SDOH Interventions   Food Insecurity Interventions Intervention Not Indicated  Housing Interventions Intervention Not Indicated  Transportation Interventions Intervention Not Indicated        Care Coordination Interventions:  Yes, provided   Follow up plan: Follow up call scheduled for 2-4 weeks    Encounter Outcome:  Pt. Visit Completed   Christa See, MSW, [email or web address] Phone [phone] 6:51 PM

## 2022-08-10 NOTE — Patient Instructions (Signed)
Visit Information  Thank you for taking time to visit with me today. Please don't hesitate to contact me if I can be of assistance to you.   Following are the goals we discussed today:   Goals Addressed             This Visit's Progress    Strengthening Support   On track    Care Coordination Interventions: Solution-Focused Strategies employed:  Active listening / Reflection utilized  Emotional Support Provided Caregiver stress acknowledged  Verbalization of feelings encouraged  LCSW spoke with patient's daughter, Bianca Murray Patient receives [place] (2 hrs a day five days a week) States pt's aids haven't been consistent noting family recently switched agencies in October. Family has a request to increase hours, this is pending. LCSW received verbal consent to f/up on needs to assist in request LCSW discussed different levels of care. LCSW will provide listing of independent senior living and income-based housing LCSW informed family of CAP program through [place]. Provided contact number Patient is aware of local [facility] (on wait-list)              Our next appointment is by telephone on 08/24/22 at 1 PM  Please call the care guide team at [phone] if you need to cancel or reschedule your appointment.   If you are experiencing a Mental Health or [place] or need someone to talk to, please call the Suicide and Crisis Lifeline: 988 call 911   Patient verbalizes understanding of instructions and care plan provided today and agrees to view in [place]. Active MyChart status and patient understanding of how to access instructions and care plan via MyChart confirmed with patient.     Christa See, MSW, [email or web address] Phone [phone] 6:52 PM

## 2022-08-21 NOTE — Telephone Encounter (Signed)
I spoke to Clelia Schaumann, [place] and she stated that she left a message for patient's daughter; but has not heard back from her.

## 2022-08-23 ENCOUNTER — Ambulatory Visit: Payer: Self-pay

## 2022-08-23 ENCOUNTER — Other Ambulatory Visit: Payer: Self-pay | Admitting: Critical Care Medicine

## 2022-08-23 NOTE — Telephone Encounter (Signed)
3rd attempt, pt called, unable to LVM, call cannot be completed at this time... will route to practice for review.

## 2022-08-23 NOTE — Telephone Encounter (Signed)
Summary: cold like symptoms / rx req   The patient shares that they are concerned that they may be about to experience respiratory discomfort and a possible cold  The patient shares that they have experienced a cough, back discomfort, and sneezing  The patient would like to be prescribed something for their discomfort  The patient shares that they have a history of COPD concerns and have 1 functioning lung  Please contact the patient further when possible         2nd attempt to contact patient on [phone] and no answer, recording , call can not be completed at this time, hang up call again later.

## 2022-08-23 NOTE — Telephone Encounter (Signed)
Virtual appointment scheduled on 08/24/2022 with Dr. Wynetta Emery.

## 2022-08-23 NOTE — Telephone Encounter (Signed)
Pt called, received message "call cannot be completed at this time..."  Summary: cold like symptoms / rx req   The patient shares that they are concerned that they may be about to experience respiratory discomfort and a possible cold  The patient shares that they have experienced a cough, back discomfort, and sneezing  The patient would like to be prescribed something for their discomfort  The patient shares that they have a history of COPD concerns and have 1 functioning lung  Please contact the patient further when possible

## 2022-08-24 ENCOUNTER — Ambulatory Visit: Payer: Self-pay | Admitting: Licensed Clinical Social Worker

## 2022-08-24 ENCOUNTER — Ambulatory Visit: Payer: 59 | Attending: Internal Medicine | Admitting: Internal Medicine

## 2022-08-24 DIAGNOSIS — J449 Chronic obstructive pulmonary disease, unspecified: Secondary | ICD-10-CM

## 2022-08-24 DIAGNOSIS — J418 Mixed simple and mucopurulent chronic bronchitis: Secondary | ICD-10-CM

## 2022-08-24 MED ORDER — BENZONATATE 100 MG PO CAPS: 100.0000 mg | ORAL_CAPSULE | Freq: Two times a day (BID) | ORAL | 0 refills | Status: DC | PRN

## 2022-08-24 MED ORDER — AZITHROMYCIN 250 MG PO TABS: ORAL_TABLET | ORAL | 0 refills | Status: DC

## 2022-08-24 NOTE — Progress Notes (Signed)
Virtual Visit via Video Note  I connected with Bianca Murray on 08/24/2022 at 1:43 PM by a video enabled telemedicine application and verified that I am speaking with the correct person using two identifiers.  Location: Patient: home Provider: Office   Patient and her caregiver RaTonia participated in this visit. I discussed the limitations of evaluation and management by telemedicine and the availability of in person appointments. The patient expressed understanding and agreed to proceed.  History of Present Illness: Patient with history of of end-stage COPD on home O2, HTN.  This was an urgent care visit. Patient was having some difficulty hearing me so her caregiver assisted with the history.  Patient complains of having a cold since this past weekend.  Symptoms include sneezing, runny nose and increased cough productive of green phlegm.  She has not had any fever or worsening shortness of breath.  She is on home O2.  She states that her PCP usually prescribes antibiotic to prevent things from progressing.  Did home COVID test when symptoms started and it was negative.  Reports that she has been using Flonase and Claritin   Outpatient Encounter Medications as of 08/24/2022  Medication Sig Note   albuterol (PROVENTIL) (2.5 MG/3ML) 0.083% nebulizer solution Take 3 mLs (2.5 mg total) by nebulization 3 (three) times daily.    albuterol (VENTOLIN HFA) 108 (90 Base) MCG/ACT inhaler INHALE 2 PUFFS INTO THE LUNGS EVERY 6 (SIX) HOURS AS NEEDED FOR WHEEZING OR SHORTNESS OF BREATH.    amLODipine (NORVASC) 5 MG tablet TAKE 1 TABLET(5 MG) BY MOUTH DAILY    atorvastatin (LIPITOR) 10 MG tablet Take 1 tablet (10 mg total) by mouth daily.    azelastine (ASTELIN) 0.1 % nasal spray Place 2 sprays into both nostrils 2 (two) times daily. Use in each nostril as directed    Blood Pressure Monitoring (BLOOD PRESSURE MONITOR AUTOMAT) DEVI Measure blood pressure and pulse daily    cyclobenzaprine (FLEXERIL) 10 MG  tablet TAKE 1 TABLET (10 MG TOTAL) BY MOUTH 3 (THREE) TIMES DAILY AS NEEDED FOR MUSCLE SPASMS.    fluticasone (FLONASE) 50 MCG/ACT nasal spray Place 2 sprays into both nostrils daily.    gabapentin (NEURONTIN) 100 MG capsule TAKE 1 CAPSULE (100 MG TOTAL) BY MOUTH 3 (THREE) TIMES DAILY. (AM+NOON+BEDTIME)    hydrOXYzine (ATARAX) 25 MG tablet TAKE 1 TABLET (25 MG TOTAL) BY MOUTH 3 (THREE) TIMES DAILY AS NEEDED FOR ANXIETY.    ibuprofen (ADVIL) 600 MG tablet TAKE 1 TABLET(600 MG) BY MOUTH EVERY 6 HOURS AS NEEDED    ipratropium-albuterol (DUONEB) 0.5-2.5 (3) MG/3ML SOLN Take 3 mLs by nebulization 4 (four) times daily.    loratadine (CLARITIN) 10 MG tablet TAKE ONE TABLET BY MOUTH ONCE DAILY (AM)    losartan (COZAAR) 100 MG tablet Take 1 tablet (100 mg total) by mouth daily.    Misc. Devices (PULSE OXIMETER FOR FINGER) MISC 1 application by Does not apply route daily as needed.    Misc. Devices MISC Nebulizer machine, Large shower chair.  Diagnosis- chronic respiratory failure    Misc. Devices MISC Bedside commode. Diagnosis: COPD    OXYGEN 4-5 Liter DAILY (route: Oxygen) 05/08/2022: Med Classification: Medical Oxygen   pantoprazole (PROTONIX) 40 MG tablet TAKE 1 TABLET BY MOUTH 2 TIMES DAILY BEFORE A MEAL (AM+EVENING)    polyethylene glycol powder (GLYCOLAX/MIRALAX) 17 GM/SCOOP powder Take 17 g by mouth daily as needed.    promethazine-dextromethorphan (PROMETHAZINE-DM) 6.25-15 MG/5ML syrup Take 5 mLs by mouth 4 (four) times daily as  needed.    sodium chloride (OCEAN) 0.65 % SOLN nasal spray Place 2 sprays into both nostrils as needed for congestion.    Spacer/Aero-Holding Chambers (AEROCHAMBER MV) inhaler Use as instructed    Vitamin D, Ergocalciferol, (DRISDOL) 1.25 MG (50000 UNIT) CAPS capsule Take 1 capsule (50,000 Units total) by mouth every 7 (seven) days.    No facility-administered encounter medications on file as of 08/24/2022.    Observations/Objective: Elderly female sitting in chair with  her oxygen on.  She appears in no acute distress.  Assessment and Plan: 1. Mixed simple and mucopurulent chronic bronchitis (HCC) - benzonatate (TESSALON) 100 MG capsule; Take 1 capsule (100 mg total) by mouth 2 (two) times daily as needed for cough.  Dispense: 20 capsule; Refill: 0 - azithromycin (ZITHROMAX Z-PAK) 250 MG tablet; 2 tabs PO x1 then 1 tab PO daily.  Dispense: 6 each; Refill: 0  2. Chronic obstructive pulmonary disease, unspecified COPD type ([place])    Follow Up Instructions: PRN   I discussed the assessment and treatment plan with the patient. The patient was provided an opportunity to ask questions and all were answered. The patient agreed with the plan and demonstrated an understanding of the instructions.   The patient was advised to call back or seek an in-person evaluation if the symptoms worsen or if the condition fails to improve as anticipated.  I spent 11 minutes dedicated to the care of this patient on the date of this encounter to include previsit review of of patient's chart, face-to-face time with patient and her caregiver and postvisit entering of orders.  This note has been created with Surveyor, quantity. Any transcriptional errors are unintentional.  Karle Plumber, MD

## 2022-08-27 NOTE — Patient Instructions (Signed)
Visit Information  Thank you for taking time to visit with me today. Please don't hesitate to contact me if I can be of assistance to you.   Following are the goals we discussed today:   Goals Addressed             This Visit's Progress    Strengthening Support   On track    Care Coordination Interventions: Solution-Focused Strategies employed:  Active listening / Reflection utilized  Emotional Support Provided Caregiver stress acknowledged  Verbalization of feelings encouraged  LCSW spoke with patient's daughter, Bianca Murray Patient receives [place] (2 hrs a day five days a week) States pt's aids haven't been consistent noting family recently switched agencies in October. Family has a request to increase hours, this is pending. LCSW received verbal consent to f/up on needs to assist in request LCSW discussed different levels of care. LCSW will provide listing of independent senior living and income-based housing LCSW informed family of CAP program through [place]. Provided contact number Patient is aware of local [facility] (on wait-list)              Our next appointment is by telephone on 09/21/22 at 3:30 PM  Please call the care guide team at [phone] if you need to cancel or reschedule your appointment.   If you are experiencing a Mental Health or [place] or need someone to talk to, please call the Suicide and Crisis Lifeline: 988 call 911   Patient verbalizes understanding of instructions and care plan provided today and agrees to view in [place]. Active MyChart status and patient understanding of how to access instructions and care plan via MyChart confirmed with patient.     Bianca Murray, MSW, [email or web address] Phone [phone] 6:28 PM

## 2022-08-27 NOTE — Patient Outreach (Signed)
  Care Coordination Late Entry  Follow Up Visit Note   Outreach completed on 08/24/22 Name: Bianca Murray MRN: [id number] DOB: [birth year]  Bianca Murray is a 67 y.o. year old female who sees Elsie Stain, MD for primary care. I spoke with  G[place] Crown daughter, Page Spiro, by phone today.  What matters to the patients health and wellness today?  Supportive Resources    Goals Addressed             This Visit's Progress    Strengthening Support   On track    Care Coordination Interventions: Solution-Focused Strategies employed:  Active listening / Reflection utilized  Emotional Support Provided Caregiver stress acknowledged  Verbalization of feelings encouraged  LCSW spoke with patient's daughter, Page Spiro Patient receives [place] (2 hrs a day five days a week) States pt's aids haven't been consistent noting family recently switched agencies in October. Family has a request to increase hours, this is pending. LCSW received verbal consent to f/up on needs to assist in request LCSW discussed different levels of care. LCSW will provide listing of independent senior living and income-based housing LCSW informed family of CAP program through [place]. Provided contact number Patient is aware of local [facility] (on wait-list)              SDOH assessments and interventions completed:  No     Care Coordination Interventions:  Yes, provided   Follow up plan: Follow up call scheduled for 4-6 weeks    Encounter Outcome:  Pt. Visit Completed   Christa See, MSW, [email or web address] Phone [phone] 6:27 PM

## 2022-08-28 ENCOUNTER — Other Ambulatory Visit: Payer: Self-pay | Admitting: Critical Care Medicine

## 2022-08-28 NOTE — Telephone Encounter (Signed)
Requested medication (s) are due for refill today: yes  Requested medication (s) are on the active medication list: yes  Last refill:  04/05/22 #90 0 refills  Future visit scheduled: yes in 3 weeks   Notes to clinic:  protocol failed last labs 06/28/21. No refills remain. Do you want to refill Rx?     Requested Prescriptions  Pending Prescriptions Disp Refills   ibuprofen (ADVIL) 600 MG tablet [Pharmacy Med Name: IBUPROFEN 600 MG ORAL TABLET] 90 tablet 0    Sig: TAKE 1 TABLET (600 MG) BY MOUTH EVERY 6 HOURS AS NEEDED     Analgesics:  NSAIDS Failed - 08/28/2022 11:02 AM      Failed - Manual Review: Labs are only required if the patient has taken medication for more than 8 weeks.      Failed - Cr in normal range and within 360 days    Creatinine, Ser  Date Value Ref Range Status  06/28/2021 0.84 0.57 - 1.00 mg/dL Final         Failed - HGB in normal range and within 360 days    Hemoglobin  Date Value Ref Range Status  06/28/2021 11.2 11.1 - 15.9 g/dL Final         Failed - PLT in normal range and within 360 days    Platelets  Date Value Ref Range Status  06/28/2021 299 150 - 450 x10E3/uL Final         Failed - HCT in normal range and within 360 days    Hematocrit  Date Value Ref Range Status  06/28/2021 34.2 34.0 - 46.6 % Final         Failed - eGFR is 30 or above and within 360 days    GFR calc Af Amer  Date Value Ref Range Status  11/04/2018 >60 >60 mL/min Final   GFR calc non Af Amer  Date Value Ref Range Status  11/04/2018 >60 >60 mL/min Final   eGFR  Date Value Ref Range Status  06/28/2021 77 >59 mL/min/1.73 Final         Passed - Patient is not pregnant      Passed - Valid encounter within last 12 months    Recent Outpatient Visits           4 days ago Mixed simple and mucopurulent chronic bronchitis ([place])   [place] Karle Plumber B, MD   3 weeks ago Chronic respiratory failure with hypoxia and hypercapnia ([place])    [place] Elsie Stain, MD   3 months ago Chronic respiratory failure with hypoxia and hypercapnia [facility])   [place] Elsie Stain, MD   3 months ago Mixed simple and mucopurulent chronic bronchitis [facility])   [place] Elsie Stain, MD   8 months ago Chronic respiratory failure with hypoxia and hypercapnia [facility])   [place], MD       Future Appointments             In 3 weeks Elsie Stain, MD [place]

## 2022-08-29 ENCOUNTER — Telehealth: Payer: Self-pay

## 2022-08-29 NOTE — Telephone Encounter (Signed)
Spoke with [name] , patient's  daughter as to where she was in finding assist living facility for her Mother. [name] voiced that she has tabled her pursuit for now , due to health issues of her own. [name] mentioned speaking with someone at [place] , [place]. . She does not want to make any decision until she visits the facility. Informed [name] that if she or her Mother had any questions that we would be glad to help and guide her through this process. But we could move forward  in finding placement for her Mother until she let us know with facility she has chosen.

## 2022-09-03 ENCOUNTER — Ambulatory Visit: Payer: Self-pay | Admitting: *Deleted

## 2022-09-03 NOTE — Telephone Encounter (Signed)
Called [phone] again and can not be completed at this time. Called patient's daughter [phone] in attempts to contact patient. No answer. LVMTCB [phone].

## 2022-09-03 NOTE — Telephone Encounter (Signed)
Summary: Pt requests Rx for cough syrup   Pt requests that a Rx for cough syrup be sent to [place], [place]. Pt had an appt on 08/24/22. Cb# [id number]     Called patient on [phone] and recording is call can not be completed as dialed. Attempted x 2. Will review chart and try another # listed  to review sx of cough and medication request.

## 2022-09-03 NOTE — Telephone Encounter (Signed)
Called patient on # [phone], no answer, LVMTCB [phone]. Called [phone] again and recording call can not be completed at this time. Please advise regarding medication request.

## 2022-09-05 ENCOUNTER — Telehealth: Payer: Self-pay | Admitting: Critical Care Medicine

## 2022-09-05 NOTE — Telephone Encounter (Signed)
Copied from [place] [phone]. Topic: Appointment Scheduling - Scheduling Inquiry for Clinic >> Sep 05, 2022 11:09 AM Cyndi Bender wrote: Reason for CRM: Pt daughter Page Spiro requests that Dr. Joya Gaskins return her call asap because she is concerned about pt scheduling virtual appts instead in office appts. Cb# [phone]

## 2022-09-07 ENCOUNTER — Ambulatory Visit: Payer: Self-pay

## 2022-09-07 NOTE — Telephone Encounter (Signed)
This encounter was created in error - please disregard.

## 2022-09-07 NOTE — Telephone Encounter (Signed)
  Chief Complaint: Medication refill Symptoms: cough Frequency:  Pertinent Negatives: Patient denies  Disposition: '[]'$ ED /'[]'$ Urgent Care (no appt availability in office) / '[]'$ Appointment(In office/virtual)/ '[]'$  [place] [facility]/ '[]'$ Home Care/ '[]'$ Refused Recommended Disposition /'[]'$ [place] [facility]/ '[x]'$  Follow-up with PCP Additional Notes: Pt would like a refill of antibiotics. Pt states she feels better, still has a little cough. Pt does not want appt., just more antibiotics.  Please advise.    Reason for Disposition  [1] Prescription refill request for ESSENTIAL medicine (i.e., likelihood of harm to patient if not taken) AND [2] triager unable to refill per department policy  Answer Assessment - Initial Assessment Questions 1. DRUG NAME: "What medicine do you need to have refilled?"     Antibiotics 2. REFILLS REMAINING: "How many refills are remaining?" (Note: The label on the medicine or pill bottle will show how many refills are remaining. If there are no refills remaining, then a renewal may be needed.)      3. EXPIRATION DATE: "What is the expiration date?" (Note: The label states when the prescription will expire, and thus can no longer be refilled.)      4. PRESCRIBING HCP: "Who prescribed it?" Reason: If prescribed by specialist, call should be referred to that group.      5. SYMPTOMS: "Do you have any symptoms?"     Cough 6. PREGNANCY: "Is there any chance that you are pregnant?" "When was your last menstrual period?"  Protocols used: Medication Refill and Renewal Call-A-AH

## 2022-09-08 NOTE — Telephone Encounter (Signed)
Have pt keep Feb appt and stay on current medications for now

## 2022-09-10 NOTE — Telephone Encounter (Signed)
Pt called back and given the message from Dr. Joya Gaskins, [place] verbalized understanding.

## 2022-09-10 NOTE — Telephone Encounter (Signed)
Noted  

## 2022-09-10 NOTE — Telephone Encounter (Signed)
This encounter was created in error - please disregard.

## 2022-09-10 NOTE — Telephone Encounter (Signed)
Call placed to patient per Dr. Joya Gaskins  Patient is to keep Feb appt and stay on current medications for now . Unable to reach or leave message on VM.

## 2022-09-17 NOTE — Telephone Encounter (Signed)
Called patient no changes made to upcoming appointment, patient has ov in may and we both agreed to keep that

## 2022-09-20 ENCOUNTER — Ambulatory Visit: Payer: 59 | Attending: Critical Care Medicine | Admitting: Critical Care Medicine

## 2022-09-20 ENCOUNTER — Encounter: Payer: Self-pay | Admitting: Critical Care Medicine

## 2022-09-20 DIAGNOSIS — I10 Essential (primary) hypertension: Secondary | ICD-10-CM

## 2022-09-20 DIAGNOSIS — J9611 Chronic respiratory failure with hypoxia: Secondary | ICD-10-CM | POA: Diagnosis not present

## 2022-09-20 DIAGNOSIS — J418 Mixed simple and mucopurulent chronic bronchitis: Secondary | ICD-10-CM | POA: Diagnosis not present

## 2022-09-20 DIAGNOSIS — J9612 Chronic respiratory failure with hypercapnia: Secondary | ICD-10-CM

## 2022-09-20 MED ORDER — LOSARTAN POTASSIUM 100 MG PO TABS: 100.0000 mg | ORAL_TABLET | Freq: Every day | ORAL | 2 refills | Status: DC

## 2022-09-20 MED ORDER — AMLODIPINE BESYLATE 5 MG PO TABS: ORAL_TABLET | ORAL | 1 refills | Status: DC

## 2022-09-20 MED ORDER — CEFDINIR 300 MG PO CAPS: 300.0000 mg | ORAL_CAPSULE | Freq: Two times a day (BID) | ORAL | 0 refills | Status: DC

## 2022-09-20 MED ORDER — VITAMIN D (ERGOCALCIFEROL) 1.25 MG (50000 UNIT) PO CAPS: 50000.0000 [IU] | ORAL_CAPSULE | ORAL | 1 refills | Status: AC

## 2022-09-20 MED ORDER — GABAPENTIN 100 MG PO CAPS: 100.0000 mg | ORAL_CAPSULE | Freq: Three times a day (TID) | ORAL | 3 refills | Status: AC

## 2022-09-20 MED ORDER — ATORVASTATIN CALCIUM 10 MG PO TABS: 10.0000 mg | ORAL_TABLET | Freq: Every day | ORAL | 3 refills | Status: AC

## 2022-09-20 MED ORDER — PANTOPRAZOLE SODIUM 40 MG PO TBEC: DELAYED_RELEASE_TABLET | ORAL | 2 refills | Status: DC

## 2022-09-20 MED ORDER — HYDROXYZINE HCL 25 MG PO TABS: 25.0000 mg | ORAL_TABLET | Freq: Three times a day (TID) | ORAL | 0 refills | Status: DC | PRN

## 2022-09-20 MED ORDER — ALBUTEROL SULFATE (2.5 MG/3ML) 0.083% IN NEBU: 2.5000 mg | INHALATION_SOLUTION | Freq: Three times a day (TID) | RESPIRATORY_TRACT | 1 refills | Status: DC

## 2022-09-20 MED ORDER — CYCLOBENZAPRINE HCL 10 MG PO TABS: 10.0000 mg | ORAL_TABLET | Freq: Three times a day (TID) | ORAL | 0 refills | Status: DC | PRN

## 2022-09-20 NOTE — Assessment & Plan Note (Signed)
No change in medications

## 2022-09-20 NOTE — Assessment & Plan Note (Signed)
Continue with DuoNeb 4 times daily and will refill as needed albuterol For acute bronchitis will give a short course of cefdinir

## 2022-09-20 NOTE — Progress Notes (Signed)
Established Patient Office Visit  Subjective:  Patient ID: Bianca Murray, female    DOB: 1956-04-25  Age: 67 y.o. MRN: [id number] Virtual Visit via phone note  I connected with Bianca Murray on 09/20/22 at 1030am by a phone enabled telemedicine application and verified that I am speaking with the correct person using two identifiers.   Consent:  I discussed the limitations, risks, security and privacy concerns of performing an evaluation and management service by phone visit and the availability of in person appointments. I also discussed with the patient that there may be a patient responsible charge related to this service. The patient expressed understanding and agreed to proceed.  Location of patient: Patient's at home  Location of provider: I am in my office  Persons participating in the televisit with the patient.    No one else on the call History of Present Illness:    CC: Primary care follow-up  HPI 09/28/2021 Bianca Murray presents for primary care follow-up on video.  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  She does tell me she now has her motorized wheelchair and this is doing well inside her home.  Her breathing is at baseline.  She has gotten over the COVID illness she had in the past year.  She needs refills on multiple medications.  There are no primary care gaps active at this time.  She might benefit from a shingles vaccine.  Patient still being visited by the EmT.  11/2021 Patient returns for 25-monthfollow-up with a video visit.  She had an eye exam recently and she has had decreased vision in the left eye she is concerned about this.  She was given Klonopin by the PA McClung 2 weeks ago she was only given 14 pills.  She also has hydroxyzine she states this does help.  She had an issue with Klonopin in the past would like to avoid further Klonopin.  Patient did receive  azithromycin with a video visit with another provider for cough the cough now has resolved.  She is on 4 and half liters of oxygen today her saturation is 95%.  Blood pressure today 135/80.  Pulse is 100.  Patient would like a new handicap sticker.  She needs refills on a variety of her medications.  She would like her hernia and the abdomen examined.  There are no other complaints at this time.  05/08/22 Since the last visit the patient is declining quickly.  She is on 4-1/2 to 5 L oxygen.  She needs help with bathing cleaning her room and now needs help with ambulation.  Her daughter is away during the day working.  She is by herself.  She does have a motorized wheelchair.  She also has a rollator.  She would like help with ambulating a bit around the home.  She is able to manage her own medications.  She states that she cannot use the breztri any longer she cannot take a deep enough breath to get the medication in her lungs.  She does have a home nebulizer only has albuterol for this.  She does have a cough productive of white bubbly mucus.  Her saturations were in 86 to 88% on 4-1/2 L.  There is no chest pain.  She currently has 2 hours 5 days a week Monday through Friday a PCS she is requesting now 6 hours a day  5 days a week.  She would be satisfied with 4 hours a day 5 days a week if she cannot get the 6 hours  10/12 Patient seen in return follow-up by telephone contact she has been concerned about blood pressure most the time is 130/80 but recently 153/90 she just finished a course of prednisone this caused lightheadedness she had to stop early.  She is concerned about her breathlessness getting worse over time.  12/21 Patient is seen by way of a phone visit.  She has severe end-stage COPD and her saturations on 4-1/2 L are 95% any less she is in the 85 to 86% range at home.  She needs an increased dose of her oxygen per [employer].  Patient is wishing to get into the Program for increased hours from  [facility] for home care services we recommended assisted living but the patient has declined this at this time.  She is having increased cough productive of thick mucus.  She is wheezing more.  The daughter says she is having difficulty managing her on just 2 hours 5 times a week with a personal care service.  09/20/22 Patient seen in return follow-up on a telephone visit she states she is having more cough more congestion.  She does not oximetry check while on the phone she is 98% on 4 L heart rate 101.  She is inquiring if she can get the Program.  She is having dry skin.  Breathing is continuing to decline.  She is still giving consideration to assisted living Past Medical History:  Diagnosis Date   Arthritis    Asthma    Cancer ([place])    Chronic respiratory failure with hypoxia and hypercapnia (HCC)    COPD (chronic obstructive pulmonary disease) (HCC)    Hypertension     Past Surgical History:  Procedure Laterality Date   ABDOMINAL HYSTERECTOMY     TRACHEOSTOMY     reversed   TRACHEOSTOMY CLOSURE      Family History  Problem Relation Age of Onset   Hypertension Mother    Heart disease Mother    Cancer Mother    Asthma Father    Asthma Sister    Hypertension Sister    Asthma Brother    Cancer Brother     Social History   Socioeconomic History   Marital status: Single    Spouse name: Not on file   Number of children: Not on file   Years of education: Not on file   Highest education level: Not on file  Occupational History   Not on file  Tobacco Use   Smoking status: Former   Smokeless tobacco: Never   Tobacco comments:    quit smoking in 2018    Vaping Use   Vaping Use: Never used  Substance and Sexual Activity   Alcohol use: Not Currently   Drug use: Not Currently   Sexual activity: Not on file  Other Topics Concern   Not on file  Social History Narrative   Not on file   Social Determinants of Health   Financial Resource Strain: Not on file  Food  Insecurity: No Food Insecurity (08/08/2022)   Hunger Vital Sign    Worried About Running Out of Food in the Last Year: Never true    Ran Out of Food in the Last Year: Never true  Transportation Needs: No Transportation Needs (08/08/2022)   PRAPARE - Transportation    Lack of Transportation (Medical): No    Lack of  Transportation (Non-Medical): No  Physical Activity: Not on file  Stress: Not on file  Social Connections: Not on file  Intimate Partner Violence: Not on file    Outpatient Medications Prior to Visit  Medication Sig Dispense Refill   albuterol (PROVENTIL) (2.5 MG/3ML) 0.083% nebulizer solution Take 3 mLs (2.5 mg total) by nebulization 3 (three) times daily. 270 mL 1   albuterol (VENTOLIN HFA) 108 (90 Base) MCG/ACT inhaler INHALE 2 PUFFS INTO THE LUNGS EVERY 6 (SIX) HOURS AS NEEDED FOR WHEEZING OR SHORTNESS OF BREATH. 8.5 g 0   amLODipine (NORVASC) 5 MG tablet TAKE 1 TABLET(5 MG) BY MOUTH DAILY 90 tablet 1   atorvastatin (LIPITOR) 10 MG tablet Take 1 tablet (10 mg total) by mouth daily. 90 tablet 3   azelastine (ASTELIN) 0.1 % nasal spray Place 2 sprays into both nostrils 2 (two) times daily. Use in each nostril as directed 30 mL 12   benzonatate (TESSALON) 100 MG capsule Take 1 capsule (100 mg total) by mouth 2 (two) times daily as needed for cough. 20 capsule 0   Blood Pressure Monitoring (BLOOD PRESSURE MONITOR AUTOMAT) DEVI Measure blood pressure and pulse daily 1 Device 0   cyclobenzaprine (FLEXERIL) 10 MG tablet TAKE 1 TABLET (10 MG TOTAL) BY MOUTH 3 (THREE) TIMES DAILY AS NEEDED FOR MUSCLE SPASMS. 60 tablet 0   fluticasone (FLONASE) 50 MCG/ACT nasal spray Place 2 sprays into both nostrils daily. 16 g 6   gabapentin (NEURONTIN) 100 MG capsule TAKE 1 CAPSULE (100 MG TOTAL) BY MOUTH 3 (THREE) TIMES DAILY. (AM+NOON+BEDTIME) 90 capsule 3   hydrOXYzine (ATARAX) 25 MG tablet TAKE 1 TABLET (25 MG TOTAL) BY MOUTH 3 (THREE) TIMES DAILY AS NEEDED FOR ANXIETY. 60 tablet 0   ibuprofen  (ADVIL) 600 MG tablet TAKE 1 TABLET (600 MG) BY MOUTH EVERY 6 HOURS AS NEEDED 90 tablet 0   ipratropium-albuterol (DUONEB) 0.5-2.5 (3) MG/3ML SOLN Take 3 mLs by nebulization 4 (four) times daily. 360 mL 6   loratadine (CLARITIN) 10 MG tablet TAKE ONE TABLET BY MOUTH ONCE DAILY (AM) 90 tablet 1   losartan (COZAAR) 100 MG tablet Take 1 tablet (100 mg total) by mouth daily. 90 tablet 2   Misc. Devices (PULSE OXIMETER FOR FINGER) MISC 1 application by Does not apply route daily as needed. 1 each 0   Misc. Devices MISC Nebulizer machine, Large shower chair.  Diagnosis- chronic respiratory failure 1 each 0   Misc. Devices MISC Bedside commode. Diagnosis: COPD 1 each 0   OXYGEN 4-5 Liter DAILY (route: Oxygen)     pantoprazole (PROTONIX) 40 MG tablet TAKE 1 TABLET BY MOUTH 2 TIMES DAILY BEFORE A MEAL (AM+EVENING) 60 tablet 2   polyethylene glycol powder (GLYCOLAX/MIRALAX) 17 GM/SCOOP powder Take 17 g by mouth daily as needed. 507 g 1   promethazine-dextromethorphan (PROMETHAZINE-DM) 6.25-15 MG/5ML syrup Take 5 mLs by mouth 4 (four) times daily as needed. 118 mL 0   sodium chloride (OCEAN) 0.65 % SOLN nasal spray Place 2 sprays into both nostrils as needed for congestion. 60 mL 3   Spacer/Aero-Holding Chambers (AEROCHAMBER MV) inhaler Use as instructed 1 each 0   Vitamin D, Ergocalciferol, (DRISDOL) 1.25 MG (50000 UNIT) CAPS capsule Take 1 capsule (50,000 Units total) by mouth every 7 (seven) days. 12 capsule 1   azithromycin (ZITHROMAX Z-PAK) 250 MG tablet 2 tabs PO x1 then 1 tab PO daily. 6 each 0   No facility-administered medications prior to visit.    Allergies  Allergen Reactions  Prednisone Anxiety    ROS Review of Systems  Constitutional: Negative.   HENT:  Negative for ear pain, nosebleeds, postnasal drip, rhinorrhea, sinus pressure, sore throat, trouble swallowing and voice change.   Eyes:  Negative for visual disturbance.  Respiratory:  Positive for shortness of breath and wheezing.  Negative for apnea, cough, choking, chest tightness and stridor.        Mucus is bubbles clear  Cardiovascular: Negative.  Negative for chest pain, palpitations and leg swelling.  Gastrointestinal: Negative.  Negative for abdominal distention, abdominal pain, constipation, diarrhea, nausea and vomiting.  Genitourinary: Negative.   Musculoskeletal:  Positive for gait problem. Negative for arthralgias and myalgias.  Skin: Negative.  Negative for rash.  Allergic/Immunologic: Negative.  Negative for environmental allergies and food allergies.  Neurological:  Positive for weakness. Negative for dizziness, syncope and headaches.  Hematological: Negative.  Negative for adenopathy. Does not bruise/bleed easily.  Psychiatric/Behavioral: Negative.  Negative for agitation and sleep disturbance. The patient is not nervous/anxious.       Objective:    Physical Exam No exam this is a phone visit There were no vitals taken for this visit. Wt Readings from Last 3 Encounters:  08/24/21 212 lb (96.2 kg)  06/28/21 217 lb (98.4 kg)  01/26/21 225 lb (102.1 kg)     Health Maintenance Due  Topic Date Due   Medicare Annual Wellness (AWV)  Never done   Zoster Vaccines- Shingrix (1 of 2) Never done   INFLUENZA VACCINE  03/13/2022   COVID-19 Vaccine (4 - 2023-24 season) 04/13/2022    There are no preventive care reminders to display for this patient.  No results found for: "TSH" Lab Results  Component Value Date   WBC 7.0 06/28/2021   HGB 11.2 06/28/2021   HCT 34.2 06/28/2021   MCV 86 06/28/2021   PLT 299 06/28/2021   Lab Results  Component Value Date   NA 145 (H) 06/28/2021   K 4.4 06/28/2021   CO2 29 06/28/2021   GLUCOSE 114 (H) 06/28/2021   BUN 11 06/28/2021   CREATININE 0.84 06/28/2021   BILITOT <0.2 06/28/2021   ALKPHOS 80 06/28/2021   AST 13 06/28/2021   ALT 9 06/28/2021   PROT 7.3 06/28/2021   ALBUMIN 4.7 06/28/2021   CALCIUM 9.8 06/28/2021   ANIONGAP 7 11/04/2018   EGFR 77  06/28/2021   Lab Results  Component Value Date   CHOL 343 (H) 06/28/2021   Lab Results  Component Value Date   HDL 135 06/28/2021   Lab Results  Component Value Date   LDLCALC 201 (H) 06/28/2021   Lab Results  Component Value Date   TRIG 61 06/28/2021   Lab Results  Component Value Date   CHOLHDL 2.5 06/28/2021   No results found for: "HGBA1C"    Assessment & Plan:   Problem List Items Addressed This Visit       Cardiovascular and Mediastinum   Hypertension - Primary    No change in medications        Respiratory   COPD GOLD D (chronic obstructive pulmonary disease) (HCC)    Continue with DuoNeb 4 times daily and will refill as needed albuterol For acute bronchitis will give a short course of cefdinir       Chronic respiratory failure with hypoxia and hypercapnia (HCC)    Continue with 4 L nasal cannula     No orders of the defined types were placed in this encounter.   Follow Up Instructions: Follow-up  phone visit in 2 months will be made discussed potential assisted living I discussed the assessment and treatment plan with the patient. The patient was provided an opportunity to ask questions and all were answered. The patient agreed with the plan and demonstrated an understanding of the instructions.   The patient was advised to call back or seek an in-person evaluation if the symptoms worsen or if the condition fails to improve as anticipated.  I provided 20 minutes of non-face-to-face time during this encounter  including  median intraservice time , review of notes, labs, imaging, medications  and explaining diagnosis and management to the patient .    Asencion Noble, MD

## 2022-09-20 NOTE — Assessment & Plan Note (Signed)
Continue with 4 L nasal cannula

## 2022-09-21 ENCOUNTER — Encounter: Payer: Self-pay | Admitting: Licensed Clinical Social Worker

## 2022-09-24 ENCOUNTER — Telehealth: Payer: Self-pay | Admitting: Licensed Clinical Social Worker

## 2022-09-24 NOTE — Patient Outreach (Signed)
  Care Coordination   09/21/22 Name: Bianca Murray MRN: [id number] DOB: 01/05/1956   Care Coordination Outreach Attempts:  An unsuccessful telephone outreach was attempted for a scheduled appointment today.  Follow Up Plan:  Additional outreach attempts will be made to offer the patient care coordination information and services.   Encounter Outcome:  No Answer   Care Coordination Interventions:  No, not indicated    Christa See, MSW, [email or web address] Phone [phone] 8:49 AM

## 2022-10-03 ENCOUNTER — Telehealth: Payer: Self-pay | Admitting: Emergency Medicine

## 2022-10-03 NOTE — Telephone Encounter (Signed)
Copied from [place] [id number]. Topic: General - Other >> Oct 03, 2022 12:45 PM Sabas Sous wrote: Reason for CRM: Pt's daughter called requesting an FL2 form for assisted living.   Best contact: [phone]

## 2022-10-03 NOTE — Telephone Encounter (Signed)
The daughter of the patient tried to return the call to [facility] but states the number listed to call is not valid. Please assist patient further

## 2022-10-03 NOTE — Telephone Encounter (Signed)
How do we get paperwork on this

## 2022-10-03 NOTE — Telephone Encounter (Signed)
Ask Opal Sidles

## 2022-10-04 ENCOUNTER — Telehealth: Payer: Self-pay | Admitting: Licensed Clinical Social Worker

## 2022-10-04 ENCOUNTER — Telehealth: Payer: Self-pay | Admitting: Emergency Medicine

## 2022-10-04 NOTE — Telephone Encounter (Signed)
Call returned to patient  [phone]  to inform her that Dr Joya Gaskins is out of the office until next week. The recording stated that the call could not be completed at this time

## 2022-10-04 NOTE — Patient Instructions (Signed)
Visit Information  Thank you for taking time to visit with me today. Please don't hesitate to contact me if I can be of assistance to you.   Following are the goals we discussed today:   Goals Addressed             This Visit's Progress    Strengthening Support   On track    Care Coordination Interventions: Solution-Focused Strategies employed:  Active listening / Reflection utilized  Emotional Support Provided Caregiver stress acknowledged  Verbalization of feelings encouraged  LCSW spoke with patient's daughter, Page Spiro, with [name], [facility] [name] endorses caregiver fatigue noting pt's ongoing substance use negatively impacts her ability to provide care. Reports that aid from [place] was caught purchasing and providing alcohol to patient. Agency has been notified and [name] has discontinued aid services [name] has scheduled a tour with [facility] to initiate LTC placement. LCSW will attempt to follow up within the week to inquire about how the tour went [name] has agreed to assist with FL2. Will follow up with Admissions Coordinator for next steps LCSW strongly encouraged [name] to implement self-care strategies to manage ongoing stress             Please call the care guide team at [phone] if you need to cancel or reschedule your appointment.   If you are experiencing a Mental Health or [place] or need someone to talk to, please call the Suicide and Crisis Lifeline: 988 call 911   Patient verbalizes understanding of instructions and care plan provided today and agrees to view in [place]. Active MyChart status and patient understanding of how to access instructions and care plan via MyChart confirmed with patient.     Christa See, MSW, [email or web address] Phone [phone] 2:44 PM

## 2022-10-04 NOTE — Patient Outreach (Signed)
  Care Coordination   Follow Up Visit Note   10/03/22 Name: Bianca Murray MRN: [id number] DOB: 12/09/1955  Bianca Murray is a 67 y.o. year old female who sees Bianca Stain, MD for primary care. I spoke with  Bianca Murray by phone today.  What matters to the patients health and wellness today?  Caregiver Fatigue/Placement    Goals Addressed             This Visit's Progress    Strengthening Support   On track    Care Coordination Interventions: Solution-Focused Strategies employed:  Active listening / Reflection utilized  Emotional Support Provided Caregiver stress acknowledged  Verbalization of feelings encouraged  LCSW spoke with patient's daughter, Bianca Murray, with Bianca Murray, [facility] Bianca Murray endorses caregiver fatigue noting pt's ongoing substance use negatively impacts her ability to provide care. Reports that aid from [place] was caught purchasing and providing alcohol to patient. Agency has been notified and Bianca Murray has discontinued aid services Bianca Murray has scheduled a tour with [facility] to initiate LTC placement. LCSW will attempt to follow up within the week to inquire about how the tour went Bianca Murray has agreed to assist with FL2. Will follow up with Admissions Coordinator for next steps LCSW strongly encouraged Bianca Murray to implement self-care strategies to manage ongoing stress             SDOH assessments and interventions completed:  No     Care Coordination Interventions:  Yes, provided   Follow up plan: Follow up call scheduled for 1 week    Encounter Outcome:  Pt. Visit Completed   Bianca Murray, MSW, [email or web address] Phone [phone] 2:43 PM

## 2022-10-04 NOTE — Telephone Encounter (Signed)
Copied from [place] [phone] [id number]. Topic: General - Other >> Oct 04, 2022  1:39 PM [place] B wrote: Reason for CRM: patient called in needs form faxed to cap program.

## 2022-10-04 NOTE — Telephone Encounter (Signed)
Noted, Thank you Opal Sidles !

## 2022-10-05 ENCOUNTER — Telehealth: Payer: Self-pay | Admitting: Licensed Clinical Social Worker

## 2022-10-05 NOTE — Telephone Encounter (Signed)
Pt returned call

## 2022-10-08 ENCOUNTER — Telehealth: Payer: Self-pay | Admitting: Licensed Clinical Social Worker

## 2022-10-08 NOTE — Patient Outreach (Signed)
  Care Coordination   Follow Up Visit Note   Encounter completed on 10/05/22 Name: Marcela Cantore MRN: [id number] DOB: [birth year]  Lilu Kirst is a 67 y.o. year old female who sees Elsie Stain, MD for primary care. I spoke with  [name] by phone today.  What matters to the patients health and wellness today?  Housing    Goals Addressed             This Visit's Progress    Strengthening Support   On track    Care Coordination Interventions: Solution-Focused Strategies employed:  Active listening / Reflection utilized  Emotional Support Provided Caregiver stress acknowledged  Verbalization of feelings encouraged  LCSW spoke with patient's daughter, Page Spiro, with [name], [employer] [name] endorses caregiver fatigue noting pt's ongoing substance use negatively impacts her ability to provide care. Reports that aid from [place] was caught purchasing and providing alcohol to patient. Agency has been notified and [name] has discontinued aid services [name] has scheduled a tour with [facility] to initiate LTC placement. LCSW will attempt to follow up within the week to inquire about how the tour went [name] has agreed to assist with FL2. Will follow up with Admissions Coordinator for next steps LCSW strongly encouraged [name] to implement self-care strategies to manage ongoing stress             SDOH assessments and interventions completed:  No     Care Coordination Interventions:  Yes, provided   Follow up plan: Follow up call scheduled for 1-2 weeks    Encounter Outcome:  Pt. Visit Completed   Christa See, MSW, [email or web address] Phone [phone] 11:15 PM

## 2022-10-08 NOTE — Patient Instructions (Signed)
Visit Information  Thank you for taking time to visit with me today. Please don't hesitate to contact me if I can be of assistance to you.   Following are the goals we discussed today:   Goals Addressed             This Visit's Progress    Strengthening Support   On track    Care Coordination Interventions: Solution-Focused Strategies employed:  Active listening / Reflection utilized  Emotional Support Provided Caregiver stress acknowledged  Verbalization of feelings encouraged  LCSW spoke with patient's daughter, Page Spiro, with [name], [facility] [name] endorses caregiver fatigue noting pt's ongoing substance use negatively impacts her ability to provide care. Reports that aid from [place] was caught purchasing and providing alcohol to patient. Agency has been notified and [name] has discontinued aid services [name] has scheduled a tour with [facility] to initiate LTC placement. LCSW will attempt to follow up within the week to inquire about how the tour went [name] has agreed to assist with FL2. Will follow up with Admissions Coordinator for next steps LCSW strongly encouraged [name] to implement self-care strategies to manage ongoing stress              If you are experiencing a Mental Health or [place] or need someone to talk to, please call the Suicide and Crisis Lifeline: 988 call 911   Patient verbalizes understanding of instructions and care plan provided today and agrees to view in [place]. Active MyChart status and patient understanding of how to access instructions and care plan via MyChart confirmed with patient.     Christa See, MSW, [email or web address] Phone [phone] 11:16 PM

## 2022-10-09 NOTE — Patient Outreach (Signed)
  Care Coordination   Follow Up Visit Note   Encounter completed on 10/08/22 Name: Bianca Murray MRN: [id number] DOB: [birth year]  Bianca Murray is a 67 y.o. year old female who sees Bianca Stain, MD for primary care. I spoke with  Bianca Murray daughter, Bianca Murray, by phone today.  What matters to the patients health and wellness today?   Assessed patient's needs, support system and barriers to care.    Goals Addressed             This Visit's Progress    Strengthening Support   On track    Activities and task to complete in order to accomplish goals.  Patient's daughter, Bianca Murray, agreed to assist with the following goals: Call Bianca Murray to follow up on how to return oxygen supplies and deliver to pt's new address in [place], [place]                SDOH assessments and interventions completed:  Yes  SDOH Interventions Today    Flowsheet Row Most Recent Value  SDOH Interventions   Food Insecurity Interventions Intervention Not Indicated  Housing Interventions Other (Comment)  [Provided contact info for Bianca Murray]  Utilities Interventions Intervention Not Indicated        Care Coordination Interventions:  Yes, provided  Interventions Today    Flowsheet Row Most Recent Value  Chronic Disease   Chronic disease during today's visit Hypertension (HTN), Chronic Obstructive Pulmonary Disease (COPD), Other  [GAD]  General Interventions   General Interventions Discussed/Reviewed General Interventions Reviewed, Durable Medical Equipment (DME), Communication with  Durable Medical Equipment (DME) Oxygen  Communication with --  [F/up provided to Bianca Murray Interventions   Mental Health Discussed/Reviewed Mental Health Reviewed, Coping Strategies       Follow up plan: Follow up call scheduled for 1-2 weeks    Encounter Outcome:  Pt. Visit Completed   Bianca Murray, MSW, [email or web address] Phone [phone] 5:37 PM

## 2022-10-09 NOTE — Patient Instructions (Signed)
Visit Information  Thank you for taking time to visit with me today. Please don't hesitate to contact me if I can be of assistance to you.   Following are the goals we discussed today:   Goals Addressed             This Visit's Progress    Strengthening Support   On track    Activities and task to complete in order to accomplish goals.  Patient's daughter, Page Spiro, agreed to assist with the following goals: Call [employer] to follow up on how to return oxygen supplies and deliver to pt's new address in [place], [place] next appointment is by telephone on 10/29/22 at 11:30 AM  Please call the care guide team at [phone] if you need to cancel or reschedule your appointment.   If you are experiencing a Mental Health or [place] or need someone to talk to, please call the Suicide and Crisis Lifeline: 988 call 911   Patient verbalizes understanding of instructions and care plan provided today and agrees to view in [place]. Active MyChart status and patient understanding of how to access instructions and care plan via MyChart confirmed with patient.     Christa See, MSW, [email or web address] Phone [phone] 5:38 PM

## 2022-10-10 ENCOUNTER — Telehealth: Payer: Self-pay | Admitting: Emergency Medicine

## 2022-10-10 ENCOUNTER — Telehealth: Payer: Self-pay | Admitting: Licensed Clinical Social Worker

## 2022-10-10 NOTE — Patient Outreach (Signed)
  Care Coordination   Follow Up Visit Note   10/10/2022 Name: Bianca Murray MRN: [id number] DOB: [birth year]  Bianca Murray is a 67 y.o. year old female who sees Bianca Stain, MD for primary care. I spoke with  Bianca Murray and daughter by phone today.  What matters to the patients health and wellness today?    Assessed patient's needs, support system and barriers to care. .     Goals Addressed             This Visit's Progress    Strengthening Support   On track    Activities and task to complete in order to accomplish goals.  Patient's daughter, Bianca Murray, agreed to assist with the following goals: Call Bianca Murray to follow up on how to return oxygen supplies and deliver to pt's new address in [place], [place]   Patient will contact [employer] on 10/10/22 to inquire about available housing Patient would like FL2 completed to assist with placement at a ALF, as last resort due to strained circumstances at current residence LCSW will collaborate with Bianca Murray RNCM to assist with patient goals              SDOH assessments and interventions completed:  No     Care Coordination Interventions:  Yes, provided  Interventions Today    Flowsheet Row Most Recent Value  Chronic Disease   Chronic disease during today's visit Hypertension (HTN), Chronic Obstructive Pulmonary Disease (COPD)  [GAD]  General Interventions   General Interventions Discussed/Reviewed General Interventions Reviewed, [employer], Level of Care  [Provided information on local emergency housing]  Communication with --  Bianca Murray RNCM]  Level of Care Assisted Living  Education Interventions   Education Provided Provided Education  Provided Verbal Education On Designer, multimedia discussed different levels of care (SNF, ALF, etc)]  [place], Anxiety, Depression       Follow up plan:  Patient and/or family will  contact LCSW with update on services provided    Encounter Outcome:  Pt. Visit Completed   Bianca Murray, MSW, [email or web address] Phone [phone] 12:48 PM

## 2022-10-10 NOTE — Patient Instructions (Signed)
Visit Information  Thank you for taking time to visit with me today. Please don't hesitate to contact me if I can be of assistance to you.   Following are the goals we discussed today:   Goals Addressed             This Visit's Progress    Strengthening Support   On track    Activities and task to complete in order to accomplish goals.  Patient's daughter, [name], agreed to assist with the following goals: Call [employer] to follow up on how to return oxygen supplies and deliver to pt's new address in [place], [place]   Patient will contact [employer] on 10/10/22 to inquire about available housing Patient would like FL2 completed to assist with placement at a ALF, as last resort due to strained circumstances at current residence LCSW will collaborate with [facility] RNCM to assist with patient goals              Please call the care guide team at [phone] if you need to cancel or reschedule your appointment.   If you are experiencing a Mental Health or [place] or need someone to talk to, please call the Suicide and Crisis Lifeline: 988 call 911   Patient verbalizes understanding of instructions and care plan provided today and agrees to view in [place]. Active MyChart status and patient understanding of how to access instructions and care plan via MyChart confirmed with patient.     The patient will call LCSW* as advised to after following up with supportive resources.   Christa See, MSW, [email or web address] Phone [phone] 12:49 PM

## 2022-10-10 NOTE — Telephone Encounter (Signed)
Copied from [place] [id number]. Topic: General - Other >> Oct 10, 2022 12:12 PM Santiya F wrote: Reason for CRM: Patient called in to return a call from her caseworker Delana Meyer, please follow up with patient.

## 2022-10-11 ENCOUNTER — Telehealth: Payer: Self-pay

## 2022-10-11 NOTE — Telephone Encounter (Signed)
Conversation with patient documented in another encounter

## 2022-10-11 NOTE — Telephone Encounter (Signed)
CAP application received from patient.   Bianca See, LCSW had informed me of the plan for patient to go to her sister's home in [place] at least temporarily but the patient has refused.  I spoke to patient's daughter, Bianca Murray, and she explained that she is exhausted trying to deal with her mother.  She said that the aide,who has been banned from her home, has convinced her mother that CAP services are all she needs and she will be able to get emergency housing. Bianca Murray that those statements are not true.  She said she has her mother on wait lists for subsidized housing. She has contacted ALFs and placed her mother on wait lists.  She visited [place] and was not interested at all. She was not pleased with the facility and said she would worry about her mother being there.  She was not interested in a 2 week respite that [employer] offered.  Bianca Murray said her mother has refused ALF and even refused to take the tour of [place] last week.   Bianca Murray owns the home where her mother lives and her mother pays her rent. Bianca Murray does not want to evict her mother. Bianca Murray does not want CAP services because she will not allow any outside caregivers in her home.   Bianca Murray is extremely frustrated that her mother will not listen to her at all.  Her mother has refused to go to [place] to stay with her sister even though she knows it does not have to be permanent and she is on wait lists for permanent housing.   I encouraged her to call the [employer] regarding other possible options for housing for her mother. She said she has their phone number. I told her that I would also be in contact with [facility] if other options for support become available.   I then called the patient.  She said her daughter will not speak to her.  She said she does not want to go to [place] or an ALF.  I explained that the wait list for housing is very long as she is well aware and the option to  stay with her sister does not have to be forever. She said she Murray and she wants to take care of her health.  She does not think that she can live at her sister's home in [place] because she cannot go up and down stairs. She would be staying in a lower level bedroom with a bathroom on that level.  I explained to her that she would still be eligible for CAP and PCS there through [facility].  She said she does not want her health to decline but she does not want to go to her sister's home or ALF.  She said that she pays rent to her daughter and she does not want her daughter to evict her.  She said that her son, who she can't stand, is supposed to come tomorrow to take her to [place]. She said she will not go and will call the police if they try to take her there without her consent.   Bianca Murray- what do you think of calling them on a conference call?

## 2022-10-12 ENCOUNTER — Other Ambulatory Visit: Payer: Self-pay | Admitting: Critical Care Medicine

## 2022-10-12 DIAGNOSIS — U071 COVID-19: Secondary | ICD-10-CM

## 2022-10-12 NOTE — Telephone Encounter (Signed)
Christa See, LCSW and I spoke to patient's daughter,[name] today and Page Spiro said that her mother is going to [place] this afternoon.  [name] was on her way home to help her mother prepare for the transition.  She has been in contact with [employer] and has a plan to insure that there is no interruption in her mother's O2 service.  The plan is for the patient to try this living arrangement for at least 30 days.

## 2022-10-12 NOTE — Telephone Encounter (Signed)
Inhaler discontinued at last OV- patient to use nebulizer Requested Prescriptions  Pending Prescriptions Disp Refills   albuterol (VENTOLIN HFA) 108 (90 Base) MCG/ACT inhaler [Pharmacy Med Name: ALBUTEROL SULFATE HFA 108 (90 BASE) MCG/ACT INHALATION AEROSOL SOLUTION] 8.5 g 0    Sig: INHALE 2 PUFFS INTO THE LUNGS EVERY 6 (SIX) HOURS AS NEEDED FOR WHEEZING OR SHORTNESS OF BREATH.     Pulmonology:  Beta Agonists 2 Passed - 10/12/2022  9:13 AM      Passed - Last BP in normal range    BP Readings from Last 1 Encounters:  09/28/21 110/64         Passed - Last Heart Rate in normal range    Pulse Readings from Last 1 Encounters:  08/24/21 96         Passed - Valid encounter within last 12 months    Recent Outpatient Visits           3 weeks ago Mixed simple and mucopurulent chronic bronchitis ([place])   [place] Elsie Stain, MD   1 month ago Mixed simple and mucopurulent chronic bronchitis [facility])   [place] Karle Plumber B, MD   2 months ago Chronic respiratory failure with hypoxia and hypercapnia [facility])   [place] Elsie Stain, MD   4 months ago Chronic respiratory failure with hypoxia and hypercapnia [facility])   [place] Elsie Stain, MD   5 months ago Mixed simple and mucopurulent chronic bronchitis [facility])   [place] Elsie Stain, MD       Future Appointments             In 2 months Joya Gaskins Burnett Harry, MD [place]

## 2022-10-16 NOTE — Telephone Encounter (Signed)
Pt would like a nurse to call.  She is staying at her sister's house for right now but patient needs nursing services.   Her sister lives in [place].  She said she is not going back to her daughters.  CB#  [phone]

## 2022-10-18 ENCOUNTER — Other Ambulatory Visit: Payer: Self-pay | Admitting: Critical Care Medicine

## 2022-10-18 NOTE — Telephone Encounter (Signed)
Requested medication (s) are due for refill today: yes  Requested medication (s) are on the active medication list: yes   Last refill:  proventil nebulizer- 09/20/22-11/19/22 #270 ml 1 refills, hydroxyzine - 09/20/22 #60 0 refills, flexeril - 09/20/22 #60 0 refills, omnicef- 09/20/22 #14 0 refills  Future visit scheduled: yes in 2 months ,  Notes to clinic:  no refills remain. Flexeril not delegated per protocol , omnicef medication not assigned to a protocol. Do you want to refill Rxs?     Requested Prescriptions  Pending Prescriptions Disp Refills   albuterol (PROVENTIL) (2.5 MG/3ML) 0.083% nebulizer solution [Pharmacy Med Name: ALBUTEROL SULFATE (2.5 MG/3ML) 0.083% INHALATION NEBULIZATION SOLUTION] 225 mL     Sig: USE ONE VIAL (2.5 MG TOTAL) BY NEBULIZATION 3 (THREE) TIMES DAILY.     Pulmonology:  Beta Agonists 2 Passed - 10/18/2022 12:10 PM      Passed - Last BP in normal range    BP Readings from Last 1 Encounters:  09/28/21 110/64         Passed - Last Heart Rate in normal range    Pulse Readings from Last 1 Encounters:  08/24/21 96         Passed - Valid encounter within last 12 months    Recent Outpatient Visits           4 weeks ago Mixed simple and mucopurulent chronic bronchitis ([place])   [place] Elsie Stain, MD   1 month ago Mixed simple and mucopurulent chronic bronchitis [facility])   [place] Karle Plumber B, MD   2 months ago Chronic respiratory failure with hypoxia and hypercapnia [facility])   [place] Elsie Stain, MD   4 months ago Chronic respiratory failure with hypoxia and hypercapnia [facility])   [place] Elsie Stain, MD   5 months ago Mixed simple and mucopurulent chronic bronchitis [facility])   [place] Elsie Stain, MD       Future Appointments             In 2  months Elsie Stain, MD [place]             hydrOXYzine (ATARAX) 25 MG tablet [Pharmacy Med Name: HYDROXYZINE HCL 25 MG ORAL TABLET] 60 tablet 0    Sig: Take 1 tablet (25 mg total) by mouth 3 (three) times daily as needed for anxiety.     Ear, Nose, and Throat:  Antihistamines 2 Failed - 10/18/2022 12:10 PM      Failed - Cr in normal range and within 360 days    Creatinine, Ser  Date Value Ref Range Status  06/28/2021 0.84 0.57 - 1.00 mg/dL Final         Passed - Valid encounter within last 12 months    Recent Outpatient Visits           4 weeks ago Mixed simple and mucopurulent chronic bronchitis [facility])   [place] Elsie Stain, MD   1 month ago Mixed simple and mucopurulent chronic bronchitis [facility])   [place] Karle Plumber B, MD   2 months ago Chronic respiratory failure with hypoxia and hypercapnia [facility])   [place] Elsie Stain, MD  4 months ago Chronic respiratory failure with hypoxia and hypercapnia [facility])   [place] Elsie Stain, MD   5 months ago Mixed simple and mucopurulent chronic bronchitis [facility])   [place] Elsie Stain, MD       Future Appointments             In 2 months Elsie Stain, MD [place]             cyclobenzaprine (FLEXERIL) 10 MG tablet [Pharmacy Med Name: CYCLOBENZAPRINE HCL 10 MG ORAL TABLET] 60 tablet 0    Sig: Take 1 tablet (10 mg total) by mouth 3 (three) times daily as needed for muscle spasms.     Not Delegated - Analgesics:  Muscle Relaxants Failed - 10/18/2022 12:10 PM      Failed - This refill cannot be delegated      Passed - Valid encounter within last 6 months    Recent Outpatient Visits           4 weeks ago Mixed simple and  mucopurulent chronic bronchitis ([place])   [place] Elsie Stain, MD   1 month ago Mixed simple and mucopurulent chronic bronchitis [facility])   [place] Karle Plumber B, MD   2 months ago Chronic respiratory failure with hypoxia and hypercapnia [facility])   [place] Elsie Stain, MD   4 months ago Chronic respiratory failure with hypoxia and hypercapnia [facility])   [place] Elsie Stain, MD   5 months ago Mixed simple and mucopurulent chronic bronchitis [facility])   [place] Elsie Stain, MD       Future Appointments             In 2 months Elsie Stain, MD [place]             cefdinir (OMNICEF) 300 MG capsule [Pharmacy Med Name: CEFDINIR 300 MG ORAL CAPSULE] 14 capsule 0    Sig: Take 1 capsule (300 mg total) by mouth 2 (two) times daily.     Off-Protocol Failed - 10/18/2022 12:10 PM      Failed - Medication not assigned to a protocol, review manually.      Passed - Valid encounter within last 12 months    Recent Outpatient Visits           4 weeks ago Mixed simple and mucopurulent chronic bronchitis ([place])   [place] Elsie Stain, MD   1 month ago Mixed simple and mucopurulent chronic bronchitis [facility])   [place] Karle Plumber B, MD   2 months ago Chronic respiratory failure with hypoxia and hypercapnia [facility])   [place] Elsie Stain, MD   4 months ago Chronic respiratory failure with hypoxia and hypercapnia [facility])   [place] Elsie Stain, MD   5 months ago Mixed simple and mucopurulent chronic bronchitis [facility])   [place] Elsie Stain, MD        Future Appointments             In 2 months Elsie Stain,  MD [place]

## 2022-10-22 ENCOUNTER — Telehealth: Payer: Self-pay | Admitting: Licensed Clinical Social Worker

## 2022-10-22 ENCOUNTER — Telehealth: Payer: Self-pay | Admitting: Critical Care Medicine

## 2022-10-22 NOTE — Patient Instructions (Signed)
Visit Information  Thank you for taking time to visit with me today. Please don't hesitate to contact me if I can be of assistance to you.   Following are the goals we discussed today:   Goals Addressed             This Visit's Progress    Strengthening [place]   On track    Activities and task to complete in order to accomplish goals.  Patient's daughter, Bianca Murray, agreed to assist with the following goals: Family would like FL2 completed to assist with placement at a [facility] for [place] will collaborate with [facility] RNCM and Evelena Asa, RN to assist with completion of FL2. Family requests PCP office fax signed FL2 to Sudie Grumbling at [phone] Bianca Murray will follow up with [facility] to confirm receipt of FL2 and inquire about any additional paperwork needs from PCP office                   Our next appointment is by telephone on 3/25 at 10 AM  Please call the care guide team at [phone] if you need to cancel or reschedule your appointment.   If you are experiencing a Mental Health or [place] or need someone to talk to, please call the Suicide and Crisis Lifeline: 988 call 911   Patient verbalizes understanding of instructions and care plan provided today and agrees to view in [place]. Active MyChart status and patient understanding of how to access instructions and care plan via MyChart confirmed with patient.     Christa See, MSW, [email or web address] Phone [phone] 3:50 PM

## 2022-10-22 NOTE — Patient Outreach (Signed)
  Care Coordination   Follow Up Visit Note   10/22/2022 Name: Bianca Murray MRN: [id number] DOB: 13-May-1956  Bianca Murray is a 67 y.o. year old female who sees Elsie Stain, MD for primary care. I spoke with  Bianca Murray by phone today.  What matters to the patients health and wellness today?  Placement needs    Goals Addressed             This Visit's Progress    Strengthening [place]   On track    Activities and task to complete in order to accomplish goals.  Patient's daughter, Bianca Murray, agreed to assist with the following goals: Family would like FL2 completed to assist with placement at a [facility] for [place] will collaborate with [facility] RNCM to assist with completion of FL2. Family requests PCP office fax signed FL2 to Sudie Grumbling at [phone] Bianca Murray will follow up with [facility] to confirm receipt of FL2 and inquire about any additional paperwork needs from PCP office                   SDOH assessments and interventions completed:  No     Care Coordination Interventions:  Yes, provided  Interventions Today    Flowsheet Row Most Recent Value  Chronic Disease   Chronic disease during today's visit Hypertension (HTN), Chronic Obstructive Pulmonary Disease (COPD), Other  [GAD]  General Interventions   General Interventions Discussed/Reviewed General Interventions Reviewed, Communication with, Level of Care  [Family are no longer interested in [facility] services. LCSW encouraged family to consider CAP to assist patient with care]  Communication with --  [LCSW will continue to collaborate with RNCM at [place]  [Family is completing ppwk for patient to be placed in Assisted Living at [place] Discussed/Reviewed Coping Strategies, Other  [LCSW continued to encourage Caregiver to practice self care to assist with stress management]        Follow up plan: Follow up call scheduled for 1-2 weeks    Encounter Outcome:  Pt. Visit Completed   Christa See, MSW, [email or web address] Phone [phone] 9:42 AM

## 2022-10-22 NOTE — Telephone Encounter (Signed)
Copied from [place] [id number]. Topic: General - Other >> Oct 22, 2022 10:07 AM Cyndi Bender wrote: Reason for CRM: Pt daughter Alferd Patee requests to speak with Opal Sidles regarding completing paperwork for assistant living. [name] requests that Opal Sidles return her call.

## 2022-10-23 NOTE — Telephone Encounter (Signed)
Pls track down this pts FL2 on jane desk so I can sign, she is out until 3/19

## 2022-10-23 NOTE — Telephone Encounter (Signed)
noted 

## 2022-10-23 NOTE — Telephone Encounter (Signed)
In your box by the door.

## 2022-10-25 ENCOUNTER — Encounter: Payer: Self-pay | Admitting: Licensed Clinical Social Worker

## 2022-10-25 NOTE — Telephone Encounter (Signed)
Pt stated she missed a call from the office / please advise

## 2022-10-25 NOTE — Patient Outreach (Signed)
  Care Coordination   Follow Up/Collaborative Visit Note   10/25/2022 Name: Bianca Murray MRN: [id number] DOB: 09/03/1955  Bianca Murray is a 67 y.o. year old female who sees Elsie Stain, MD for primary care. I  spoke with Bianca Asa, RN at [facility]  What matters to the patients health and wellness today?  Patient was not engaged during this encounter   SDOH assessments and interventions completed:  No     Care Coordination Interventions:  Yes, provided  Interventions Today    Flowsheet Row Most Recent Value  Chronic Disease   Chronic disease during today's visit Hypertension (HTN), Chronic Obstructive Pulmonary Disease (COPD), Other  [GAD]  General Interventions   General Interventions Discussed/Reviewed Communication with  Communication with RN  [LCSW collaborated with [facility] Triage Nurse regarding f/up on FL2 to assist with placement needs]       Follow up plan: Follow up call scheduled for 1-2 weeks    Encounter Outcome:  Pt. Visit Completed   Bianca Murray, MSW, [email or web address] Phone [phone] 4:58 PM

## 2022-10-25 NOTE — Telephone Encounter (Signed)
Pt called saying she is living with her sister in [place] right now and she is requesting an aid to help her at her sisters house.  CB#  [phone]

## 2022-10-29 ENCOUNTER — Ambulatory Visit: Payer: Self-pay | Admitting: Licensed Clinical Social Worker

## 2022-10-29 NOTE — Patient Instructions (Signed)
Visit Information  Thank you for taking time to visit with me today. Please don't hesitate to contact me if I can be of assistance to you.   Following are the goals we discussed today:   Goals Addressed             This Visit's Progress    Strengthening [place]   On track    Activities and task to complete in order to accomplish goals.  Patient's daughter, Page Spiro, agreed to assist with the following goals: Family will update team on next steps (SNF or AL) regarding housing Family will follow up with [facility] for [place]. Pt is currently on wait-list LCSW will collaborate with [facility] RNCM for additional patient care needs                    Our next appointment is by telephone on 3/25 at 10 AM  Please call the care guide team at [phone] if you need to cancel or reschedule your appointment.   If you are experiencing a Mental Health or [place] or need someone to talk to, please call the Suicide and Crisis Lifeline: 988 call 911   Patient verbalizes understanding of instructions and care plan provided today and agrees to view in [place]. Active MyChart status and patient understanding of how to access instructions and care plan via MyChart confirmed with patient.     Christa See, MSW, [email or web address] Phone [phone] 3:26 PM

## 2022-10-29 NOTE — Patient Outreach (Signed)
  Care Coordination   Follow Up Visit Note   10/29/2022 Name: Devlyn Khatoon MRN: [id number] DOB: 01/11/1956  Myrian Kret is a 67 y.o. year old female who sees Elsie Stain, MD for primary care. I spoke with  [name] and Alferd Patee by phone today.  What matters to the patients health and wellness today?  Symptom Management    Goals Addressed             This Visit's Progress    Strengthening [place]   On track    Activities and task to complete in order to accomplish goals.  Patient's daughter, Page Spiro, agreed to assist with the following goals: Family will update team on next steps (SNF or AL) regarding housing Family will follow up with [facility] for [place]. Pt is currently on wait-list LCSW will collaborate with [facility] RNCM for additional patient care needs                    SDOH assessments and interventions completed:  No     Care Coordination Interventions:  Yes, provided  Interventions Today    Flowsheet Row Most Recent Value  Chronic Disease   Chronic disease during today's visit Hypertension (HTN), Chronic Obstructive Pulmonary Disease (COPD), Other  [GAD]  General Interventions   General Interventions Discussed/Reviewed General Interventions Reviewed, Doctor Visits  Doctor Visits Discussed/Reviewed Doctor Visits Reviewed  [Patient is currently inpatient at hospital due to chronic health conditions]  Communication with RN  Level of Care Assisted Living, [place]  [LCSW discussed different levels of care and difference between AL and SNF]  [place] Reviewed, Coping Strategies  [Strategies discussed to assist with anxiety symptoms and Caregiver Stress. Provided validation and encouragement]       Follow up plan: Follow up call scheduled for 1-2 weeks    Encounter Outcome:  Pt. Visit Completed   Christa See, MSW, [email or web address] Phone [phone] 3:25 PM

## 2022-10-30 ENCOUNTER — Encounter: Payer: Self-pay | Admitting: Licensed Clinical Social Worker

## 2022-10-30 NOTE — Patient Outreach (Signed)
  Care Coordination   Follow Up/Collaboration Visit Note   10/30/2022 Name: Bianca Murray MRN: [id number] DOB: 1955-08-23  Bianca Murray is a 67 y.o. year old female who sees Bianca Stain, MD for primary care. I  spoke with [facility] RNCM  What matters to the patients health and wellness today?  Patient was not engaged during this encounter    SDOH assessments and interventions completed:  No     Care Coordination Interventions:  Yes, provided  Interventions Today    Flowsheet Row Most Recent Value  Chronic Disease   Chronic disease during today's visit Hypertension (HTN), Chronic Obstructive Pulmonary Disease (COPD), Other  [GAD]  General Interventions   General Interventions Discussed/Reviewed Communication with  Communication with RN  [facility] RNCM]       Follow up plan: Follow up call scheduled for 1-2 weeks    Encounter Outcome:  Pt. Visit Completed   Christa See, MSW, [email or web address] Phone [phone] 5:16 PM

## 2022-11-05 ENCOUNTER — Ambulatory Visit: Payer: Self-pay | Admitting: Licensed Clinical Social Worker

## 2022-11-05 NOTE — Patient Instructions (Signed)
Visit Information  Thank you for taking time to visit with me today. Please don't hesitate to contact me if I can be of assistance to you.   Following are the goals we discussed today:   Goals Addressed             This Visit's Progress    Strengthening [place]   On track    Activities and task to complete in order to accomplish goals.  Patient's daughter, Page Spiro, agreed to assist with the following goals: Daughter will visit SNF and inquire about any ppwk needed to ensure placement Patient will comply with medications prescribed by providers Patient will keep any upcoming appts  LCSW will collaborate with [facility] RNCM for additional patient care needs                   Please call the care guide team at [phone] if you need to cancel or reschedule your appointment.   If you are experiencing a Mental Health or [place] or need someone to talk to, please call the Suicide and Crisis Lifeline: 988 call 911   Patient verbalizes understanding of instructions and care plan provided today and agrees to view in [place]. Active MyChart status and patient understanding of how to access instructions and care plan via MyChart confirmed with patient.     Christa See, MSW, [email or web address] Phone [phone] 4:24 PM

## 2022-11-12 ENCOUNTER — Other Ambulatory Visit: Payer: Self-pay | Admitting: Critical Care Medicine

## 2022-11-12 ENCOUNTER — Telehealth: Payer: Self-pay | Admitting: Licensed Clinical Social Worker

## 2022-11-12 ENCOUNTER — Telehealth: Payer: Self-pay | Admitting: Critical Care Medicine

## 2022-11-12 DIAGNOSIS — U071 COVID-19: Secondary | ICD-10-CM

## 2022-11-12 NOTE — Telephone Encounter (Signed)
Copied from [place] [id number]. Topic: General - Other >> Nov 12, 2022  9:20 AM Everette C wrote: Reason for CRM: The patient's daughter shares that the patient is currently in rehabilitation ([place])  The patient's daughter would like to further discuss their concerns with member of clinical staff  Please contact further when possible

## 2022-11-13 ENCOUNTER — Encounter: Payer: Self-pay | Admitting: Critical Care Medicine

## 2022-11-13 NOTE — Telephone Encounter (Signed)
Called Ms. Page Spiro and she stated that since her mother is in the nursing home the doctor there will be her PCP but they are want you to still see/be her pulmonary doctor. I did let the daughter know that it will be sometime tomorrow before you give her a call

## 2022-11-13 NOTE — Patient Instructions (Signed)
Visit Information  Thank you for taking time to visit with me today. Please don't hesitate to contact me if I can be of assistance to you.   Following are the goals we discussed today:   Goals Addressed             This Visit's Progress    Strengthening Bianca Murray   On track    Activities and task to complete in order to accomplish goals.  Patient's daughter, Bianca Murray, agreed to assist with the following goals: Daughter will follow up with PCP office regarding hospital f/up appt regarding pt's pulmonary concerns Patient will comply with medications prescribed by providers. Daughter provided meds to facility Patient will keep any upcoming appts  LCSW will collaborate with [facility] RNCM for additional patient care needs                   Please call the care guide team at [phone] if you need to cancel or reschedule your appointment.   If you are experiencing a Mental Health or [place] or need someone to talk to, please call the Suicide and Crisis Lifeline: 988 call 911   Patient verbalizes understanding of instructions and care plan provided today and agrees to view in [place]. Active MyChart status and patient understanding of how to access instructions and care plan via MyChart confirmed with patient.     Christa See, MSW, [email or web address] Phone [phone] 8:43 PM

## 2022-11-13 NOTE — Patient Outreach (Signed)
  Care Coordination   Follow Up Visit Note   11/12/22 Name: Bianca Murray MRN: [id number] DOB: [birth year]  Bianca Murray is a 67 y.o. year old female who sees Elsie Stain, MD for primary care. I spoke with  Kattia Sevey daughter by phone today.  What matters to the patients health and wellness today?  Medications/Follow Up appts    Goals Addressed             This Visit's Progress    Strengthening [place]   On track    Activities and task to complete in order to accomplish goals.  Patient's daughter, Page Spiro, agreed to assist with the following goals: Daughter will follow up with PCP office regarding hospital f/up appt regarding pt's pulmonary concerns Patient will comply with medications prescribed by providers. Daughter provided meds to facility Patient will keep any upcoming appts  LCSW will collaborate with [facility] RNCM for additional patient care needs                    SDOH assessments and interventions completed:  No     Care Coordination Interventions:  Yes, provided  Interventions Today    Flowsheet Row Most Recent Value  Chronic Disease   Chronic disease during today's visit Hypertension (HTN), Chronic Obstructive Pulmonary Disease (COPD), Other  [GAD]  General Interventions   General Interventions Discussed/Reviewed General Interventions Reviewed, Doctor Visits  Doctor Visits Discussed/Reviewed Doctor Visits Reviewed  [place], Coping Strategies  Pharmacy Interventions   Pharmacy Dicussed/Reviewed Medication Adherence, Pharmacy Topics Reviewed       Follow up plan: Follow up call scheduled for 1-2 weeks    Encounter Outcome:  Pt. Visit Completed   Christa See, MSW, [email or web address] Phone [phone] 8:43 PM

## 2022-11-14 ENCOUNTER — Telehealth: Payer: Self-pay | Admitting: Critical Care Medicine

## 2022-11-14 NOTE — Telephone Encounter (Signed)
I spoke to the patient's daughter the patient is now on [place] home transferred from [facility].  Apparently she is only getting a nebulized albuterol as needed and has not received any of her other chronic pulmonary medications.  I am not sure why this has occurred.  We need to get this patient into the office for a face-to-face exam and have a nursing home attendant come with her with the nursing home medication list  Apparently the internist that is rounding her will not address her COPD medications only will address her other chronic medications  Opal Sidles can you help with this and communicate with [facility] to get this patient in for a face-to-face exam.  Carly we can double book to bring her in next week  She will be very high risk for decompensating and return to the hospital  In looking at my schedule I have 11 on for Wednesday and Thursday 22 for Tuesday  I have 11 on for tomorrow I suppose if you can get her in tomorrow I can add her on then since I am so overbooked

## 2022-11-14 NOTE — Telephone Encounter (Signed)
I spoke to [place] and the information is documented in another telephone encounter from today

## 2022-11-14 NOTE — Addendum Note (Signed)
Addended by: Asencion Noble E on: 11/14/2022 09:07 AM   Modules accepted: Orders

## 2022-11-14 NOTE — Telephone Encounter (Signed)
Copied from [place] [id number]. Topic: General - Other >> Nov 14, 2022  1:14 PM Dominique A wrote: Reason for CRM: Pt daughter Page Spiro is calling to speak with [name] regarding pt. Please call [name] back.

## 2022-11-15 NOTE — Telephone Encounter (Signed)
I called [facility] :# [phone] and spoke to [facility] regarding transportation for patient to her appointment with Dr Joya Gaskins 11/21/2022.  She said she would have the transportation department call me back for more information.

## 2022-11-16 NOTE — Telephone Encounter (Signed)
Noted  

## 2022-11-19 NOTE — Telephone Encounter (Signed)
I spoke to Bianca Murray/ [facility] transportation and scheduled the ride for the patient to her appointment with Dr Delford Field on 11/21/2022.  I also called patient's daughter, Loma Newton, and informed her that the ride has been arranged.

## 2022-11-20 ENCOUNTER — Telehealth: Payer: Self-pay

## 2022-11-20 NOTE — Telephone Encounter (Unsigned)
Copied from CRM [id number]. Topic: Appointment Scheduling - Scheduling Inquiry for Clinic >> Nov 20, 2022  2:17 PM Dondra Prader E wrote: Reason for CRM: Pt called and wants to know if she can have a virtual visit tomorrow instead of coming in. She will not have transportation available. She would prefer a phone call instead of a video visit.   Please advise if PCP approves, patient will need to reschedule otherwise

## 2022-11-21 ENCOUNTER — Ambulatory Visit: Payer: 59 | Attending: Critical Care Medicine | Admitting: Critical Care Medicine

## 2022-11-21 ENCOUNTER — Encounter: Payer: Self-pay | Admitting: Critical Care Medicine

## 2022-11-21 DIAGNOSIS — J9611 Chronic respiratory failure with hypoxia: Secondary | ICD-10-CM

## 2022-11-21 DIAGNOSIS — J9612 Chronic respiratory failure with hypercapnia: Secondary | ICD-10-CM

## 2022-11-21 DIAGNOSIS — J418 Mixed simple and mucopurulent chronic bronchitis: Secondary | ICD-10-CM

## 2022-11-21 NOTE — Progress Notes (Unsigned)
Established Patient Office Visit  Subjective:  Patient ID: Bianca Murray, female    DOB: Dec 25, 1955  Age: 67 y.o. MRN: [id number] Virtual Visit via phone note  I connected with Bianca Murray on 11/22/22 at 1030am by a phone enabled telemedicine application and verified that I am speaking with the correct person using two identifiers.   Consent:  I discussed the limitations, risks, security and privacy concerns of performing an evaluation and management service by phone visit and the availability of in person appointments. I also discussed with the patient that there may be a patient responsible charge related to this service. The patient expressed understanding and agreed to proceed.  Location of patient: Patient's [facility]  Location of provider: I am in my office  Persons participating in the televisit with the patient.    No one else on the call History of Present Illness:    CC: Primary care follow-up  HPI 09/28/2021 Bianca Murray presents for primary care follow-up on video.  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  She does tell me she now has her motorized wheelchair and this is doing well inside her home.  Her breathing is at baseline.  She has gotten over the COVID illness she had in the past year.  She needs refills on multiple medications.  There are no primary care gaps active at this time.  She might benefit from a shingles vaccine.  Patient still being visited by the EmT.  11/2021 Patient returns for 24-month follow-up with a video visit.  She had an eye exam recently and she has had decreased vision in the left eye she is concerned about this.  She was given Klonopin by the PA McClung 2 weeks ago she was only given 14 pills.  She also has hydroxyzine she states this does help.  She had an issue with Klonopin in the past would like to avoid further Klonopin.  Patient did  receive azithromycin with a video visit with another provider for cough the cough now has resolved.  She is on 4 and half liters of oxygen today her saturation is 95%.  Blood pressure today 135/80.  Pulse is 100.  Patient would like a new handicap sticker.  She needs refills on a variety of her medications.  She would like her hernia and the abdomen examined.  There are no other complaints at this time.  05/08/22 Since the last visit the patient is declining quickly.  She is on 4-1/2 to 5 L oxygen.  She needs help with bathing cleaning her room and now needs help with ambulation.  Her daughter is away during the day working.  She is by herself.  She does have a motorized wheelchair.  She also has a rollator.  She would like help with ambulating a bit around the home.  She is able to manage her own medications.  She states that she cannot use the breztri any longer she cannot take a deep enough breath to get the medication in her lungs.  She does have a home nebulizer only has albuterol for this.  She does have a cough productive of white bubbly mucus.  Her saturations were in 86 to 88% on 4-1/2 L.  There is no chest pain.  She currently has 2 hours 5 days a week Monday through Friday a PCS she is requesting now 6 hours a  day 5 days a week.  She would be satisfied with 4 hours a day 5 days a week if she cannot get the 6 hours  10/12 Patient seen in return follow-up by telephone contact she has been concerned about blood pressure most the time is 130/80 but recently 153/90 she just finished a course of prednisone this caused lightheadedness she had to stop early.  She is concerned about her breathlessness getting worse over time.  12/21 Patient is seen by way of a phone visit.  She has severe end-stage COPD and her saturations on 4-1/2 L are 95% any less she is in the 85 to 86% range at home.  She needs an increased dose of her oxygen per [employer].  Patient is wishing to get into the Program for increased hours  from [facility] for home care services we recommended assisted living but the patient has declined this at this time.  She is having increased cough productive of thick mucus.  She is wheezing more.  The daughter says she is having difficulty managing her on just 2 hours 5 times a week with a personal care service.  09/20/22 Patient seen in return follow-up on a telephone visit she states she is having more cough more congestion.  She does not oximetry check while on the phone she is 98% on 4 L heart rate 101.  She is inquiring if she can get the Program.  She is having dry skin.  Breathing is continuing to decline.  She is still giving consideration to assisted living  11/21/22 The patient is back in [place] now to a nursing facility with potential transfer to long-term care.  The patient had a bout of respiratory failure while in [place] visiting her sister.  She was admitted to [facility].  She received BiPAP therapy intensive Nabb antibiotics and steroids.  The patient's chest x-ray showed by basilar atelectasis.  Cultures were nonrevealing.  Patient was then transferred to [facility].  She currently is onOn an iron supplement daily lidocaine patch daily atorvastatin 10 mg daily budesonide 0.5 mg twice daily by nebulizer gabapentin 100 mg 3 times daily she is on a DuoNeb 4 times daily on schedule but also on a long-acting anticholinergic nebulizer called yupelri The patient is on vitamin D weekly.  Patient states she is having increased cough and mucus production.  She is more short of breath and wheezing.  The nursing home physician did not want to manage her nebulized therapy and deferred to me.   Past Medical History:  Diagnosis Date   Arthritis    Asthma    Cancer    Chronic respiratory failure with hypoxia and hypercapnia    COPD (chronic obstructive pulmonary disease)    Hypertension     Past Surgical History:  Procedure Laterality Date   ABDOMINAL  HYSTERECTOMY     TRACHEOSTOMY     reversed   TRACHEOSTOMY CLOSURE      Family History  Problem Relation Age of Onset   Hypertension Mother    Heart disease Mother    Cancer Mother    Asthma Father    Asthma Sister    Hypertension Sister    Asthma Brother    Cancer Brother     Social History   Socioeconomic History   Marital status: Single    Spouse name: Not on file   Number of children: Not on file   Years of education: Not on file   Highest education level: Not  on file  Occupational History   Not on file  Tobacco Use   Smoking status: Former   Smokeless tobacco: Never   Tobacco comments:    quit smoking in 2018    Vaping Use   Vaping Use: Never used  Substance and Sexual Activity   Alcohol use: Not Currently   Drug use: Not Currently   Sexual activity: Not on file  Other Topics Concern   Not on file  Social History Narrative   Not on file   Social Determinants of Health   Financial Resource Strain: Not on file  Food Insecurity: No Food Insecurity (10/08/2022)   Hunger Vital Sign    Worried About Running Out of Food in the Last Year: Never true    Ran Out of Food in the Last Year: Never true  Transportation Needs: No Transportation Needs (08/08/2022)   PRAPARE - Administrator, Civil Service (Medical): No    Lack of Transportation (Non-Medical): No  Physical Activity: Not on file  Stress: Not on file  Social Connections: Not on file  Intimate Partner Violence: Not on file    Outpatient Medications Prior to Visit  Medication Sig Dispense Refill   albuterol (PROVENTIL) (2.5 MG/3ML) 0.083% nebulizer solution USE ONE VIAL (2.5 MG TOTAL) BY NEBULIZATION 3 (THREE) TIMES DAILY. 225 mL 0   albuterol (VENTOLIN HFA) 108 (90 Base) MCG/ACT inhaler INHALE 2 PUFFS INTO THE LUNGS EVERY 6 (SIX) HOURS AS NEEDED FOR WHEEZING OR SHORTNESS OF BREATH. 8.5 g 0   amLODipine (NORVASC) 5 MG tablet TAKE 1 TABLET(5 MG) BY MOUTH DAILY 90 tablet 1   atorvastatin  (LIPITOR) 10 MG tablet Take 1 tablet (10 mg total) by mouth daily. 90 tablet 3   azelastine (ASTELIN) 0.1 % nasal spray Place 2 sprays into both nostrils 2 (two) times daily. Use in each nostril as directed 30 mL 12   benzonatate (TESSALON) 100 MG capsule Take 1 capsule (100 mg total) by mouth 2 (two) times daily as needed for cough. 20 capsule 0   Blood Pressure Monitoring (BLOOD PRESSURE MONITOR AUTOMAT) DEVI Measure blood pressure and pulse daily 1 Device 0   budesonide (PULMICORT) 0.5 MG/2ML nebulizer solution Take 0.5 mg by nebulization.     cyclobenzaprine (FLEXERIL) 10 MG tablet TAKE 1 TABLET (10 MG TOTAL) BY MOUTH 3 (THREE) TIMES DAILY AS NEEDED FOR MUSCLE SPASMS. 60 tablet 0   fluticasone (FLONASE) 50 MCG/ACT nasal spray Place 2 sprays into both nostrils daily. 16 g 6   formoterol (PERFOROMIST) 20 MCG/2ML nebulizer solution Take 20 mcg by nebulization 2 (two) times daily.     gabapentin (NEURONTIN) 100 MG capsule Take 1 capsule (100 mg total) by mouth 3 (three) times daily. 90 capsule 3   hydrOXYzine (ATARAX) 25 MG tablet TAKE 1 TABLET (25 MG TOTAL) BY MOUTH 3 (THREE) TIMES DAILY AS NEEDED FOR ANXIETY. 60 tablet 0   ibuprofen (ADVIL) 600 MG tablet TAKE 1 TABLET (600 MG) BY MOUTH EVERY 6 HOURS AS NEEDED 90 tablet 0   ipratropium-albuterol (DUONEB) 0.5-2.5 (3) MG/3ML SOLN Take 3 mLs by nebulization 4 (four) times daily. 360 mL 6   loratadine (CLARITIN) 10 MG tablet TAKE ONE TABLET BY MOUTH ONCE DAILY (AM) 90 tablet 1   losartan (COZAAR) 100 MG tablet Take 1 tablet (100 mg total) by mouth daily. 90 tablet 2   Misc. Devices (PULSE OXIMETER FOR FINGER) MISC 1 application by Does not apply route daily as needed. 1 each 0  Misc. Devices MISC Nebulizer machine, Large shower chair.  Diagnosis- chronic respiratory failure 1 each 0   Misc. Devices MISC Bedside commode. Diagnosis: COPD 1 each 0   OXYGEN 4-5 Liter DAILY (route: Oxygen)     pantoprazole (PROTONIX) 40 MG tablet TAKE 1 TABLET BY MOUTH 2  TIMES DAILY BEFORE A MEAL (AM+EVENING) 60 tablet 2   polyethylene glycol powder (GLYCOLAX/MIRALAX) 17 GM/SCOOP powder Take 17 g by mouth daily as needed. 507 g 1   sodium chloride (OCEAN) 0.65 % SOLN nasal spray Place 2 sprays into both nostrils as needed for congestion. 60 mL 3   Vitamin D, Ergocalciferol, (DRISDOL) 1.25 MG (50000 UNIT) CAPS capsule Take 1 capsule (50,000 Units total) by mouth every 7 (seven) days. 12 capsule 1   cefdinir (OMNICEF) 300 MG capsule Take 1 capsule (300 mg total) by mouth 2 (two) times daily. 14 capsule 0   YUPELRI 175 MCG/3ML nebulizer solution Take 175 mcg by nebulization daily.     No facility-administered medications prior to visit.    Allergies  Allergen Reactions   Prednisone Anxiety    ROS Review of Systems  Constitutional: Negative.   HENT:  Negative for ear pain, nosebleeds, postnasal drip, rhinorrhea, sinus pressure, sore throat, trouble swallowing and voice change.   Eyes:  Negative for visual disturbance.  Respiratory:  Positive for cough, shortness of breath and wheezing. Negative for apnea, choking, chest tightness and stridor.        Mucus is bubbles clear with green material  Cardiovascular: Negative.  Negative for chest pain, palpitations and leg swelling.  Gastrointestinal: Negative.  Negative for abdominal distention, abdominal pain, constipation, diarrhea, nausea and vomiting.  Genitourinary: Negative.   Musculoskeletal:  Positive for gait problem. Negative for arthralgias and myalgias.  Skin: Negative.  Negative for rash.  Allergic/Immunologic: Negative.  Negative for environmental allergies and food allergies.  Neurological:  Positive for weakness. Negative for dizziness, syncope and headaches.  Hematological: Negative.  Negative for adenopathy. Does not bruise/bleed easily.  Psychiatric/Behavioral: Negative.  Negative for agitation and sleep disturbance. The patient is not nervous/anxious.       Objective:    Physical Exam No  exam this is a phone visit There were no vitals taken for this visit. Wt Readings from Last 3 Encounters:  08/24/21 212 lb (96.2 kg)  06/28/21 217 lb (98.4 kg)  01/26/21 225 lb (102.1 kg)     Health Maintenance Due  Topic Date Due   Medicare Annual Wellness (AWV)  Never done   Zoster Vaccines- Shingrix (1 of 2) Never done   COVID-19 Vaccine (4 - 2023-24 season) 04/13/2022    There are no preventive care reminders to display for this patient.  No results found for: "TSH" Lab Results  Component Value Date   WBC 7.0 06/28/2021   HGB 11.2 06/28/2021   HCT 34.2 06/28/2021   MCV 86 06/28/2021   PLT 299 06/28/2021   Lab Results  Component Value Date   NA 145 (H) 06/28/2021   K 4.4 06/28/2021   CO2 29 06/28/2021   GLUCOSE 114 (H) 06/28/2021   BUN 11 06/28/2021   CREATININE 0.84 06/28/2021   BILITOT <0.2 06/28/2021   ALKPHOS 80 06/28/2021   AST 13 06/28/2021   ALT 9 06/28/2021   PROT 7.3 06/28/2021   ALBUMIN 4.7 06/28/2021   CALCIUM 9.8 06/28/2021   ANIONGAP 7 11/04/2018   EGFR 77 06/28/2021   Lab Results  Component Value Date   CHOL 343 (H) 06/28/2021  Lab Results  Component Value Date   HDL 135 06/28/2021   Lab Results  Component Value Date   LDLCALC 201 (H) 06/28/2021   Lab Results  Component Value Date   TRIG 61 06/28/2021   Lab Results  Component Value Date   CHOLHDL 2.5 06/28/2021   No results found for: "HGBA1C"    Assessment & Plan:   Problem List Items Addressed This Visit       Respiratory   COPD GOLD D (chronic obstructive pulmonary disease) (HCC) - Primary    Progressive end-stage COPD oxygen up to 4 L and on a conflicting treatment plan from what her discharge  I am recommending she stay on the Yupelri and take the Atrovent out of the albuterol nebulizer.  She is to stay on budesonide 0.5 mg twice daily by nebulizer and we need to introduce Perforomist formoterol in the nebulizer twice daily the albuterol would be as needed  She does  not tolerate prednisone well but I would recommend a course of antibiotics such as cefdinir which she will tolerate  Apparently the nurse practitioner at the nursing homes can get a chest x-ray and that is satisfactory      Relevant Medications   budesonide (PULMICORT) 0.5 MG/2ML nebulizer solution   Chronic respiratory failure with hypoxia and hypercapnia    Continue oxygen therapy     No orders of the defined types were placed in this encounter.   Follow Up Instructions: A prescription for the Perforomist will be sent over to the nursing home and a change in the nebulizer to albuterol alone as needed discussed potential assisted living I discussed the assessment and treatment plan with the patient. The patient was provided an opportunity to ask questions and all were answered. The patient agreed with the plan and demonstrated an understanding of the instructions.   The patient was advised to call back or seek an in-person evaluation if the symptoms worsen or if the condition fails to improve as anticipated.  I provided 30 minutes of non-face-to-face time during this encounter  including  median intraservice time , review of notes, labs, imaging, medications  and explaining diagnosis and management to the patient .    Shan Levans, MD

## 2022-11-21 NOTE — Telephone Encounter (Signed)
Noted  

## 2022-11-21 NOTE — Telephone Encounter (Signed)
Just convert her to a phone visit

## 2022-11-22 ENCOUNTER — Encounter: Payer: Self-pay | Admitting: Licensed Clinical Social Worker

## 2022-11-22 ENCOUNTER — Telehealth: Payer: Self-pay

## 2022-11-22 MED ORDER — CEFDINIR 300 MG PO CAPS: 300.0000 mg | ORAL_CAPSULE | Freq: Two times a day (BID) | ORAL | 0 refills | Status: DC

## 2022-11-22 MED ORDER — ALBUTEROL SULFATE (2.5 MG/3ML) 0.083% IN NEBU: INHALATION_SOLUTION | RESPIRATORY_TRACT | 2 refills | Status: DC

## 2022-11-22 MED ORDER — FORMOTEROL FUMARATE 20 MCG/2ML IN NEBU: 20.0000 ug | INHALATION_SOLUTION | Freq: Two times a day (BID) | RESPIRATORY_TRACT | 6 refills | Status: AC

## 2022-11-22 MED ORDER — YUPELRI 175 MCG/3ML IN SOLN: 175.0000 ug | Freq: Every day | RESPIRATORY_TRACT | 2 refills | Status: AC

## 2022-11-22 MED ORDER — BUDESONIDE 0.5 MG/2ML IN SUSP: 0.5000 mg | Freq: Two times a day (BID) | RESPIRATORY_TRACT | 1 refills | Status: AC

## 2022-11-22 NOTE — Telephone Encounter (Signed)
Orders for pulmonary medications faxed to [facility]

## 2022-11-22 NOTE — Patient Outreach (Signed)
  Care Coordination   Follow Up/Collaboration Visit Note   11/22/2022 Name: Davayah Pigman MRN: [id number] DOB: 03-Sep-1955  Amelya Bustle is a 67 y.o. year old female who sees Storm Frisk, MD for primary care.   What matters to the patients health and wellness today?  Patient was not engaged during this encounter    Goals Addressed             This Visit's Progress    Strengthening Support-Obtaining Independent Housing   On track    Activities and task to complete in order to accomplish goals.  Patient's daughter, Loma Newton, agreed to assist with the following goals: Patient will comply with medications prescribed by providers. Daughter provided meds to facility Patient will keep any upcoming appts  LCSW will collaborate with [facility] RNCM for additional patient care needs                    SDOH assessments and interventions completed:  No     Care Coordination Interventions:  Yes, provided   Follow up plan: Follow up call scheduled for 4 weeks    Encounter Outcome:  Pt. Visit Completed   Jenel Lucks, MSW, LCSW [facility] Care Management [facility]  [facility] [email or web address] Phone [phone] 4:21 PM

## 2022-11-22 NOTE — Patient Instructions (Signed)
Visit Information  Thank you for taking time to visit with me today. Please don't hesitate to contact me if I can be of assistance to you.   Following are the goals we discussed today:   Goals Addressed             This Visit's Progress    Strengthening Support-Obtaining Independent Housing   On track    Activities and task to complete in order to accomplish goals.  Patient's daughter, Loma Newton, agreed to assist with the following goals: Patient will comply with medications prescribed by providers. Daughter provided meds to facility Patient will keep any upcoming appts  LCSW will collaborate with [facility] RNCM for additional patient care needs                    Our next appointment is by telephone on 5/29 at 3 PM  Please call the care guide team at [phone] if you need to cancel or reschedule your appointment.   If you are experiencing a Mental Health or Behavioral Health Crisis or need someone to talk to, please call the Suicide and Crisis Lifeline: 988 call 911   Patient verbalizes understanding of instructions and care plan provided today and agrees to view in MyChart. Active MyChart status and patient understanding of how to access instructions and care plan via MyChart confirmed with patient.     Jenel Lucks, MSW, LCSW [facility] Care Management   [facility] [email or web address] Phone [phone] 4:23 PM

## 2022-11-22 NOTE — Assessment & Plan Note (Signed)
Continue oxygen therapy.

## 2022-11-22 NOTE — Assessment & Plan Note (Signed)
Progressive end-stage COPD oxygen up to 4 L and on a conflicting treatment plan from what her discharge  I am recommending she stay on the Yupelri and take the Atrovent out of the albuterol nebulizer.  She is to stay on budesonide 0.5 mg twice daily by nebulizer and we need to introduce Perforomist formoterol in the nebulizer twice daily the albuterol would be as needed  She does not tolerate prednisone well but I would recommend a course of antibiotics such as cefdinir which she will tolerate  Apparently the nurse practitioner at the nursing homes can get a chest x-ray and that is satisfactory

## 2022-11-23 ENCOUNTER — Ambulatory Visit: Payer: Self-pay | Admitting: *Deleted

## 2022-11-23 NOTE — Telephone Encounter (Signed)
Summary: Medication Advice   Darlina Guys DON Is calling for clarity of orders. Calling to report that Dr. Delford Field sent order to resume medications that facility medical dr d/c. Please advise      Please contact Darlina Guys, DON from [facility] [phone] to clarify medication orders.     Reason for Disposition  NON-URGENT call redirected to PCP's office because it is open  Answer Assessment - Initial Assessment Questions N/A No contact  Protocols used: No Contact or Duplicate Contact Call-A-AH

## 2022-11-26 NOTE — Telephone Encounter (Signed)
Call routed to Robyne Peers, Rn as Mrs. Clinton  voiced she has already spoke with Mrs.  Sydnee Levans

## 2022-11-26 NOTE — Telephone Encounter (Signed)
These were to continue after dc from hospital. I was told provider would not rx any of her pulm meds  I am her pcp and board certified in pulmonary medicine  The cefdinir is based on my virtual visit for bronchitis. She is high risk for readmission

## 2022-11-29 ENCOUNTER — Other Ambulatory Visit: Payer: Self-pay | Admitting: Critical Care Medicine

## 2022-12-02 ENCOUNTER — Inpatient Hospital Stay (HOSPITAL_COMMUNITY)
Admission: EM | Admit: 2022-12-02 | Discharge: 2022-12-04 | DRG: 190 | Disposition: A | Discharge status: Skilled Nursing Facility | Payer: 59 | Source: Skilled Nursing Facility | Attending: Internal Medicine | Admitting: Internal Medicine

## 2022-12-02 ENCOUNTER — Other Ambulatory Visit: Payer: Self-pay

## 2022-12-02 ENCOUNTER — Emergency Department (HOSPITAL_COMMUNITY): Payer: 59

## 2022-12-02 ENCOUNTER — Encounter (HOSPITAL_COMMUNITY): Payer: Self-pay

## 2022-12-02 DIAGNOSIS — J9622 Acute and chronic respiratory failure with hypercapnia: Secondary | ICD-10-CM | POA: Diagnosis not present

## 2022-12-02 DIAGNOSIS — J9621 Acute and chronic respiratory failure with hypoxia: Secondary | ICD-10-CM | POA: Diagnosis not present

## 2022-12-02 DIAGNOSIS — Z79899 Other long term (current) drug therapy: Secondary | ICD-10-CM

## 2022-12-02 DIAGNOSIS — M1990 Unspecified osteoarthritis, unspecified site: Secondary | ICD-10-CM | POA: Diagnosis present

## 2022-12-02 DIAGNOSIS — Z8249 Family history of ischemic heart disease and other diseases of the circulatory system: Secondary | ICD-10-CM

## 2022-12-02 DIAGNOSIS — J45901 Unspecified asthma with (acute) exacerbation: Secondary | ICD-10-CM | POA: Diagnosis present

## 2022-12-02 DIAGNOSIS — D638 Anemia in other chronic diseases classified elsewhere: Secondary | ICD-10-CM | POA: Diagnosis present

## 2022-12-02 DIAGNOSIS — Z9981 Dependence on supplemental oxygen: Secondary | ICD-10-CM

## 2022-12-02 DIAGNOSIS — F411 Generalized anxiety disorder: Secondary | ICD-10-CM | POA: Diagnosis present

## 2022-12-02 DIAGNOSIS — I10 Essential (primary) hypertension: Secondary | ICD-10-CM | POA: Diagnosis present

## 2022-12-02 DIAGNOSIS — J432 Centrilobular emphysema: Secondary | ICD-10-CM | POA: Diagnosis present

## 2022-12-02 DIAGNOSIS — Z888 Allergy status to other drugs, medicaments and biological substances status: Secondary | ICD-10-CM

## 2022-12-02 DIAGNOSIS — J441 Chronic obstructive pulmonary disease with (acute) exacerbation: Principal | ICD-10-CM

## 2022-12-02 DIAGNOSIS — R0789 Other chest pain: Secondary | ICD-10-CM | POA: Diagnosis present

## 2022-12-02 DIAGNOSIS — Z7951 Long term (current) use of inhaled steroids: Secondary | ICD-10-CM

## 2022-12-02 DIAGNOSIS — Z6837 Body mass index (BMI) 37.0-37.9, adult: Secondary | ICD-10-CM

## 2022-12-02 DIAGNOSIS — D649 Anemia, unspecified: Secondary | ICD-10-CM | POA: Diagnosis present

## 2022-12-02 DIAGNOSIS — Z825 Family history of asthma and other chronic lower respiratory diseases: Secondary | ICD-10-CM

## 2022-12-02 DIAGNOSIS — E669 Obesity, unspecified: Secondary | ICD-10-CM | POA: Diagnosis present

## 2022-12-02 DIAGNOSIS — K219 Gastro-esophageal reflux disease without esophagitis: Secondary | ICD-10-CM | POA: Diagnosis present

## 2022-12-02 DIAGNOSIS — Z87891 Personal history of nicotine dependence: Secondary | ICD-10-CM

## 2022-12-02 DIAGNOSIS — U071 COVID-19: Secondary | ICD-10-CM

## 2022-12-02 DIAGNOSIS — Z90710 Acquired absence of both cervix and uterus: Secondary | ICD-10-CM

## 2022-12-02 DIAGNOSIS — E782 Mixed hyperlipidemia: Secondary | ICD-10-CM | POA: Diagnosis present

## 2022-12-02 LAB — CBC WITH DIFFERENTIAL/PLATELET
Abs Immature Granulocytes: 0.01 10*3/uL (ref 0.00–0.07)
Basophils Absolute: 0.1 10*3/uL (ref 0.0–0.1)
Basophils Relative: 1 %
Eosinophils Absolute: 0.4 10*3/uL (ref 0.0–0.5)
Eosinophils Relative: 5 %
HCT: 39 % (ref 36.0–46.0)
Hemoglobin: 11.3 g/dL — ABNORMAL LOW (ref 12.0–15.0)
Immature Granulocytes: 0 %
Lymphocytes Relative: 41 %
Lymphs Abs: 3.4 10*3/uL (ref 0.7–4.0)
MCH: 28 pg (ref 26.0–34.0)
MCHC: 29 g/dL — ABNORMAL LOW (ref 30.0–36.0)
MCV: 96.5 fL (ref 80.0–100.0)
Monocytes Absolute: 0.7 10*3/uL (ref 0.1–1.0)
Monocytes Relative: 8 %
Neutro Abs: 3.6 10*3/uL (ref 1.7–7.7)
Neutrophils Relative %: 45 %
Platelets: 369 10*3/uL (ref 150–400)
RBC: 4.04 MIL/uL (ref 3.87–5.11)
RDW: 14.9 % (ref 11.5–15.5)
WBC: 8.1 10*3/uL (ref 4.0–10.5)
nRBC: 0 % (ref 0.0–0.2)

## 2022-12-02 LAB — COMPREHENSIVE METABOLIC PANEL
ALT: 12 U/L (ref 0–44)
AST: 16 U/L (ref 15–41)
Albumin: 4 g/dL (ref 3.5–5.0)
Alkaline Phosphatase: 60 U/L (ref 38–126)
Anion gap: 13 (ref 5–15)
BUN: 10 mg/dL (ref 8–23)
CO2: 32 mmol/L (ref 22–32)
Calcium: 9 mg/dL (ref 8.9–10.3)
Chloride: 96 mmol/L — ABNORMAL LOW (ref 98–111)
Creatinine, Ser: 0.74 mg/dL (ref 0.44–1.00)
GFR, Estimated: >60 mL/min (ref >60)
Glucose, Bld: 142 mg/dL — ABNORMAL HIGH (ref 70–99)
Potassium: 4.3 mmol/L (ref 3.5–5.1)
Sodium: 141 mmol/L (ref 135–145)
Total Bilirubin: 0.4 mg/dL (ref 0.3–1.2)
Total Protein: 7.6 g/dL (ref 6.5–8.1)

## 2022-12-02 LAB — PHOSPHORUS: Phosphorus: 2.5 mg/dL (ref 2.5–4.6)

## 2022-12-02 LAB — TROPONIN I (HIGH SENSITIVITY)
Troponin I (High Sensitivity): 9 ng/L (ref <18)
Troponin I (High Sensitivity): 8 ng/L (ref <18)

## 2022-12-02 MED ORDER — FENTANYL CITRATE PF 50 MCG/ML IJ SOSY
12.5000 ug | PREFILLED_SYRINGE | Freq: Once | INTRAMUSCULAR | Status: AC
  Administered 2022-12-02: 12.5 ug via INTRAVENOUS
  Filled 2022-12-02: qty 1

## 2022-12-02 MED ORDER — ACETAMINOPHEN 500 MG PO TABS
1000.0000 mg | ORAL_TABLET | Freq: Once | ORAL | Status: AC
  Administered 2022-12-02: 1000 mg via ORAL
  Filled 2022-12-02: qty 2

## 2022-12-02 MED ORDER — ALBUTEROL SULFATE (2.5 MG/3ML) 0.083% IN NEBU: 2.5000 mg | INHALATION_SOLUTION | RESPIRATORY_TRACT | Status: DC | PRN

## 2022-12-02 MED ORDER — KETOROLAC TROMETHAMINE 15 MG/ML IJ SOLN
15.0000 mg | Freq: Once | INTRAMUSCULAR | Status: AC
  Administered 2022-12-02: 15 mg via INTRAVENOUS
  Filled 2022-12-02: qty 1

## 2022-12-02 MED ORDER — METHYLPREDNISOLONE SODIUM SUCC 125 MG IJ SOLR
125.0000 mg | Freq: Once | INTRAMUSCULAR | Status: AC
  Administered 2022-12-02: 125 mg via INTRAVENOUS

## 2022-12-02 MED ORDER — CEFDINIR 300 MG PO CAPS
300.0000 mg | ORAL_CAPSULE | Freq: Two times a day (BID) | ORAL | Status: DC
  Administered 2022-12-02 – 2022-12-03 (×2): 300 mg via ORAL
  Filled 2022-12-02 (×2): qty 1

## 2022-12-02 MED ORDER — DILTIAZEM HCL ER COATED BEADS 120 MG PO CP24
120.0000 mg | ORAL_CAPSULE | Freq: Every day | ORAL | Status: DC
  Administered 2022-12-03 – 2022-12-04 (×2): 120 mg via ORAL
  Filled 2022-12-02 (×2): qty 1

## 2022-12-02 MED ORDER — ATORVASTATIN CALCIUM 10 MG PO TABS
10.0000 mg | ORAL_TABLET | Freq: Every evening | ORAL | Status: DC
  Administered 2022-12-02 – 2022-12-04 (×3): 10 mg via ORAL
  Filled 2022-12-02 (×3): qty 1

## 2022-12-02 MED ORDER — HYDROXYZINE HCL 25 MG PO TABS
25.0000 mg | ORAL_TABLET | Freq: Three times a day (TID) | ORAL | Status: DC | PRN
  Administered 2022-12-03 – 2022-12-04 (×6): 25 mg via ORAL
  Filled 2022-12-02 (×6): qty 1

## 2022-12-02 MED ORDER — BUSPIRONE HCL 5 MG PO TABS
10.0000 mg | ORAL_TABLET | Freq: Two times a day (BID) | ORAL | Status: DC
  Administered 2022-12-02 – 2022-12-04 (×4): 10 mg via ORAL
  Filled 2022-12-02 (×5): qty 2

## 2022-12-02 MED ORDER — FERROUS SULFATE 325 (65 FE) MG PO TABS
325.0000 mg | ORAL_TABLET | Freq: Every day | ORAL | Status: DC
  Administered 2022-12-03 – 2022-12-04 (×2): 325 mg via ORAL
  Filled 2022-12-02 (×2): qty 1

## 2022-12-02 MED ORDER — METHYLPREDNISOLONE SODIUM SUCC 125 MG IJ SOLR
INTRAMUSCULAR | Status: AC
  Filled 2022-12-02: qty 2

## 2022-12-02 MED ORDER — ONDANSETRON HCL 4 MG/2ML IJ SOLN: 4.0000 mg | Freq: Four times a day (QID) | INTRAMUSCULAR | Status: DC | PRN

## 2022-12-02 MED ORDER — IPRATROPIUM-ALBUTEROL 0.5-2.5 (3) MG/3ML IN SOLN
3.0000 mL | Freq: Four times a day (QID) | RESPIRATORY_TRACT | Status: DC
  Administered 2022-12-02: 3 mL via RESPIRATORY_TRACT
  Filled 2022-12-02: qty 3

## 2022-12-02 MED ORDER — ENOXAPARIN SODIUM 40 MG/0.4ML IJ SOSY
40.0000 mg | PREFILLED_SYRINGE | INTRAMUSCULAR | Status: DC
  Administered 2022-12-02 – 2022-12-03 (×2): 40 mg via SUBCUTANEOUS
  Filled 2022-12-02 (×2): qty 0.4

## 2022-12-02 MED ORDER — GABAPENTIN 100 MG PO CAPS
100.0000 mg | ORAL_CAPSULE | Freq: Three times a day (TID) | ORAL | Status: DC
  Administered 2022-12-02 – 2022-12-04 (×7): 100 mg via ORAL
  Filled 2022-12-02 (×7): qty 1

## 2022-12-02 MED ORDER — MAGNESIUM SULFATE 2 GM/50ML IV SOLN
2.0000 g | Freq: Once | INTRAVENOUS | Status: AC
  Administered 2022-12-02: 2 g via INTRAVENOUS

## 2022-12-02 MED ORDER — LORATADINE 10 MG PO TABS
10.0000 mg | ORAL_TABLET | Freq: Every day | ORAL | Status: DC
  Administered 2022-12-02 – 2022-12-04 (×3): 10 mg via ORAL
  Filled 2022-12-02 (×3): qty 1

## 2022-12-02 MED ORDER — ACETAMINOPHEN 650 MG RE SUPP: 650.0000 mg | Freq: Four times a day (QID) | RECTAL | Status: DC | PRN

## 2022-12-02 MED ORDER — FENTANYL CITRATE PF 50 MCG/ML IJ SOSY
25.0000 ug | PREFILLED_SYRINGE | Freq: Once | INTRAMUSCULAR | Status: AC
  Administered 2022-12-02: 25 ug via INTRAVENOUS
  Filled 2022-12-02: qty 1

## 2022-12-02 MED ORDER — DILTIAZEM HCL 30 MG PO TABS
30.0000 mg | ORAL_TABLET | Freq: Four times a day (QID) | ORAL | Status: AC
  Administered 2022-12-02 (×2): 30 mg via ORAL
  Filled 2022-12-02 (×2): qty 1

## 2022-12-02 MED ORDER — GUAIFENESIN-DM 100-10 MG/5ML PO SYRP
15.0000 mL | ORAL_SOLUTION | ORAL | Status: DC | PRN
  Administered 2022-12-02 – 2022-12-04 (×6): 15 mL via ORAL
  Filled 2022-12-02 (×8): qty 20

## 2022-12-02 MED ORDER — BUDESONIDE 0.5 MG/2ML IN SUSP
0.5000 mg | Freq: Two times a day (BID) | RESPIRATORY_TRACT | Status: DC
  Administered 2022-12-02 – 2022-12-04 (×4): 0.5 mg via RESPIRATORY_TRACT
  Filled 2022-12-02 (×5): qty 2

## 2022-12-02 MED ORDER — MAGNESIUM SULFATE 2 GM/50ML IV SOLN
INTRAVENOUS | Status: AC
  Administered 2022-12-02: 2 g via INTRAVENOUS
  Filled 2022-12-02: qty 50

## 2022-12-02 MED ORDER — PANTOPRAZOLE SODIUM 40 MG PO TBEC
40.0000 mg | DELAYED_RELEASE_TABLET | Freq: Two times a day (BID) | ORAL | Status: DC
  Administered 2022-12-02 – 2022-12-04 (×4): 40 mg via ORAL
  Filled 2022-12-02 (×4): qty 1

## 2022-12-02 MED ORDER — ALBUTEROL SULFATE (2.5 MG/3ML) 0.083% IN NEBU
5.0000 mg | INHALATION_SOLUTION | Freq: Once | RESPIRATORY_TRACT | Status: AC
  Administered 2022-12-02: 5 mg via RESPIRATORY_TRACT
  Filled 2022-12-02: qty 6

## 2022-12-02 MED ORDER — ACETAMINOPHEN 325 MG PO TABS
650.0000 mg | ORAL_TABLET | Freq: Four times a day (QID) | ORAL | Status: DC | PRN
  Administered 2022-12-04 (×2): 650 mg via ORAL
  Filled 2022-12-02 (×2): qty 2

## 2022-12-02 MED ORDER — HYDROXYZINE HCL 25 MG PO TABS
25.0000 mg | ORAL_TABLET | Freq: Once | ORAL | Status: AC
  Administered 2022-12-02: 25 mg via ORAL
  Filled 2022-12-02: qty 1

## 2022-12-02 MED ORDER — ONDANSETRON HCL 4 MG PO TABS: 4.0000 mg | ORAL_TABLET | Freq: Four times a day (QID) | ORAL | Status: DC | PRN

## 2022-12-02 MED ORDER — REVEFENACIN 175 MCG/3ML IN SOLN
175.0000 ug | Freq: Every day | RESPIRATORY_TRACT | Status: DC
  Administered 2022-12-03 – 2022-12-04 (×2): 175 ug via RESPIRATORY_TRACT
  Filled 2022-12-02 (×2): qty 3

## 2022-12-02 NOTE — Progress Notes (Signed)
Patient C/O mid chest pressure chest pain, Dr. Robb Matar notified, assessed patient, orders written for labs/EKG and pain medication, and given. Will continue to assess patient.

## 2022-12-02 NOTE — Progress Notes (Signed)
Bipap standby. Pt refused. Pt doesn't wear anything like that at home on 4L baseline. No resp distress noted. Pt placed on 4L with humidification and doing well. Machine remained bedside if needed.

## 2022-12-02 NOTE — H&P (Signed)
History and Physical    Patient: Bianca Murray ZOX:[id number] DOB: 1955/10/09 DOA: 12/02/2022 DOS: the patient was seen and examined on 12/02/2022 PCP: Storm Frisk, MD  Patient coming from: SNF  Chief Complaint:  Chief Complaint  Patient presents with   Shortness of Breath   HPI: Bianca Murray is a 67 y.o. female with medical history significant of osteoarthritis, asthma, emphysema, chronic respiratory failure with hypoxia And hypercapnia, history of endotracheal intubation followed by tracheostomy that is now reverses, into the emergency department due to anxiety, mostly nonproductive cough with occasional small amount of sputum, dyspnea and wheezing has been progressively worse since last night.  At the time of my examination, the patient also stated that she was having precordial, pressure-like chest pain, nonradiated that is worsened by cough and deep breaths.  No fever, chills or night sweats. No sore throat, rhinorrhea or hemoptysis.  No palpitations, diaphoresis, PND, orthopnea or pitting edema of the lower extremities. No appetite changes, abdominal pain, diarrhea, constipation, melena or hematochezia.  No flank pain, dysuria, frequency or hematuria.  No polyuria, polydipsia, polyphagia or blurred vision.  She has been very anxious.  ED course: Initial vital signs were temperature 97.5 F, pulse 131, respirations 30, BP 169/96 mmHg and O2 sat 97% on aerosol mask.  The patient was put on BiPAP ventilation.  She received magnesium sulfate 2 g IVPB, hydroxyzine 25 mg p.o. x 1, acetaminophen 1000 mg p.o. x 1, methylprednisolone 125 mg IVP and a 5 mg albuterol neb.  Lab work: CBC showed a white count of 8.1, hemoglobin 11.3 g/dL platelets 811.  CMP shows a chloride of 96 and glucose 142 mg/dL.  The rest of the CMP measurements were normal.  Imaging: Portable 1 view chest radiograph showed mildly increased bibasilar interstitial markings, which may reflect atelectasis or atypical/viral  infection.   Review of Systems: As mentioned in the history of present illness. All other systems reviewed and are negative. Past Medical History:  Diagnosis Date   Arthritis    Asthma    Cancer    Chronic respiratory failure with hypoxia and hypercapnia    COPD (chronic obstructive pulmonary disease)    Hypertension    Past Surgical History:  Procedure Laterality Date   ABDOMINAL HYSTERECTOMY     TRACHEOSTOMY     reversed   TRACHEOSTOMY CLOSURE     Social History:  reports that she has quit smoking. She has never used smokeless tobacco. She reports that she does not currently use alcohol. She reports that she does not currently use drugs.  Allergies  Allergen Reactions   Prednisone Anxiety    Family History  Problem Relation Age of Onset   Hypertension Mother    Heart disease Mother    Cancer Mother    Asthma Father    Asthma Sister    Hypertension Sister    Asthma Brother    Cancer Brother     Prior to Admission medications   Medication Sig Start Date End Date Taking? Authorizing Provider  albuterol (PROVENTIL) (2.5 MG/3ML) 0.083% nebulizer solution USE ONE VIAL (2.5 MG TOTAL) BY NEBULIZATION 4  TIMES DAILY as needed. 11/22/22   Storm Frisk, MD  albuterol (VENTOLIN HFA) 108 (90 Base) MCG/ACT inhaler INHALE 2 PUFFS INTO THE LUNGS EVERY 6 (SIX) HOURS AS NEEDED FOR WHEEZING OR SHORTNESS OF BREATH. 11/12/22   Storm Frisk, MD  amLODipine (NORVASC) 5 MG tablet TAKE 1 TABLET(5 MG) BY MOUTH DAILY 09/20/22   Storm Frisk, MD  atorvastatin (LIPITOR) 10 MG tablet Take 1 tablet (10 mg total) by mouth daily. 09/20/22   Storm Frisk, MD  azelastine (ASTELIN) 0.1 % nasal spray Place 2 sprays into both nostrils 2 (two) times daily. Use in each nostril as directed 09/28/21   Storm Frisk, MD  benzonatate (TESSALON) 100 MG capsule Take 1 capsule (100 mg total) by mouth 2 (two) times daily as needed for cough. 08/24/22   Marcine Matar, MD  Blood Pressure Monitoring  (BLOOD PRESSURE MONITOR AUTOMAT) DEVI Measure blood pressure and pulse daily 11/27/18   Storm Frisk, MD  budesonide (PULMICORT) 0.5 MG/2ML nebulizer solution Take 2 mLs (0.5 mg total) by nebulization 2 (two) times daily. 11/22/22 01/21/23  Storm Frisk, MD  cefdinir (OMNICEF) 300 MG capsule Take 1 capsule (300 mg total) by mouth 2 (two) times daily. 11/22/22   Storm Frisk, MD  cyclobenzaprine (FLEXERIL) 10 MG tablet TAKE 1 TABLET (10 MG TOTAL) BY MOUTH 3 (THREE) TIMES DAILY AS NEEDED FOR MUSCLE SPASMS. 11/29/22   Storm Frisk, MD  fluticasone (FLONASE) 50 MCG/ACT nasal spray Place 2 sprays into both nostrils daily. 12/05/21   Storm Frisk, MD  formoterol (PERFOROMIST) 20 MCG/2ML nebulizer solution Take 2 mLs (20 mcg total) by nebulization 2 (two) times daily. 11/22/22   Storm Frisk, MD  gabapentin (NEURONTIN) 100 MG capsule Take 1 capsule (100 mg total) by mouth 3 (three) times daily. 09/20/22   Storm Frisk, MD  hydrOXYzine (ATARAX) 25 MG tablet TAKE 1 TABLET (25 MG TOTAL) BY MOUTH 3 (THREE) TIMES DAILY AS NEEDED FOR ANXIETY. 11/29/22   Storm Frisk, MD  ibuprofen (ADVIL) 600 MG tablet TAKE 1 TABLET (600 MG) BY MOUTH EVERY 6 HOURS AS NEEDED 08/28/22   Storm Frisk, MD  loratadine (CLARITIN) 10 MG tablet TAKE ONE TABLET BY MOUTH ONCE DAILY (AM) 08/23/22   Storm Frisk, MD  losartan (COZAAR) 100 MG tablet Take 1 tablet (100 mg total) by mouth daily. 09/20/22   Storm Frisk, MD  Misc. Devices (PULSE OXIMETER FOR FINGER) MISC 1 application by Does not apply route daily as needed. 06/27/20   Storm Frisk, MD  Misc. Devices MISC Nebulizer machine, Large shower chair.  Diagnosis- chronic respiratory failure 02/24/20   Hoy Register, MD  Misc. Devices MISC Bedside commode. Diagnosis: COPD 07/03/22   Storm Frisk, MD  OXYGEN 4-5 Liter DAILY (route: Oxygen) 01/19/22   [provider]  pantoprazole (PROTONIX) 40 MG tablet TAKE 1 TABLET BY MOUTH 2  TIMES DAILY BEFORE A MEAL (AM+EVENING) 11/29/22   Storm Frisk, MD  polyethylene glycol powder (GLYCOLAX/MIRALAX) 17 GM/SCOOP powder Take 17 g by mouth daily as needed. 06/28/21   Storm Frisk, MD  sodium chloride (OCEAN) 0.65 % SOLN nasal spray Place 2 sprays into both nostrils as needed for congestion. 05/08/22   Storm Frisk, MD  Vitamin D, Ergocalciferol, (DRISDOL) 1.25 MG (50000 UNIT) CAPS capsule Take 1 capsule (50,000 Units total) by mouth every 7 (seven) days. 09/20/22   Storm Frisk, MD  YUPELRI 175 MCG/3ML nebulizer solution Take 3 mLs (175 mcg total) by nebulization daily. 11/22/22   Storm Frisk, MD    Physical Exam: Vitals:   12/02/22 1330 12/02/22 1345 12/02/22 1400 12/02/22 1415  BP: 112/85 126/86 111/78 130/81  Pulse: (!) 104 (!) 114 (!) 101 (!) 108  Resp: 19 17 18  (!) 22  Temp:      TempSrc:  SpO2: 97% 97% 96% 96%  Weight:      Height:       Physical Exam Vitals and nursing note reviewed.  Constitutional:      General: She is awake. She is not in acute distress.    Appearance: She is well-developed. She is obese.     Interventions: Nasal cannula in place.  HENT:     Head: Normocephalic.     Nose: No rhinorrhea.     Mouth/Throat:     Mouth: Mucous membranes are dry.  Eyes:     General: No scleral icterus.    Pupils: Pupils are equal, round, and reactive to light.  Neck:     Vascular: No JVD.  Cardiovascular:     Rate and Rhythm: Regular rhythm. Tachycardia present.     Heart sounds: S1 normal and S2 normal.  Pulmonary:     Effort: No tachypnea or accessory muscle usage.     Breath sounds: Decreased breath sounds, wheezing and rhonchi present. No rales.  Abdominal:     General: Bowel sounds are normal. There is no distension.     Palpations: Abdomen is soft.     Tenderness: There is no right CVA tenderness or left CVA tenderness.  Musculoskeletal:     Cervical back: Neck supple.     Right lower leg: No edema.     Left lower leg:  No edema.  Skin:    General: Skin is warm and dry.  Neurological:     General: No focal deficit present.     Mental Status: She is alert and oriented to person, place, and time.  Psychiatric:        Attention and Perception: Attention normal.        Mood and Affect: Affect normal. Mood is anxious.        Behavior: Behavior normal. Behavior is cooperative.   Data Reviewed:  Results are pending, will review when available.  Vent. rate 145 BPM PR interval 92 ms QRS duration 66 ms QT/QTcB 264/410 ms P-R-T axes 93 80 9 Sinus tachycardia Low voltage, precordial leads Borderline T wave abnormalities  Assessment and Plan: Principal Problem:   Acute on chronic respiratory failure with hypoxia and hypercapnia In the setting of:   COPD with acute exacerbation   Centrilobular emphysema Observation/telemetry Continue supplemental oxygen. BiPAP ventilation as needed. Methylprednisolone 125 mg IVP x1 given. Followed by budesonide nebs twice daily. Prednisone causes severe anxiety. Scheduled and as needed bronchodilators. Follow-up CBC and chemistry in the morning.   Active Problems:   Atypical chest pain Check EKG. Check troponin level x 2. Check echocardiogram in AM. Toradol 15 mg IVP x 1. Fentanyl 25 mcg IVP x 1.    Hypertension No longer taking amlodipine. Will not resume due to reflex tachycardia. Will start her on diltiazem 30 mg p.o. q6 hr x 2. Then begin diltiazem CD 120 mg p.o. daily in a.m. Monitor blood pressure and heart rate. The patient also takes iron supplement. Monitor closely for constipation.    Generalized anxiety disorder Begin BuSpar 10 mg p.o. twice daily. On the continue hydroxyzine as needed.    Mixed hyperlipidemia Continue atorvastatin 10 mg p.o. every evening.    Normocytic anemia Monitor hematocrit and hemoglobin. Continue iron supplementation.    GERD (gastroesophageal reflux disease) Continue pantoprazole 40 mg p.o. twice daily.     Advance Care Planning:   Code Status: Full Code   Consults:   Family Communication:   Severity of Illness: The appropriate  patient status for this patient is OBSERVATION. Observation status is judged to be reasonable and necessary in order to provide the required intensity of service to ensure the patient's safety. The patient's presenting symptoms, physical exam findings, and initial radiographic and laboratory data in the context of their medical condition is felt to place them at decreased risk for further clinical deterioration. Furthermore, it is anticipated that the patient will be medically stable for discharge from the hospital within 2 midnights of admission.   Author: Bobette Mo, MD 12/02/2022 2:31 PM  For on call review [email or web address].   This document was prepared using Dragon voice recognition software and may contain some unintended transcription errors.

## 2022-12-02 NOTE — Progress Notes (Signed)
Patient have been yellow MEWs in the ED due to her heart rate and respiration. Initiated yellow MEWs protocol.    12/02/22 1614  Assess: MEWS Score  Temp 98.7 F (37.1 C)  BP (!) 130/99  MAP (mmHg) 109  Pulse Rate (!) 112  Resp (!) 21  Level of Consciousness Alert  SpO2 97 %  O2 Device Nasal Cannula  O2 Flow Rate (L/min) 4 L/min  Assess: MEWS Score  MEWS Temp 0  MEWS Systolic 0  MEWS Pulse 2  MEWS RR 1  MEWS LOC 0  MEWS Score 3  MEWS Score Color Yellow  Assess: if the MEWS score is Yellow or Red  Were vital signs taken at a resting state? Yes  Focused Assessment No change from prior assessment  Does the patient meet 2 or more of the SIRS criteria? Yes  Does the patient have a confirmed or suspected source of infection? No  MEWS guidelines implemented  Yes, yellow  Treat  MEWS Interventions Considered administering scheduled or prn medications/treatments as ordered  Take Vital Signs  Increase Vital Sign Frequency  Yellow: Q2hr x1, continue Q4hrs until patient remains green for 12hrs  Escalate  MEWS: Escalate Yellow: Discuss with charge nurse and consider notifying provider and/or RRT  Notify: Charge Nurse/RN  Name of Charge Nurse/RN Notified Abby Dicky, RN  Assess: SIRS CRITERIA  SIRS Temperature  0  SIRS Pulse 1  SIRS Respirations  1  SIRS WBC 0  SIRS Score Sum  2

## 2022-12-02 NOTE — ED Notes (Signed)
ED TO INPATIENT HANDOFF REPORT  Name/Age/Gender Bianca Murray 67 y.o. female  Code Status Code Status History     Date Active Date Inactive Code Status Order ID Comments User Context   10/28/2018 0428 11/05/2018 1632 Full Code [id number]  Hillary Bow, DO ED       Home/SNF/Other Skilled nursing facility  Chief Complaint Acute on chronic respiratory failure with hypoxia and hypercapnia [J96.21, J96.22]  Level of Care/Admitting Diagnosis ED Disposition     ED Disposition  Admit   Condition  --   Comment  Hospital Area: [facility] [[id number]]  Level of Care: Progressive [102]  Admit to Progressive based on following criteria: RESPIRATORY PROBLEMS hypoxemic/hypercapnic respiratory failure that is responsive to NIPPV (BiPAP) or High Flow Nasal Cannula (6-80 lpm). Frequent assessment/intervention, no > Q2 hrs < Q4 hrs, to maintain oxygenation and pulmonary hygiene.  May place patient in observation at [facility] or [facility] if equivalent level of care is available:: No  Covid Evaluation: Asymptomatic - no recent exposure (last 10 days) testing not required  Diagnosis: Acute on chronic respiratory failure with hypoxia and hypercapnia [8295621]  Admitting Physician: Bobette Mo [[id number]]  Attending Physician: Bobette Mo [[id number]]          Medical History Past Medical History:  Diagnosis Date   Arthritis    Asthma    Cancer    Chronic respiratory failure with hypoxia and hypercapnia    COPD (chronic obstructive pulmonary disease)    Hypertension     Allergies Allergies  Allergen Reactions   Prednisone Anxiety    IV Location/Drains/Wounds Patient Lines/Drains/Airways Status     Active Line/Drains/Airways     Name Placement date Placement time Site Days   Peripheral IV 12/02/22 20 G Anterior;Right Forearm 12/02/22  1058  Forearm  less than 1   Peripheral IV 12/02/22 20 G Anterior;Left Forearm 12/02/22  1104  Forearm  less  than 1            Labs/Imaging Results for orders placed or performed during the hospital encounter of 12/02/22 (from the past 48 hour(s))  Comprehensive metabolic panel     Status: Abnormal   Collection Time: 12/02/22 11:01 AM  Result Value Ref Range   Sodium 141 135 - 145 mmol/L   Potassium 4.3 3.5 - 5.1 mmol/L   Chloride 96 (L) 98 - 111 mmol/L   CO2 32 22 - 32 mmol/L   Glucose, Bld 142 (H) 70 - 99 mg/dL    Comment: Glucose reference range applies only to samples taken after fasting for at least 8 hours.   BUN 10 8 - 23 mg/dL   Creatinine, Ser 2.84 0.44 - 1.00 mg/dL   Calcium 9.0 8.9 - 13.2 mg/dL   Total Protein 7.6 6.5 - 8.1 g/dL   Albumin 4.0 3.5 - 5.0 g/dL   AST 16 15 - 41 U/L   ALT 12 0 - 44 U/L   Alkaline Phosphatase 60 38 - 126 U/L   Total Bilirubin 0.4 0.3 - 1.2 mg/dL   GFR, Estimated >44 >01 mL/min    Comment: (NOTE) Calculated using the CKD-EPI Creatinine Equation (2021)    Anion gap 13 5 - 15    Comment: Performed at [facility], [address] [place], [place] [address]  CBC with Differential     Status: Abnormal   Collection Time: 12/02/22 11:01 AM  Result Value Ref Range   WBC 8.1 4.0 - 10.5 K/uL  RBC 4.04 3.87 - 5.11 MIL/uL   Hemoglobin 11.3 (L) 12.0 - 15.0 g/dL   HCT 16.1 09.6 - 04.5 %   MCV 96.5 80.0 - 100.0 fL   MCH 28.0 26.0 - 34.0 pg   MCHC 29.0 (L) 30.0 - 36.0 g/dL   RDW 40.9 81.1 - 91.4 %   Platelets 369 150 - 400 K/uL   nRBC 0.0 0.0 - 0.2 %   Neutrophils Relative % 45 %   Neutro Abs 3.6 1.7 - 7.7 K/uL   Lymphocytes Relative 41 %   Lymphs Abs 3.4 0.7 - 4.0 K/uL   Monocytes Relative 8 %   Monocytes Absolute 0.7 0.1 - 1.0 K/uL   Eosinophils Relative 5 %   Eosinophils Absolute 0.4 0.0 - 0.5 K/uL   Basophils Relative 1 %   Basophils Absolute 0.1 0.0 - 0.1 K/uL   Immature Granulocytes 0 %   Abs Immature Granulocytes 0.01 0.00 - 0.07 K/uL    Comment: Performed at [facility], [address]  [place], [place] [address]   DG Chest Portable 1 View  Result Date: 12/02/2022 CLINICAL DATA:  Shortness of breath EXAM: PORTABLE CHEST 1 VIEW COMPARISON:  10/29/2018 FINDINGS: The heart size and mediastinal contours are within normal limits. Mildly increased bibasilar interstitial markings. No appreciable pleural fluid collection. No pneumothorax. The visualized skeletal structures are unremarkable. IMPRESSION: Mildly increased bibasilar interstitial markings, which may reflect atelectasis or atypical/viral infection. Electronically Signed   By: Duanne Guess D.O.   On: 12/02/2022 11:21    Pending Labs Unresulted Labs (From admission, onward)    None       Vitals/Pain Today's Vitals   12/02/22 1415 12/02/22 1430 12/02/22 1445 12/02/22 1449  BP: 130/81 (!) 135/90 123/77   Pulse: (!) 108 (!) 108 (!) 106   Resp: (!) 22 (!) 22 16   Temp:    97.9 F (36.6 C)  TempSrc:    Oral  SpO2: 96% 99% 97%   Weight:      Height:      PainSc:        Isolation Precautions No active isolations  Medications Medications  methylPREDNISolone sodium succinate (SOLU-MEDROL) 125 mg/2 mL injection 125 mg (125 mg Intravenous Given 12/02/22 1058)  magnesium sulfate IVPB 2 g 50 mL (0 g Intravenous Stopped 12/02/22 1213)  acetaminophen (TYLENOL) tablet 1,000 mg (1,000 mg Oral Given 12/02/22 1213)  hydrOXYzine (ATARAX) tablet 25 mg (25 mg Oral Given 12/02/22 1257)  albuterol (PROVENTIL) (2.5 MG/3ML) 0.083% nebulizer solution 5 mg (5 mg Nebulization Given 12/02/22 1429)    Mobility walks

## 2022-12-02 NOTE — ED Notes (Signed)
BiPAP placed on patient due to breathing difficulty.

## 2022-12-02 NOTE — ED Provider Notes (Signed)
[facility] AT [facility] Provider Note   CSN: [id number] Arrival date & time: 12/02/22  1051     History  Chief Complaint  Patient presents with   Shortness of Breath    Bianca Murray is a 67 y.o. female.   Shortness of Breath Patient presents with shortness of breath.  On chronic oxygen.  Became more short of breath.  Reportedly had been given some anxiety medicines by nursing home.  EMS unable to get IV access and was given IM epi.  Still dyspnea.  Had been given breathing treatment.  Had reportedly refused CPAP.  Discussed with patient and if needed would want intubation.  Cannot really provide much history however due to dyspnea.    Past Medical History:  Diagnosis Date   Arthritis    Asthma    Cancer    Chronic respiratory failure with hypoxia and hypercapnia    COPD (chronic obstructive pulmonary disease)    Hypertension     Home Medications Prior to Admission medications   Medication Sig Start Date End Date Taking? Authorizing Provider  albuterol (PROVENTIL) (2.5 MG/3ML) 0.083% nebulizer solution USE ONE VIAL (2.5 MG TOTAL) BY NEBULIZATION 4  TIMES DAILY as needed. 11/22/22   Storm Frisk, MD  albuterol (VENTOLIN HFA) 108 (90 Base) MCG/ACT inhaler INHALE 2 PUFFS INTO THE LUNGS EVERY 6 (SIX) HOURS AS NEEDED FOR WHEEZING OR SHORTNESS OF BREATH. 11/12/22   Storm Frisk, MD  amLODipine (NORVASC) 5 MG tablet TAKE 1 TABLET(5 MG) BY MOUTH DAILY 09/20/22   Storm Frisk, MD  atorvastatin (LIPITOR) 10 MG tablet Take 1 tablet (10 mg total) by mouth daily. 09/20/22   Storm Frisk, MD  azelastine (ASTELIN) 0.1 % nasal spray Place 2 sprays into both nostrils 2 (two) times daily. Use in each nostril as directed 09/28/21   Storm Frisk, MD  benzonatate (TESSALON) 100 MG capsule Take 1 capsule (100 mg total) by mouth 2 (two) times daily as needed for cough. 08/24/22   Marcine Matar, MD  Blood Pressure Monitoring (BLOOD PRESSURE  MONITOR AUTOMAT) DEVI Measure blood pressure and pulse daily 11/27/18   Storm Frisk, MD  budesonide (PULMICORT) 0.5 MG/2ML nebulizer solution Take 2 mLs (0.5 mg total) by nebulization 2 (two) times daily. 11/22/22 01/21/23  Storm Frisk, MD  cefdinir (OMNICEF) 300 MG capsule Take 1 capsule (300 mg total) by mouth 2 (two) times daily. 11/22/22   Storm Frisk, MD  cyclobenzaprine (FLEXERIL) 10 MG tablet TAKE 1 TABLET (10 MG TOTAL) BY MOUTH 3 (THREE) TIMES DAILY AS NEEDED FOR MUSCLE SPASMS. 11/29/22   Storm Frisk, MD  fluticasone (FLONASE) 50 MCG/ACT nasal spray Place 2 sprays into both nostrils daily. 12/05/21   Storm Frisk, MD  formoterol (PERFOROMIST) 20 MCG/2ML nebulizer solution Take 2 mLs (20 mcg total) by nebulization 2 (two) times daily. 11/22/22   Storm Frisk, MD  gabapentin (NEURONTIN) 100 MG capsule Take 1 capsule (100 mg total) by mouth 3 (three) times daily. 09/20/22   Storm Frisk, MD  hydrOXYzine (ATARAX) 25 MG tablet TAKE 1 TABLET (25 MG TOTAL) BY MOUTH 3 (THREE) TIMES DAILY AS NEEDED FOR ANXIETY. 11/29/22   Storm Frisk, MD  ibuprofen (ADVIL) 600 MG tablet TAKE 1 TABLET (600 MG) BY MOUTH EVERY 6 HOURS AS NEEDED 08/28/22   Storm Frisk, MD  loratadine (CLARITIN) 10 MG tablet TAKE ONE TABLET BY MOUTH ONCE DAILY (AM) 08/23/22  Storm Frisk, MD  losartan (COZAAR) 100 MG tablet Take 1 tablet (100 mg total) by mouth daily. 09/20/22   Storm Frisk, MD  Misc. Devices (PULSE OXIMETER FOR FINGER) MISC 1 application by Does not apply route daily as needed. 06/27/20   Storm Frisk, MD  Misc. Devices MISC Nebulizer machine, Large shower chair.  Diagnosis- chronic respiratory failure 02/24/20   Hoy Register, MD  Misc. Devices MISC Bedside commode. Diagnosis: COPD 07/03/22   Storm Frisk, MD  OXYGEN 4-5 Liter DAILY (route: Oxygen) 01/19/22   [provider]  pantoprazole (PROTONIX) 40 MG tablet TAKE 1 TABLET BY MOUTH 2 TIMES DAILY BEFORE  A MEAL (AM+EVENING) 11/29/22   Storm Frisk, MD  polyethylene glycol powder (GLYCOLAX/MIRALAX) 17 GM/SCOOP powder Take 17 g by mouth daily as needed. 06/28/21   Storm Frisk, MD  sodium chloride (OCEAN) 0.65 % SOLN nasal spray Place 2 sprays into both nostrils as needed for congestion. 05/08/22   Storm Frisk, MD  Vitamin D, Ergocalciferol, (DRISDOL) 1.25 MG (50000 UNIT) CAPS capsule Take 1 capsule (50,000 Units total) by mouth every 7 (seven) days. 09/20/22   Storm Frisk, MD  YUPELRI 175 MCG/3ML nebulizer solution Take 3 mLs (175 mcg total) by nebulization daily. 11/22/22   Storm Frisk, MD      Allergies    Prednisone    Review of Systems   Review of Systems  Respiratory:  Positive for shortness of breath.     Physical Exam Updated Vital Signs BP (!) 144/86   Pulse (!) 111   Temp (!) 97.5 F (36.4 C) (Oral)   Resp (!) 24   Ht  (1.6 m)   Wt 96.2 kg   SpO2 97%   BMI 37.57 kg/m  Physical Exam Vitals and nursing note reviewed.  Cardiovascular:     Rate and Rhythm: Tachycardia present.  Pulmonary:     Breath sounds: Decreased breath sounds present.     Comments: Decreased breath sounds throughout with tachypnea. Musculoskeletal:     Right lower leg: No tenderness.     Left lower leg: No tenderness.  Neurological:     Mental Status: She is alert.   Patient with scar on back from previous trach.  ED Results / Procedures / Treatments   Labs (all labs ordered are listed, but only abnormal results are displayed) Labs Reviewed  COMPREHENSIVE METABOLIC PANEL - Abnormal; Notable for the following components:      Result Value   Chloride 96 (*)    Glucose, Bld 142 (*)    All other components within normal limits  CBC WITH DIFFERENTIAL/PLATELET - Abnormal; Notable for the following components:   Hemoglobin 11.3 (*)    MCHC 29.0 (*)    All other components within normal limits    EKG EKG Interpretation  Date/Time:  Sunday December 02 2022 11:00:10  EDT Ventricular Rate:  145 PR Interval:  92 QRS Duration: 66 QT Interval:  264 QTC Calculation: 410 R Axis:   80 Text Interpretation: Sinus tachycardia Low voltage, precordial leads Borderline T wave abnormalities Confirmed by Benjiman Core [phone]) on 12/02/2022 11:08:37 AM  Radiology DG Chest Portable 1 View  Result Date: 12/02/2022 CLINICAL DATA:  Shortness of breath EXAM: PORTABLE CHEST 1 VIEW COMPARISON:  10/29/2018 FINDINGS: The heart size and mediastinal contours are within normal limits. Mildly increased bibasilar interstitial markings. No appreciable pleural fluid collection. No pneumothorax. The visualized skeletal structures are unremarkable. IMPRESSION: Mildly increased bibasilar  interstitial markings, which may reflect atelectasis or atypical/viral infection. Electronically Signed   By: Duanne Guess D.O.   On: 12/02/2022 11:21    Procedures Procedures    Medications Ordered in ED Medications  albuterol (PROVENTIL) (2.5 MG/3ML) 0.083% nebulizer solution 5 mg (has no administration in time range)  methylPREDNISolone sodium succinate (SOLU-MEDROL) 125 mg/2 mL injection 125 mg (125 mg Intravenous Given 12/02/22 1058)  magnesium sulfate IVPB 2 g 50 mL (0 g Intravenous Stopped 12/02/22 1213)  acetaminophen (TYLENOL) tablet 1,000 mg (1,000 mg Oral Given 12/02/22 1213)  hydrOXYzine (ATARAX) tablet 25 mg (25 mg Oral Given 12/02/22 1257)    ED Course/ Medical Decision Making/ A&P                             Medical Decision Making Amount and/or Complexity of Data Reviewed Labs: ordered. Radiology: ordered.  Risk OTC drugs. Prescription drug management.   Patient with severe dyspnea and shortness of breath, however oxygenation maintained.  On chronic oxygen.  Able to get IV access.  Given steroids and magnesium.  Patient feeling much better on BiPAP.  Will get x-ray and basic blood work.  I reviewed recent discharge note for respiratory failure.  X-ray reassuring.   Breathing is improved somewhat markedly was initially started on BiPAP and has improved and is off that however still some tachycardia and still feels dyspneic.  Still some tachypnea.  With persistent tachypnea and tachycardia I feel she would benefit from mission the hospital but actually with the severity of her initial dyspnea.  Will discuss with hospitalist.  CRITICAL CARE Performed by: Benjiman Core Total critical care time: 30 minutes Critical care time was exclusive of separately billable procedures and treating other patients. Critical care was necessary to treat or prevent imminent or life-threatening deterioration. Critical care was time spent personally by me on the following activities: development of treatment plan with patient and/or surrogate as well as nursing, discussions with consultants, evaluation of patient's response to treatment, examination of patient, obtaining history from patient or surrogate, ordering and performing treatments and interventions, ordering and review of laboratory studies, ordering and review of radiographic studies, pulse oximetry and re-evaluation of patient's condition.         Final Clinical Impression(s) / ED Diagnoses Final diagnoses:  COPD exacerbation    Rx / DC Orders ED Discharge Orders     None         Benjiman Core, MD 12/02/22 1414

## 2022-12-02 NOTE — ED Triage Notes (Signed)
Patient brought in by EMS due to shortness of breath. EMS reports getting to patient and she was in the tripod position and having difficulty breathing. Facility gave hydroxazine and albuterol treatment prior to EMS arrival. EMS administered a dubneb in route and also gave 0.3mg  epi IM. Pt has history of breathing issues and had to be trach'ed in the past.

## 2022-12-03 ENCOUNTER — Observation Stay (HOSPITAL_COMMUNITY): Payer: 59

## 2022-12-03 DIAGNOSIS — Z6837 Body mass index (BMI) 37.0-37.9, adult: Secondary | ICD-10-CM | POA: Diagnosis not present

## 2022-12-03 DIAGNOSIS — Z79899 Other long term (current) drug therapy: Secondary | ICD-10-CM | POA: Diagnosis not present

## 2022-12-03 DIAGNOSIS — R9431 Abnormal electrocardiogram [ECG] [EKG]: Secondary | ICD-10-CM | POA: Diagnosis not present

## 2022-12-03 DIAGNOSIS — Z7951 Long term (current) use of inhaled steroids: Secondary | ICD-10-CM | POA: Diagnosis not present

## 2022-12-03 DIAGNOSIS — D638 Anemia in other chronic diseases classified elsewhere: Secondary | ICD-10-CM | POA: Diagnosis present

## 2022-12-03 DIAGNOSIS — J9621 Acute and chronic respiratory failure with hypoxia: Secondary | ICD-10-CM | POA: Diagnosis present

## 2022-12-03 DIAGNOSIS — J9622 Acute and chronic respiratory failure with hypercapnia: Secondary | ICD-10-CM | POA: Diagnosis not present

## 2022-12-03 DIAGNOSIS — I10 Essential (primary) hypertension: Secondary | ICD-10-CM | POA: Diagnosis present

## 2022-12-03 DIAGNOSIS — Z888 Allergy status to other drugs, medicaments and biological substances status: Secondary | ICD-10-CM | POA: Diagnosis not present

## 2022-12-03 DIAGNOSIS — J45901 Unspecified asthma with (acute) exacerbation: Secondary | ICD-10-CM | POA: Diagnosis present

## 2022-12-03 DIAGNOSIS — Z87891 Personal history of nicotine dependence: Secondary | ICD-10-CM | POA: Diagnosis not present

## 2022-12-03 DIAGNOSIS — K219 Gastro-esophageal reflux disease without esophagitis: Secondary | ICD-10-CM | POA: Diagnosis present

## 2022-12-03 DIAGNOSIS — E782 Mixed hyperlipidemia: Secondary | ICD-10-CM | POA: Diagnosis present

## 2022-12-03 DIAGNOSIS — F411 Generalized anxiety disorder: Secondary | ICD-10-CM | POA: Diagnosis present

## 2022-12-03 DIAGNOSIS — R079 Chest pain, unspecified: Secondary | ICD-10-CM

## 2022-12-03 DIAGNOSIS — E669 Obesity, unspecified: Secondary | ICD-10-CM | POA: Diagnosis present

## 2022-12-03 DIAGNOSIS — Z825 Family history of asthma and other chronic lower respiratory diseases: Secondary | ICD-10-CM | POA: Diagnosis not present

## 2022-12-03 DIAGNOSIS — Z8249 Family history of ischemic heart disease and other diseases of the circulatory system: Secondary | ICD-10-CM | POA: Diagnosis not present

## 2022-12-03 DIAGNOSIS — Z90710 Acquired absence of both cervix and uterus: Secondary | ICD-10-CM | POA: Diagnosis not present

## 2022-12-03 DIAGNOSIS — J441 Chronic obstructive pulmonary disease with (acute) exacerbation: Secondary | ICD-10-CM | POA: Diagnosis present

## 2022-12-03 DIAGNOSIS — M1990 Unspecified osteoarthritis, unspecified site: Secondary | ICD-10-CM | POA: Diagnosis present

## 2022-12-03 DIAGNOSIS — Z9981 Dependence on supplemental oxygen: Secondary | ICD-10-CM | POA: Diagnosis not present

## 2022-12-03 DIAGNOSIS — J432 Centrilobular emphysema: Secondary | ICD-10-CM | POA: Diagnosis present

## 2022-12-03 LAB — COMPREHENSIVE METABOLIC PANEL
ALT: 12 U/L (ref 0–44)
AST: 11 U/L — ABNORMAL LOW (ref 15–41)
Albumin: 3.3 g/dL — ABNORMAL LOW (ref 3.5–5.0)
Alkaline Phosphatase: 51 U/L (ref 38–126)
Anion gap: 8 (ref 5–15)
BUN: 16 mg/dL (ref 8–23)
CO2: 32 mmol/L (ref 22–32)
Calcium: 9 mg/dL (ref 8.9–10.3)
Chloride: 99 mmol/L (ref 98–111)
Creatinine, Ser: 0.78 mg/dL (ref 0.44–1.00)
GFR, Estimated: >60 mL/min (ref >60)
Glucose, Bld: 141 mg/dL — ABNORMAL HIGH (ref 70–99)
Potassium: 4.3 mmol/L (ref 3.5–5.1)
Sodium: 139 mmol/L (ref 135–145)
Total Bilirubin: 0.4 mg/dL (ref 0.3–1.2)
Total Protein: 6.4 g/dL — ABNORMAL LOW (ref 6.5–8.1)

## 2022-12-03 LAB — ECHOCARDIOGRAM COMPLETE
Area-P 1/2: 2.88 cm2
Calc EF: 57.4 %
Height: 63 in
S' Lateral: 3.2 cm
Single Plane A2C EF: 58.9 %
Single Plane A4C EF: 57 %
Weight: 3336.88 oz

## 2022-12-03 LAB — CBC
HCT: 32.8 % — ABNORMAL LOW (ref 36.0–46.0)
Hemoglobin: 9.8 g/dL — ABNORMAL LOW (ref 12.0–15.0)
MCH: 28.4 pg (ref 26.0–34.0)
MCHC: 29.9 g/dL — ABNORMAL LOW (ref 30.0–36.0)
MCV: 95.1 fL (ref 80.0–100.0)
Platelets: 297 10*3/uL (ref 150–400)
RBC: 3.45 MIL/uL — ABNORMAL LOW (ref 3.87–5.11)
RDW: 14.9 % (ref 11.5–15.5)
WBC: 5.4 10*3/uL (ref 4.0–10.5)
nRBC: 0 % (ref 0.0–0.2)

## 2022-12-03 LAB — HEMOGLOBIN A1C
Hgb A1c MFr Bld: 5.3 % (ref 4.8–5.6)
Mean Plasma Glucose: 105.41 mg/dL

## 2022-12-03 LAB — HIV ANTIBODY (ROUTINE TESTING W REFLEX): HIV Screen 4th Generation wRfx: NONREACTIVE

## 2022-12-03 MED ORDER — METHYLPREDNISOLONE 4 MG PO TABS
4.0000 mg | ORAL_TABLET | Freq: Every day | ORAL | Status: DC
  Administered 2022-12-03 – 2022-12-04 (×2): 4 mg via ORAL
  Filled 2022-12-03: qty 2
  Filled 2022-12-03: qty 1

## 2022-12-03 MED ORDER — PERFLUTREN LIPID MICROSPHERE
1.0000 mL | INTRAVENOUS | Status: AC | PRN
  Administered 2022-12-03: 2 mL via INTRAVENOUS

## 2022-12-03 MED ORDER — METHYLPREDNISOLONE 2 MG PO TABS: 2.0000 mg | ORAL_TABLET | Freq: Every day | ORAL | Status: DC

## 2022-12-03 MED ORDER — BENZONATATE 100 MG PO CAPS
100.0000 mg | ORAL_CAPSULE | Freq: Three times a day (TID) | ORAL | Status: DC | PRN
  Administered 2022-12-03: 100 mg via ORAL
  Filled 2022-12-03: qty 1

## 2022-12-03 MED ORDER — IPRATROPIUM-ALBUTEROL 0.5-2.5 (3) MG/3ML IN SOLN
3.0000 mL | RESPIRATORY_TRACT | Status: DC | PRN
  Administered 2022-12-03 – 2022-12-04 (×2): 3 mL via RESPIRATORY_TRACT
  Filled 2022-12-03 (×2): qty 3

## 2022-12-03 NOTE — Progress Notes (Signed)
  Echocardiogram 2D Echocardiogram has been performed.  Milda Smart 12/03/2022, 2:53 PM

## 2022-12-03 NOTE — Progress Notes (Signed)
PROGRESS NOTE    Aluna Whiston  WUJ:[id number] DOB: 08-Jul-1956 DOA: 12/02/2022 PCP: Storm Frisk, MD   Brief Narrative:  Jazleen Robeck is a 67 y.o. female with medical history significant of osteoarthritis, asthma, emphysema, chronic respiratory failure with hypoxia And hypercapnia, history of endotracheal intubation followed by tracheostomy that is now reverses, into the emergency department due to anxiety, mostly nonproductive cough with occasional small amount of sputum, dyspnea and wheezing has been progressively worse s over the last 24 hours.  Hospitalist called for admission for presumed COPD exacerbation.   Assessment & Plan:   Principal Problem:   Acute on chronic respiratory failure with hypoxia and hypercapnia Active Problems:   COPD with acute exacerbation   Hypertension   Centrilobular emphysema   GERD (gastroesophageal reflux disease)   Generalized anxiety disorder   Mixed hyperlipidemia   Normocytic anemia   Atypical chest pain   Acute on chronic respiratory failure with hypoxia and hypercapnia secondary to COPD exacerbation with centrilobular emphysema -Patient continues to be hypoxic from baseline initially requiring BiPAP but improving somewhat over the past 24 hours -Continue to wean oxygen aggressively back to baseline -Transition from IV to p.o. methylprednisolone, patient reports an allergy to prednisone in the form of anxiety and palpitations but tolerates methylprednisolone just fine -Continue supportive care as needed and scheduled bronchodilators, inhaled steroids -Clinically improving quite rapidly, presumed discharge in next 24 to 48 hours pending hypoxia resolution   Musculoskeletal chest pain -Secondary to recurrent lingering cough in the setting of above -Pain provoked with deep inspiration or cough -Echocardiogram completed, mild grade 1 dysfunction EF 55 to 60%   Hypertension No longer taking amlodipine -May resume low-dose pending repeat  blood pressures Continue diltiazem, heart rate currently well-controlled   Generalized anxiety disorder Continue BuSpar and hydroxyzine   Mixed hyperlipidemia Continue atorvastatin 10 mg p.o. every evening.   Normocytic anemia Likely chronic anemia of chronic disease given above   GERD (gastroesophageal reflux disease) Continue pantoprazole 40 mg p.o. twice daily.   DVT prophylaxis: Lovenox Code Status: Full Family Communication: None present  Status is: Inpatient  Dispo: The patient is from: Home              Anticipated d/c is to: Home              Anticipated d/c date is: 24 to 48 hours pending clinical course and hypoxia              Patient currently not medically stable for discharge given ongoing hypoxia and profound symptoms  Consultants:  None  Procedures:  None  Antimicrobials:  None  Subjective: No acute issues or events overnight, respiratory status improving but not yet back to baseline  Objective: Vitals:   12/03/22 0600 12/03/22 0754 12/03/22 1259 12/03/22 1513  BP: 124/76  (!) 143/89   Pulse: 87  84   Resp:   18   Temp: (!) 97.4 F (36.3 C)  98.7 F (37.1 C)   TempSrc: Oral  Oral   SpO2: 99% 98% 98% 98%  Weight:      Height:        Intake/Output Summary (Last 24 hours) at 12/03/2022 1550 Last data filed at 12/03/2022 0600 Gross per 24 hour  Intake 417 ml  Output 300 ml  Net 117 ml   Filed Weights   12/02/22 1101 12/02/22 1614  Weight: 96.2 kg 94.6 kg    Examination:  General:  Pleasantly resting in bed, No acute distress. HEENT:  Normocephalic atraumatic.  Sclerae nonicteric, noninjected.  Extraocular movements intact bilaterally. Neck:  Without mass or deformity.  Trachea is midline. Lungs: Scant end expiratory wheeze without overt rhonchi or rales. Heart:  Regular rate and rhythm.  Without murmurs, rubs, or gallops. Abdomen:  Soft, nontender, nondistended.  Without guarding or rebound. Extremities: Without cyanosis, clubbing,  edema, or obvious deformity. Vascular:  Dorsalis pedis and posterior tibial pulses palpable bilaterally. Skin:  Warm and dry, no erythema, no ulcerations.    Data Reviewed: I have personally reviewed following labs and imaging studies  CBC: Recent Labs  Lab 12/02/22 1101 12/03/22 0524  WBC 8.1 5.4  NEUTROABS 3.6  --   HGB 11.3* 9.8*  HCT 39.0 32.8*  MCV 96.5 95.1  PLT 369 297   Basic Metabolic Panel: Recent Labs  Lab 12/02/22 1101 12/02/22 1723 12/03/22 0524  NA 141  --  139  K 4.3  --  4.3  CL 96*  --  99  CO2 32  --  32  GLUCOSE 142*  --  141*  BUN 10  --  16  CREATININE 0.74  --  0.78  CALCIUM 9.0  --  9.0  PHOS  --  2.5  --    GFR: Estimated Creatinine Clearance: 73.6 mL/min (by C-G formula based on SCr of 0.78 mg/dL). Liver Function Tests: Recent Labs  Lab 12/02/22 1101 12/03/22 0524  AST 16 11*  ALT 12 12  ALKPHOS 60 51  BILITOT 0.4 0.4  PROT 7.6 6.4*  ALBUMIN 4.0 3.3*   No results for input(s): "LIPASE", "AMYLASE" in the last 168 hours. No results for input(s): "AMMONIA" in the last 168 hours. Coagulation Profile: No results for input(s): "INR", "PROTIME" in the last 168 hours. Cardiac Enzymes: No results for input(s): "CKTOTAL", "CKMB", "CKMBINDEX", "TROPONINI" in the last 168 hours. BNP (last 3 results) No results for input(s): "PROBNP" in the last 8760 hours. HbA1C: Recent Labs    12/02/22 1723  HGBA1C 5.3   CBG: No results for input(s): "GLUCAP" in the last 168 hours. Lipid Profile: No results for input(s): "CHOL", "HDL", "LDLCALC", "TRIG", "CHOLHDL", "LDLDIRECT" in the last 72 hours. Thyroid Function Tests: No results for input(s): "TSH", "T4TOTAL", "FREET4", "T3FREE", "THYROIDAB" in the last 72 hours. Anemia Panel: No results for input(s): "VITAMINB12", "FOLATE", "FERRITIN", "TIBC", "IRON", "RETICCTPCT" in the last 72 hours. Sepsis Labs: No results for input(s): "PROCALCITON", "LATICACIDVEN" in the last 168 hours.  No results  found for this or any previous visit (from the past 240 hour(s)).       Radiology Studies: ECHOCARDIOGRAM COMPLETE  Result Date: 12/03/2022    ECHOCARDIOGRAM REPORT   Patient Name:   ADAM DEMARY Date of Exam: 12/03/2022 Medical Rec #:  [id number]     Height:       63.0 in Accession #:    [id number]    Weight:       208.6 lb Date of Birth:  [birth year]     BSA:          1.968 m Patient Age:    67 years      BP:           143/89 mmHg Patient Gender: F             HR:           88 bpm. Exam Location:  Inpatient Procedure: 2D Echo, Cardiac Doppler, Color Doppler and Intracardiac            Opacification Agent Indications:  Chest Pain/Abnormal ECG  History:        Patient has prior history of Echocardiogram examinations, most                 recent 10/28/2022. COPD; Risk Factors:Hypertension. Cancer,                 chronic respiratory failure.  Sonographer:    Milda Smart Referring Phys: [id number] DAVID MANUEL ORTIZ  Sonographer Comments: Technically difficult study due to poor echo windows. Image acquisition challenging due to patient body habitus, Image acquisition challenging due to respiratory motion and Image acquisition challenging due to COPD. IMPRESSIONS  1. Left ventricular ejection fraction, by estimation, is 55 to 60%. The left ventricle has normal function. The left ventricle has no regional wall motion abnormalities. Left ventricular diastolic parameters are consistent with Grade I diastolic dysfunction (impaired relaxation).  2. Right ventricular systolic function is normal. The right ventricular size is normal.  3. The mitral valve is normal in structure. Trivial mitral valve regurgitation. No evidence of mitral stenosis.  4. The aortic valve was not well visualized. Aortic valve regurgitation is not visualized. No aortic stenosis is present.  5. The inferior vena cava is normal in size with greater than 50% respiratory variability, suggesting right atrial pressure of 3 mmHg. FINDINGS  Left  Ventricle: Left ventricular ejection fraction, by estimation, is 55 to 60%. The left ventricle has normal function. The left ventricle has no regional wall motion abnormalities. Definity contrast agent was given IV to delineate the left ventricular  endocardial borders. The left ventricular internal cavity size was normal in size. There is no left ventricular hypertrophy. Left ventricular diastolic parameters are consistent with Grade I diastolic dysfunction (impaired relaxation). Right Ventricle: The right ventricular size is normal. No increase in right ventricular wall thickness. Right ventricular systolic function is normal. Left Atrium: Left atrial size was normal in size. Right Atrium: Right atrial size was normal in size. Pericardium: There is no evidence of pericardial effusion. Mitral Valve: The mitral valve is normal in structure. Trivial mitral valve regurgitation. No evidence of mitral valve stenosis. Tricuspid Valve: The tricuspid valve is normal in structure. Tricuspid valve regurgitation is trivial. No evidence of tricuspid stenosis. Aortic Valve: The aortic valve was not well visualized. Aortic valve regurgitation is not visualized. No aortic stenosis is present. Pulmonic Valve: The pulmonic valve was normal in structure. Pulmonic valve regurgitation is not visualized. No evidence of pulmonic stenosis. Aorta: The aortic root is normal in size and structure. Venous: The inferior vena cava is normal in size with greater than 50% respiratory variability, suggesting right atrial pressure of 3 mmHg. IAS/Shunts: No atrial level shunt detected by color flow Doppler.  LEFT VENTRICLE PLAX 2D LVIDd:         3.80 cm     Diastology LVIDs:         3.20 cm     LV e' medial:    6.31 cm/s LV PW:         0.70 cm     LV E/e' medial:  8.1 LV IVS:        0.70 cm     LV e' lateral:   8.05 cm/s LVOT diam:     2.00 cm     LV E/e' lateral: 6.3 LV SV:         63 LV SV Index:   32 LVOT Area:     3.14 cm  LV Volumes (MOD) LV  vol d, MOD  A2C: 74.5 ml LV vol d, MOD A4C: 61.4 ml LV vol s, MOD A2C: 30.6 ml LV vol s, MOD A4C: 26.4 ml LV SV MOD A2C:     43.9 ml LV SV MOD A4C:     61.4 ml LV SV MOD BP:      38.8 ml RIGHT VENTRICLE             IVC RV S prime:     10.90 cm/s  IVC diam: 2.20 cm TAPSE (M-mode): 1.6 cm LEFT ATRIUM             Index        RIGHT ATRIUM           Index LA diam:        2.90 cm 1.47 cm/m   RA Area:     10.50 cm LA Vol (A2C):   27.5 ml 13.97 ml/m  RA Volume:   21.30 ml  10.82 ml/m LA Vol (A4C):   29.0 ml 14.73 ml/m LA Biplane Vol: 30.7 ml 15.60 ml/m  AORTIC VALVE LVOT Vmax:   102.00 cm/s LVOT Vmean:  69.700 cm/s LVOT VTI:    0.199 m  AORTA Ao Root diam: 2.60 cm MITRAL VALVE MV Area (PHT): 2.88 cm    SHUNTS MV Decel Time: 263 msec    Systemic VTI:  0.20 m MV E velocity: 51.00 cm/s  Systemic Diam: 2.00 cm MV A velocity: 80.60 cm/s MV E/A ratio:  0.63 Arvilla Meres MD Electronically signed by Arvilla Meres MD Signature Date/Time: 12/03/2022/3:35:39 PM    Final    DG Chest Portable 1 View  Result Date: 12/02/2022 CLINICAL DATA:  Shortness of breath EXAM: PORTABLE CHEST 1 VIEW COMPARISON:  10/29/2018 FINDINGS: The heart size and mediastinal contours are within normal limits. Mildly increased bibasilar interstitial markings. No appreciable pleural fluid collection. No pneumothorax. The visualized skeletal structures are unremarkable. IMPRESSION: Mildly increased bibasilar interstitial markings, which may reflect atelectasis or atypical/viral infection. Electronically Signed   By: Duanne Guess D.O.   On: 12/02/2022 11:21        Scheduled Meds:  atorvastatin  10 mg Oral QPM   budesonide (PULMICORT) nebulizer solution  0.5 mg Nebulization BID   busPIRone  10 mg Oral BID   cefdinir  300 mg Oral BID   diltiazem  120 mg Oral Daily   enoxaparin (LOVENOX) injection  40 mg Subcutaneous Q24H   ferrous sulfate  325 mg Oral Q breakfast   gabapentin  100 mg Oral TID   loratadine  10 mg Oral Daily    [START ON 12/08/2022] methylPREDNISolone  2 mg Oral Daily   methylPREDNISolone  4 mg Oral Daily   pantoprazole  40 mg Oral BID   revefenacin  175 mcg Nebulization Daily   Continuous Infusions:   LOS: 0 days    Time spent:    Azucena Fallen, DO [facility]  If 7PM-7AM, please contact night-coverage [email or web address]  12/03/2022, 3:50 PM

## 2022-12-03 NOTE — Progress Notes (Signed)
PT Cancellation Note  Patient Details Name: Bianca Murray MRN: [id number] DOB: Sep 18, 1955   Cancelled Treatment:    Reason Eval/Treat Not Completed: Medical issues which prohibited therapy Patient not ready due to SOB. Bianca Murray PT Acute Rehabilitation Services Office [phone] Weekend [phone]   Bianca Murray 12/03/2022, 4:22 PM

## 2022-12-03 NOTE — Progress Notes (Signed)
BiPAP V-60 remains in room for PRN use 12/03/22.

## 2022-12-03 NOTE — Care Management Obs Status (Signed)
MEDICARE OBSERVATION STATUS NOTIFICATION   Patient Details  Name: Bianca Murray MRN: [id number] Date of Birth: June 03, 1956   Medicare Observation Status Notification Given:  Yes    Howell Rucks, RN 12/03/2022, 2:53 PM

## 2022-12-04 ENCOUNTER — Telehealth: Payer: Self-pay

## 2022-12-04 DIAGNOSIS — J9622 Acute and chronic respiratory failure with hypercapnia: Secondary | ICD-10-CM | POA: Diagnosis not present

## 2022-12-04 DIAGNOSIS — J9621 Acute and chronic respiratory failure with hypoxia: Secondary | ICD-10-CM | POA: Diagnosis not present

## 2022-12-04 LAB — BASIC METABOLIC PANEL
Anion gap: 8 (ref 5–15)
BUN: 16 mg/dL (ref 8–23)
CO2: 33 mmol/L — ABNORMAL HIGH (ref 22–32)
Calcium: 8.8 mg/dL — ABNORMAL LOW (ref 8.9–10.3)
Chloride: 98 mmol/L (ref 98–111)
Creatinine, Ser: 0.7 mg/dL (ref 0.44–1.00)
GFR, Estimated: >60 mL/min (ref >60)
Glucose, Bld: 101 mg/dL — ABNORMAL HIGH (ref 70–99)
Potassium: 3.6 mmol/L (ref 3.5–5.1)
Sodium: 139 mmol/L (ref 135–145)

## 2022-12-04 LAB — CBC
HCT: 33.1 % — ABNORMAL LOW (ref 36.0–46.0)
Hemoglobin: 9.9 g/dL — ABNORMAL LOW (ref 12.0–15.0)
MCH: 28.5 pg (ref 26.0–34.0)
MCHC: 29.9 g/dL — ABNORMAL LOW (ref 30.0–36.0)
MCV: 95.4 fL (ref 80.0–100.0)
Platelets: 281 10*3/uL (ref 150–400)
RBC: 3.47 MIL/uL — ABNORMAL LOW (ref 3.87–5.11)
RDW: 15.5 % (ref 11.5–15.5)
WBC: 7.9 10*3/uL (ref 4.0–10.5)
nRBC: 0 % (ref 0.0–0.2)

## 2022-12-04 MED ORDER — METHYLPREDNISOLONE 4 MG PO TBPK: ORAL_TABLET | ORAL | 0 refills | Status: DC

## 2022-12-04 MED ORDER — SALINE SPRAY 0.65 % NA SOLN: 2.0000 | NASAL | 3 refills | Status: AC | PRN

## 2022-12-04 MED ORDER — GUAIFENESIN-DM 100-10 MG/5ML PO SYRP: 15.0000 mL | ORAL_SOLUTION | ORAL | 0 refills | Status: DC | PRN

## 2022-12-04 MED ORDER — FLUTICASONE PROPIONATE 50 MCG/ACT NA SUSP
2.0000 | Freq: Every day | NASAL | 0 refills | Status: AC
Start: 2022-12-04 — End: ?

## 2022-12-04 MED ORDER — LORATADINE 10 MG PO TABS: 10.0000 mg | ORAL_TABLET | Freq: Every day | ORAL | 0 refills | Status: DC

## 2022-12-04 MED ORDER — ALBUTEROL SULFATE (2.5 MG/3ML) 0.083% IN NEBU: INHALATION_SOLUTION | RESPIRATORY_TRACT | 2 refills | Status: DC

## 2022-12-04 MED ORDER — DILTIAZEM HCL ER COATED BEADS 120 MG PO CP24: 120.0000 mg | ORAL_CAPSULE | Freq: Every day | ORAL | 0 refills | Status: AC

## 2022-12-04 MED ORDER — BUSPIRONE HCL 10 MG PO TABS: 10.0000 mg | ORAL_TABLET | Freq: Two times a day (BID) | ORAL | 0 refills | Status: DC

## 2022-12-04 MED ORDER — ALBUTEROL SULFATE HFA 108 (90 BASE) MCG/ACT IN AERS
2.0000 | INHALATION_SPRAY | Freq: Four times a day (QID) | RESPIRATORY_TRACT | 0 refills | Status: AC | PRN
Start: 2022-12-04 — End: ?

## 2022-12-04 NOTE — TOC Initial Note (Signed)
Transition of Care [facility]) - Initial/Assessment Note    Patient Details  Name: Bianca Murray MRN: [id number] Date of Birth: 09/15/1955  Transition of Care [facility]) CM/SW Contact:    Howell Rucks, RN Phone Number: 12/04/2022, 1:03 PM  Clinical Narrative:   Met with pt at bedside to discuss dc plans. Pt confirmed admitted from [facility] short term SNF and plans to return at discharge. TOC will continue to follow                Expected Discharge Plan: Skilled Nursing Facility (curent resident at [facility] Term SNF) Barriers to Discharge: Continued Medical Work up   Patient Goals and CMS Choice Patient states their goals for this hospitalization and ongoing recovery are:: return to short term SNF   Choice offered to / list presented to : Patient      Expected Discharge Plan and Services   Discharge Planning Services: CM Consult Post Acute Care Choice: Skilled Nursing Facility Living arrangements for the past 2 months: Skilled Nursing Facility                                      Prior Living Arrangements/Services Living arrangements for the past 2 months: Skilled Nursing Facility   Patient language and need for interpreter reviewed:: Yes Do you feel safe going back to the place where you live?: Yes      Need for Family Participation in Patient Care: Yes (Comment) Care giver support system in place?: Yes (comment)   Criminal Activity/Legal Involvement Pertinent to Current Situation/Hospitalization: No - Comment as needed  Activities of Daily Living Home Assistive Devices/Equipment: Walker (specify type) ADL Screening (condition at time of admission) Patient's cognitive ability adequate to safely complete daily activities?: Yes Is the patient deaf or have difficulty hearing?: No Does the patient have difficulty seeing, even when wearing glasses/contacts?: No Does the patient have difficulty concentrating, remembering, or making decisions?: No Patient able to  express need for assistance with ADLs?: Yes Does the patient have difficulty dressing or bathing?: Yes Independently performs ADLs?: No Communication: Independent Dressing (OT): Needs assistance Is this a change from baseline?: Pre-admission baseline Grooming: Needs assistance Is this a change from baseline?: Pre-admission baseline Feeding: Needs assistance Is this a change from baseline?: Pre-admission baseline Bathing: Needs assistance Is this a change from baseline?: Pre-admission baseline Toileting: Needs assistance Is this a change from baseline?: Pre-admission baseline In/Out Bed: Needs assistance Is this a change from baseline?: Pre-admission baseline Walks in Home: Needs assistance Is this a change from baseline?: Pre-admission baseline Does the patient have difficulty walking or climbing stairs?: No Weakness of Legs: Both Weakness of Arms/Hands: Both  Permission Sought/Granted Permission sought to share information with : Case Manager Permission granted to share information with : Yes, Verbal Permission Granted  Share Information with NAME: Fannie Knee, RN           Emotional Assessment Appearance:: Appears stated age Attitude/Demeanor/Rapport: Gracious Affect (typically observed): Accepting Orientation: : Oriented to Self, Oriented to Place, Oriented to  Time, Oriented to Situation Alcohol / Substance Use: Not Applicable Psych Involvement: No (comment)  Admission diagnosis:  COPD exacerbation [J44.1] Acute on chronic respiratory failure with hypoxia and hypercapnia [J96.21, J96.22] Acute on chronic respiratory failure with hypoxia [J96.21] Patient Active Problem List   Diagnosis Date Noted   Acute on chronic respiratory failure with hypoxia 12/03/2022   Acute on chronic respiratory failure  with hypoxia and hypercapnia 12/02/2022   Normocytic anemia 12/02/2022   Atypical chest pain 12/02/2022   Vision loss of left eye 12/05/2021   Dependence on supplemental  oxygen 08/13/2021   Mixed hyperlipidemia 06/29/2021   Impaired mobility and personal care 06/28/2021   Generalized anxiety disorder 09/12/2020   GERD (gastroesophageal reflux disease) 03/22/2020   Hypertension 12/03/2018   Centrilobular emphysema 12/03/2018   Vitamin D deficiency 11/27/2018   Chronic respiratory failure with hypoxia and hypercapnia    COPD with acute exacerbation 10/28/2018   COPD GOLD D (chronic obstructive pulmonary disease) (HCC) 10/28/2018   PCP:  Storm Frisk, MD Pharmacy:   [facility] - [place], [place] - [address] [place] [address] Phone: [phone] Fax: [phone]     Social Determinants of Health (SDOH) Social History: SDOH Screenings   Food Insecurity: No Food Insecurity (12/03/2022)  Housing: Low Risk  (12/03/2022)  Recent Concern: Housing - Medium Risk (10/08/2022)  Transportation Needs: No Transportation Needs (12/03/2022)  Utilities: Not At Risk (12/03/2022)  Depression (PHQ2-9): High Risk (06/28/2021)  Tobacco Use: Medium Risk (12/02/2022)   SDOH Interventions:     Readmission Risk Interventions     No data to display

## 2022-12-04 NOTE — Progress Notes (Signed)
Physical Therapy Treatment Patient Details Name: Bianca Murray MRN: [id number] DOB: [birth year] Today's Date: 12/04/2022   History of Present Illness Bianca Murray is a 67 y.o. female with medical history significant of osteoarthritis, asthma, emphysema, chronic respiratory failure with hypoxia And hypercapnia, history of endotracheal intubation followed by tracheostomy that is now reversed, came to ED 12/02/22 due to anxiety, mostly nonproductive cough , dyspnea and wheezing that has  progressived, admission  for presumed COPD exacerbation.    PT Comments    The patient appears anxious and states that she does not feel like herself. She had a goal to ambulate to Br but became very shakey and jittery and unable to progress ambulation.. RN aware of patient current anxiousness.Continue PT  as tolerated.  Recommendations for follow up therapy are one component of a multi-disciplinary discharge planning process, led by the attending physician.  Recommendations may be updated based on patient status, additional functional criteria and insurance authorization.  Follow Up Recommendations  Can patient physically be transported by private vehicle: No    Assistance Recommended at Discharge Frequent or constant Supervision/Assistance  Patient can return home with the following A lot of help with walking and/or transfers;A little help with bathing/dressing/bathroom;Assist for transportation   Equipment Recommendations  None recommended by PT    Recommendations for Other Services       Precautions / Restrictions Precautions Precautions: Fall Precaution Comments: anxious, monitor Sats, on 4 LPM     Mobility  Bed Mobility     General bed mobility comments: in recliner    Transfers Overall transfer level: Needs assistance Equipment used: Rolling walker (2 wheels) Transfers: Sit to/from Stand Sit to Stand: Min assist   Step pivot transfers: Mod assist       General transfer comment:  stood x 1  made attempt to take steps, became jittery and shakey and stated " I can't. I don't know what is wrong ."    Ambulation/Gait                   Stairs             Wheelchair Mobility    Modified Rankin (Stroke Patients Only)       Balance Overall balance assessment: Needs assistance Sitting-balance support: No upper extremity supported, Feet supported Sitting balance-Leahy Scale: Fair     Standing balance support: During functional activity, Bilateral upper extremity supported, Reliant on assistive device for balance Standing balance-Leahy Scale: Poor                              Cognition Arousal/Alertness: Awake/alert Behavior During Therapy: Anxious, Flat affect Overall Cognitive Status: No family/caregiver present to determine baseline cognitive functioning                                 General Comments: patient reports that she does not feel like herself, does not know what is wrong        Exercises      General Comments        Pertinent Vitals/Pain Pain Assessment Pain Assessment: No/denies pain    Home Living Family/patient expects to be discharged to:: Skilled nursing facility                        Prior Function  PT Goals (current goals can now be found in the care plan section) Acute Rehab PT Goals Patient Stated Goal: go back to therapy PT Goal Formulation: With patient Time For Goal Achievement: 12/18/22 Potential to Achieve Goals: Good Progress towards PT goals: Progressing toward goals    Frequency    Min 1X/week      PT Plan Current plan remains appropriate    Co-evaluation              AM-PAC PT "6 Clicks" Mobility   Outcome Measure  Help needed turning from your back to your side while in a flat bed without using bedrails?: A Little Help needed moving from lying on your back to sitting on the side of a flat bed without using bedrails?: A  Little Help needed moving to and from a bed to a chair (including a wheelchair)?: A Lot Help needed standing up from a chair using your arms (e.g., wheelchair or bedside chair)?: A Lot Help needed to walk in hospital room?: Total Help needed climbing 3-5 steps with a railing? : Total 6 Click Score: 12    End of Session Equipment Utilized During Treatment: Gait belt Activity Tolerance: Patient limited by fatigue;Treatment limited secondary to medical complications (Comment) Patient left: in chair;with call bell/phone within reach;with chair alarm set Nurse Communication: Mobility status PT Visit Diagnosis: Unsteadiness on feet (R26.81);Difficulty in walking, not elsewhere classified (R26.2)     Time: 5621-3086 PT Time Calculation (min) (ACUTE ONLY): 17 min  Charges:  $Therapeutic Activity: 8-22 mins                    Blanchard Kelch PT Acute Rehabilitation Services Office [phone] Weekend pager-[phone]   Rada Hay 12/04/2022, 3:13 PM

## 2022-12-04 NOTE — NC FL2 (Signed)
[place] MEDICAID FL2 LEVEL OF CARE FORM     IDENTIFICATION  Patient Name: Bianca Murray Birthdate: [birth year] Sex: female Admission Date (Current Location): 12/02/2022  [facility] [place] Number:  Producer, television/film/video and Address:  [facility],  [address] [place], [place] [address]      Provider Number: [id number]  Attending Physician Name and Address:  Azucena Fallen, MD  Relative Name and Phone Number:  Reymundo Poll phone:[phone]    Current Level of Care: Hospital Recommended Level of Care: Skilled Nursing Facility Prior Approval Number:    Date Approved/Denied:   PASRR Number: [id number]  Discharge Plan: SNF    Current Diagnoses: Patient Active Problem List   Diagnosis Date Noted   Acute on chronic respiratory failure with hypoxia 12/03/2022   Acute on chronic respiratory failure with hypoxia and hypercapnia 12/02/2022   Normocytic anemia 12/02/2022   Atypical chest pain 12/02/2022   Vision loss of left eye 12/05/2021   Dependence on supplemental oxygen 08/13/2021   Mixed hyperlipidemia 06/29/2021   Impaired mobility and personal care 06/28/2021   Generalized anxiety disorder 09/12/2020   GERD (gastroesophageal reflux disease) 03/22/2020   Hypertension 12/03/2018   Centrilobular emphysema 12/03/2018   Vitamin D deficiency 11/27/2018   Chronic respiratory failure with hypoxia and hypercapnia    COPD with acute exacerbation 10/28/2018   COPD GOLD D (chronic obstructive pulmonary disease) (HCC) 10/28/2018    Orientation RESPIRATION BLADDER Height & Weight     Self, Time, Situation, Place  O2 External catheter Weight: 94.6 kg Height:   (160 cm)  BEHAVIORAL SYMPTOMS/MOOD NEUROLOGICAL BOWEL NUTRITION STATUS      Continent Diet (DIET DYS 3)  AMBULATORY STATUS COMMUNICATION OF NEEDS Skin   Extensive Assist Verbally Normal                       Personal Care Assistance Level of Assistance  Bathing, Feeding, Dressing Bathing  Assistance: Limited assistance Feeding assistance: Limited assistance Dressing Assistance: Limited assistance     Functional Limitations Info  Sight, Hearing, Speech Sight Info: Adequate Hearing Info: Adequate Speech Info: Adequate    SPECIAL CARE FACTORS FREQUENCY  PT (By licensed PT), OT (By licensed OT)     PT Frequency: 5x/wk OT Frequency: 5x/wk            Contractures Contractures Info: Present    Additional Factors Info  Code Status, Allergies, Psychotropic Code Status Info: Full Code Allergies Info: Prednisone Psychotropic Info: Buspar  po BID         Current Medications (12/04/2022):  This is the current hospital active medication list Current Facility-Administered Medications  Medication Dose Route Frequency Provider Last Rate Last Admin   acetaminophen (TYLENOL) tablet 650 mg  650 mg Oral Q6H PRN Bobette Mo, MD   650 mg at 12/04/22 8469   Or   acetaminophen (TYLENOL) suppository 650 mg  650 mg Rectal Q6H PRN Bobette Mo, MD       atorvastatin (LIPITOR) tablet 10 mg  10 mg Oral QPM Bobette Mo, MD   10 mg at 12/03/22 1645   benzonatate (TESSALON) capsule 100 mg  100 mg Oral TID PRN Azucena Fallen, MD   100 mg at 12/03/22 1645   budesonide (PULMICORT) nebulizer solution 0.5 mg  0.5 mg Nebulization BID Bobette Mo, MD   0.5 mg at 12/04/22 0748   busPIRone (BUSPAR) tablet 10 mg  10 mg Oral BID Bobette Mo,  MD   10 mg at 12/04/22 1014   diltiazem (CARDIZEM CD) 24 hr capsule 120 mg  120 mg Oral Daily Bobette Mo, MD   120 mg at 12/04/22 1014   enoxaparin (LOVENOX) injection 40 mg  40 mg Subcutaneous Q24H Bobette Mo, MD   40 mg at 12/03/22 2125   ferrous sulfate tablet 325 mg  325 mg Oral Q breakfast Bobette Mo, MD   325 mg at 12/04/22 1014   gabapentin (NEURONTIN) capsule 100 mg  100 mg Oral TID Bobette Mo, MD   100 mg at 12/04/22 1014   guaiFENesin-dextromethorphan (ROBITUSSIN DM)  100-10 MG/5ML syrup 15 mL  15 mL Oral Q4H PRN Bobette Mo, MD   15 mL at 12/04/22 1022   hydrOXYzine (ATARAX) tablet 25 mg  25 mg Oral TID PRN Bobette Mo, MD   25 mg at 12/04/22 1026   ipratropium-albuterol (DUONEB) 0.5-2.5 (3) MG/3ML nebulizer solution 3 mL  3 mL Nebulization Q4H PRN Azucena Fallen, MD   3 mL at 12/04/22 0007   loratadine (CLARITIN) tablet 10 mg  10 mg Oral Daily Bobette Mo, MD   10 mg at 12/04/22 1014   [START ON 12/08/2022] methylPREDNISolone (MEDROL) tablet 2 mg  2 mg Oral Daily Azucena Fallen, MD       methylPREDNISolone (MEDROL) tablet 4 mg  4 mg Oral Daily Azucena Fallen, MD   4 mg at 12/04/22 1014   ondansetron (ZOFRAN) tablet 4 mg  4 mg Oral Q6H PRN Bobette Mo, MD       Or   ondansetron [facility]) injection 4 mg  4 mg Intravenous Q6H PRN Bobette Mo, MD       pantoprazole (PROTONIX) EC tablet 40 mg  40 mg Oral BID Bobette Mo, MD   40 mg at 12/04/22 1014   revefenacin (YUPELRI) nebulizer solution 175 mcg  175 mcg Nebulization Daily Bobette Mo, MD   175 mcg at 12/04/22 1610     Discharge Medications: Please see discharge summary for a list of discharge medications.  Relevant Imaging Results:  Relevant Lab Results:   Additional Information    Howell Rucks, RN

## 2022-12-04 NOTE — Plan of Care (Signed)
  Problem: Education: Goal: Knowledge of General Education information will improve Description: Including pain rating scale, medication(s)/side effects and non-pharmacologic comfort measures Outcome: Completed/Met   Problem: Health Behavior/Discharge Planning: Goal: Ability to manage health-related needs will improve Outcome: Progressing   Problem: Clinical Measurements: Goal: Ability to maintain clinical measurements within normal limits will improve Outcome: Progressing Goal: Will remain free from infection Outcome: Progressing Goal: Diagnostic test results will improve Outcome: Progressing Goal: Respiratory complications will improve Outcome: Progressing Goal: Cardiovascular complication will be avoided Outcome: Progressing   Problem: Activity: Goal: Risk for activity intolerance will decrease Outcome: Adequate for Discharge   Problem: Nutrition: Goal: Adequate nutrition will be maintained Outcome: Adequate for Discharge   Problem: Coping: Goal: Level of anxiety will decrease Outcome: Progressing   Problem: Elimination: Goal: Will not experience complications related to bowel motility Outcome: Completed/Met Goal: Will not experience complications related to urinary retention Outcome: Adequate for Discharge   Problem: Pain Managment: Goal: General experience of comfort will improve Outcome: Progressing   Problem: Safety: Goal: Ability to remain free from injury will improve Outcome: Progressing   Problem: Skin Integrity: Goal: Risk for impaired skin integrity will decrease Outcome: Progressing

## 2022-12-04 NOTE — Progress Notes (Signed)
Pt discharging back to [facility] today per Dr. Natale Milch. Pt's IV sites d/c'd and WDL. Pt's VSS. Report called to Bjorn Loser, receiving RN. Verbalized understanding. Pt currently awaiting PTAR to arrive for transport.

## 2022-12-04 NOTE — Discharge Summary (Signed)
Physician Discharge Summary  Bianca Murray RUE:[id number] DOB: 1956/04/20 DOA: 12/02/2022  PCP: Storm Frisk, MD  Admit date: 12/02/2022 Discharge date: 12/04/2022  Admitted From: SNF Disposition: Home  Recommendations for Outpatient Follow-up:  Follow up with PCP in 1-2 weeks Follow-up with pulmonology as scheduled  Discharge Condition: Stable CODE STATUS: Full Diet recommendation: Low-salt low-fat diet  Brief/Interim Summary: Bianca Murray is a 67 y.o. female with medical history significant of osteoarthritis, asthma, emphysema, chronic respiratory failure with hypoxia and hypercapnia, history of endotracheal intubation followed by tracheostomy that is now reversed, who presents to the emergency department due to anxiety, nonproductive cough and reported worsening dyspnea and wheeze for presumed COPD exacerbation.   Patient was recently [facility] for similar reason of respiratory distress without hypoxia from baseline treated as COPD exacerbation and discharged to rehab facility.  Patient presents with a recurrent episode of respiratory distress and presumed COPD exacerbation although patient has not been acutely hypoxic compared to baseline per documentation.  She did require BiPAP placement at intake due to respiratory distress but does not appear to be hypoxic at the time.  Concerned that majority of patient's symptoms are indeed related to her uncontrolled anxiety.  Patient has been counseled at length, BuSpar had been initiated at intake as well to assist in anxiety management.  She continues on hydroxyzine as well.  Given her age group she is not recommended for benzodiazepines.  At this time patient is ambulating 50 feet with staff without hypoxia and only minimum clinical symptoms of shortness of breath.  She is otherwise stable and agreeable for discharge back to facility -will continue methylprednisolone course and taper, ultimate discharge plan is to  discharge patient back home where she has been somewhat ambulatory dependent on a motorized wheelchair.  Discharge Diagnoses:  Principal Problem:   Acute on chronic respiratory failure with hypoxia and hypercapnia Active Problems:   COPD with acute exacerbation   Hypertension   Centrilobular emphysema   GERD (gastroesophageal reflux disease)   Generalized anxiety disorder   Mixed hyperlipidemia   Normocytic anemia   Atypical chest pain   Acute on chronic respiratory failure with hypoxia   Chronic respiratory failure with hypoxia and hypercapnia  Questionable COPD exacerbation with centrilobular emphysema Uncontrolled anxiety -Patient does not appear to be acutely hypoxic from baseline, she continues on 3 to 4 L nasal cannula without hypoxia.  She was placed on BiPAP initially for respiratory support but unclear if she was truly hypoxic during this episode. -Continue p.o. methylprednisolone, patient reports allergy of "panic attack" when taking prednisone -Continue supportive care as needed and scheduled bronchodilators, inhaled steroids   Musculoskeletal chest pain -Secondary to recurrent lingering cough in the setting of above -Pain provoked with deep inspiration or cough -Echocardiogram completed, mild grade 1 dysfunction EF 55 to 60%   Hypertension Continue diltiazem, heart rate currently well-controlled    Generalized anxiety disorder Continue BuSpar and hydroxyzine   Mixed hyperlipidemia Continue atorvastatin 10 mg p.o. every evening.   Normocytic anemia Likely chronic anemia of chronic disease given above   GERD (gastroesophageal reflux disease) Continue pantoprazole 40 mg p.o. twice daily.  Discharge Instructions   Allergies as of 12/04/2022       Reactions   Prednisone Anxiety, Other (See Comments)   Made the patient feel very unwell        Medication List     STOP taking these medications    amLODipine 5 MG tablet Commonly known as: NORVASC  azelastine 0.1 % nasal spray Commonly known as: ASTELIN   cefdinir 300 MG capsule Commonly known as: OMNICEF   ibuprofen 600 MG tablet Commonly known as: ADVIL   loratadine 10 MG tablet Commonly known as: CLARITIN   losartan 100 MG tablet Commonly known as: COZAAR   polyethylene glycol powder 17 GM/SCOOP powder Commonly known as: GLYCOLAX/MIRALAX       TAKE these medications    acetaminophen 500 MG tablet Commonly known as: TYLENOL Take 1,000 mg by mouth 3 (three) times daily.   Tylenol 325 MG tablet Generic drug: acetaminophen Take 650 mg by mouth every 6 (six) hours as needed (for pain).   albuterol 108 (90 Base) MCG/ACT inhaler Commonly known as: VENTOLIN HFA Inhale 2 puffs into the lungs every 6 (six) hours as needed for wheezing or shortness of breath. What changed: Another medication with the same name was changed. Make sure you understand how and when to take each.   albuterol (2.5 MG/3ML) 0.083% nebulizer solution Commonly known as: PROVENTIL USE ONE VIAL (2.5 MG TOTAL) BY NEBULIZATION 4  TIMES DAILY as needed. What changed:  how much to take how to take this when to take this reasons to take this additional instructions   atorvastatin 10 MG tablet Commonly known as: LIPITOR Take 1 tablet (10 mg total) by mouth daily. What changed: when to take this   benzonatate 100 MG capsule Commonly known as: TESSALON Take 1 capsule (100 mg total) by mouth 2 (two) times daily as needed for cough.   Blood Pressure Monitor Automat Devi Measure blood pressure and pulse daily   budesonide 0.5 MG/2ML nebulizer solution Commonly known as: PULMICORT Take 2 mLs (0.5 mg total) by nebulization 2 (two) times daily.   busPIRone 10 MG tablet Commonly known as: BUSPAR Take 1 tablet (10 mg total) by mouth 2 (two) times daily.   cetirizine 10 MG tablet Commonly known as: ZYRTEC Take 10 mg by mouth in the morning.   cyclobenzaprine 10 MG tablet Commonly known as:  FLEXERIL TAKE 1 TABLET (10 MG TOTAL) BY MOUTH 3 (THREE) TIMES DAILY AS NEEDED FOR MUSCLE SPASMS.   diltiazem 120 MG 24 hr capsule Commonly known as: CARDIZEM CD Take 1 capsule (120 mg total) by mouth daily. Start taking on: December 05, 2022   docusate sodium 100 MG capsule Commonly known as: COLACE Take 100 mg by mouth 2 (two) times daily as needed for mild constipation.   ferrous sulfate 325 (65 FE) MG tablet Take 325 mg by mouth daily with breakfast.   fluticasone 50 MCG/ACT nasal spray Commonly known as: FLONASE Place 2 sprays into both nostrils daily.   formoterol 20 MCG/2ML nebulizer solution Commonly known as: Perforomist Take 2 mLs (20 mcg total) by nebulization 2 (two) times daily.   gabapentin 100 MG capsule Commonly known as: NEURONTIN Take 1 capsule (100 mg total) by mouth 3 (three) times daily.   guaiFENesin-dextromethorphan 100-10 MG/5ML syrup Commonly known as: ROBITUSSIN DM Take 15 mLs by mouth every 4 (four) hours as needed for cough.   hydrOXYzine 25 MG tablet Commonly known as: ATARAX TAKE 1 TABLET (25 MG TOTAL) BY MOUTH 3 (THREE) TIMES DAILY AS NEEDED FOR ANXIETY.   ipratropium-albuterol 0.5-2.5 (3) MG/3ML Soln Commonly known as: DUONEB Take 3 mLs by nebulization 4 (four) times daily.   lidocaine 4 % Place 1 patch onto the skin See admin instructions. Apply 1 patch to the affected area in the morning and remove at night   methylPREDNISolone 4  MG Tbpk tablet Commonly known as: MEDROL DOSEPAK Taper as directed   Misc. Engineer, maintenance, Large shower chair.  Diagnosis- chronic respiratory failure   Pulse Oximeter For Finger Misc 1 application by Does not apply route daily as needed.   Misc. Devices Misc Bedside commode. Diagnosis: COPD   OXYGEN Inhale 4-5 L/min into the lungs continuous.   pantoprazole 40 MG tablet Commonly known as: PROTONIX TAKE 1 TABLET BY MOUTH 2 TIMES DAILY BEFORE A MEAL (AM+EVENING) What changed: See the  new instructions.   sodium chloride 0.65 % Soln nasal spray Commonly known as: OCEAN Place 2 sprays into both nostrils as needed for congestion.   Vitamin D (Ergocalciferol) 1.25 MG (50000 UNIT) Caps capsule Commonly known as: DRISDOL Take 1 capsule (50,000 Units total) by mouth every 7 (seven) days. What changed: when to take this   Yupelri 175 MCG/3ML nebulizer solution Generic drug: revefenacin Take 3 mLs (175 mcg total) by nebulization daily.        Allergies  Allergen Reactions   Prednisone Anxiety and Other (See Comments)    Made the patient feel very unwell    Consultations: None  Procedures/Studies: ECHOCARDIOGRAM COMPLETE  Result Date: 12/03/2022    ECHOCARDIOGRAM REPORT   Patient Name:   JAIDENCE GEISLER Date of Exam: 12/03/2022 Medical Rec #:  [id number]     Height:       63.0 in Accession #:    [id number]    Weight:       208.6 lb Date of Birth:  [birth year]     BSA:          1.968 m Patient Age:    67 years      BP:           143/89 mmHg Patient Gender: F             HR:           88 bpm. Exam Location:  Inpatient Procedure: 2D Echo, Cardiac Doppler, Color Doppler and Intracardiac            Opacification Agent Indications:    Chest Pain/Abnormal ECG  History:        Patient has prior history of Echocardiogram examinations, most                 recent 10/28/2022. COPD; Risk Factors:Hypertension. Cancer,                 chronic respiratory failure.  Sonographer:    Milda Smart Referring Phys: [id number] DAVID MANUEL ORTIZ  Sonographer Comments: Technically difficult study due to poor echo windows. Image acquisition challenging due to patient body habitus, Image acquisition challenging due to respiratory motion and Image acquisition challenging due to COPD. IMPRESSIONS  1. Left ventricular ejection fraction, by estimation, is 55 to 60%. The left ventricle has normal function. The left ventricle has no regional wall motion abnormalities. Left ventricular diastolic parameters are  consistent with Grade I diastolic dysfunction (impaired relaxation).  2. Right ventricular systolic function is normal. The right ventricular size is normal.  3. The mitral valve is normal in structure. Trivial mitral valve regurgitation. No evidence of mitral stenosis.  4. The aortic valve was not well visualized. Aortic valve regurgitation is not visualized. No aortic stenosis is present.  5. The inferior vena cava is normal in size with greater than 50% respiratory variability, suggesting right atrial pressure of 3 mmHg. FINDINGS  Left Ventricle: Left ventricular ejection fraction, by estimation, is 55  to 60%. The left ventricle has normal function. The left ventricle has no regional wall motion abnormalities. Definity contrast agent was given IV to delineate the left ventricular  endocardial borders. The left ventricular internal cavity size was normal in size. There is no left ventricular hypertrophy. Left ventricular diastolic parameters are consistent with Grade I diastolic dysfunction (impaired relaxation). Right Ventricle: The right ventricular size is normal. No increase in right ventricular wall thickness. Right ventricular systolic function is normal. Left [facility]: Left atrial size was normal in size. Right [facility]: Right atrial size was normal in size. Pericardium: There is no evidence of pericardial effusion. Mitral Valve: The mitral valve is normal in structure. Trivial mitral valve regurgitation. No evidence of mitral valve stenosis. Tricuspid Valve: The tricuspid valve is normal in structure. Tricuspid valve regurgitation is trivial. No evidence of tricuspid stenosis. Aortic Valve: The aortic valve was not well visualized. Aortic valve regurgitation is not visualized. No aortic stenosis is present. Pulmonic Valve: The pulmonic valve was normal in structure. Pulmonic valve regurgitation is not visualized. No evidence of pulmonic stenosis. Aorta: The aortic root is normal in size and structure. Venous:  The inferior vena cava is normal in size with greater than 50% respiratory variability, suggesting right atrial pressure of 3 mmHg. IAS/Shunts: No atrial level shunt detected by color flow Doppler.  LEFT VENTRICLE PLAX 2D LVIDd:         3.80 cm     Diastology LVIDs:         3.20 cm     LV e' medial:    6.31 cm/s LV PW:         0.70 cm     LV E/e' medial:  8.1 LV IVS:        0.70 cm     LV e' lateral:   8.05 cm/s LVOT diam:     2.00 cm     LV E/e' lateral: 6.3 LV SV:         63 LV SV Index:   32 LVOT Area:     3.14 cm  LV Volumes (MOD) LV vol d, MOD A2C: 74.5 ml LV vol d, MOD A4C: 61.4 ml LV vol s, MOD A2C: 30.6 ml LV vol s, MOD A4C: 26.4 ml LV SV MOD A2C:     43.9 ml LV SV MOD A4C:     61.4 ml LV SV MOD BP:      38.8 ml RIGHT VENTRICLE             IVC RV S prime:     10.90 cm/s  IVC diam: 2.20 cm TAPSE (M-mode): 1.6 cm LEFT ATRIUM             Index        RIGHT ATRIUM           Index LA diam:        2.90 cm 1.47 cm/m   RA Area:     10.50 cm LA Vol (A2C):   27.5 ml 13.97 ml/m  RA Volume:   21.30 ml  10.82 ml/m LA Vol (A4C):   29.0 ml 14.73 ml/m LA Biplane Vol: 30.7 ml 15.60 ml/m  AORTIC VALVE LVOT Vmax:   102.00 cm/s LVOT Vmean:  69.700 cm/s LVOT VTI:    0.199 m  AORTA Ao Root diam: 2.60 cm MITRAL VALVE MV Area (PHT): 2.88 cm    SHUNTS MV Decel Time: 263 msec    Systemic VTI:  0.20 m MV E velocity:  51.00 cm/s  Systemic Diam: 2.00 cm MV A velocity: 80.60 cm/s MV E/A ratio:  0.63 Arvilla Meres MD Electronically signed by Arvilla Meres MD Signature Date/Time: 12/03/2022/3:35:39 PM    Final    DG Chest Portable 1 View  Result Date: 12/02/2022 CLINICAL DATA:  Shortness of breath EXAM: PORTABLE CHEST 1 VIEW COMPARISON:  10/29/2018 FINDINGS: The heart size and mediastinal contours are within normal limits. Mildly increased bibasilar interstitial markings. No appreciable pleural fluid collection. No pneumothorax. The visualized skeletal structures are unremarkable. IMPRESSION: Mildly increased bibasilar  interstitial markings, which may reflect atelectasis or atypical/viral infection. Electronically Signed   By: Duanne Guess D.O.   On: 12/02/2022 11:21     Subjective: No acute issues or events overnight, initially refusing to work with physical therapy x 2, ultimately was able to ambulate 50 feet without hypoxia from baseline.  Denies nausea vomiting diarrhea constipation fevers chills chest pain.   Discharge Exam: Vitals:   12/04/22 0748 12/04/22 1310  BP:  (!) 147/92  Pulse:  87  Resp:  18  Temp:  98 F (36.7 C)  SpO2: 98% 96%   Vitals:   12/04/22 0007 12/04/22 0527 12/04/22 0748 12/04/22 1310  BP:  (!) 136/93  (!) 147/92  Pulse:  89  87  Resp:  16  18  Temp:  97.6 F (36.4 C)  98 F (36.7 C)  TempSrc:  Oral  Oral  SpO2: 97% 99% 98% 96%  Weight:      Height:        General: Pt is alert, awake, not in acute distress Cardiovascular: RRR, S1/S2 +, no rubs, no gallops Respiratory: CTA bilaterally, no wheezing, no rhonchi Abdominal: Soft, NT, ND, bowel sounds + Extremities: no edema, no cyanosis    The results of significant diagnostics from this hospitalization (including imaging, microbiology, ancillary and laboratory) are listed below for reference.     Microbiology: No results found for this or any previous visit (from the past 240 hour(s)).   Labs: BNP (last 3 results) No results for input(s): "BNP" in the last 8760 hours. Basic Metabolic Panel: Recent Labs  Lab 12/02/22 1101 12/02/22 1723 12/03/22 0524 12/04/22 0607  NA 141  --  139 139  K 4.3  --  4.3 3.6  CL 96*  --  99 98  CO2 32  --  32 33*  GLUCOSE 142*  --  141* 101*  BUN 10  --  16 16  CREATININE 0.74  --  0.78 0.70  CALCIUM 9.0  --  9.0 8.8*  PHOS  --  2.5  --   --    Liver Function Tests: Recent Labs  Lab 12/02/22 1101 12/03/22 0524  AST 16 11*  ALT 12 12  ALKPHOS 60 51  BILITOT 0.4 0.4  PROT 7.6 6.4*  ALBUMIN 4.0 3.3*   No results for input(s): "LIPASE", "AMYLASE" in the  last 168 hours. No results for input(s): "AMMONIA" in the last 168 hours. CBC: Recent Labs  Lab 12/02/22 1101 12/03/22 0524 12/04/22 0607  WBC 8.1 5.4 7.9  NEUTROABS 3.6  --   --   HGB 11.3* 9.8* 9.9*  HCT 39.0 32.8* 33.1*  MCV 96.5 95.1 95.4  PLT 369 297 281   Cardiac Enzymes: No results for input(s): "CKTOTAL", "CKMB", "CKMBINDEX", "TROPONINI" in the last 168 hours. BNP: Invalid input(s): "POCBNP" CBG: No results for input(s): "GLUCAP" in the last 168 hours. D-Dimer No results for input(s): "DDIMER" in the last 72 hours. Hgb  A1c Recent Labs    12/02/22 1723  HGBA1C 5.3   Lipid Profile No results for input(s): "CHOL", "HDL", "LDLCALC", "TRIG", "CHOLHDL", "LDLDIRECT" in the last 72 hours. Thyroid function studies No results for input(s): "TSH", "T4TOTAL", "T3FREE", "THYROIDAB" in the last 72 hours.  Invalid input(s): "FREET3" Anemia work up No results for input(s): "VITAMINB12", "FOLATE", "FERRITIN", "TIBC", "IRON", "RETICCTPCT" in the last 72 hours. Urinalysis No results found for: "COLORURINE", "APPEARANCEUR", "LABSPEC", "PHURINE", "GLUCOSEU", "HGBUR", "BILIRUBINUR", "KETONESUR", "PROTEINUR", "UROBILINOGEN", "NITRITE", "LEUKOCYTESUR" Sepsis Labs Recent Labs  Lab 12/02/22 1101 12/03/22 0524 12/04/22 0607  WBC 8.1 5.4 7.9   Microbiology No results found for this or any previous visit (from the past 240 hour(s)).   Time coordinating discharge: Over 30 minutes  SIGNED:   Azucena Fallen, DO [facility] 12/04/2022, 2:25 PM Pager   If 7PM-7AM, please contact night-coverage [email or web address]

## 2022-12-04 NOTE — Telephone Encounter (Signed)
Copied from CRM [id number]. Topic: General - Inquiry >> Dec 04, 2022 10:09 AM Bianca Murray wrote: Reason for CRM: Patient wanted her provider to know she was admitted into the hospital on Sunday with anxiety/COPD but they are trying to discharge her but she doesn'Murray think she is ready to leave. She is requesting a call back

## 2022-12-04 NOTE — Evaluation (Signed)
Physical Therapy Evaluation Patient Details Name: Bianca Murray MRN: [id number] DOB: 21-Aug-1955 Today's Date: 12/04/2022  History of Present Illness  Bianca Murray is a 67 y.o. female with medical history significant of osteoarthritis, asthma, emphysema, chronic respiratory failure with hypoxia And hypercapnia, history of endotracheal intubation followed by tracheostomy that is now reversed, came to ED 12/02/22 due to anxiety, mostly nonproductive cough , dyspnea and wheezing that has  progressived, admission  for presumed COPD exacerbation.  Clinical Impression  Pt admitted with above diagnosis.  Pt currently with functional limitations due to the deficits listed below (see PT Problem List). Pt will benefit from acute skilled PT to increase their independence and safety with mobility to allow discharge.     The patient reports not feeling well, anxious about getting up to recliner. Patient tolerated  standing and stepping over only, using RW, noted tremors and shaking..  SPO2 93% on 4 LPM.Patient has  been in rehab and will benefit on returning when medically able.     Recommendations for follow up therapy are one component of a multi-disciplinary discharge planning process, led by the attending physician.  Recommendations may be updated based on patient status, additional functional criteria and insurance authorization.  Follow Up Recommendations Can patient physically be transported by private vehicle: No     Assistance Recommended at Discharge Frequent or constant Supervision/Assistance  Patient can return home with the following  A lot of help with walking and/or transfers;A little help with bathing/dressing/bathroom;Assist for transportation    Equipment Recommendations None recommended by PT  Recommendations for Other Services       Functional Status Assessment Patient has had a recent decline in their functional status and demonstrates the ability to make significant improvements in  function in a reasonable and predictable amount of time.     Precautions / Restrictions Precautions Precautions: Fall Precaution Comments: anxious, monitor Sats, on 4 LPM Restrictions Weight Bearing Restrictions: No      Mobility  Bed Mobility Overal bed mobility: Needs Assistance Bed Mobility: Supine to Sit     Supine to sit: Supervision          Transfers Overall transfer level: Needs assistance Equipment used: Rolling walker (2 wheels) Transfers: Sit to/from Stand, Bed to chair/wheelchair/BSC Sit to Stand: Mod assist   Step pivot transfers: Mod assist       General transfer comment: patient shakey and stated" I don't think i can."    Ambulation/Gait                  Stairs            Wheelchair Mobility    Modified Rankin (Stroke Patients Only)       Balance Overall balance assessment: Needs assistance Sitting-balance support: No upper extremity supported, Feet supported Sitting balance-Leahy Scale: Fair     Standing balance support: During functional activity, Bilateral upper extremity supported, Reliant on assistive device for balance Standing balance-Leahy Scale: Poor                               Pertinent Vitals/Pain Pain Assessment Pain Assessment: No/denies pain    Home Living Family/patient expects to be discharged to:: Skilled nursing facility                        Prior Function               Mobility Comments:  states has been able to get to BR independently ADLs Comments: gets assistance form staff     Hand Dominance        Extremity/Trunk Assessment   Upper Extremity Assessment Upper Extremity Assessment: Overall WFL for tasks assessed    Lower Extremity Assessment Lower Extremity Assessment: Generalized weakness    Cervical / Trunk Assessment Cervical / Trunk Assessment: Normal  Communication      Cognition Arousal/Alertness: Awake/alert Behavior During Therapy:  Anxious Overall Cognitive Status: No family/caregiver present to determine baseline cognitive functioning                                 General Comments: follows well, could not recall [facility] name and how long there        General Comments      Exercises     Assessment/Plan    PT Assessment Patient needs continued PT services  PT Problem List Decreased strength;Decreased knowledge of precautions;Decreased cognition;Decreased mobility;Decreased activity tolerance;Decreased safety awareness       PT Treatment Interventions DME instruction;Functional mobility training;Patient/family education;Gait training;Therapeutic activities;Therapeutic exercise;Cognitive remediation    PT Goals (Current goals can be found in the Care Plan section)  Acute Rehab PT Goals Patient Stated Goal: go back to therapy PT Goal Formulation: With patient Time For Goal Achievement: 12/18/22 Potential to Achieve Goals: Good    Frequency Min 1X/week     Co-evaluation               AM-PAC PT "6 Clicks" Mobility  Outcome Measure Help needed turning from your back to your side while in a flat bed without using bedrails?: A Little Help needed moving from lying on your back to sitting on the side of a flat bed without using bedrails?: A Lot Help needed moving to and from a bed to a chair (including a wheelchair)?: A Lot Help needed standing up from a chair using your arms (e.g., wheelchair or bedside chair)?: A Lot Help needed to walk in hospital room?: Total Help needed climbing 3-5 steps with a railing? : Total 6 Click Score: 11    End of Session Equipment Utilized During Treatment: Gait belt Activity Tolerance: Patient limited by fatigue;Treatment limited secondary to medical complications (Comment) Patient left: in chair;with call bell/phone within reach;with chair alarm set Nurse Communication: Mobility status PT Visit Diagnosis: Unsteadiness on feet  (R26.81);Difficulty in walking, not elsewhere classified (R26.2)    Time: 1610-9604 PT Time Calculation (min) (ACUTE ONLY): 17 min   Charges:   PT Evaluation $PT Eval Low Complexity: 1 Low          Blanchard Kelch PT Acute Rehabilitation Services Office [phone] Weekend pager-[phone]   Rada Hay 12/04/2022, 2:09 PM

## 2022-12-04 NOTE — TOC Transition Note (Signed)
Transition of Care [facility]) - CM/SW Discharge Note   Patient Details  Name: Bianca Murray MRN: [id number] Date of Birth: 01-Jun-1956  Transition of Care [facility] CM/SW Contact:  Howell Rucks, RN Phone Number: 12/04/2022, 3:55 PM   Clinical Narrative: DC order to [facility] today for short term SNF. NCM called to [facility], pt admitted  from facilities short term rehab, sw  rep-Janie, paperwork electronically signed by dtr. DC Summary and SNF Tx Report sent via SNF [facility]. RN notified of RM number [id number] and phone to call for report [phone].  [facility] called or transport. No further TOC needs at this time.      Final next level of care: Skilled Nursing Facility Barriers to Discharge: Barriers Resolved   Patient Goals and CMS Choice CMS Medicare.gov Compare Post Acute Care list provided to:: Patient Choice offered to / list presented to : Patient  Discharge Placement                Patient chooses bed at: [facility] Patient to be transferred to facility by: [facility] Name of family member notified: Reymundo Poll (dtr) Patient and family notified of of transfer: 12/04/22  Discharge Plan and Services Additional resources added to the After Visit Summary for     Discharge Planning Services: CM Consult Post Acute Care Choice: Skilled Nursing Facility                               Social Determinants of Health (SDOH) Interventions SDOH Screenings   Food Insecurity: No Food Insecurity (12/03/2022)  Housing: Low Risk  (12/03/2022)  Recent Concern: Housing - Medium Risk (10/08/2022)  Transportation Needs: No Transportation Needs (12/03/2022)  Utilities: Not At Risk (12/03/2022)  Depression (PHQ2-9): High Risk (06/28/2021)  Tobacco Use: Medium Risk (12/02/2022)     Readmission Risk Interventions     No data to display

## 2022-12-04 NOTE — Plan of Care (Signed)
  Problem: Education: Goal: Knowledge of General Education information will improve Description: Including pain rating scale, medication(s)/side effects and non-pharmacologic comfort measures Outcome: Completed/Met   Problem: Health Behavior/Discharge Planning: Goal: Ability to manage health-related needs will improve 12/04/2022 1811 by Loel Ro, RN Outcome: Adequate for Discharge 12/04/2022 1143 by Loel Ro, RN Outcome: Progressing   Problem: Clinical Measurements: Goal: Ability to maintain clinical measurements within normal limits will improve 12/04/2022 1811 by Loel Ro, RN Outcome: Adequate for Discharge 12/04/2022 1143 by Loel Ro, RN Outcome: Progressing Goal: Will remain free from infection 12/04/2022 1811 by Loel Ro, RN Outcome: Adequate for Discharge 12/04/2022 1143 by Loel Ro, RN Outcome: Progressing Goal: Diagnostic test results will improve 12/04/2022 1811 by Loel Ro, RN Outcome: Adequate for Discharge 12/04/2022 1143 by Loel Ro, RN Outcome: Progressing Goal: Respiratory complications will improve 12/04/2022 1811 by Loel Ro, RN Outcome: Adequate for Discharge 12/04/2022 1143 by Loel Ro, RN Outcome: Progressing Goal: Cardiovascular complication will be avoided 12/04/2022 1811 by Loel Ro, RN Outcome: Adequate for Discharge 12/04/2022 1143 by Loel Ro, RN Outcome: Progressing   Problem: Activity: Goal: Risk for activity intolerance will decrease 12/04/2022 1811 by Loel Ro, RN Outcome: Adequate for Discharge 12/04/2022 1143 by Loel Ro, RN Outcome: Adequate for Discharge   Problem: Nutrition: Goal: Adequate nutrition will be maintained 12/04/2022 1811 by Loel Ro, RN Outcome: Adequate for Discharge 12/04/2022 1143 by Loel Ro, RN Outcome: Adequate for Discharge   Problem: Coping: Goal: Level of anxiety will decrease 12/04/2022 1811 by Loel Ro,  RN Outcome: Adequate for Discharge 12/04/2022 1143 by Loel Ro, RN Outcome: Progressing   Problem: Elimination: Goal: Will not experience complications related to bowel motility Outcome: Completed/Met Goal: Will not experience complications related to urinary retention 12/04/2022 1811 by Loel Ro, RN Outcome: Adequate for Discharge 12/04/2022 1143 by Loel Ro, RN Outcome: Adequate for Discharge   Problem: Pain Managment: Goal: General experience of comfort will improve 12/04/2022 1811 by Loel Ro, RN Outcome: Adequate for Discharge 12/04/2022 1143 by Loel Ro, RN Outcome: Progressing   Problem: Safety: Goal: Ability to remain free from injury will improve 12/04/2022 1811 by Loel Ro, RN Outcome: Adequate for Discharge 12/04/2022 1143 by Loel Ro, RN Outcome: Progressing   Problem: Skin Integrity: Goal: Risk for impaired skin integrity will decrease 12/04/2022 1811 by Loel Ro, RN Outcome: Adequate for Discharge 12/04/2022 1143 by Loel Ro, RN Outcome: Progressing

## 2022-12-05 NOTE — Telephone Encounter (Signed)
Fyi to Delford Field  I talked with Erskine Squibb on this matter, I told patient to follow up with the provider at the nursing and I will send message to [facility]

## 2022-12-18 ENCOUNTER — Encounter: Payer: Self-pay | Admitting: Critical Care Medicine

## 2022-12-18 ENCOUNTER — Ambulatory Visit: Payer: Self-pay | Admitting: *Deleted

## 2022-12-18 NOTE — Telephone Encounter (Signed)
Third attempt to reach pt. "Call cannot be completed." Routing to practice for PCPs resolution per protocol.

## 2022-12-18 NOTE — Telephone Encounter (Signed)
Summary: Cough, sore throat, congestion   The patient called stating she has had a cold for a few days. The patient complains of cough, sore throat, and congestion and she has copd. She is currently in a nursing home and she states a lot of people are sick there. Please assist patient further          Attempted to reach pt, "Call cannot be completed as dialed." Redialed x 2, same recording. Unable to leave message to call back.

## 2022-12-18 NOTE — Telephone Encounter (Signed)
Second attempt to reach pt, "Call cannot be completed as dialed," redialed, same recording.

## 2022-12-19 ENCOUNTER — Ambulatory Visit: Payer: 59 | Attending: Critical Care Medicine | Admitting: Critical Care Medicine

## 2022-12-19 ENCOUNTER — Encounter: Payer: Self-pay | Admitting: Critical Care Medicine

## 2022-12-19 DIAGNOSIS — J441 Chronic obstructive pulmonary disease with (acute) exacerbation: Secondary | ICD-10-CM

## 2022-12-19 MED ORDER — SULFAMETHOXAZOLE-TRIMETHOPRIM 800-160 MG PO TABS: 1.0000 | ORAL_TABLET | Freq: Two times a day (BID) | ORAL | 0 refills | Status: AC

## 2022-12-19 MED ORDER — METHYLPREDNISOLONE 4 MG PO TABS: ORAL_TABLET | ORAL | 0 refills | Status: DC

## 2022-12-19 MED ORDER — BENZONATATE 100 MG PO CAPS: 100.0000 mg | ORAL_CAPSULE | Freq: Three times a day (TID) | ORAL | 0 refills | Status: DC | PRN

## 2022-12-19 NOTE — Telephone Encounter (Signed)
Spoke with patient . Verified name & DOB    Patient requested VV with PCP today. PCP agreed patient schedule for SOB, Coughing , and Wheezing.

## 2022-12-19 NOTE — Assessment & Plan Note (Signed)
Recurrent COPD exacerbation  Plan here is to give patient pulsed Medrol dose pack again and as well the patient will receive Bactrim double strength 1 twice daily for acute bronchitic exacerbations.  She will also receive benzonatate as needed for cough.  We will fax the prescriptions over the nursing home and notify staff and the provider there regarding this encounter

## 2022-12-19 NOTE — Progress Notes (Signed)
Established Patient Office Visit  Subjective:  Patient ID: Bianca [facility], female    DOB: 24-Jan-1956  Age: 67 y.o. MRN: [id number] Virtual Visit via video note  I connected with Bianca [facility] on 12/19/22 at 1030am by a video telemedicine application and verified that I am speaking with the correct person using two identifiers.   Consent:  I discussed the limitations, risks, security and privacy concerns of performing an evaluation and management service by video visit and the availability of in person appointments. I also discussed with the patient that there may be a patient responsible charge related to this service. The patient expressed understanding and agreed to proceed.  Location of patient: [facility]'s [facility]  Location of provider: I am in my office  Persons participating in the televisit with the patient.    No one else on the call History of Present Illness:    CC: cough wheezing  HPI 09/28/2021 [facility] presents for primary care follow-up on video.  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  She does tell me she now has her motorized wheelchair and this is doing well inside her home.  Her breathing is at baseline.  She has gotten over the COVID illness she had in the past year.  She needs refills on multiple medications.  There are no primary care gaps active at this time.  She might benefit from a shingles vaccine.  Patient still being visited by the EmT.  11/2021 Patient returns for 22-month follow-up with a video visit.  She had an eye exam recently and she has had decreased vision in the left eye she is concerned about this.  She was given Klonopin by the PA [facility] 2 weeks ago she was only given 14 pills.  She also has hydroxyzine she states this does help.  She had an issue with Klonopin in the past would like to avoid further Klonopin.  Patient did receive  azithromycin with a video visit with another provider for cough the cough now has resolved.  She is on 4 and half liters of oxygen today her saturation is 95%.  Blood pressure today 135/80.  Pulse is 100.  Patient would like a new handicap sticker.  She needs refills on a variety of her medications.  She would like her hernia and the abdomen examined.  There are no other complaints at this time.  05/08/22 Since the last visit the patient is declining quickly.  She is on 4-1/2 to 5 L oxygen.  She needs help with bathing cleaning her room and now needs help with ambulation.  Her daughter is away during the day working.  She is by herself.  She does have a motorized wheelchair.  She also has a rollator.  She would like help with ambulating a bit around the home.  She is able to manage her own medications.  She states that she cannot use the breztri any longer she cannot take a deep enough breath to get the medication in her lungs.  She does have a home nebulizer only has albuterol for this.  She does have a cough productive of white bubbly mucus.  Her saturations were in 86 to 88% on 4-1/2 L.  There is no chest pain.  She currently has 2 hours 5 days a week Monday through Friday a PCS she is requesting now 6 hours a day 5  days a week.  She would be satisfied with 4 hours a day 5 days a week if she cannot get the 6 hours  10/12 Patient seen in return follow-up by telephone contact she has been concerned about blood pressure most the time is 130/80 but recently 153/90 she just finished a course of prednisone this caused lightheadedness she had to stop early.  She is concerned about her breathlessness getting worse over time.  12/21 Patient is seen by way of a phone visit.  She has severe end-stage COPD and her saturations on 4-1/2 L are 95% any less she is in the 85 to 86% range at home.  She needs an increased dose of her oxygen per [facility].  Patient is wishing to get into the Program for increased hours from  [facility] for home care services we recommended assisted living but the patient has declined this at this time.  She is having increased cough productive of thick mucus.  She is wheezing more.  The daughter says she is having difficulty managing her on just 2 hours 5 times a week with a personal care service.  09/20/22 Patient seen in return follow-up on a telephone visit she states she is having more cough more congestion.  She does not oximetry check while on the phone she is 98% on 4 L heart rate 101.  She is inquiring if she can get the Program.  She is having dry skin.  Breathing is continuing to decline.  She is still giving consideration to assisted living  11/21/22 The patient is back in [place] now to a nursing facility with potential transfer to long-term care.  The patient had a bout of respiratory failure while in [facility] visiting her sister.  She was admitted to [facility].  She received [facility] Murray therapy intensive Nabb antibiotics and steroids.  The patient's chest x-ray showed by basilar atelectasis.  Cultures were nonrevealing.  Patient was then transferred to [facility]'s nursing home.  She currently is onOn an iron supplement daily lidocaine patch daily atorvastatin 10 mg daily budesonide 0.5 mg twice daily by nebulizer gabapentin 100 mg 3 times daily she is on a DuoNeb 4 times daily on schedule but also on a long-acting anticholinergic nebulizer called yupelri The patient is on vitamin D weekly.  Patient states she is having increased cough and mucus production.  She is more short of breath and wheezing.  The nursing home physician did not want to manage her nebulized therapy and deferred to me.  12/19/22 Patient is seen by way of a video visit and she is physically in the nursing home in her room.  Nobody else was on this call.  Her daughter did call earlier today and asked for this visit.  She had just gotten out of the [facility] in April documentation is as below.  This was  after our video visit earlier in April. Expand All Collapse All  Physician Discharge Summary  Bianca Murray RUE:[id number] DOB: 04/08/1956 DOA: 12/02/2022   PCP: Storm Frisk, MD   Admit date: 12/02/2022 Discharge date: 12/04/2022   Admitted From: SNF Disposition: Home   Recommendations for Outpatient Follow-up:  Follow up with PCP in 1-2 weeks Follow-up with pulmonology as scheduled   Discharge Condition: Stable CODE STATUS: Full Diet recommendation: Low-salt low-fat diet   Brief/Interim Summary: Janiha Castrillon is a 67 y.o. female with medical history significant of osteoarthritis, asthma, emphysema, chronic respiratory failure with hypoxia and hypercapnia, history of endotracheal intubation followed by tracheostomy that  is now reversed, who presents to the emergency department due to anxiety, nonproductive cough and reported worsening dyspnea and wheeze for presumed COPD exacerbation.    Patient was recently [facility] outside [facility] for similar reason of respiratory distress without hypoxia from baseline treated as COPD exacerbation and discharged to rehab facility.  Patient presents with a recurrent episode of respiratory distress and presumed COPD exacerbation although patient has not been acutely hypoxic compared to baseline per documentation.  She did require [facility] placement at intake due to respiratory distress but does not appear to be hypoxic at the time.  Concerned that majority of patient's symptoms are indeed related to her uncontrolled anxiety.  Patient has been counseled at length, BuSpar had been initiated at intake as well to assist in anxiety management.  She continues on hydroxyzine as well.  Given her age group she is not recommended for benzodiazepines.  At this time patient is ambulating 50 feet with staff without hypoxia and only minimum clinical symptoms of shortness of breath.  She is otherwise stable and agreeable for discharge back to facility -will  continue methylprednisolone course and taper, ultimate discharge plan is to discharge patient back home where she has been somewhat ambulatory dependent on a motorized wheelchair.   Discharge Diagnoses:  Principal Problem:   Acute on chronic respiratory failure with hypoxia and hypercapnia Active Problems:   COPD with acute exacerbation   Hypertension   Centrilobular emphysema   GERD (gastroesophageal reflux disease)   Generalized anxiety disorder   Mixed hyperlipidemia   Normocytic anemia   Atypical chest pain   Acute on chronic respiratory failure with hypoxia     Chronic respiratory failure with hypoxia and hypercapnia  Questionable COPD exacerbation with centrilobular emphysema Uncontrolled anxiety -Patient does not appear to be acutely hypoxic from baseline, she continues on 3 to 4 L nasal cannula without hypoxia.  She was placed on [facility] initially for respiratory support but unclear if she was truly hypoxic during this episode. -Continue p.o. methylprednisolone, patient reports allergy of "panic attack" when taking prednisone -Continue supportive care as needed and scheduled bronchodilators, inhaled steroids   Musculoskeletal chest pain -Secondary to recurrent lingering cough in the setting of above -Pain provoked with deep inspiration or cough -Echocardiogram completed, mild grade 1 dysfunction EF 55 to 60%   Hypertension Continue diltiazem, heart rate currently well-controlled     Generalized anxiety disorder Continue BuSpar and hydroxyzine   Mixed hyperlipidemia Continue atorvastatin 10 mg p.o. every evening.   Normocytic anemia Likely chronic anemia of chronic disease given above   GERD (gastroesophageal reflux disease) Continue pantoprazole 40 mg p.o. twice daily.    Note the patient did not receive any antibiotics she was given pulsed steroid and then sent home on a Medrol Dosepak.  Today she complains of productive progressive cough thick yellow mucus  increase shortness of breath increased wheezing.  She is getting her maintenance nebulizers as we had ordered previously.  There is no hemoptysis. Past Medical History:  Diagnosis Date   Arthritis    Asthma    Cancer (HCC)    Chronic respiratory failure with hypoxia and hypercapnia (HCC)    COPD (chronic obstructive pulmonary disease) (HCC)    Hypertension     Past Surgical History:  Procedure Laterality Date   ABDOMINAL HYSTERECTOMY     TRACHEOSTOMY     reversed   TRACHEOSTOMY CLOSURE      Family History  Problem Relation Age of Onset   Hypertension Mother  Heart disease Mother    Cancer Mother    Asthma Father    Asthma Sister    Hypertension Sister    Asthma Brother    Cancer Brother     Social History   Socioeconomic History   Marital status: Single    Spouse name: Not on file   Number of children: Not on file   Years of education: Not on file   Highest education level: Not on file  Occupational History   Not on file  Tobacco Use   Smoking status: Former   Smokeless tobacco: Never   Tobacco comments:    quit smoking in 2018    Vaping Use   Vaping Use: Never used  Substance and Sexual Activity   Alcohol use: Not Currently   Drug use: Not Currently   Sexual activity: Not on file  Other Topics Concern   Not on file  Social History Narrative   Not on file   Social Determinants of Health   Financial Resource Strain: Not on file  Food Insecurity: No Food Insecurity (12/03/2022)   Hunger Vital Sign    Worried About Running Out of Food in the Last Year: Never true    Ran Out of Food in the Last Year: Never true  Transportation Needs: No Transportation Needs (12/03/2022)   PRAPARE - Administrator, Civil Service (Medical): No    Lack of Transportation (Non-Medical): No  Physical Activity: Not on file  Stress: Not on file  Social Connections: Not on file  Intimate Partner Violence: Not At Risk (12/03/2022)   Humiliation, Afraid, Rape, and  Kick questionnaire    Fear of Current or Ex-Partner: No    Emotionally Abused: No    Physically Abused: No    Sexually Abused: No    Outpatient Medications Prior to Visit  Medication Sig Dispense Refill   acetaminophen (TYLENOL) 500 MG tablet Take 1,000 mg by mouth 3 (three) times daily.     albuterol (PROVENTIL) (2.5 MG/3ML) 0.083% nebulizer solution USE ONE VIAL (2.5 MG TOTAL) BY NEBULIZATION 4  TIMES DAILY as needed. 225 mL 2   albuterol (VENTOLIN HFA) 108 (90 Base) MCG/ACT inhaler Inhale 2 puffs into the lungs every 6 (six) hours as needed for wheezing or shortness of breath. 8.5 g 0   atorvastatin (LIPITOR) 10 MG tablet Take 1 tablet (10 mg total) by mouth daily. (Patient taking differently: Take 10 mg by mouth every evening.) 90 tablet 3   benzonatate (TESSALON) 100 MG capsule Take 1 capsule (100 mg total) by mouth 2 (two) times daily as needed for cough. 20 capsule 0   Blood Pressure Monitoring (BLOOD PRESSURE MONITOR AUTOMAT) DEVI Measure blood pressure and pulse daily 1 Device 0   budesonide (PULMICORT) 0.5 MG/2ML nebulizer solution Take 2 mLs (0.5 mg total) by nebulization 2 (two) times daily. 120 mL 1   busPIRone (BUSPAR) 10 MG tablet Take 1 tablet (10 mg total) by mouth 2 (two) times daily. 60 tablet 0   cetirizine (ZYRTEC) 10 MG tablet Take 10 mg by mouth in the morning.     cyclobenzaprine (FLEXERIL) 10 MG tablet TAKE 1 TABLET (10 MG TOTAL) BY MOUTH 3 (THREE) TIMES DAILY AS NEEDED FOR MUSCLE SPASMS. 60 tablet 0   diltiazem (CARDIZEM CD) 120 MG 24 hr capsule Take 1 capsule (120 mg total) by mouth daily. 30 capsule 0   docusate sodium (COLACE) 100 MG capsule Take 100 mg by mouth 2 (two) times daily as needed  for mild constipation.     ferrous sulfate 325 (65 FE) MG tablet Take 325 mg by mouth daily with breakfast.     fluticasone (FLONASE) 50 MCG/ACT nasal spray Place 2 sprays into both nostrils daily. 11.1 mL 0   formoterol (PERFOROMIST) 20 MCG/2ML nebulizer solution Take 2 mLs  (20 mcg total) by nebulization 2 (two) times daily. 120 mL 6   gabapentin (NEURONTIN) 100 MG capsule Take 1 capsule (100 mg total) by mouth 3 (three) times daily. 90 capsule 3   guaiFENesin-dextromethorphan (ROBITUSSIN DM) 100-10 MG/5ML syrup Take 15 mLs by mouth every 4 (four) hours as needed for cough. 118 mL 0   hydrOXYzine (ATARAX) 25 MG tablet TAKE 1 TABLET (25 MG TOTAL) BY MOUTH 3 (THREE) TIMES DAILY AS NEEDED FOR ANXIETY. 60 tablet 0   ipratropium-albuterol (DUONEB) 0.5-2.5 (3) MG/3ML SOLN Take 3 mLs by nebulization 4 (four) times daily.     lidocaine 4 % Place 1 patch onto the skin See admin instructions. Apply 1 patch to the affected area in the morning and remove at night     methylPREDNISolone (MEDROL DOSEPAK) 4 MG TBPK tablet Taper as directed 1 each 0   Misc. Devices (PULSE OXIMETER FOR FINGER) MISC 1 application by Does not apply route daily as needed. 1 each 0   Misc. Devices MISC Nebulizer machine, Large shower chair.  Diagnosis- chronic respiratory failure 1 each 0   Misc. Devices MISC Bedside commode. Diagnosis: COPD 1 each 0   OXYGEN Inhale 4-5 L/min into the lungs continuous.     pantoprazole (PROTONIX) 40 MG tablet TAKE 1 TABLET BY MOUTH 2 TIMES DAILY BEFORE A MEAL (AM+EVENING) (Patient taking differently: Take 40 mg by mouth 2 (two) times daily before a meal.) 60 tablet 0   sodium chloride (OCEAN) 0.65 % SOLN nasal spray Place 2 sprays into both nostrils as needed for congestion. 60 mL 3   TYLENOL 325 MG tablet Take 650 mg by mouth every 6 (six) hours as needed (for pain).     Vitamin D, Ergocalciferol, (DRISDOL) 1.25 MG (50000 UNIT) CAPS capsule Take 1 capsule (50,000 Units total) by mouth every 7 (seven) days. (Patient taking differently: Take 50,000 Units by mouth every Monday.) 12 capsule 1   YUPELRI 175 MCG/3ML nebulizer solution Take 3 mLs (175 mcg total) by nebulization daily. 90 mL 2   No facility-administered medications prior to visit.    Allergies  Allergen  Reactions   Prednisone Anxiety and Other (See Comments)    Made the patient feel very unwell    ROS Review of Systems  Constitutional: Negative.   HENT:  Negative for ear pain, nosebleeds, postnasal drip, rhinorrhea, sinus pressure, sore throat, trouble swallowing and voice change.   Eyes:  Negative for visual disturbance.  Respiratory:  Positive for cough, chest tightness, shortness of breath and wheezing. Negative for apnea, choking and stridor.        Mucus is bubbles clear with green material  Cardiovascular: Negative.  Negative for chest pain, palpitations and leg swelling.  Gastrointestinal: Negative.  Negative for abdominal distention, abdominal pain, constipation, diarrhea, nausea and vomiting.  Genitourinary: Negative.   Musculoskeletal:  Positive for gait problem. Negative for arthralgias and myalgias.  Skin: Negative.  Negative for rash.  Allergic/Immunologic: Negative.  Negative for environmental allergies and food allergies.  Neurological:  Positive for weakness. Negative for dizziness, syncope and headaches.  Hematological: Negative.  Negative for adenopathy. Does not bruise/bleed easily.  Psychiatric/Behavioral: Negative.  Negative for agitation and  sleep disturbance. The patient is not nervous/anxious.       Objective:    Physical Exam No exam this is a video visit On the video the patient is wearing oxygen and is no acute distress There were no vitals taken for this visit. Wt Readings from Last 3 Encounters:  12/02/22 208 lb 8.9 oz (94.6 kg)  08/24/21 212 lb (96.2 kg)  06/28/21 217 lb (98.4 kg)     Health Maintenance Due  Topic Date Due   Medicare Annual Wellness (AWV)  Never done   Zoster Vaccines- Shingrix (1 of 2) Never done   COVID-19 Vaccine (4 - 2023-24 season) 04/13/2022    There are no preventive care reminders to display for this patient.  No results found for: "TSH" Lab Results  Component Value Date   WBC 7.9 12/04/2022   HGB 9.9 (L)  12/04/2022   HCT 33.1 (L) 12/04/2022   MCV 95.4 12/04/2022   PLT 281 12/04/2022   Lab Results  Component Value Date   NA 139 12/04/2022   K 3.6 12/04/2022   CO2 33 (H) 12/04/2022   GLUCOSE 101 (H) 12/04/2022   BUN 16 12/04/2022   CREATININE 0.70 12/04/2022   BILITOT 0.4 12/03/2022   ALKPHOS 51 12/03/2022   AST 11 (L) 12/03/2022   ALT 12 12/03/2022   PROT 6.4 (L) 12/03/2022   ALBUMIN 3.3 (L) 12/03/2022   CALCIUM 8.8 (L) 12/04/2022   ANIONGAP 8 12/04/2022   EGFR 77 06/28/2021   Lab Results  Component Value Date   CHOL 343 (H) 06/28/2021   Lab Results  Component Value Date   HDL 135 06/28/2021   Lab Results  Component Value Date   LDLCALC 201 (H) 06/28/2021   Lab Results  Component Value Date   TRIG 61 06/28/2021   Lab Results  Component Value Date   CHOLHDL 2.5 06/28/2021   Lab Results  Component Value Date   HGBA1C 5.3 12/02/2022      Assessment & Plan:   Problem List Items Addressed This Visit   None No orders of the defined types were placed in this encounter.   Follow Up Instructions: A prescription for the Perforomist will be sent over to the nursing home and a change in the nebulizer to albuterol alone as needed discussed potential assisted living I discussed the assessment and treatment plan with the patient. The patient was provided an opportunity to ask questions and all were answered. The patient agreed with the plan and demonstrated an understanding of the instructions.   The patient was advised to call back or seek an in-person evaluation if the symptoms worsen or if the condition fails to improve as anticipated.  I provided 30 minutes of non-face-to-face time during this encounter  including  median intraservice time , review of notes, labs, imaging, medications  and explaining diagnosis and management to the patient .    Shan Levans, MD

## 2022-12-20 ENCOUNTER — Telehealth: Payer: Self-pay

## 2022-12-20 NOTE — Telephone Encounter (Signed)
I spoke to Sheila/ [facility]: [phone] regarding orders Dr Delford Field has for the patient.  She said that Nellie, the nurse covering the patient's unit, was not available to speak with me and she requested the orders be faxed to [phone]- Attn: Nellie.  Velna Hatchet said she would be waiting for the fax   Orders then faxed as requested.

## 2023-01-06 NOTE — Progress Notes (Deleted)
Established Patient Office Visit  Subjective:  Patient ID: Bianca Murray, female    DOB: Jun 02, 1956  Age: 67 y.o. MRN: [id number] History of Present Illness:    CC: cough wheezing  HPI 09/28/2021 Bianca Murray presents for primary care follow-up on video.  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  She does tell me she now has her motorized wheelchair and this is doing well inside her home.  Her breathing is at baseline.  She has gotten over the COVID illness she had in the past year.  She needs refills on multiple medications.  There are no primary care gaps active at this time.  She might benefit from a shingles vaccine.  Patient still being visited by the EmT.  11/2021 Patient returns for 76-month follow-up with a video visit.  She had an eye exam recently and she has had decreased vision in the left eye she is concerned about this.  She was given Klonopin by the PA McClung 2 weeks ago she was only given 14 pills.  She also has hydroxyzine she states this does help.  She had an issue with Klonopin in the past would like to avoid further Klonopin.  Patient did receive azithromycin with a video visit with another provider for cough the cough now has resolved.  She is on 4 and half liters of oxygen today her saturation is 95%.  Blood pressure today 135/80.  Pulse is 100.  Patient would like a new handicap sticker.  She needs refills on a variety of her medications.  She would like her hernia and the abdomen examined.  There are no other complaints at this time.  05/08/22 Since the last visit the patient is declining quickly.  She is on 4-1/2 to 5 L oxygen.  She needs help with bathing cleaning her room and now needs help with ambulation.  Her daughter is away during the day working.  She is by herself.  She does have a motorized wheelchair.  She also has a rollator.  She would like help with ambulating a bit  around the home.  She is able to manage her own medications.  She states that she cannot use the breztri any longer she cannot take a deep enough breath to get the medication in her lungs.  She does have a home nebulizer only has albuterol for this.  She does have a cough productive of white bubbly mucus.  Her saturations were in 86 to 88% on 4-1/2 L.  There is no chest pain.  She currently has 2 hours 5 days a week Monday through Friday a PCS she is requesting now 6 hours a day 5 days a week.  She would be satisfied with 4 hours a day 5 days a week if she cannot get the 6 hours  10/12 Patient seen in return follow-up by telephone contact she has been concerned about blood pressure most the time is 130/80 but recently 153/90 she just finished a course of prednisone this caused lightheadedness she had to stop early.  She is concerned about her breathlessness getting worse over time.  12/21 Patient is seen by way of a phone visit.  She has severe end-stage COPD and her saturations on 4-1/2 L are 95% any less she is in the 85 to 86% range at home.  She needs an increased dose of her oxygen per [employer].  Patient is wishing to get into the Program for increased hours from [facility] for home care services we recommended assisted living but the patient has declined this at this time.  She is having increased cough productive of thick mucus.  She is wheezing more.  The daughter says she is having difficulty managing her on just 2 hours 5 times a week with a personal care service.  09/20/22 Patient seen in return follow-up on a telephone visit she states she is having more cough more congestion.  She does not oximetry check while on the phone she is 98% on 4 L heart rate 101.  She is inquiring if she can get the Program.  She is having dry skin.  Breathing is continuing to decline.  She is still giving consideration to assisted living  11/21/22 The patient is back in [place] now to a nursing facility with potential  transfer to long-term care.  The patient had a bout of respiratory failure while in [place] visiting her sister.  She was admitted to [facility].  She received BiPAP therapy intensive Nabb antibiotics and steroids.  The patient's chest x-ray showed by basilar atelectasis.  Cultures were nonrevealing.  Patient was then transferred to [facility].  She currently is onOn an iron supplement daily lidocaine patch daily atorvastatin 10 mg daily budesonide 0.5 mg twice daily by nebulizer gabapentin 100 mg 3 times daily she is on a DuoNeb 4 times daily on schedule but also on a long-acting anticholinergic nebulizer called yupelri The patient is on vitamin D weekly.  Patient states she is having increased cough and mucus production.  She is more short of breath and wheezing.  The nursing home physician did not want to manage her nebulized therapy and deferred to me.  12/19/22 Patient is seen by way of a video visit and she is physically in the nursing home in her room.  Nobody else was on this call.  Her daughter did call earlier today and asked for this visit.  She had just gotten out of the hospital in April documentation is as below.  This was after our video visit earlier in April. Expand All Collapse All  Physician Discharge Summary  Bianca Murray ZOX:[id number] DOB: 27-Mar-1956 DOA: 12/02/2022   PCP: Storm Frisk, MD   Admit date: 12/02/2022 Discharge date: 12/04/2022   Admitted From: SNF Disposition: Home   Recommendations for Outpatient Follow-up:  Follow up with PCP in 1-2 weeks Follow-up with pulmonology as scheduled   Discharge Condition: Stable CODE STATUS: Full Diet recommendation: Low-salt low-fat diet   Brief/Interim Summary: Bianca Murray is a 67 y.o. female with medical history significant of osteoarthritis, asthma, emphysema, chronic respiratory failure with hypoxia and hypercapnia, history of endotracheal intubation followed by tracheostomy that is now  reversed, who presents to the emergency department due to anxiety, nonproductive cough and reported worsening dyspnea and wheeze for presumed COPD exacerbation.    Patient was recently [facility] for similar reason of respiratory distress without hypoxia from baseline treated as COPD exacerbation and discharged to rehab facility.  Patient presents with a recurrent episode of respiratory distress and presumed COPD exacerbation although patient has not been acutely hypoxic compared to baseline per documentation.  She did require BiPAP placement at intake due to respiratory distress but does not appear to be hypoxic at the time.  Concerned that majority of patient's symptoms are indeed related to her uncontrolled anxiety.  Patient has been counseled at length,  BuSpar had been initiated at intake as well to assist in anxiety management.  She continues on hydroxyzine as well.  Given her age group she is not recommended for benzodiazepines.  At this time patient is ambulating 50 feet with staff without hypoxia and only minimum clinical symptoms of shortness of breath.  She is otherwise stable and agreeable for discharge back to facility -will continue methylprednisolone course and taper, ultimate discharge plan is to discharge patient back home where she has been somewhat ambulatory dependent on a motorized wheelchair.   Discharge Diagnoses:  Principal Problem:   Acute on chronic respiratory failure with hypoxia and hypercapnia Active Problems:   COPD with acute exacerbation   Hypertension   Centrilobular emphysema   GERD (gastroesophageal reflux disease)   Generalized anxiety disorder   Mixed hyperlipidemia   Normocytic anemia   Atypical chest pain   Acute on chronic respiratory failure with hypoxia     Chronic respiratory failure with hypoxia and hypercapnia  Questionable COPD exacerbation with centrilobular emphysema Uncontrolled anxiety -Patient does not appear to be  acutely hypoxic from baseline, she continues on 3 to 4 L nasal cannula without hypoxia.  She was placed on BiPAP initially for respiratory support but unclear if she was truly hypoxic during this episode. -Continue p.o. methylprednisolone, patient reports allergy of "panic attack" when taking prednisone -Continue supportive care as needed and scheduled bronchodilators, inhaled steroids   Musculoskeletal chest pain -Secondary to recurrent lingering cough in the setting of above -Pain provoked with deep inspiration or cough -Echocardiogram completed, mild grade 1 dysfunction EF 55 to 60%   Hypertension Continue diltiazem, heart rate currently well-controlled     Generalized anxiety disorder Continue BuSpar and hydroxyzine   Mixed hyperlipidemia Continue atorvastatin 10 mg p.o. every evening.   Normocytic anemia Likely chronic anemia of chronic disease given above   GERD (gastroesophageal reflux disease) Continue pantoprazole 40 mg p.o. twice daily.    Note the patient did not receive any antibiotics she was given pulsed steroid and then sent home on a Medrol Dosepak.  Today she complains of productive progressive cough thick yellow mucus increase shortness of breath increased wheezing.  She is getting her maintenance nebulizers as we had ordered previously.  There is no hemoptysis. Past Medical History:  Diagnosis Date   Arthritis    Asthma    Cancer (HCC)    Chronic respiratory failure with hypoxia and hypercapnia (HCC)    COPD (chronic obstructive pulmonary disease) (HCC)    Hypertension     Past Surgical History:  Procedure Laterality Date   ABDOMINAL HYSTERECTOMY     TRACHEOSTOMY     reversed   TRACHEOSTOMY CLOSURE      Family History  Problem Relation Age of Onset   Hypertension Mother    Heart disease Mother    Cancer Mother    Asthma Father    Asthma Sister    Hypertension Sister    Asthma Brother    Cancer Brother     Social History   Socioeconomic  History   Marital status: Single    Spouse name: Not on file   Number of children: Not on file   Years of education: Not on file   Highest education level: Not on file  Occupational History   Not on file  Tobacco Use   Smoking status: Former   Smokeless tobacco: Never   Tobacco comments:    quit smoking in 2018    Vaping Use   Vaping Use:  Never used  Substance and Sexual Activity   Alcohol use: Not Currently   Drug use: Not Currently   Sexual activity: Not on file  Other Topics Concern   Not on file  Social History Narrative   Not on file   Social Determinants of Health   Financial Resource Strain: Not on file  Food Insecurity: No Food Insecurity (12/03/2022)   Hunger Vital Sign    Worried About Running Out of Food in the Last Year: Never true    Ran Out of Food in the Last Year: Never true  Transportation Needs: No Transportation Needs (12/03/2022)   PRAPARE - Administrator, Civil Service (Medical): No    Lack of Transportation (Non-Medical): No  Physical Activity: Not on file  Stress: Not on file  Social Connections: Not on file  Intimate Partner Violence: Not At Risk (12/03/2022)   Humiliation, Afraid, Rape, and Kick questionnaire    Fear of Current or Ex-Partner: No    Emotionally Abused: No    Physically Abused: No    Sexually Abused: No    Outpatient Medications Prior to Visit  Medication Sig Dispense Refill   acetaminophen (TYLENOL) 500 MG tablet Take 1,000 mg by mouth 3 (three) times daily.     albuterol (PROVENTIL) (2.5 MG/3ML) 0.083% nebulizer solution USE ONE VIAL (2.5 MG TOTAL) BY NEBULIZATION 4  TIMES DAILY as needed. 225 mL 2   albuterol (VENTOLIN HFA) 108 (90 Base) MCG/ACT inhaler Inhale 2 puffs into the lungs every 6 (six) hours as needed for wheezing or shortness of breath. 8.5 g 0   atorvastatin (LIPITOR) 10 MG tablet Take 1 tablet (10 mg total) by mouth daily. (Patient taking differently: Take 10 mg by mouth every evening.) 90 tablet 3    benzonatate (TESSALON) 100 MG capsule Take 1 capsule (100 mg total) by mouth 3 (three) times daily as needed for cough. 60 capsule 0   Blood Pressure Monitoring (BLOOD PRESSURE MONITOR AUTOMAT) DEVI Measure blood pressure and pulse daily 1 Device 0   budesonide (PULMICORT) 0.5 MG/2ML nebulizer solution Take 2 mLs (0.5 mg total) by nebulization 2 (two) times daily. 120 mL 1   busPIRone (BUSPAR) 10 MG tablet Take 1 tablet (10 mg total) by mouth 2 (two) times daily. 60 tablet 0   cetirizine (ZYRTEC) 10 MG tablet Take 10 mg by mouth in the morning.     cyclobenzaprine (FLEXERIL) 10 MG tablet TAKE 1 TABLET (10 MG TOTAL) BY MOUTH 3 (THREE) TIMES DAILY AS NEEDED FOR MUSCLE SPASMS. 60 tablet 0   diltiazem (CARDIZEM CD) 120 MG 24 hr capsule Take 1 capsule (120 mg total) by mouth daily. 30 capsule 0   docusate sodium (COLACE) 100 MG capsule Take 100 mg by mouth 2 (two) times daily as needed for mild constipation.     ferrous sulfate 325 (65 FE) MG tablet Take 325 mg by mouth daily with breakfast.     fluticasone (FLONASE) 50 MCG/ACT nasal spray Place 2 sprays into both nostrils daily. 11.1 mL 0   formoterol (PERFOROMIST) 20 MCG/2ML nebulizer solution Take 2 mLs (20 mcg total) by nebulization 2 (two) times daily. 120 mL 6   gabapentin (NEURONTIN) 100 MG capsule Take 1 capsule (100 mg total) by mouth 3 (three) times daily. 90 capsule 3   guaiFENesin-dextromethorphan (ROBITUSSIN DM) 100-10 MG/5ML syrup Take 15 mLs by mouth every 4 (four) hours as needed for cough. 118 mL 0   hydrOXYzine (ATARAX) 25 MG tablet TAKE 1 TABLET (  25 MG TOTAL) BY MOUTH 3 (THREE) TIMES DAILY AS NEEDED FOR ANXIETY. 60 tablet 0   ipratropium-albuterol (DUONEB) 0.5-2.5 (3) MG/3ML SOLN Take 3 mLs by nebulization 4 (four) times daily.     lidocaine 4 % Place 1 patch onto the skin See admin instructions. Apply 1 patch to the affected area in the morning and remove at night     methylPREDNISolone (MEDROL) 4 MG tablet Take 4 for two days three  for two days two for two days one for two days and stop 20 tablet 0   Misc. Devices (PULSE OXIMETER FOR FINGER) MISC 1 application by Does not apply route daily as needed. 1 each 0   Misc. Devices MISC Nebulizer machine, Large shower chair.  Diagnosis- chronic respiratory failure 1 each 0   Misc. Devices MISC Bedside commode. Diagnosis: COPD 1 each 0   OXYGEN Inhale 4-5 L/min into the lungs continuous.     pantoprazole (PROTONIX) 40 MG tablet TAKE 1 TABLET BY MOUTH 2 TIMES DAILY BEFORE A MEAL (AM+EVENING) (Patient taking differently: Take 40 mg by mouth 2 (two) times daily before a meal.) 60 tablet 0   sertraline (ZOLOFT) 50 MG tablet Take 50 mg by mouth daily.     sodium chloride (OCEAN) 0.65 % SOLN nasal spray Place 2 sprays into both nostrils as needed for congestion. 60 mL 3   TYLENOL 325 MG tablet Take 650 mg by mouth every 6 (six) hours as needed (for pain).     Vitamin D, Ergocalciferol, (DRISDOL) 1.25 MG (50000 UNIT) CAPS capsule Take 1 capsule (50,000 Units total) by mouth every 7 (seven) days. (Patient taking differently: Take 50,000 Units by mouth every Monday.) 12 capsule 1   YUPELRI 175 MCG/3ML nebulizer solution Take 3 mLs (175 mcg total) by nebulization daily. 90 mL 2   No facility-administered medications prior to visit.    Allergies  Allergen Reactions   Prednisone Anxiety and Other (See Comments)    Made the patient feel very unwell    ROS Review of Systems  Constitutional: Negative.   HENT:  Negative for ear pain, nosebleeds, postnasal drip, rhinorrhea, sinus pressure, sore throat, trouble swallowing and voice change.   Eyes:  Negative for visual disturbance.  Respiratory:  Positive for cough, chest tightness, shortness of breath and wheezing. Negative for apnea, choking and stridor.        Mucus is bubbles clear with green material  Cardiovascular: Negative.  Negative for chest pain, palpitations and leg swelling.  Gastrointestinal: Negative.  Negative for abdominal  distention, abdominal pain, constipation, diarrhea, nausea and vomiting.  Genitourinary: Negative.   Musculoskeletal:  Positive for gait problem. Negative for arthralgias and myalgias.  Skin: Negative.  Negative for rash.  Allergic/Immunologic: Negative.  Negative for environmental allergies and food allergies.  Neurological:  Positive for weakness. Negative for dizziness, syncope and headaches.  Hematological: Negative.  Negative for adenopathy. Does not bruise/bleed easily.  Psychiatric/Behavioral: Negative.  Negative for agitation and sleep disturbance. The patient is not nervous/anxious.       Objective:    Physical Exam No exam this is a video visit On the video the patient is wearing oxygen and is no acute distress There were no vitals taken for this visit. Wt Readings from Last 3 Encounters:  12/02/22 208 lb 8.9 oz (94.6 kg)  08/24/21 212 lb (96.2 kg)  06/28/21 217 lb (98.4 kg)     Health Maintenance Due  Topic Date Due   Medicare Annual Wellness (AWV)  Never  done   Zoster Vaccines- Shingrix (1 of 2) Never done   COVID-19 Vaccine (4 - 2023-24 season) 04/13/2022    There are no preventive care reminders to display for this patient.  No results found for: "TSH" Lab Results  Component Value Date   WBC 7.9 12/04/2022   HGB 9.9 (L) 12/04/2022   HCT 33.1 (L) 12/04/2022   MCV 95.4 12/04/2022   PLT 281 12/04/2022   Lab Results  Component Value Date   NA 139 12/04/2022   K 3.6 12/04/2022   CO2 33 (H) 12/04/2022   GLUCOSE 101 (H) 12/04/2022   BUN 16 12/04/2022   CREATININE 0.70 12/04/2022   BILITOT 0.4 12/03/2022   ALKPHOS 51 12/03/2022   AST 11 (L) 12/03/2022   ALT 12 12/03/2022   PROT 6.4 (L) 12/03/2022   ALBUMIN 3.3 (L) 12/03/2022   CALCIUM 8.8 (L) 12/04/2022   ANIONGAP 8 12/04/2022   EGFR 77 06/28/2021   Lab Results  Component Value Date   CHOL 343 (H) 06/28/2021   Lab Results  Component Value Date   HDL 135 06/28/2021   Lab Results  Component  Value Date   LDLCALC 201 (H) 06/28/2021   Lab Results  Component Value Date   TRIG 61 06/28/2021   Lab Results  Component Value Date   CHOLHDL 2.5 06/28/2021   Lab Results  Component Value Date   HGBA1C 5.3 12/02/2022      Assessment & Plan:   Problem List Items Addressed This Visit   None No orders of the defined types were placed in this encounter.     Shan Levans, MD

## 2023-01-08 ENCOUNTER — Encounter: Payer: 59 | Admitting: Critical Care Medicine

## 2023-01-09 ENCOUNTER — Ambulatory Visit: Payer: Self-pay | Admitting: Licensed Clinical Social Worker

## 2023-01-10 NOTE — Patient Outreach (Signed)
  Care Coordination   Follow Up Visit Note   01/09/2023 Name: Bianca Murray MRN: [id number] DOB: March 24, 1956  Bianca Murray is a 67 y.o. year old female who sees Storm Frisk, MD for primary care. I spoke with  Justin Mend daughter, Loma Newton, by phone today.  What matters to the patients health and wellness today?  Symptom Management    Goals Addressed             This Visit's Progress    COMPLETED: Strengthening Support-Obtaining Independent Housing   On track    Activities and task to complete in order to accomplish goals.  Patient's daughter, Loma Newton, agreed to assist with the following goals: Patient will comply with medications prescribed by providers. Daughter provided meds to facility Patient will keep any upcoming appts  LCSW will collaborate with [facility] RNCM for additional patient care needs                    SDOH assessments and interventions completed:  No     Care Coordination Interventions:  Yes, provided  Interventions Today    Flowsheet Row Most Recent Value  Chronic Disease   Chronic disease during today's visit Chronic Obstructive Pulmonary Disease (COPD), Hypertension (HTN)  General Interventions   General Interventions Discussed/Reviewed General Interventions Reviewed  [Pt is doing well, recently transitioned to LTC. Continues to recieve strong support from family]  Mental Health Interventions   Mental Health Discussed/Reviewed Mental Health Reviewed       Follow up plan: No further intervention required.   Encounter Outcome:  Pt. Visit Completed   Jenel Lucks, MSW, LCSW [facility] Care Management [facility]  [facility] [email or web address] Phone [phone] 8:53 AM

## 2023-01-10 NOTE — Patient Instructions (Signed)
Visit Information  Thank you for taking time to visit with me today. Please don't hesitate to contact me if I can be of assistance to you.   Following are the goals we discussed today:   Goals Addressed             This Visit's Progress    COMPLETED: Strengthening Support-Obtaining Independent Housing   On track    Activities and task to complete in order to accomplish goals.  Patient's daughter, Loma Newton, agreed to assist with the following goals: Patient will comply with medications prescribed by providers. Daughter provided meds to facility Patient will keep any upcoming appts  LCSW will collaborate with [facility] RNCM for additional patient care needs                   Please call the care guide team at [phone] if you need to cancel or reschedule your appointment.   If you are experiencing a Mental Health or Behavioral Health Crisis or need someone to talk to, please call the Suicide and Crisis Lifeline: 988 call 911   Patient verbalizes understanding of instructions and care plan provided today and agrees to view in MyChart. Active MyChart status and patient understanding of how to access instructions and care plan via MyChart confirmed with patient.     No further follow up required:    Jenel Lucks, MSW, LCSW [facility] Care Management [facility]  [facility] [email or web address] Phone [phone] 8:54 AM

## 2023-01-17 ENCOUNTER — Telehealth: Payer: Self-pay | Admitting: Critical Care Medicine

## 2023-01-17 NOTE — Telephone Encounter (Signed)
[place] [facility] Request for Prior Approval CMN/PA form received from [facility]. Forms given to PCP for completion.

## 2023-01-17 NOTE — Telephone Encounter (Signed)
[facility] with [facility] is calling regarding incontinence supplies for the pt. Alvy Beal is needing the physician order and medical necessity faxed back to her for the pt.    fax [phone]

## 2023-01-30 ENCOUNTER — Telehealth: Payer: Self-pay

## 2023-01-30 NOTE — Telephone Encounter (Signed)
CMN and physician's order for incontinence supplies received from [facility].  The date on the order is 02/13/2023. I called [facility] : [phone] and spoke to [facility] in documentation and informed her that Dr Delford Field did not order these supplies  and the patient is no longer at home. Deanna Artis said that she would reach out to the patient and her family to inquire where the patient is residing and she would update the medical record

## 2023-02-04 NOTE — Patient Instructions (Incomplete)
Bianca Murray , Thank you for taking time to come for your Medicare Wellness Visit. I appreciate your ongoing commitment to your health goals. Please review the following plan we discussed and let me know if I can assist you in the future.   These are the goals we discussed:  Goals   None     This is a list of the screening recommended for you and due dates:  Health Maintenance  Topic Date Due   Medicare Annual Wellness Visit  Never done   Zoster (Shingles) Vaccine (1 of 2) Never done   COVID-19 Vaccine (4 - 2023-24 season) 04/13/2022   Flu Shot  03/14/2023   DTaP/Tdap/Td vaccine (2 - Td or Tdap) 01/21/2029   Pneumonia Vaccine  Completed   HPV Vaccine  Aged Out   Mammogram  Discontinued   DEXA scan (bone density measurement)  Discontinued   Colon Cancer Screening  Discontinued   Hepatitis C Screening  Discontinued    Advanced directives: Information on Advanced Care Planning can be found at [facility] Advance Health Care Directives Advance Health Care Directives ([email or web address] Please bring a copy of your health care power of attorney and living will to the office to be added to your chart at your convenience.  Conditions/risks identified: Aim for 30 minutes of exercise or brisk walking, 6-8 glasses of water, and 5 servings of fruits and vegetables each day.  Next appointment: Follow up in one year for your annual wellness visit    Preventive Care 65 Years and Older, Female Preventive care refers to lifestyle choices and visits with your health care provider that can promote health and wellness. What does preventive care include? A yearly physical exam. This is also called an annual well check. Dental exams once or twice a year. Routine eye exams. Ask your health care provider how often you should have your eyes checked. Personal lifestyle choices, including: Daily care of your teeth and gums. Regular physical activity. Eating a healthy diet. Avoiding tobacco  and drug use. Limiting alcohol use. Practicing safe sex. Taking low-dose aspirin every day. Taking vitamin and mineral supplements as recommended by your health care provider. What happens during an annual well check? The services and screenings done by your health care provider during your annual well check will depend on your age, overall health, lifestyle risk factors, and family history of disease. Counseling  Your health care provider may ask you questions about your: Alcohol use. Tobacco use. Drug use. Emotional well-being. Home and relationship well-being. Sexual activity. Eating habits. History of falls. Memory and ability to understand (cognition). Work and work Astronomer. Reproductive health. Screening  You may have the following tests or measurements: Height, weight, and BMI. Blood pressure. Lipid and cholesterol levels. These may be checked every 5 years, or more frequently if you are over 49 years old. Skin check. Lung cancer screening. You may have this screening every year starting at age 51 if you have a 30-pack-year history of smoking and currently smoke or have quit within the past 15 years. Fecal occult blood test (FOBT) of the stool. You may have this test every year starting at age 52. Flexible sigmoidoscopy or colonoscopy. You may have a sigmoidoscopy every 5 years or a colonoscopy every 10 years starting at age 8. Hepatitis C blood test. Hepatitis B blood test. Sexually transmitted disease (STD) testing. Diabetes screening. This is done by checking your blood sugar (glucose) after you have not eaten for a while (fasting). You  may have this done every 1-3 years. Bone density scan. This is done to screen for osteoporosis. You may have this done starting at age 84. Mammogram. This may be done every 1-2 years. Talk to your health care provider about how often you should have regular mammograms. Talk with your health care provider about your test results,  treatment options, and if necessary, the need for more tests. Vaccines  Your health care provider may recommend certain vaccines, such as: Influenza vaccine. This is recommended every year. Tetanus, diphtheria, and acellular pertussis (Tdap, Td) vaccine. You may need a Td booster every 10 years. Zoster vaccine. You may need this after age 6. Pneumococcal 13-valent conjugate (PCV13) vaccine. One dose is recommended after age 39. Pneumococcal polysaccharide (PPSV23) vaccine. One dose is recommended after age 76. Talk to your health care provider about which screenings and vaccines you need and how often you need them. This information is not intended to replace advice given to you by your health care provider. Make sure you discuss any questions you have with your health care provider. Document Released: 08/26/2015 Document Revised: 04/18/2016 Document Reviewed: 05/31/2015 Elsevier Interactive Patient Education  2017 [employer].  Fall Prevention in the Home Falls can cause injuries. They can happen to people of all ages. There are many things you can do to make your home safe and to help prevent falls. What can I do on the outside of my home? Regularly fix the edges of walkways and driveways and fix any cracks. Remove anything that might make you trip as you walk through a door, such as a raised step or threshold. Trim any bushes or trees on the path to your home. Use bright outdoor lighting. Clear any walking paths of anything that might make someone trip, such as rocks or tools. Regularly check to see if handrails are loose or broken. Make sure that both sides of any steps have handrails. Any raised decks and porches should have guardrails on the edges. Have any leaves, snow, or ice cleared regularly. Use sand or salt on walking paths during winter. Clean up any spills in your garage right away. This includes oil or grease spills. What can I do in the bathroom? Use night  lights. Install grab bars by the toilet and in the tub and shower. Do not use towel bars as grab bars. Use non-skid mats or decals in the tub or shower. If you need to sit down in the shower, use a plastic, non-slip stool. Keep the floor dry. Clean up any water that spills on the floor as soon as it happens. Remove soap buildup in the tub or shower regularly. Attach bath mats securely with double-sided non-slip rug tape. Do not have throw rugs and other things on the floor that can make you trip. What can I do in the bedroom? Use night lights. Make sure that you have a light by your bed that is easy to reach. Do not use any sheets or blankets that are too big for your bed. They should not hang down onto the floor. Have a firm chair that has side arms. You can use this for support while you get dressed. Do not have throw rugs and other things on the floor that can make you trip. What can I do in the kitchen? Clean up any spills right away. Avoid walking on wet floors. Keep items that you use a lot in easy-to-reach places. If you need to reach something above you, use a strong  step stool that has a grab bar. Keep electrical cords out of the way. Do not use floor polish or wax that makes floors slippery. If you must use wax, use non-skid floor wax. Do not have throw rugs and other things on the floor that can make you trip. What can I do with my stairs? Do not leave any items on the stairs. Make sure that there are handrails on both sides of the stairs and use them. Fix handrails that are broken or loose. Make sure that handrails are as long as the stairways. Check any carpeting to make sure that it is firmly attached to the stairs. Fix any carpet that is loose or worn. Avoid having throw rugs at the top or bottom of the stairs. If you do have throw rugs, attach them to the floor with carpet tape. Make sure that you have a light switch at the top of the stairs and the bottom of the stairs. If  you do not have them, ask someone to add them for you. What else can I do to help prevent falls? Wear shoes that: Do not have high heels. Have rubber bottoms. Are comfortable and fit you well. Are closed at the toe. Do not wear sandals. If you use a stepladder: Make sure that it is fully opened. Do not climb a closed stepladder. Make sure that both sides of the stepladder are locked into place. Ask someone to hold it for you, if possible. Clearly mark and make sure that you can see: Any grab bars or handrails. First and last steps. Where the edge of each step is. Use tools that help you move around (mobility aids) if they are needed. These include: Canes. Walkers. Scooters. Crutches. Turn on the lights when you go into a dark area. Replace any light bulbs as soon as they burn out. Set up your furniture so you have a clear path. Avoid moving your furniture around. If any of your floors are uneven, fix them. If there are any pets around you, be aware of where they are. Review your medicines with your doctor. Some medicines can make you feel dizzy. This can increase your chance of falling. Ask your doctor what other things that you can do to help prevent falls. This information is not intended to replace advice given to you by your health care provider. Make sure you discuss any questions you have with your health care provider. Document Released: 05/26/2009 Document Revised: 01/05/2016 Document Reviewed: 09/03/2014 Elsevier Interactive Patient Education  2017 [employer].

## 2023-02-04 NOTE — Progress Notes (Unsigned)
Subjective:   Bianca Murray is a 67 y.o. female who presents for an Initial Medicare Annual Wellness Visit.  Visit Complete: {VISITMETHOD:[id number]}  Patient Medicare AWV questionnaire was completed by the patient on ***; I have confirmed that all information answered by patient is correct and no changes since this date.  Review of Systems    ***       Objective:    There were no vitals filed for this visit. There is no height or weight on file to calculate BMI.     12/02/2022    5:00 PM 10/28/2018    6:32 AM  Advanced Directives  Does Patient Have a Medical Advance Directive? No No  Would patient like information on creating a medical advance directive? No - Patient declined No - Patient declined    Current Medications (verified) Outpatient Encounter Medications as of 02/05/2023  Medication Sig   acetaminophen (TYLENOL) 500 MG tablet Take 1,000 mg by mouth 3 (three) times daily.   albuterol (PROVENTIL) (2.5 MG/3ML) 0.083% nebulizer solution USE ONE VIAL (2.5 MG TOTAL) BY NEBULIZATION 4  TIMES DAILY as needed.   albuterol (VENTOLIN HFA) 108 (90 Base) MCG/ACT inhaler Inhale 2 puffs into the lungs every 6 (six) hours as needed for wheezing or shortness of breath.   atorvastatin (LIPITOR) 10 MG tablet Take 1 tablet (10 mg total) by mouth daily. (Patient taking differently: Take 10 mg by mouth every evening.)   benzonatate (TESSALON) 100 MG capsule Take 1 capsule (100 mg total) by mouth 3 (three) times daily as needed for cough.   Blood Pressure Monitoring (BLOOD PRESSURE MONITOR AUTOMAT) DEVI Measure blood pressure and pulse daily   budesonide (PULMICORT) 0.5 MG/2ML nebulizer solution Take 2 mLs (0.5 mg total) by nebulization 2 (two) times daily.   busPIRone (BUSPAR) 10 MG tablet Take 1 tablet (10 mg total) by mouth 2 (two) times daily.   cetirizine (ZYRTEC) 10 MG tablet Take 10 mg by mouth in the morning.   cyclobenzaprine (FLEXERIL) 10 MG tablet TAKE 1 TABLET (10 MG TOTAL) BY  MOUTH 3 (THREE) TIMES DAILY AS NEEDED FOR MUSCLE SPASMS.   diltiazem (CARDIZEM CD) 120 MG 24 hr capsule Take 1 capsule (120 mg total) by mouth daily.   docusate sodium (COLACE) 100 MG capsule Take 100 mg by mouth 2 (two) times daily as needed for mild constipation.   ferrous sulfate 325 (65 FE) MG tablet Take 325 mg by mouth daily with breakfast.   fluticasone (FLONASE) 50 MCG/ACT nasal spray Place 2 sprays into both nostrils daily.   formoterol (PERFOROMIST) 20 MCG/2ML nebulizer solution Take 2 mLs (20 mcg total) by nebulization 2 (two) times daily.   gabapentin (NEURONTIN) 100 MG capsule Take 1 capsule (100 mg total) by mouth 3 (three) times daily.   guaiFENesin-dextromethorphan (ROBITUSSIN DM) 100-10 MG/5ML syrup Take 15 mLs by mouth every 4 (four) hours as needed for cough.   hydrOXYzine (ATARAX) 25 MG tablet TAKE 1 TABLET (25 MG TOTAL) BY MOUTH 3 (THREE) TIMES DAILY AS NEEDED FOR ANXIETY.   ipratropium-albuterol (DUONEB) 0.5-2.5 (3) MG/3ML SOLN Take 3 mLs by nebulization 4 (four) times daily.   lidocaine 4 % Place 1 patch onto the skin See admin instructions. Apply 1 patch to the affected area in the morning and remove at night   methylPREDNISolone (MEDROL) 4 MG tablet Take 4 for two days three for two days two for two days one for two days and stop   Misc. Devices (PULSE OXIMETER FOR FINGER) MISC  1 application by Does not apply route daily as needed.   Misc. Devices MISC Nebulizer machine, Large shower chair.  Diagnosis- chronic respiratory failure   Misc. Devices MISC Bedside commode. Diagnosis: COPD   OXYGEN Inhale 4-5 L/min into the lungs continuous.   pantoprazole (PROTONIX) 40 MG tablet TAKE 1 TABLET BY MOUTH 2 TIMES DAILY BEFORE A MEAL (AM+EVENING) (Patient taking differently: Take 40 mg by mouth 2 (two) times daily before a meal.)   sertraline (ZOLOFT) 50 MG tablet Take 50 mg by mouth daily.   sodium chloride (OCEAN) 0.65 % SOLN nasal spray Place 2 sprays into both nostrils as needed  for congestion.   TYLENOL 325 MG tablet Take 650 mg by mouth every 6 (six) hours as needed (for pain).   Vitamin D, Ergocalciferol, (DRISDOL) 1.25 MG (50000 UNIT) CAPS capsule Take 1 capsule (50,000 Units total) by mouth every 7 (seven) days. (Patient taking differently: Take 50,000 Units by mouth every Monday.)   YUPELRI 175 MCG/3ML nebulizer solution Take 3 mLs (175 mcg total) by nebulization daily.   No facility-administered encounter medications on file as of 02/05/2023.    Allergies (verified) Prednisone   History: Past Medical History:  Diagnosis Date   Arthritis    Asthma    Cancer (HCC)    Chronic respiratory failure with hypoxia and hypercapnia (HCC)    COPD (chronic obstructive pulmonary disease) (HCC)    Hypertension    Past Surgical History:  Procedure Laterality Date   ABDOMINAL HYSTERECTOMY     TRACHEOSTOMY     reversed   TRACHEOSTOMY CLOSURE     Family History  Problem Relation Age of Onset   Hypertension Mother    Heart disease Mother    Cancer Mother    Asthma Father    Asthma Sister    Hypertension Sister    Asthma Brother    Cancer Brother    Social History   Socioeconomic History   Marital status: Single    Spouse name: Not on file   Number of children: Not on file   Years of education: Not on file   Highest education level: Not on file  Occupational History   Not on file  Tobacco Use   Smoking status: Former   Smokeless tobacco: Never   Tobacco comments:    quit smoking in 2018    Vaping Use   Vaping Use: Never used  Substance and Sexual Activity   Alcohol use: Not Currently   Drug use: Not Currently   Sexual activity: Not on file  Other Topics Concern   Not on file  Social History Narrative   Not on file   Social Determinants of Health   Financial Resource Strain: Not on file  Food Insecurity: No Food Insecurity (12/03/2022)   Hunger Vital Sign    Worried About Running Out of Food in the Last Year: Never true    Ran Out of  Food in the Last Year: Never true  Transportation Needs: No Transportation Needs (12/03/2022)   PRAPARE - Administrator, Civil Service (Medical): No    Lack of Transportation (Non-Medical): No  Physical Activity: Not on file  Stress: Not on file  Social Connections: Not on file    Tobacco Counseling Counseling given: Not Answered Tobacco comments: quit smoking in 2018     Clinical Intake:                        Activities of  Daily Living    12/02/2022    5:00 PM  In your present state of health, do you have any difficulty performing the following activities:  Hearing? 0  Vision? 0  Difficulty concentrating or making decisions? 0  Walking or climbing stairs? 0  Dressing or bathing? 1  Doing errands, shopping? 1    Patient Care Team: Storm Frisk, MD as PCP - General (Pulmonary Disease)  Indicate any recent Medical Services you may have received from other than [facility] providers in the past year (date may be approximate).     Assessment:   This is a routine wellness examination for [place].  Hearing/Vision screen No results found.  Dietary issues and exercise activities discussed:     Goals Addressed   None    Depression Screen    06/28/2021    9:20 AM 05/17/2021    9:36 AM 11/14/2020    9:23 AM 01/22/2019    1:55 PM 12/03/2018   11:54 AM  PHQ 2/9 Scores  PHQ - 2 Score 4 2 6 2 2   PHQ- 9 Score 12 16 20 4 6   Exception Documentation     Medical reason    Fall Risk    06/28/2021   10:47 AM 11/14/2020    9:23 AM 09/12/2020    8:39 AM 01/22/2019    1:51 PM  Fall Risk   Falls in the past year? 1 0 1 0  Number falls in past yr: 1 0 1   Injury with Fall? 0 0 0   Risk for fall due to :   Impaired balance/gait;Impaired mobility   Follow up   Falls evaluation completed     MEDICARE RISK AT HOME:   TIMED UP AND GO:  Was the test performed? No    Cognitive Function:        Immunizations Immunization History  Administered  Date(s) Administered   Influenza,inj,Quad PF,6+ Mos 06/28/2021   Moderna SARS-COV2 Booster Vaccination 07/26/2020   Moderna Sars-Covid-2 Vaccination 12/31/2019, 01/28/2020   PNEUMOCOCCAL CONJUGATE-20 05/03/2021   Pneumococcal Conjugate-13 11/14/2020   Tdap 01/22/2019    TDAP status: Up to date  Pneumococcal vaccine status: Up to date  Covid-19 vaccine status: Information provided on how to obtain vaccines.   Qualifies for Shingles Vaccine? Yes   Zostavax completed No   Shingrix Completed?: No.    Education has been provided regarding the importance of this vaccine. Patient has been advised to call insurance company to determine out of pocket expense if they have not yet received this vaccine. Advised may also receive vaccine at local pharmacy or Health Dept. Verbalized acceptance and understanding.  Screening Tests Health Maintenance  Topic Date Due   Medicare Annual Wellness (AWV)  Never done   Zoster Vaccines- Shingrix (1 of 2) Never done   COVID-19 Vaccine (4 - 2023-24 season) 04/13/2022   INFLUENZA VACCINE  03/14/2023   DTaP/Tdap/Td (2 - Td or Tdap) 01/21/2029   Pneumonia Vaccine 51+ Years old  Completed   HPV VACCINES  Aged Out   MAMMOGRAM  Discontinued   DEXA SCAN  Discontinued   Colonoscopy  Discontinued   Hepatitis C Screening  Discontinued    Health Maintenance  Health Maintenance Due  Topic Date Due   Medicare Annual Wellness (AWV)  Never done   Zoster Vaccines- Shingrix (1 of 2) Never done   COVID-19 Vaccine (4 - 2023-24 season) 04/13/2022    {Colorectal cancer screening:2101809}  {Mammogram status:21018020}  {Bone Density status:21018021}  Lung Cancer  Screening: (Low Dose CT Chest recommended if Age 51-80 years, 20 pack-year currently smoking OR have quit w/in 15years.) {DOES NOT does:27190::"does not"} qualify.   Lung Cancer Screening Referral: ***  Additional Screening:  Hepatitis C Screening: does qualify; Patient declines  Vision Screening:  Recommended annual ophthalmology exams for early detection of glaucoma and other disorders of the eye. Is the patient up to date with their annual eye exam?  {YES/NO:21197} Who is the provider or what is the name of the office in which the patient attends annual eye exams? *** If pt is not established with a provider, would they like to be referred to a provider to establish care? {YES/NO:21197}.   Dental Screening: Recommended annual dental exams for proper oral hygiene  Community Resource Referral / Chronic Care Management: CRR required this visit?  {YES/NO:21197}  CCM required this visit?  {CCM Required choices:[phone]}     Plan:     I have personally reviewed and noted the following in the patient's chart:   Medical and social history Use of alcohol, tobacco or illicit drugs  Current medications and supplements including opioid prescriptions. {Opioid Prescriptions:[phone]} Functional ability and status Nutritional status Physical activity Advanced directives List of other physicians Hospitalizations, surgeries, and ER visits in previous 12 months Vitals Screenings to include cognitive, depression, and falls Referrals and appointments  In addition, I have reviewed and discussed with patient certain preventive protocols, quality metrics, and best practice recommendations. A written personalized care plan for preventive services as well as general preventive health recommendations were provided to patient.     Kandis Fantasia [place], [place]   04/10/5620   After Visit Summary: {CHL AMB AWV After Visit [phone]}  Nurse Notes: ***

## 2023-02-05 ENCOUNTER — Ambulatory Visit: Payer: 59 | Attending: Critical Care Medicine

## 2023-02-05 ENCOUNTER — Telehealth: Payer: Self-pay

## 2023-02-05 VITALS — Ht 63.0 in | Wt 208.0 lb

## 2023-02-05 DIAGNOSIS — Z0000 Encounter for general adult medical examination without abnormal findings: Secondary | ICD-10-CM

## 2023-02-05 NOTE — Telephone Encounter (Signed)
Patient called and says she had an appointment this morning with Dr. Delford Field, a telephone visit, and she didn't receive a call. I verified the numbers on file, she says her cell number to call is [phone]. She would like to reschedule the AWV. Advised someone will call her back to schedule.

## 2023-02-05 NOTE — Telephone Encounter (Signed)
Spoke with Kandis Fantasia She is going to call patient to reschedule  AWV

## 2023-02-08 ENCOUNTER — Telehealth: Payer: Self-pay | Admitting: Critical Care Medicine

## 2023-02-08 NOTE — Telephone Encounter (Signed)
Copied from CRM [id number]. Topic: General - Inquiry >> Feb 08, 2023 12:34 PM Teressa P wrote: Reason for CRM: Pt called asking if there was something easier she can use for her oxygen.  As far as getting around.  CB# [phone]

## 2023-02-13 NOTE — Telephone Encounter (Signed)
2 things can you confirm she is still in the nursing home and secondly what about a portable oxygen concentrator can we get her assessed for that

## 2023-03-13 ENCOUNTER — Ambulatory Visit: Payer: 59 | Admitting: Critical Care Medicine

## 2023-03-21 ENCOUNTER — Ambulatory Visit: Payer: 59 | Admitting: Critical Care Medicine

## 2023-03-21 ENCOUNTER — Encounter: Payer: Self-pay | Admitting: Critical Care Medicine

## 2023-03-21 ENCOUNTER — Telehealth: Payer: Self-pay

## 2023-03-21 DIAGNOSIS — J441 Chronic obstructive pulmonary disease with (acute) exacerbation: Secondary | ICD-10-CM

## 2023-03-21 DIAGNOSIS — Z87891 Personal history of nicotine dependence: Secondary | ICD-10-CM

## 2023-03-21 DIAGNOSIS — J9611 Chronic respiratory failure with hypoxia: Secondary | ICD-10-CM | POA: Diagnosis not present

## 2023-03-21 DIAGNOSIS — J9612 Chronic respiratory failure with hypercapnia: Secondary | ICD-10-CM | POA: Diagnosis not present

## 2023-03-21 MED ORDER — BENZONATATE 100 MG PO CAPS: 100.0000 mg | ORAL_CAPSULE | Freq: Three times a day (TID) | ORAL | 0 refills | Status: AC | PRN

## 2023-03-21 MED ORDER — METHYLPREDNISOLONE 8 MG PO TABS: ORAL_TABLET | ORAL | 0 refills | Status: DC

## 2023-03-21 MED ORDER — CEFDINIR 300 MG PO CAPS: 300.0000 mg | ORAL_CAPSULE | Freq: Two times a day (BID) | ORAL | 0 refills | Status: AC

## 2023-03-21 NOTE — Assessment & Plan Note (Signed)
Recurrent COPD exacerbation plan will be to give pulse Medrol and cefdinir along with benzonatate and a flutter valve

## 2023-03-21 NOTE — Assessment & Plan Note (Signed)
Continue with oxygen 5 L

## 2023-03-21 NOTE — Telephone Encounter (Signed)
Dr Delford Field had appointment with the patient today and he has new orders for her.  I spoke to [facility] regarding faxing the orders. She requested they be faxed to : [phone] attn: Nurse on 704.  Bianca Murray said she would get them to the nurse. Orders then faxed as requested

## 2023-03-21 NOTE — Progress Notes (Signed)
Established Patient Office Visit  Subjective:  Patient ID: Bianca Murray, female    DOB: [birth year]  Age: 67 y.o. MRN: [id number] Virtual Visit via telephone Note  I connected with Bianca Murray on 03/21/23 at 1030am by a telephone telemedicine application and verified that I am speaking with the correct person using two identifiers.   Consent:  I discussed the limitations, risks, security and privacy concerns of performing an evaluation and management service by video visit and the availability of in person appointments. I also discussed with the patient that there may be a patient responsible charge related to this service. The patient expressed understanding and agreed to proceed.  Location of patient: Patient's [facility]  Location of provider: I am in my office  Persons participating in the televisit with the patient.    No one else on the call History of Present Illness:    CC: cough wheezing  HPI 09/28/2021 Bianca Murray presents for primary care follow-up on video.  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  She does tell me she now has her motorized wheelchair and this is doing well inside her home.  Her breathing is at baseline.  She has gotten over the COVID illness she had in the past year.  She needs refills on multiple medications.  There are no primary care gaps active at this time.  She might benefit from a shingles vaccine.  Patient still being visited by the EmT.  11/2021 Patient returns for 61-month follow-up with a video visit.  She had an eye exam recently and she has had decreased vision in the left eye she is concerned about this.  She was given Klonopin by the PA McClung 2 weeks ago she was only given 14 pills.  She also has hydroxyzine she states this does help.  She had an issue with Klonopin in the past would like to avoid further Klonopin.  Patient did receive  azithromycin with a video visit with another provider for cough the cough now has resolved.  She is on 4 and half liters of oxygen today her saturation is 95%.  Blood pressure today 135/80.  Pulse is 100.  Patient would like a new handicap sticker.  She needs refills on a variety of her medications.  She would like her hernia and the abdomen examined.  There are no other complaints at this time.  05/08/22 Since the last visit the patient is declining quickly.  She is on 4-1/2 to 5 L oxygen.  She needs help with bathing cleaning her room and now needs help with ambulation.  Her daughter is away during the day working.  She is by herself.  She does have a motorized wheelchair.  She also has a rollator.  She would like help with ambulating a bit around the home.  She is able to manage her own medications.  She states that she cannot use the breztri any longer she cannot take a deep enough breath to get the medication in her lungs.  She does have a home nebulizer only has albuterol for this.  She does have a cough productive of white bubbly mucus.  Her saturations were in 86 to 88% on 4-1/2 L.  There is no chest pain.  She currently has 2 hours 5 days a week Monday through Friday a PCS she is requesting now 6 hours a day 5  days a week.  She would be satisfied with 4 hours a day 5 days a week if she cannot get the 6 hours  10/12 Patient seen in return follow-up by telephone contact she has been concerned about blood pressure most the time is 130/80 but recently 153/90 she just finished a course of prednisone this caused lightheadedness she had to stop early.  She is concerned about her breathlessness getting worse over time.  12/21 Patient is seen by way of a phone visit.  She has severe end-stage COPD and her saturations on 4-1/2 L are 95% any less she is in the 85 to 86% range at home.  She needs an increased dose of her oxygen per [employer].  Patient is wishing to get into the Program for increased hours from  [facility] for home care services we recommended assisted living but the patient has declined this at this time.  She is having increased cough productive of thick mucus.  She is wheezing more.  The daughter says she is having difficulty managing her on just 2 hours 5 times a week with a personal care service.  09/20/22 Patient seen in return follow-up on a telephone visit she states she is having more cough more congestion.  She does not oximetry check while on the phone she is 98% on 4 L heart rate 101.  She is inquiring if she can get the Program.  She is having dry skin.  Breathing is continuing to decline.  She is still giving consideration to assisted living  11/21/22 The patient is back in [place] now to a nursing facility with potential transfer to long-term care.  The patient had a bout of respiratory failure while in [place] visiting her sister.  She was admitted to [facility].  She received BiPAP therapy intensive Nabb antibiotics and steroids.  The patient's chest x-ray showed by basilar atelectasis.  Cultures were nonrevealing.  Patient was then transferred to [facility].  She currently is onOn an iron supplement daily lidocaine patch daily atorvastatin 10 mg daily budesonide 0.5 mg twice daily by nebulizer gabapentin 100 mg 3 times daily she is on a DuoNeb 4 times daily on schedule but also on a long-acting anticholinergic nebulizer called yupelri The patient is on vitamin D weekly.  Patient states she is having increased cough and mucus production.  She is more short of breath and wheezing.  The nursing home physician did not want to manage her nebulized therapy and deferred to me.  12/19/22 Patient is seen by way of a video visit and she is physically in the nursing home in her room.  Nobody else was on this call.  Her daughter did call earlier today and asked for this visit.  She had just gotten out of the hospital in April documentation is as below.  This was  after our video visit earlier in April. Expand All Collapse All  Physician Discharge Summary  Bianca Murray ZOX:[id number] DOB: [birth year] DOA: 12/02/2022   PCP: Storm Frisk, MD   Admit date: 12/02/2022 Discharge date: 12/04/2022   Admitted From: SNF Disposition: Home   Recommendations for Outpatient Follow-up:  Follow up with PCP in 1-2 weeks Follow-up with pulmonology as scheduled   Discharge Condition: Stable CODE STATUS: Full Diet recommendation: Low-salt low-fat diet   Brief/Interim Summary: Veva Malek is a 67 y.o. female with medical history significant of osteoarthritis, asthma, emphysema, chronic respiratory failure with hypoxia and hypercapnia, history of endotracheal intubation followed by tracheostomy that  is now reversed, who presents to the emergency department due to anxiety, nonproductive cough and reported worsening dyspnea and wheeze for presumed COPD exacerbation.    Patient was recently [facility] for similar reason of respiratory distress without hypoxia from baseline treated as COPD exacerbation and discharged to rehab facility.  Patient presents with a recurrent episode of respiratory distress and presumed COPD exacerbation although patient has not been acutely hypoxic compared to baseline per documentation.  She did require BiPAP placement at intake due to respiratory distress but does not appear to be hypoxic at the time.  Concerned that majority of patient's symptoms are indeed related to her uncontrolled anxiety.  Patient has been counseled at length, BuSpar had been initiated at intake as well to assist in anxiety management.  She continues on hydroxyzine as well.  Given her age group she is not recommended for benzodiazepines.  At this time patient is ambulating 50 feet with staff without hypoxia and only minimum clinical symptoms of shortness of breath.  She is otherwise stable and agreeable for discharge back to facility -will  continue methylprednisolone course and taper, ultimate discharge plan is to discharge patient back home where she has been somewhat ambulatory dependent on a motorized wheelchair.   Discharge Diagnoses:  Principal Problem:   Acute on chronic respiratory failure with hypoxia and hypercapnia Active Problems:   COPD with acute exacerbation   Hypertension   Centrilobular emphysema   GERD (gastroesophageal reflux disease)   Generalized anxiety disorder   Mixed hyperlipidemia   Normocytic anemia   Atypical chest pain   Acute on chronic respiratory failure with hypoxia     Chronic respiratory failure with hypoxia and hypercapnia  Questionable COPD exacerbation with centrilobular emphysema Uncontrolled anxiety -Patient does not appear to be acutely hypoxic from baseline, she continues on 3 to 4 L nasal cannula without hypoxia.  She was placed on BiPAP initially for respiratory support but unclear if she was truly hypoxic during this episode. -Continue p.o. methylprednisolone, patient reports allergy of "panic attack" when taking prednisone -Continue supportive care as needed and scheduled bronchodilators, inhaled steroids   Musculoskeletal chest pain -Secondary to recurrent lingering cough in the setting of above -Pain provoked with deep inspiration or cough -Echocardiogram completed, mild grade 1 dysfunction EF 55 to 60%   Hypertension Continue diltiazem, heart rate currently well-controlled     Generalized anxiety disorder Continue BuSpar and hydroxyzine   Mixed hyperlipidemia Continue atorvastatin 10 mg p.o. every evening.   Normocytic anemia Likely chronic anemia of chronic disease given above   GERD (gastroesophageal reflux disease) Continue pantoprazole 40 mg p.o. twice daily.    Note the patient did not receive any antibiotics she was given pulsed steroid and then sent home on a Medrol Dosepak.  Today she complains of productive progressive cough thick yellow mucus  increase shortness of breath increased wheezing.  She is getting her maintenance nebulizers as we had ordered previously.  There is no hemoptysis.  8/8 Patient seen today by way of audio telephone visit.  Patient remains in the nursing home.  The patient states she has had increased shortness of breath cough productive of green mucus for the past week.  She is out of her cough medicine has been off all antibiotics and prednisone for a month and a half.  She does have her nebulized therapy and is being given as prescribed.  There are no other complaints.  She states the sertraline helps a little bit with  her anxiety. Past Medical History:  Diagnosis Date   Arthritis    Asthma    Cancer (HCC)    Chronic respiratory failure with hypoxia and hypercapnia (HCC)    COPD (chronic obstructive pulmonary disease) (HCC)    Hypertension     Past Surgical History:  Procedure Laterality Date   ABDOMINAL HYSTERECTOMY     TRACHEOSTOMY     reversed   TRACHEOSTOMY CLOSURE      Family History  Problem Relation Age of Onset   Hypertension Mother    Heart disease Mother    Cancer Mother    Asthma Father    Asthma Sister    Hypertension Sister    Asthma Brother    Cancer Brother     Social History   Socioeconomic History   Marital status: Single    Spouse name: Not on file   Number of children: Not on file   Years of education: Not on file   Highest education level: Not on file  Occupational History   Not on file  Tobacco Use   Smoking status: Former   Smokeless tobacco: Never   Tobacco comments:    quit smoking in 2018    Vaping Use   Vaping status: Never Used  Substance and Sexual Activity   Alcohol use: Not Currently   Drug use: Not Currently   Sexual activity: Not on file  Other Topics Concern   Not on file  Social History Narrative   Not on file   Social Determinants of Health   Financial Resource Strain: Low Risk  (02/05/2023)   Overall Financial Resource Strain  (CARDIA)    Difficulty of Paying Living Expenses: Not hard at all  Food Insecurity: No Food Insecurity (02/05/2023)   Hunger Vital Sign    Worried About Running Out of Food in the Last Year: Never true    Ran Out of Food in the Last Year: Never true  Transportation Needs: No Transportation Needs (02/05/2023)   PRAPARE - Administrator, Civil Service (Medical): No    Lack of Transportation (Non-Medical): No  Physical Activity: Inactive (02/05/2023)   Exercise Vital Sign    Days of Exercise per Week: 0 days    Minutes of Exercise per Session: 0 min  Stress: Stress Concern Present (02/05/2023)   [employer] of Occupational Health - Occupational Stress Questionnaire    Feeling of Stress : To some extent  Social Connections: Socially Isolated (02/05/2023)   Social Connection and Isolation Panel [NHANES]    Frequency of Communication with Friends and Family: Three times a week    Frequency of Social Gatherings with Friends and Family: Once a week    Attends Religious Services: Never    Database administrator or Organizations: No    Attends Banker Meetings: Never    Marital Status: Never married  Intimate Partner Violence: Not At Risk (02/05/2023)   Humiliation, Afraid, Rape, and Kick questionnaire    Fear of Current or Ex-Partner: No    Emotionally Abused: No    Physically Abused: No    Sexually Abused: No    Outpatient Medications Prior to Visit  Medication Sig Dispense Refill   acetaminophen (TYLENOL) 500 MG tablet Take 1,000 mg by mouth 3 (three) times daily.     albuterol (PROVENTIL) (2.5 MG/3ML) 0.083% nebulizer solution USE ONE VIAL (2.5 MG TOTAL) BY NEBULIZATION 4  TIMES DAILY as needed. 225 mL 2   albuterol (VENTOLIN HFA) 108 (90  Base) MCG/ACT inhaler Inhale 2 puffs into the lungs every 6 (six) hours as needed for wheezing or shortness of breath. 8.5 g 0   atorvastatin (LIPITOR) 10 MG tablet Take 1 tablet (10 mg total) by mouth daily. (Patient taking  differently: Take 10 mg by mouth every evening.) 90 tablet 3   Blood Pressure Monitoring (BLOOD PRESSURE MONITOR AUTOMAT) DEVI Measure blood pressure and pulse daily 1 Device 0   busPIRone (BUSPAR) 10 MG tablet Take 1 tablet (10 mg total) by mouth 2 (two) times daily. 60 tablet 0   cetirizine (ZYRTEC) 10 MG tablet Take 10 mg by mouth in the morning.     cyclobenzaprine (FLEXERIL) 10 MG tablet TAKE 1 TABLET (10 MG TOTAL) BY MOUTH 3 (THREE) TIMES DAILY AS NEEDED FOR MUSCLE SPASMS. 60 tablet 0   diltiazem (CARDIZEM CD) 120 MG 24 hr capsule Take 1 capsule (120 mg total) by mouth daily. 30 capsule 0   docusate sodium (COLACE) 100 MG capsule Take 100 mg by mouth 2 (two) times daily as needed for mild constipation.     ferrous sulfate 325 (65 FE) MG tablet Take 325 mg by mouth daily with breakfast.     fluticasone (FLONASE) 50 MCG/ACT nasal spray Place 2 sprays into both nostrils daily. 11.1 mL 0   formoterol (PERFOROMIST) 20 MCG/2ML nebulizer solution Take 2 mLs (20 mcg total) by nebulization 2 (two) times daily. 120 mL 6   gabapentin (NEURONTIN) 100 MG capsule Take 1 capsule (100 mg total) by mouth 3 (three) times daily. 90 capsule 3   guaiFENesin-dextromethorphan (ROBITUSSIN DM) 100-10 MG/5ML syrup Take 15 mLs by mouth every 4 (four) hours as needed for cough. 118 mL 0   hydrOXYzine (ATARAX) 25 MG tablet TAKE 1 TABLET (25 MG TOTAL) BY MOUTH 3 (THREE) TIMES DAILY AS NEEDED FOR ANXIETY. 60 tablet 0   ipratropium-albuterol (DUONEB) 0.5-2.5 (3) MG/3ML SOLN Take 3 mLs by nebulization 4 (four) times daily.     lidocaine 4 % Place 1 patch onto the skin See admin instructions. Apply 1 patch to the affected area in the morning and remove at night     Misc. Devices (PULSE OXIMETER FOR FINGER) MISC 1 application by Does not apply route daily as needed. 1 each 0   Misc. Devices MISC Nebulizer machine, Large shower chair.  Diagnosis- chronic respiratory failure 1 each 0   Misc. Devices MISC Bedside commode.  Diagnosis: COPD 1 each 0   OXYGEN Inhale 4-5 L/min into the lungs continuous.     pantoprazole (PROTONIX) 40 MG tablet TAKE 1 TABLET BY MOUTH 2 TIMES DAILY BEFORE A MEAL (AM+EVENING) (Patient taking differently: Take 40 mg by mouth 2 (two) times daily before a meal.) 60 tablet 0   sertraline (ZOLOFT) 100 MG tablet Take 100 mg by mouth daily.     sodium chloride (OCEAN) 0.65 % SOLN nasal spray Place 2 sprays into both nostrils as needed for congestion. 60 mL 3   TYLENOL 325 MG tablet Take 650 mg by mouth every 6 (six) hours as needed (for pain).     Vitamin D, Ergocalciferol, (DRISDOL) 1.25 MG (50000 UNIT) CAPS capsule Take 1 capsule (50,000 Units total) by mouth every 7 (seven) days. (Patient taking differently: Take 50,000 Units by mouth every Monday.) 12 capsule 1   YUPELRI 175 MCG/3ML nebulizer solution Take 3 mLs (175 mcg total) by nebulization daily. 90 mL 2   benzonatate (TESSALON) 100 MG capsule Take 1 capsule (100 mg total) by mouth 3 (three)  times daily as needed for cough. 60 capsule 0   methylPREDNISolone (MEDROL) 4 MG tablet Take 4 for two days three for two days two for two days one for two days and stop 20 tablet 0   budesonide (PULMICORT) 0.5 MG/2ML nebulizer solution Take 2 mLs (0.5 mg total) by nebulization 2 (two) times daily. 120 mL 1   sertraline (ZOLOFT) 50 MG tablet Take 50 mg by mouth daily.     No facility-administered medications prior to visit.    Allergies  Allergen Reactions   Prednisone Anxiety and Other (See Comments)    Made the patient feel very unwell    ROS Review of Systems  Constitutional: Negative.   HENT:  Negative for ear pain, nosebleeds, postnasal drip, rhinorrhea, sinus pressure, sore throat, trouble swallowing and voice change.   Eyes:  Negative for visual disturbance.  Respiratory:  Positive for cough, chest tightness, shortness of breath and wheezing. Negative for apnea, choking and stridor.        Mucus is bubbles clear with green material   Cardiovascular: Negative.  Negative for chest pain, palpitations and leg swelling.  Gastrointestinal: Negative.  Negative for abdominal distention, abdominal pain, constipation, diarrhea, nausea and vomiting.  Genitourinary: Negative.   Musculoskeletal:  Positive for gait problem. Negative for arthralgias and myalgias.  Skin: Negative.  Negative for rash.  Allergic/Immunologic: Negative.  Negative for environmental allergies and food allergies.  Neurological:  Positive for weakness. Negative for dizziness, syncope and headaches.  Hematological: Negative.  Negative for adenopathy. Does not bruise/bleed easily.  Psychiatric/Behavioral: Negative.  Negative for agitation and sleep disturbance. The patient is not nervous/anxious.       Objective:    Physical Exam No exam this is a video visit On the video the patient is wearing oxygen and is no acute distress There were no vitals taken for this visit. Wt Readings from Last 3 Encounters:  02/05/23 208 lb (94.3 kg)  12/02/22 208 lb 8.9 oz (94.6 kg)  08/24/21 212 lb (96.2 kg)     Health Maintenance Due  Topic Date Due   Zoster Vaccines- Shingrix (1 of 2) Never done   COVID-19 Vaccine (4 - 2023-24 season) 04/13/2022   INFLUENZA VACCINE  03/14/2023    There are no preventive care reminders to display for this patient.  No results found for: "TSH" Lab Results  Component Value Date   WBC 7.9 12/04/2022   HGB 9.9 (L) 12/04/2022   HCT 33.1 (L) 12/04/2022   MCV 95.4 12/04/2022   PLT 281 12/04/2022   Lab Results  Component Value Date   NA 139 12/04/2022   K 3.6 12/04/2022   CO2 33 (H) 12/04/2022   GLUCOSE 101 (H) 12/04/2022   BUN 16 12/04/2022   CREATININE 0.70 12/04/2022   BILITOT 0.4 12/03/2022   ALKPHOS 51 12/03/2022   AST 11 (L) 12/03/2022   ALT 12 12/03/2022   PROT 6.4 (L) 12/03/2022   ALBUMIN 3.3 (L) 12/03/2022   CALCIUM 8.8 (L) 12/04/2022   ANIONGAP 8 12/04/2022   EGFR 77 06/28/2021   Lab Results  Component  Value Date   CHOL 343 (H) 06/28/2021   Lab Results  Component Value Date   HDL 135 06/28/2021   Lab Results  Component Value Date   LDLCALC 201 (H) 06/28/2021   Lab Results  Component Value Date   TRIG 61 06/28/2021   Lab Results  Component Value Date   CHOLHDL 2.5 06/28/2021   Lab Results  Component Value Date  HGBA1C 5.3 12/02/2022      Assessment & Plan:   Problem List Items Addressed This Visit       Respiratory   COPD with acute exacerbation (HCC) - Primary    Recurrent COPD exacerbation plan will be to give pulse Medrol and cefdinir along with benzonatate and a flutter valve      Relevant Medications   benzonatate (TESSALON) 100 MG capsule   methylPREDNISolone (MEDROL) 8 MG tablet   Chronic respiratory failure with hypoxia and hypercapnia (HCC)    Continue with oxygen 5 L      Meds ordered this encounter  Medications   benzonatate (TESSALON) 100 MG capsule    Sig: Take 1 capsule (100 mg total) by mouth 3 (three) times daily as needed for cough.    Dispense:  60 capsule    Refill:  0   methylPREDNISolone (MEDROL) 8 MG tablet    Sig: Take 4 for three days 3 for three days 2 for three days 1 for three days and stop    Dispense:  20 tablet    Refill:  0   cefdinir (OMNICEF) 300 MG capsule    Sig: Take 1 capsule (300 mg total) by mouth 2 (two) times daily for 7 days.    Dispense:  14 capsule    Refill:  0    Follow Up Instructions:  I discussed the assessment and treatment plan with the patient. The patient was provided an opportunity to ask questions and all were answered. The patient agreed with the plan and demonstrated an understanding of the instructions.   The patient was advised to call back or seek an in-person evaluation if the symptoms worsen or if the condition fails to improve as anticipated.  I provided 30 minutes of non-face-to-face time during this encounter  including  median intraservice time , review of notes, labs, imaging,  medications  and explaining diagnosis and management to the patient .    Shan Levans, MD

## 2023-04-18 ENCOUNTER — Emergency Department (HOSPITAL_COMMUNITY): Payer: 59

## 2023-04-18 ENCOUNTER — Encounter (HOSPITAL_COMMUNITY): Payer: Self-pay

## 2023-04-18 ENCOUNTER — Other Ambulatory Visit: Payer: Self-pay

## 2023-04-18 ENCOUNTER — Emergency Department (HOSPITAL_COMMUNITY)
Admission: EM | Admit: 2023-04-18 | Discharge: 2023-04-18 | Disposition: A | Discharge status: Home / Self Care | Payer: 59 | Attending: Emergency Medicine | Admitting: Emergency Medicine

## 2023-04-18 DIAGNOSIS — J45909 Unspecified asthma, uncomplicated: Secondary | ICD-10-CM | POA: Diagnosis not present

## 2023-04-18 DIAGNOSIS — E860 Dehydration: Secondary | ICD-10-CM | POA: Diagnosis not present

## 2023-04-18 DIAGNOSIS — W19XXXA Unspecified fall, initial encounter: Secondary | ICD-10-CM | POA: Insufficient documentation

## 2023-04-18 DIAGNOSIS — R000 Tachycardia, unspecified: Secondary | ICD-10-CM | POA: Insufficient documentation

## 2023-04-18 DIAGNOSIS — Z79899 Other long term (current) drug therapy: Secondary | ICD-10-CM | POA: Insufficient documentation

## 2023-04-18 DIAGNOSIS — J449 Chronic obstructive pulmonary disease, unspecified: Secondary | ICD-10-CM | POA: Diagnosis not present

## 2023-04-18 DIAGNOSIS — Y92129 Unspecified place in nursing home as the place of occurrence of the external cause: Secondary | ICD-10-CM | POA: Insufficient documentation

## 2023-04-18 DIAGNOSIS — S52612A Displaced fracture of left ulna styloid process, initial encounter for closed fracture: Secondary | ICD-10-CM | POA: Insufficient documentation

## 2023-04-18 DIAGNOSIS — S52572A Other intraarticular fracture of lower end of left radius, initial encounter for closed fracture: Secondary | ICD-10-CM | POA: Diagnosis not present

## 2023-04-18 DIAGNOSIS — I10 Essential (primary) hypertension: Secondary | ICD-10-CM | POA: Diagnosis not present

## 2023-04-18 DIAGNOSIS — Z7951 Long term (current) use of inhaled steroids: Secondary | ICD-10-CM | POA: Diagnosis not present

## 2023-04-18 DIAGNOSIS — R42 Dizziness and giddiness: Secondary | ICD-10-CM

## 2023-04-18 DIAGNOSIS — S59912A Unspecified injury of left forearm, initial encounter: Secondary | ICD-10-CM | POA: Diagnosis present

## 2023-04-18 LAB — CBC WITH DIFFERENTIAL/PLATELET
Abs Immature Granulocytes: 0.02 10*3/uL (ref 0.00–0.07)
Basophils Absolute: 0 10*3/uL (ref 0.0–0.1)
Basophils Relative: 0 %
Eosinophils Absolute: 0.2 10*3/uL (ref 0.0–0.5)
Eosinophils Relative: 2 %
HCT: 32 % — ABNORMAL LOW (ref 36.0–46.0)
Hemoglobin: 9.4 g/dL — ABNORMAL LOW (ref 12.0–15.0)
Immature Granulocytes: 0 %
Lymphocytes Relative: 11 %
Lymphs Abs: 0.8 10*3/uL (ref 0.7–4.0)
MCH: 27.7 pg (ref 26.0–34.0)
MCHC: 29.4 g/dL — ABNORMAL LOW (ref 30.0–36.0)
MCV: 94.4 fL (ref 80.0–100.0)
Monocytes Absolute: 0.5 10*3/uL (ref 0.1–1.0)
Monocytes Relative: 7 %
Neutro Abs: 5.8 10*3/uL (ref 1.7–7.7)
Neutrophils Relative %: 80 %
Platelets: 172 10*3/uL (ref 150–400)
RBC: 3.39 MIL/uL — ABNORMAL LOW (ref 3.87–5.11)
RDW: 13.7 % (ref 11.5–15.5)
WBC: 7.3 10*3/uL (ref 4.0–10.5)
nRBC: 0 % (ref 0.0–0.2)

## 2023-04-18 LAB — BASIC METABOLIC PANEL
Anion gap: 10 (ref 5–15)
BUN: 12 mg/dL (ref 8–23)
CO2: 31 mmol/L (ref 22–32)
Calcium: 8.6 mg/dL — ABNORMAL LOW (ref 8.9–10.3)
Chloride: 99 mmol/L (ref 98–111)
Creatinine, Ser: 0.75 mg/dL (ref 0.44–1.00)
GFR, Estimated: >60 mL/min (ref >60)
Glucose, Bld: 102 mg/dL — ABNORMAL HIGH (ref 70–99)
Potassium: 4.8 mmol/L (ref 3.5–5.1)
Sodium: 140 mmol/L (ref 135–145)

## 2023-04-18 MED ORDER — FENTANYL CITRATE PF 50 MCG/ML IJ SOSY
50.0000 ug | PREFILLED_SYRINGE | Freq: Once | INTRAMUSCULAR | Status: AC
  Administered 2023-04-18: 50 ug via INTRAVENOUS
  Filled 2023-04-18: qty 1

## 2023-04-18 MED ORDER — LACTATED RINGERS IV BOLUS
1000.0000 mL | Freq: Once | INTRAVENOUS | Status: AC
  Administered 2023-04-18: 1000 mL via INTRAVENOUS

## 2023-04-18 MED ORDER — KETOROLAC TROMETHAMINE 15 MG/ML IJ SOLN
15.0000 mg | Freq: Once | INTRAMUSCULAR | Status: AC
  Administered 2023-04-18: 15 mg via INTRAVENOUS
  Filled 2023-04-18: qty 1

## 2023-04-18 MED ORDER — ACETAMINOPHEN 500 MG PO TABS
1000.0000 mg | ORAL_TABLET | Freq: Once | ORAL | Status: AC
  Administered 2023-04-18: 1000 mg via ORAL
  Filled 2023-04-18: qty 2

## 2023-04-18 MED ORDER — OXYCODONE HCL 5 MG PO TABS
5.0000 mg | ORAL_TABLET | Freq: Four times a day (QID) | ORAL | 0 refills | Status: AC | PRN
Start: 2023-04-18 — End: 2023-04-21

## 2023-04-18 MED ORDER — LIDOCAINE-EPINEPHRINE (PF) 2 %-1:200000 IJ SOLN
20.0000 mL | Freq: Once | INTRAMUSCULAR | Status: AC
  Administered 2023-04-18: 20 mL via INTRADERMAL
  Filled 2023-04-18: qty 20

## 2023-04-18 MED ORDER — FENTANYL CITRATE PF 50 MCG/ML IJ SOSY: 25.0000 ug | PREFILLED_SYRINGE | Freq: Once | INTRAMUSCULAR | Status: DC

## 2023-04-18 MED ORDER — FENTANYL CITRATE PF 50 MCG/ML IJ SOSY
50.0000 ug | PREFILLED_SYRINGE | Freq: Once | INTRAMUSCULAR | Status: AC
  Administered 2023-04-18: 50 ug via INTRAVENOUS
  Filled 2023-04-18 (×2): qty 1

## 2023-04-18 NOTE — ED Triage Notes (Signed)
Pt BIB EMS from [facility] nursing [facility] for a fall.  Pt reports feeling dizzy and falling around 0400.  Pt complaining of l wrist pain. Denies hitting head. Dose not take blood thinners. 650 of tylenol given at facility.

## 2023-04-18 NOTE — ED Provider Notes (Signed)
[place] EMERGENCY DEPARTMENT AT [facility] Provider Note   CSN: [id number] Arrival date & time: 04/18/23  0740     History  Chief Complaint  Patient presents with   Bianca Murray    Bianca Murray is a 67 y.o. female with PMH as listed below who presents from nursing facility with fall. She states she does not know why she fell. Patient did not hit her head or lose consciousness. Denies neck or back pain. Has 10/10 left wrist pain. Denies numbness/tingling in wrist. Also endorsing some pain in R ribs/chest after the fall. Otherwise has been in [facility].    Past Medical History:  Diagnosis Date   Arthritis    Asthma    Cancer (HCC)    Chronic respiratory failure with hypoxia and hypercapnia (HCC)    COPD (chronic obstructive pulmonary disease) (HCC)    Hypertension        Home Medications Prior to Admission medications   Medication Sig Start Date End Date Taking? Authorizing Provider  acetaminophen (TYLENOL) 500 MG tablet Take 1,000 mg by mouth 3 (three) times daily.   Yes [provider]  albuterol (PROVENTIL) (2.5 MG/3ML) 0.083% nebulizer solution USE ONE VIAL (2.5 MG TOTAL) BY NEBULIZATION 4  TIMES DAILY as needed. Patient taking differently: Take 2.5 mg by nebulization every 6 (six) hours as needed for wheezing or shortness of breath. 12/04/22  Yes Azucena Fallen, MD  albuterol (VENTOLIN HFA) 108 (90 Base) MCG/ACT inhaler Inhale 2 puffs into the lungs every 6 (six) hours as needed for wheezing or shortness of breath. 12/04/22  Yes Azucena Fallen, MD  atorvastatin (LIPITOR) 10 MG tablet Take 1 tablet (10 mg total) by mouth daily. 09/20/22  Yes Storm Frisk, MD  benzonatate (TESSALON) 100 MG capsule Take 1 capsule (100 mg total) by mouth 3 (three) times daily as needed for cough. Patient taking differently: Take 100 mg by mouth every 8 (eight) hours as needed for cough. 03/21/23  Yes Storm Frisk, MD  budesonide (PULMICORT) 0.5 MG/2ML nebulizer  solution Take 2 mLs (0.5 mg total) by nebulization 2 (two) times daily. 11/22/22 04/18/23 Yes Storm Frisk, MD  busPIRone (BUSPAR) 10 MG tablet Take 1 tablet (10 mg total) by mouth 2 (two) times daily. Patient taking differently: Take 10 mg by mouth 3 (three) times daily. 12/04/22  Yes Azucena Fallen, MD  cetirizine (ZYRTEC) 10 MG tablet Take 10 mg by mouth daily.   Yes [provider]  cyclobenzaprine (FLEXERIL) 10 MG tablet TAKE 1 TABLET (10 MG TOTAL) BY MOUTH 3 (THREE) TIMES DAILY AS NEEDED FOR MUSCLE SPASMS. Patient taking differently: Take 10 mg by mouth every 8 (eight) hours as needed for muscle spasms. 11/29/22  Yes Storm Frisk, MD  diltiazem (CARDIZEM CD) 120 MG 24 hr capsule Take 1 capsule (120 mg total) by mouth daily. 12/05/22  Yes Azucena Fallen, MD  docusate sodium (COLACE) 100 MG capsule Take 100 mg by mouth 2 (two) times daily as needed for mild constipation.   Yes [provider]  ferrous sulfate 325 (65 FE) MG tablet Take 325 mg by mouth daily.   Yes [provider]  fluticasone (FLONASE) 50 MCG/ACT nasal spray Place 2 sprays into both nostrils daily. 12/04/22  Yes Azucena Fallen, MD  formoterol (PERFOROMIST) 20 MCG/2ML nebulizer solution Take 2 mLs (20 mcg total) by nebulization 2 (two) times daily. 11/22/22  Yes Storm Frisk, MD  gabapentin (NEURONTIN) 100 MG capsule Take  1 capsule (100 mg total) by mouth 3 (three) times daily. 09/20/22  Yes Storm Frisk, MD  guaiFENesin (ROBITUSSIN) 100 MG/5ML liquid Take 5 mLs by mouth every 6 (six) hours as needed for cough.   Yes [provider]  hydrOXYzine (ATARAX) 25 MG tablet TAKE 1 TABLET (25 MG TOTAL) BY MOUTH 3 (THREE) TIMES DAILY AS NEEDED FOR ANXIETY. Patient taking differently: Take 25 mg by mouth every 6 (six) hours as needed for anxiety. 11/29/22  Yes Storm Frisk, MD  ipratropium-albuterol (DUONEB) 0.5-2.5 (3) MG/3ML SOLN Take 3 mLs by nebulization 4 (four) times  daily.   Yes [provider]  nystatin powder Apply 1 Application topically every 8 (eight) hours as needed (wound care). Apply under breast   Yes [provider]  oxyCODONE (ROXICODONE) 5 MG immediate release tablet Take 1 tablet (5 mg total) by mouth every 6 (six) hours as needed for up to 3 days for severe pain. 04/18/23 04/21/23 Yes Loetta Rough, MD  pantoprazole (PROTONIX) 40 MG tablet TAKE 1 TABLET BY MOUTH 2 TIMES DAILY BEFORE A MEAL (AM+EVENING) Patient taking differently: Take 40 mg by mouth 2 (two) times daily before a meal. 11/29/22  Yes Storm Frisk, MD  Polyethyl Glycol-Propyl Glycol (SYSTANE) 0.4-0.3 % SOLN Place 1 drop into the right eye in the morning, at noon, in the evening, and at bedtime.   Yes [provider]  Sertraline HCl 150 MG CAPS Take 150 mg by mouth daily.   Yes [provider]  sodium chloride (OCEAN) 0.65 % SOLN nasal spray Place 2 sprays into both nostrils as needed for congestion. 12/04/22  Yes Azucena Fallen, MD  Vitamin D, Ergocalciferol, (DRISDOL) 1.25 MG (50000 UNIT) CAPS capsule Take 1 capsule (50,000 Units total) by mouth every 7 (seven) days. Patient taking differently: Take 50,000 Units by mouth every Monday. 09/20/22  Yes Storm Frisk, MD  YUPELRI 175 MCG/3ML nebulizer solution Take 3 mLs (175 mcg total) by nebulization daily. 11/22/22  Yes Storm Frisk, MD  OXYGEN Inhale 4-5 L/min into the lungs continuous. 01/19/22   [provider]      Allergies    Prednisone    Review of Systems   Review of Systems A 10 point review of systems was performed and is negative unless otherwise reported in HPI.  Physical Exam Updated Vital Signs BP (!) 153/87   Pulse (!) 101   Temp 98.2 F (36.8 C) (Oral)   Resp 18   Ht 5\' 3"  (1.6 m)   Wt 94.3 kg   SpO2 94%   BMI 36.83 kg/m  Physical Exam General: Uncomfortable appearing female, lying in bed.  HEENT: NCAT. PERRLA, Sclera anicteric, MMM, trachea  midline.  Cardiology: Tachycardic regular rate, no murmurs/rubs/gallops. Some TTP noted to R lateral lower ribs without crepitus or deformity noted, no gross signs of trauma.  Resp: Normal respiratory rate and effort. CTAB, no wheezes, rhonchi, crackles.  Abd: Soft, non-tender, non-distended. No rebound tenderness or guarding.  GU: Deferred. MSK: Deformity noted to L wrist. Intact L radial pulse. Intact sensation to L hand with intact active ROM to L fingers/hand and thumb opposition. No other peripheral edema or signs of trauma.  Skin: warm, dry.  Back: No midline C spine TTP.  Neuro: A&Ox4, CNs II-XII grossly intact. MAEs. Sensation grossly intact.  Psych: Normal mood and affect.   ED Results / Procedures / Treatments   Labs (all labs ordered are listed, but only abnormal results  are displayed) Labs Reviewed  BASIC METABOLIC PANEL - Abnormal; Notable for the following components:      Result Value   Glucose, Bld 102 (*)    Calcium 8.6 (*)    All other components within normal limits  CBC WITH DIFFERENTIAL/PLATELET - Abnormal; Notable for the following components:   RBC 3.39 (*)    Hemoglobin 9.4 (*)    HCT 32.0 (*)    MCHC 29.4 (*)    All other components within normal limits  CBC WITH DIFFERENTIAL/PLATELET    EKG EKG Interpretation Date/Time:  Thursday April 18 2023 08:00:00 EDT Ventricular Rate:  106 PR Interval:  149 QRS Duration:  79 QT Interval:  334 QTC Calculation: 444 R Axis:   79  Text Interpretation: Sinus tachycardia Atrial premature complex Low voltage, precordial leads Confirmed by Vivi Barrack [phone]) on 04/18/2023 9:29:36 AM  Radiology DG Wrist 2 Views Left  Result Date: 04/18/2023 CLINICAL DATA:  Left wrist pain after fall today. EXAM: LEFT WRIST - 2 VIEW COMPARISON:  None Available. FINDINGS: Comminuted distal left radial fracture is noted with intra-articular extension. Mildly displaced ulnar styloid fracture is noted as well. IMPRESSION: Comminuted  distal left radial fracture with intra-articular extension. Mildly displaced ulnar styloid fracture. Electronically Signed   By: Lupita Raider M.D.   On: 04/18/2023 11:54   DG Chest 2 View  Result Date: 04/18/2023 CLINICAL DATA:  fall, R sided rib pain. EXAM: CHEST - 2 VIEW COMPARISON:  12/02/2022. FINDINGS: Redemonstration of linear area of atelectasis/scarring overlying the left lower lung zone. Bilateral lung fields are otherwise clear. No acute consolidation or major lung collapse. Bilateral costophrenic angles are clear. Normal cardio-mediastinal silhouette. No acute osseous abnormalities. The soft tissues are within normal limits. IMPRESSION: 1. No active cardiopulmonary disease. Electronically Signed   By: Jules Schick M.D.   On: 04/18/2023 10:15   DG Wrist Complete Left  Result Date: 04/18/2023 CLINICAL DATA:  Fall.  Swelling distal left forearm. EXAM: LEFT WRIST - COMPLETE 3+ VIEW COMPARISON:  None Available. FINDINGS: There is comminuted, impacted and displaced fracture of the distal left radial metaphysis with intra-articular extension. There is also slightly displaced fracture of the ulnar styloid process. No other acute fracture or dislocation. No aggressive osseous lesion. There are mild diffuse degenerative changes of imaged joints. No radiopaque foreign bodies. Soft tissues are within normal limits. IMPRESSION: 1. Comminuted, impacted and displaced fracture of the distal left radial metaphysis with intra-articular extension. 2. Ulnar styloid process fracture. Electronically Signed   By: Jules Schick M.D.   On: 04/18/2023 10:13    Procedures .Ortho Injury Treatment  Date/Time: 04/18/2023 10:07 AM  Performed by: Loetta Rough, MD Authorized by: Loetta Rough, MD   Consent:    Consent obtained:  Verbal   Consent given by:  Patient   Risks discussed:  Fracture, irreducible dislocation, recurrent dislocation, nerve damage, restricted joint movement and stiffness   Alternatives  discussed:  No treatmentInjury location: wrist Location details: left wrist Injury type: fracture Fracture type: distal radius and ulnar styloid Pre-procedure neurovascular assessment: neurovascularly intact Pre-procedure distal perfusion: normal Pre-procedure neurological function: normal Pre-procedure range of motion: reduced Anesthesia: hematoma block  Anesthesia: Local anesthesia used: yes Local Anesthetic: lidocaine 2% with epinephrine Anesthetic total: 8 mL  Patient sedated: NoManipulation performed: yes Skin traction used: yes (finger traps) X-ray confirmed reduction: yes Immobilization: splint Splint type: volar short arm Splint Applied by: Milon Dikes Post-procedure neurovascular assessment: post-procedure neurovascularly intact Post-procedure distal perfusion: normal  Post-procedure neurological function: normal Post-procedure range of motion: unchanged       Medications Ordered in ED Medications  fentaNYL (SUBLIMAZE) injection 50 mcg (50 mcg Intravenous Given 04/18/23 0932)  lidocaine-EPINEPHrine (XYLOCAINE W/EPI) 2 %-1:200000 (PF) injection 20 mL (20 mLs Intradermal Given by Other 04/18/23 1039)  fentaNYL (SUBLIMAZE) injection 50 mcg (50 mcg Intravenous Given 04/18/23 1007)  lactated ringers bolus 1,000 mL (1,000 mLs Intravenous New Bag/Given 04/18/23 1147)  acetaminophen (TYLENOL) tablet 1,000 mg (1,000 mg Oral Given 04/18/23 1245)  ketorolac (TORADOL) 15 MG/ML injection 15 mg (15 mg Intravenous Given 04/18/23 1246)    ED Course/ Medical Decision Making/ A&P                          Medical Decision Making Amount and/or Complexity of Data Reviewed Labs: ordered. Decision-making details documented in ED Course. Radiology: ordered. Decision-making details documented in ED Course.  Risk OTC drugs. Prescription drug management.    This patient presents to the ED for concern of fall with left wrist pain., this involves an extensive number of treatment options, and is a  complaint that carries with it a high risk of complications and morbidity.  I considered the following differential and admission for this acute, potentially life threatening condition.   MDM:    Patient presenting with fall recently with no head strike, head neck or back pain, ruled out by Nexus criteria does not need head or neck imaging.  She does have noted deformity to her left wrist and x-ray notes a comminuted displaced impacted fracture of the left distal radius as well as ulnar styloid process fracture.  She is overall neurovascularly intact.  She also complains of some right rib pain however chest x-ray does not demonstrate any rib fractures, pneumothorax, hemothorax.  She is consented verbally for a hematoma block as well as reduction using finger traps.  After IV analgesia and hematoma block, patient is reduced in finger traps successfully confirmed with x-ray and is splinted.  She is neurovascularly intact after the reduction as well.  On further interview patient states that she did feel dizzy when she got up this morning after standing to go to the bathroom from lying down and felt dizzy which is why she fell.  She states she thinks she is dehydrated and in fact she is tachycardic into the 110s.  She denies any chest pain or shortness of breath, leg swelling, hematochezia or melena, hematemesis, and her hemoglobin is stable at 9.4.  Most likely she fell due to orthostatic dizziness.  She does not feel dizzy anymore and after 1 L IV fluid her tachycardia improves.  Clinical Course as of 04/18/23 1324  Thu Apr 18, 2023  1112 DG Wrist Complete Left 1. Comminuted, impacted and displaced fracture of the distal left radial metaphysis with intra-articular extension. 2. Ulnar styloid process fracture.   [HN]  1113 DG Chest 2 View clear [HN]  1113 Much improved alignment after hematoma block/traction with finger splints, patient is splinted [HN]  1113 Basic metabolic panel(!) Unremarkable in  the context of this patient's presentation   [HN]  1113 Hemoglobin(!): 9.4 Stable from  4 months ago [HN]  1113 WBC: 7.3 No leukocytosis  [HN]  1138 Despite pain medication and reduction of fracture, patient is persistently tachycardic into 110s. She states this is not normal for her. She told me she didn't know why she fell but told the bedside RN that she fell because she felt  dizzy when she got up earlier. Denies dizziness now or vertigo. Feels normal now. Possibly this is the reason she got dizzy when she went to the bathroom earlier, could have been orthostatic or dehydrated. Patient states this is possible and that she feels dehydrated. Will give 1L LR.  [HN]  1314 Patient reevaluated. Her HR now is 100 bpm after 1L LR. She states she does not feel dizzy and feels well.  She does not have any nausea or vomiting and is able to hydrate orally from now on.  I encouraged the patient to stay well-hydrated at home and informed her that she was likely dehydrated which is probably what made her fall.  She also denies again any chest pain or shortness of breath.  She is neurovascularly intact in her left upper extremity after the splint and is in a sling.  I encouraged her to be nonweightbearing in the left upper extremity and to follow-up with an orthopedic doctor, she is given the contact information to call and follow-up within the next 1 to 2 weeks.  Patient is given discharge instructions and return precautions, all questions answered to patient satisfaction. [HN]    Clinical Course User Index [HN] Loetta Rough, MD    Imaging Studies ordered: I ordered imaging studies including XR L wrist, CXR I independently visualized and interpreted imaging. I agree with the radiologist interpretation  Additional history obtained from chart review.   Cardiac Monitoring: The patient was maintained on a cardiac monitor.  I personally viewed and interpreted the cardiac monitored which showed an underlying  rhythm of: Sinus tachycardia, and then normal sinus rhythm  Reevaluation: After the interventions noted above, I reevaluated the patient and found that they have :improved  Social Determinants of Health: Lives at nursing home  Disposition: DC with discharge instructions, return precautions, oxycodone as needed for breakthrough pain and orthopedic surgery follow-up.  Co morbidities that complicate the patient evaluation  Past Medical History:  Diagnosis Date   Arthritis    Asthma    Cancer (HCC)    Chronic respiratory failure with hypoxia and hypercapnia (HCC)    COPD (chronic obstructive pulmonary disease) (HCC)    Hypertension      Medicines Meds ordered this encounter  Medications   DISCONTD: fentaNYL (SUBLIMAZE) injection 25 mcg   fentaNYL (SUBLIMAZE) injection 50 mcg   lidocaine-EPINEPHrine (XYLOCAINE W/EPI) 2 %-1:200000 (PF) injection 20 mL   fentaNYL (SUBLIMAZE) injection 50 mcg   lactated ringers bolus 1,000 mL   acetaminophen (TYLENOL) tablet 1,000 mg   ketorolac (TORADOL) 15 MG/ML injection 15 mg   oxyCODONE (ROXICODONE) 5 MG immediate release tablet    Sig: Take 1 tablet (5 mg total) by mouth every 6 (six) hours as needed for up to 3 days for severe pain.    Dispense:  12 tablet    Refill:  0    I have reviewed the patients home medicines and have made adjustments as needed  Problem List / ED Course: Problem List Items Addressed This Visit   None Visit Diagnoses     Fall, initial encounter    -  Primary   Orthostatic dizziness       Dehydration       Other closed intra-articular fracture of distal end of left radius, initial encounter       Traumatic closed fracture of ulnar styloid with minimal displacement, left, initial encounter  This note was created using dictation software, which may contain spelling or grammatical errors.    Loetta Rough, MD 04/18/23 1324

## 2023-04-18 NOTE — Progress Notes (Signed)
Orthopedic Tech Progress Note Patient Details:  Bianca Murray 10-02-1955 [id number]  Ortho Devices Type of Ortho Device: Finger trap, Sugartong splint Finger Trap Weight: 10 Ortho Device/Splint Location: left Ortho Device/Splint Interventions: Ordered, Application, Adjustment   Post Interventions Patient Tolerated: Well Instructions Provided: Adjustment of device, Care of device  Tonye Pearson 04/18/2023, 10:42 AM

## 2023-04-18 NOTE — Discharge Instructions (Signed)
Thank you for coming to [facility] Emergency Department. You were seen for dizziness and fall with wrist pain. We did an exam, labs, and imaging, and these showed left wrist fracture and dehydration. You were treated with a reduction of the fracture and splint as well as IV fluids. Please stay well hydrated at home. Please call [facility] today to make a follow-up appointment for within the next 1-2 weeks. You can take 650mg  tylenol (acetaminophen) every 4-6 hours. We have also prescribed a short course of oxycodone 5 mg to use every 6 hours as needed for breakthrough pain. Please take miralax 1 capful per day and do not drive while taking this medication .    Do not hesitate to return to the ED or call 911 if you experience: -Worsening symptoms -Numbness tingling -Recurrent falls -Chest pain -Lightheadedness, passing out -Fevers/chills -Anything else that concerns you

## 2023-04-18 NOTE — ED Notes (Signed)
Called to [facility] to pick up patient

## 2023-05-29 ENCOUNTER — Ambulatory Visit: Payer: Self-pay | Admitting: *Deleted

## 2023-05-29 NOTE — Telephone Encounter (Signed)
Noted  

## 2023-05-29 NOTE — Telephone Encounter (Signed)
Daughter Loma Newton called in, not on DPR, telling the agent her mother was wheezing and positive for Covid.   Requesting Dr. Delford Field send in the same medicine he did last year when her mother had covid. She hung up before agent connected [place].  I attempted to call pt back directly, since daughter is not on the DPR, on her number [phone].   Left a voicemail to return the call to [facility] and left the number.

## 2023-06-03 ENCOUNTER — Other Ambulatory Visit: Payer: Self-pay

## 2023-06-03 ENCOUNTER — Emergency Department (HOSPITAL_COMMUNITY): Payer: 59

## 2023-06-03 ENCOUNTER — Encounter (HOSPITAL_COMMUNITY): Payer: Self-pay | Admitting: Emergency Medicine

## 2023-06-03 ENCOUNTER — Inpatient Hospital Stay (HOSPITAL_COMMUNITY)
Admission: EM | Admit: 2023-06-03 | Discharge: 2023-06-07 | DRG: 191 | Disposition: A | Discharge status: Skilled Nursing Facility | Payer: 59 | Attending: Internal Medicine | Admitting: Internal Medicine

## 2023-06-03 DIAGNOSIS — F411 Generalized anxiety disorder: Secondary | ICD-10-CM | POA: Diagnosis present

## 2023-06-03 DIAGNOSIS — Z8616 Personal history of COVID-19: Secondary | ICD-10-CM

## 2023-06-03 DIAGNOSIS — Z888 Allergy status to other drugs, medicaments and biological substances status: Secondary | ICD-10-CM

## 2023-06-03 DIAGNOSIS — J441 Chronic obstructive pulmonary disease with (acute) exacerbation: Principal | ICD-10-CM

## 2023-06-03 DIAGNOSIS — E782 Mixed hyperlipidemia: Secondary | ICD-10-CM | POA: Diagnosis present

## 2023-06-03 DIAGNOSIS — J9611 Chronic respiratory failure with hypoxia: Secondary | ICD-10-CM | POA: Diagnosis present

## 2023-06-03 DIAGNOSIS — Z809 Family history of malignant neoplasm, unspecified: Secondary | ICD-10-CM

## 2023-06-03 DIAGNOSIS — J9612 Chronic respiratory failure with hypercapnia: Secondary | ICD-10-CM | POA: Diagnosis present

## 2023-06-03 DIAGNOSIS — Z87891 Personal history of nicotine dependence: Secondary | ICD-10-CM

## 2023-06-03 DIAGNOSIS — Z79899 Other long term (current) drug therapy: Secondary | ICD-10-CM

## 2023-06-03 DIAGNOSIS — D649 Anemia, unspecified: Secondary | ICD-10-CM | POA: Diagnosis present

## 2023-06-03 DIAGNOSIS — Z7951 Long term (current) use of inhaled steroids: Secondary | ICD-10-CM

## 2023-06-03 DIAGNOSIS — Z9981 Dependence on supplemental oxygen: Secondary | ICD-10-CM

## 2023-06-03 DIAGNOSIS — K219 Gastro-esophageal reflux disease without esophagitis: Secondary | ICD-10-CM | POA: Diagnosis present

## 2023-06-03 DIAGNOSIS — I10 Essential (primary) hypertension: Secondary | ICD-10-CM | POA: Diagnosis present

## 2023-06-03 DIAGNOSIS — Z8249 Family history of ischemic heart disease and other diseases of the circulatory system: Secondary | ICD-10-CM

## 2023-06-03 DIAGNOSIS — Z825 Family history of asthma and other chronic lower respiratory diseases: Secondary | ICD-10-CM

## 2023-06-03 DIAGNOSIS — R262 Difficulty in walking, not elsewhere classified: Secondary | ICD-10-CM | POA: Diagnosis present

## 2023-06-03 LAB — CBC WITH DIFFERENTIAL/PLATELET
Abs Immature Granulocytes: 0.02 10*3/uL (ref 0.00–0.07)
Basophils Absolute: 0 10*3/uL (ref 0.0–0.1)
Basophils Relative: 0 %
Eosinophils Absolute: 0.4 10*3/uL (ref 0.0–0.5)
Eosinophils Relative: 8 %
HCT: 35.5 % — ABNORMAL LOW (ref 36.0–46.0)
Hemoglobin: 10.5 g/dL — ABNORMAL LOW (ref 12.0–15.0)
Immature Granulocytes: 0 %
Lymphocytes Relative: 29 %
Lymphs Abs: 1.4 10*3/uL (ref 0.7–4.0)
MCH: 28 pg (ref 26.0–34.0)
MCHC: 29.6 g/dL — ABNORMAL LOW (ref 30.0–36.0)
MCV: 94.7 fL (ref 80.0–100.0)
Monocytes Absolute: 0.6 10*3/uL (ref 0.1–1.0)
Monocytes Relative: 12 %
Neutro Abs: 2.5 10*3/uL (ref 1.7–7.7)
Neutrophils Relative %: 51 %
Platelets: 339 10*3/uL (ref 150–400)
RBC: 3.75 MIL/uL — ABNORMAL LOW (ref 3.87–5.11)
RDW: 14.2 % (ref 11.5–15.5)
WBC: 4.9 10*3/uL (ref 4.0–10.5)
nRBC: 0 % (ref 0.0–0.2)

## 2023-06-03 LAB — BASIC METABOLIC PANEL
Anion gap: 10 (ref 5–15)
BUN: 9 mg/dL (ref 8–23)
CO2: 33 mmol/L — ABNORMAL HIGH (ref 22–32)
Calcium: 8.9 mg/dL (ref 8.9–10.3)
Chloride: 98 mmol/L (ref 98–111)
Creatinine, Ser: 0.72 mg/dL (ref 0.44–1.00)
GFR, Estimated: >60 mL/min (ref >60)
Glucose, Bld: 103 mg/dL — ABNORMAL HIGH (ref 70–99)
Potassium: 3.9 mmol/L (ref 3.5–5.1)
Sodium: 141 mmol/L (ref 135–145)

## 2023-06-03 LAB — BRAIN NATRIURETIC PEPTIDE: B Natriuretic Peptide: 29 pg/mL (ref 0.0–100.0)

## 2023-06-03 MED ORDER — METHYLPREDNISOLONE SODIUM SUCC 125 MG IJ SOLR
125.0000 mg | Freq: Once | INTRAMUSCULAR | Status: AC
  Administered 2023-06-03: 125 mg via INTRAVENOUS
  Filled 2023-06-03: qty 2

## 2023-06-03 MED ORDER — IOHEXOL 350 MG/ML SOLN
75.0000 mL | Freq: Once | INTRAVENOUS | Status: AC | PRN
  Administered 2023-06-03: 75 mL via INTRAVENOUS

## 2023-06-03 MED ORDER — ALBUTEROL SULFATE (2.5 MG/3ML) 0.083% IN NEBU
5.0000 mg | INHALATION_SOLUTION | Freq: Once | RESPIRATORY_TRACT | Status: AC
  Administered 2023-06-04: 5 mg via RESPIRATORY_TRACT
  Filled 2023-06-03: qty 6

## 2023-06-03 MED ORDER — IPRATROPIUM-ALBUTEROL 0.5-2.5 (3) MG/3ML IN SOLN
3.0000 mL | Freq: Once | RESPIRATORY_TRACT | Status: AC
  Administered 2023-06-03: 3 mL via RESPIRATORY_TRACT
  Filled 2023-06-03: qty 3

## 2023-06-03 NOTE — ED Provider Notes (Signed)
[facility] AT [facility] Provider Note   CSN: [id number] Arrival date & time: 06/03/23  5284     History {Add pertinent medical, surgical, social history, OB history to HPI:1} Chief Complaint  Patient presents with   Shortness of Breath    Bianca Murray is a 67 y.o. female.  Pt is a 67 yo female with pmhx significant for COPD (on 4L chronically), arthritis, and ambulatory dysfunction.  Pt tested positive for Covid at her SNF on 10/16.  She is still having sob.  She has been coughing.  No fever.       Home Medications Prior to Admission medications   Medication Sig Start Date End Date Taking? Authorizing Provider  acetaminophen (TYLENOL) 500 MG tablet Take 1,000 mg by mouth 3 (three) times daily.    [provider]  albuterol (PROVENTIL) (2.5 MG/3ML) 0.083% nebulizer solution USE ONE VIAL (2.5 MG TOTAL) BY NEBULIZATION 4  TIMES DAILY as needed. Patient taking differently: Take 2.5 mg by nebulization every 6 (six) hours as needed for wheezing or shortness of breath. 12/04/22   Azucena Fallen, MD  albuterol (VENTOLIN HFA) 108 (90 Base) MCG/ACT inhaler Inhale 2 puffs into the lungs every 6 (six) hours as needed for wheezing or shortness of breath. 12/04/22   Azucena Fallen, MD  atorvastatin (LIPITOR) 10 MG tablet Take 1 tablet (10 mg total) by mouth daily. 09/20/22   Storm Frisk, MD  benzonatate (TESSALON) 100 MG capsule Take 1 capsule (100 mg total) by mouth 3 (three) times daily as needed for cough. Patient taking differently: Take 100 mg by mouth every 8 (eight) hours as needed for cough. 03/21/23   Storm Frisk, MD  budesonide (PULMICORT) 0.5 MG/2ML nebulizer solution Take 2 mLs (0.5 mg total) by nebulization 2 (two) times daily. 11/22/22 04/18/23  Storm Frisk, MD  busPIRone (BUSPAR) 10 MG tablet Take 1 tablet (10 mg total) by mouth 2 (two) times daily. Patient taking differently: Take 10 mg by mouth 3 (three) times  daily. 12/04/22   Azucena Fallen, MD  cetirizine (ZYRTEC) 10 MG tablet Take 10 mg by mouth daily.    [provider]  cyclobenzaprine (FLEXERIL) 10 MG tablet TAKE 1 TABLET (10 MG TOTAL) BY MOUTH 3 (THREE) TIMES DAILY AS NEEDED FOR MUSCLE SPASMS. Patient taking differently: Take 10 mg by mouth every 8 (eight) hours as needed for muscle spasms. 11/29/22   Storm Frisk, MD  diltiazem (CARDIZEM CD) 120 MG 24 hr capsule Take 1 capsule (120 mg total) by mouth daily. 12/05/22   Azucena Fallen, MD  docusate sodium (COLACE) 100 MG capsule Take 100 mg by mouth 2 (two) times daily as needed for mild constipation.    [provider]  ferrous sulfate 325 (65 FE) MG tablet Take 325 mg by mouth daily.    [provider]  fluticasone (FLONASE) 50 MCG/ACT nasal spray Place 2 sprays into both nostrils daily. 12/04/22   Azucena Fallen, MD  formoterol (PERFOROMIST) 20 MCG/2ML nebulizer solution Take 2 mLs (20 mcg total) by nebulization 2 (two) times daily. 11/22/22   Storm Frisk, MD  gabapentin (NEURONTIN) 100 MG capsule Take 1 capsule (100 mg total) by mouth 3 (three) times daily. 09/20/22   Storm Frisk, MD  guaiFENesin (ROBITUSSIN) 100 MG/5ML liquid Take 5 mLs by mouth every 6 (six) hours as needed for cough.    [provider]  hydrOXYzine (ATARAX) 25 MG tablet TAKE  1 TABLET (25 MG TOTAL) BY MOUTH 3 (THREE) TIMES DAILY AS NEEDED FOR ANXIETY. Patient taking differently: Take 25 mg by mouth every 6 (six) hours as needed for anxiety. 11/29/22   Storm Frisk, MD  ipratropium-albuterol (DUONEB) 0.5-2.5 (3) MG/3ML SOLN Take 3 mLs by nebulization 4 (four) times daily.    [provider]  nystatin powder Apply 1 Application topically every 8 (eight) hours as needed (wound care). Apply under breast    [provider]  OXYGEN Inhale 4-5 L/min into the lungs continuous. 01/19/22   [provider]  pantoprazole (PROTONIX) 40 MG tablet  TAKE 1 TABLET BY MOUTH 2 TIMES DAILY BEFORE A MEAL (AM+EVENING) Patient taking differently: Take 40 mg by mouth 2 (two) times daily before a meal. 11/29/22   Storm Frisk, MD  Polyethyl Glycol-Propyl Glycol (SYSTANE) 0.4-0.3 % SOLN Place 1 drop into the right eye in the morning, at noon, in the evening, and at bedtime.    [provider]  Sertraline HCl 150 MG CAPS Take 150 mg by mouth daily.    [provider]  sodium chloride (OCEAN) 0.65 % SOLN nasal spray Place 2 sprays into both nostrils as needed for congestion. 12/04/22   Azucena Fallen, MD  Vitamin D, Ergocalciferol, (DRISDOL) 1.25 MG (50000 UNIT) CAPS capsule Take 1 capsule (50,000 Units total) by mouth every 7 (seven) days. Patient taking differently: Take 50,000 Units by mouth every Monday. 09/20/22   Storm Frisk, MD  YUPELRI 175 MCG/3ML nebulizer solution Take 3 mLs (175 mcg total) by nebulization daily. 11/22/22   Storm Frisk, MD      Allergies    Prednisone    Review of Systems   Review of Systems  Respiratory:  Positive for cough and shortness of breath.   All other systems reviewed and are negative.   Physical Exam Updated Vital Signs BP 116/79 (BP Location: Left Arm)   Pulse 89   Temp 98.5 F (36.9 C) (Oral)   Resp 17   SpO2 99%  Physical Exam Vitals and nursing note reviewed.  Constitutional:      Appearance: She is well-developed.  HENT:     Head: Normocephalic and atraumatic.     Mouth/Throat:     Mouth: Mucous membranes are moist.     Pharynx: Oropharynx is clear.  Eyes:     Extraocular Movements: Extraocular movements intact.     Pupils: Pupils are equal, round, and reactive to light.  Cardiovascular:     Rate and Rhythm: Normal rate and regular rhythm.  Pulmonary:     Effort: Tachypnea present.     Breath sounds: Wheezing present.  Abdominal:     General: Bowel sounds are normal.     Palpations: Abdomen is soft.  Musculoskeletal:        General: Normal range of  motion.     Cervical back: Normal range of motion and neck supple.  Skin:    General: Skin is warm.     Capillary Refill: Capillary refill takes less than 2 seconds.  Neurological:     General: No focal deficit present.     Mental Status: She is alert and oriented to person, place, and time.  Psychiatric:        Mood and Affect: Mood normal.        Behavior: Behavior normal.     ED Results / Procedures / Treatments   Labs (all labs ordered are listed, but only abnormal results are  displayed) Labs Reviewed  BASIC METABOLIC PANEL - Abnormal; Notable for the following components:      Result Value   CO2 33 (*)    Glucose, Bld 103 (*)    All other components within normal limits  CBC WITH DIFFERENTIAL/PLATELET - Abnormal; Notable for the following components:   RBC 3.75 (*)    Hemoglobin 10.5 (*)    HCT 35.5 (*)    MCHC 29.6 (*)    All other components within normal limits  BRAIN NATRIURETIC PEPTIDE    EKG EKG Interpretation Date/Time:  Monday June 03 2023 19:49:41 EDT Ventricular Rate:  87 PR Interval:  134 QRS Duration:  71 QT Interval:  390 QTC Calculation: 470 R Axis:   76  Text Interpretation: Sinus rhythm No significant change since last tracing Confirmed by Jacalyn Lefevre [phone]) on 06/03/2023 8:12:41 PM  Radiology DG Chest Port 1 View  Result Date: 06/03/2023 CLINICAL DATA:  Shortness of breath, tested positive for COVID on 10/16. EXAM: PORTABLE CHEST 1 VIEW COMPARISON:  04/18/2023. FINDINGS: The heart size and mediastinal contours are within normal limits. There is atherosclerotic calcification of the aorta. Subsegmental atelectasis or scarring is noted in the mid lungs bilaterally. No consolidation, effusion, or pneumothorax. No acute osseous abnormality. IMPRESSION: Stable chest with no active disease. Electronically Signed   By: Thornell Sartorius M.D.   On: 06/03/2023 22:20    Procedures Procedures  {Document cardiac monitor, telemetry assessment procedure  when appropriate:1}  Medications Ordered in ED Medications  ipratropium-albuterol (DUONEB) 0.5-2.5 (3) MG/3ML nebulizer solution 3 mL (3 mLs Nebulization Given 06/03/23 1954)  methylPREDNISolone sodium succinate (SOLU-MEDROL) 125 mg/2 mL injection 125 mg (125 mg Intravenous Given 06/03/23 2027)  iohexol (OMNIPAQUE) 350 MG/ML injection 75 mL (75 mLs Intravenous Contrast Given 06/03/23 2114)    ED Course/ Medical Decision Making/ A&P   {   Click here for ABCD2, HEART and other calculatorsREFRESH Note before signing :1}                              Medical Decision Making Amount and/or Complexity of Data Reviewed Labs: ordered. Radiology: ordered.  Risk Prescription drug management.   This patient presents to the ED for concern of sob, this involves an extensive number of treatment options, and is a complaint that carries with it a high risk of complications and morbidity.  The differential diagnosis includes covid, pe, copd, pna   Co morbidities that complicate the patient evaluation  COPD (on 4L chronically), arthritis, and ambulatory dysfunction   Additional history obtained:  Additional history obtained from epic chart review External records from outside source obtained and reviewed including EMS report   Lab Tests:  I Ordered, and personally interpreted labs.  The pertinent results include:  cbc with hgb 10.5 (stable); bmp nl   Imaging Studies ordered:  I ordered imaging studies including cxr, cta chest  I independently visualized and interpreted imaging which showed  CXR: CT angio chest:  I agree with the radiologist interpretation   Cardiac Monitoring:  The patient was maintained on a cardiac monitor.  I personally viewed and interpreted the cardiac monitored which showed an underlying rhythm of: nsr   Medicines ordered and prescription drug management:  I ordered medication including duoneb/solumedrol  for sx  Reevaluation of the patient after these  medicines showed that the patient improved I have reviewed the patients home medicines and have made adjustments as needed   Test  Considered:  Ct chest   Critical Interventions:  ***   Consultations Obtained:  I requested consultation with the ***,  and discussed lab and imaging findings as well as pertinent plan - they recommend: ***   Problem List / ED Course:  COPD exac: improved with meds Covid:  sx started over a week ago.   Reevaluation:  After the interventions noted above, I reevaluated the patient and found that they have :improved   Social Determinants of Health:  Lives in facility   Dispostion:  After consideration of the diagnostic results and the patients response to treatment, I feel that the patent would benefit from admission.    {Document critical care time when appropriate:1} {Document review of labs and clinical decision tools ie heart score, Chads2Vasc2 etc:1}  {Document your independent review of radiology images, and any outside records:1} {Document your discussion with family members, caretakers, and with consultants:1} {Document social determinants of health affecting pt's care:1} {Document your decision making why or why not admission, treatments were needed:1} Final Clinical Impression(s) / ED Diagnoses Final diagnoses:  COPD exacerbation (HCC)    Rx / DC Orders ED Discharge Orders     None

## 2023-06-03 NOTE — ED Triage Notes (Signed)
Pt BIBA from [place] facility presenting with [facility] that has been progressively worsening since pt tested pos for covid on 05/29/23. Hx of COPD. 4L [place] chronic.

## 2023-06-04 DIAGNOSIS — Z8249 Family history of ischemic heart disease and other diseases of the circulatory system: Secondary | ICD-10-CM | POA: Diagnosis not present

## 2023-06-04 DIAGNOSIS — K219 Gastro-esophageal reflux disease without esophagitis: Secondary | ICD-10-CM | POA: Diagnosis present

## 2023-06-04 DIAGNOSIS — Z809 Family history of malignant neoplasm, unspecified: Secondary | ICD-10-CM | POA: Diagnosis not present

## 2023-06-04 DIAGNOSIS — J441 Chronic obstructive pulmonary disease with (acute) exacerbation: Secondary | ICD-10-CM | POA: Diagnosis present

## 2023-06-04 DIAGNOSIS — Z7951 Long term (current) use of inhaled steroids: Secondary | ICD-10-CM | POA: Diagnosis not present

## 2023-06-04 DIAGNOSIS — E782 Mixed hyperlipidemia: Secondary | ICD-10-CM | POA: Diagnosis present

## 2023-06-04 DIAGNOSIS — D649 Anemia, unspecified: Secondary | ICD-10-CM | POA: Diagnosis present

## 2023-06-04 DIAGNOSIS — Z87891 Personal history of nicotine dependence: Secondary | ICD-10-CM | POA: Diagnosis not present

## 2023-06-04 DIAGNOSIS — J9611 Chronic respiratory failure with hypoxia: Secondary | ICD-10-CM | POA: Diagnosis present

## 2023-06-04 DIAGNOSIS — F411 Generalized anxiety disorder: Secondary | ICD-10-CM | POA: Diagnosis present

## 2023-06-04 DIAGNOSIS — I10 Essential (primary) hypertension: Secondary | ICD-10-CM | POA: Diagnosis present

## 2023-06-04 DIAGNOSIS — Z79899 Other long term (current) drug therapy: Secondary | ICD-10-CM | POA: Diagnosis not present

## 2023-06-04 DIAGNOSIS — Z8616 Personal history of COVID-19: Secondary | ICD-10-CM | POA: Diagnosis present

## 2023-06-04 DIAGNOSIS — Z825 Family history of asthma and other chronic lower respiratory diseases: Secondary | ICD-10-CM | POA: Diagnosis not present

## 2023-06-04 DIAGNOSIS — J9612 Chronic respiratory failure with hypercapnia: Secondary | ICD-10-CM | POA: Diagnosis present

## 2023-06-04 DIAGNOSIS — Z9981 Dependence on supplemental oxygen: Secondary | ICD-10-CM | POA: Diagnosis not present

## 2023-06-04 DIAGNOSIS — R262 Difficulty in walking, not elsewhere classified: Secondary | ICD-10-CM | POA: Diagnosis present

## 2023-06-04 DIAGNOSIS — Z888 Allergy status to other drugs, medicaments and biological substances status: Secondary | ICD-10-CM | POA: Diagnosis not present

## 2023-06-04 LAB — BLOOD GAS, VENOUS
Acid-Base Excess: 8.5 mmol/L — ABNORMAL HIGH (ref 0.0–2.0)
Bicarbonate: 35.3 mmol/L — ABNORMAL HIGH (ref 20.0–28.0)
O2 Saturation: 94.9 %
Patient temperature: 37
pCO2, Ven: 57 mm[Hg] (ref 44–60)
pH, Ven: 7.4 (ref 7.25–7.43)
pO2, Ven: 66 mm[Hg] — ABNORMAL HIGH (ref 32–45)

## 2023-06-04 LAB — COMPREHENSIVE METABOLIC PANEL
ALT: 12 U/L (ref 0–44)
AST: 12 U/L — ABNORMAL LOW (ref 15–41)
Albumin: 3.4 g/dL — ABNORMAL LOW (ref 3.5–5.0)
Alkaline Phosphatase: 59 U/L (ref 38–126)
Anion gap: 9 (ref 5–15)
BUN: 9 mg/dL (ref 8–23)
CO2: 33 mmol/L — ABNORMAL HIGH (ref 22–32)
Calcium: 9.3 mg/dL (ref 8.9–10.3)
Chloride: 97 mmol/L — ABNORMAL LOW (ref 98–111)
Creatinine, Ser: 0.61 mg/dL (ref 0.44–1.00)
GFR, Estimated: >60 mL/min (ref >60)
Glucose, Bld: 162 mg/dL — ABNORMAL HIGH (ref 70–99)
Potassium: 4.2 mmol/L (ref 3.5–5.1)
Sodium: 139 mmol/L (ref 135–145)
Total Bilirubin: 0.4 mg/dL (ref 0.3–1.2)
Total Protein: 7 g/dL (ref 6.5–8.1)

## 2023-06-04 LAB — RESP PANEL BY RT-PCR (RSV, FLU A&B, COVID)  RVPGX2
Influenza A by PCR: NEGATIVE
Influenza B by PCR: NEGATIVE
Resp Syncytial Virus by PCR: NEGATIVE
SARS Coronavirus 2 by RT PCR: POSITIVE — AB

## 2023-06-04 LAB — HIV ANTIBODY (ROUTINE TESTING W REFLEX): HIV Screen 4th Generation wRfx: NONREACTIVE

## 2023-06-04 MED ORDER — TRAMADOL HCL 50 MG PO TABS
50.0000 mg | ORAL_TABLET | ORAL | Status: DC | PRN
  Administered 2023-06-05 – 2023-06-06 (×2): 50 mg via ORAL
  Filled 2023-06-04 (×3): qty 1

## 2023-06-04 MED ORDER — ALBUTEROL SULFATE (2.5 MG/3ML) 0.083% IN NEBU
2.5000 mg | INHALATION_SOLUTION | RESPIRATORY_TRACT | Status: DC | PRN
  Administered 2023-06-04 – 2023-06-06 (×4): 2.5 mg via RESPIRATORY_TRACT
  Filled 2023-06-04 (×4): qty 3

## 2023-06-04 MED ORDER — SERTRALINE HCL 100 MG PO TABS
150.0000 mg | ORAL_TABLET | Freq: Every day | ORAL | Status: DC
  Administered 2023-06-04 – 2023-06-07 (×4): 150 mg via ORAL
  Filled 2023-06-04 (×2): qty 2
  Filled 2023-06-04: qty 3
  Filled 2023-06-04: qty 2
  Filled 2023-06-04: qty 1.5

## 2023-06-04 MED ORDER — IPRATROPIUM-ALBUTEROL 0.5-2.5 (3) MG/3ML IN SOLN
3.0000 mL | Freq: Four times a day (QID) | RESPIRATORY_TRACT | Status: DC
  Administered 2023-06-04: 3 mL via RESPIRATORY_TRACT
  Filled 2023-06-04: qty 3

## 2023-06-04 MED ORDER — DOXYCYCLINE HYCLATE 100 MG PO TABS
100.0000 mg | ORAL_TABLET | Freq: Two times a day (BID) | ORAL | Status: DC
  Administered 2023-06-04 – 2023-06-07 (×7): 100 mg via ORAL
  Filled 2023-06-04 (×7): qty 1

## 2023-06-04 MED ORDER — BUSPIRONE HCL 10 MG PO TABS
15.0000 mg | ORAL_TABLET | Freq: Three times a day (TID) | ORAL | Status: DC
  Administered 2023-06-04: 15 mg via ORAL
  Filled 2023-06-04: qty 2

## 2023-06-04 MED ORDER — ARFORMOTEROL TARTRATE 15 MCG/2ML IN NEBU
15.0000 ug | INHALATION_SOLUTION | Freq: Two times a day (BID) | RESPIRATORY_TRACT | Status: DC
  Administered 2023-06-04 – 2023-06-07 (×7): 15 ug via RESPIRATORY_TRACT
  Filled 2023-06-04 (×8): qty 2

## 2023-06-04 MED ORDER — PREDNISONE 20 MG PO TABS: 40.0000 mg | ORAL_TABLET | Freq: Every day | ORAL | Status: DC

## 2023-06-04 MED ORDER — ATORVASTATIN CALCIUM 10 MG PO TABS
10.0000 mg | ORAL_TABLET | Freq: Every day | ORAL | Status: DC
  Administered 2023-06-04 – 2023-06-07 (×4): 10 mg via ORAL
  Filled 2023-06-04 (×4): qty 1

## 2023-06-04 MED ORDER — BUSPIRONE HCL 5 MG PO TABS
15.0000 mg | ORAL_TABLET | Freq: Three times a day (TID) | ORAL | Status: DC
  Administered 2023-06-04 – 2023-06-07 (×10): 15 mg via ORAL
  Filled 2023-06-04: qty 2
  Filled 2023-06-04: qty 3
  Filled 2023-06-04: qty 1
  Filled 2023-06-04 (×4): qty 3
  Filled 2023-06-04: qty 1
  Filled 2023-06-04 (×2): qty 3

## 2023-06-04 MED ORDER — BUSPIRONE HCL 10 MG PO TABS: 10.0000 mg | ORAL_TABLET | Freq: Three times a day (TID) | ORAL | Status: DC

## 2023-06-04 MED ORDER — HYDROCOD POLI-CHLORPHE POLI ER 10-8 MG/5ML PO SUER
5.0000 mL | Freq: Once | ORAL | Status: AC
  Administered 2023-06-04: 5 mL via ORAL
  Filled 2023-06-04: qty 5

## 2023-06-04 MED ORDER — REVEFENACIN 175 MCG/3ML IN SOLN
175.0000 ug | Freq: Every day | RESPIRATORY_TRACT | Status: DC
Start: 2023-06-04 — End: 2023-06-07
  Administered 2023-06-05 – 2023-06-07 (×3): 175 ug via RESPIRATORY_TRACT
  Filled 2023-06-04 (×4): qty 3

## 2023-06-04 MED ORDER — LORATADINE 10 MG PO TABS
10.0000 mg | ORAL_TABLET | Freq: Every day | ORAL | Status: DC
  Administered 2023-06-04 – 2023-06-07 (×4): 10 mg via ORAL
  Filled 2023-06-04 (×4): qty 1

## 2023-06-04 MED ORDER — BENZONATATE 100 MG PO CAPS
100.0000 mg | ORAL_CAPSULE | Freq: Three times a day (TID) | ORAL | Status: DC | PRN
  Administered 2023-06-04: 100 mg via ORAL
  Filled 2023-06-04: qty 1

## 2023-06-04 MED ORDER — ENOXAPARIN SODIUM 40 MG/0.4ML IJ SOSY
40.0000 mg | PREFILLED_SYRINGE | INTRAMUSCULAR | Status: DC
Start: 2023-06-04 — End: 2023-06-07
  Administered 2023-06-04 – 2023-06-07 (×4): 40 mg via SUBCUTANEOUS
  Filled 2023-06-04 (×4): qty 0.4

## 2023-06-04 MED ORDER — DILTIAZEM HCL ER COATED BEADS 120 MG PO CP24
120.0000 mg | ORAL_CAPSULE | Freq: Every day | ORAL | Status: DC
  Administered 2023-06-04 – 2023-06-05 (×2): 120 mg via ORAL
  Filled 2023-06-04 (×2): qty 1

## 2023-06-04 MED ORDER — BUDESONIDE 0.5 MG/2ML IN SUSP
0.5000 mg | Freq: Two times a day (BID) | RESPIRATORY_TRACT | Status: DC
  Administered 2023-06-04 – 2023-06-07 (×7): 0.5 mg via RESPIRATORY_TRACT
  Filled 2023-06-04 (×8): qty 2

## 2023-06-04 MED ORDER — PANTOPRAZOLE SODIUM 40 MG PO TBEC
40.0000 mg | DELAYED_RELEASE_TABLET | Freq: Two times a day (BID) | ORAL | Status: DC
  Administered 2023-06-04 – 2023-06-07 (×7): 40 mg via ORAL
  Filled 2023-06-04 (×7): qty 1

## 2023-06-04 MED ORDER — FERROUS SULFATE 325 (65 FE) MG PO TABS
325.0000 mg | ORAL_TABLET | Freq: Every day | ORAL | Status: DC
  Administered 2023-06-04 – 2023-06-07 (×4): 325 mg via ORAL
  Filled 2023-06-04 (×4): qty 1

## 2023-06-04 MED ORDER — PREDNISONE 20 MG PO TABS
40.0000 mg | ORAL_TABLET | Freq: Every day | ORAL | Status: DC
Start: 2023-06-05 — End: 2023-06-07
  Administered 2023-06-05 – 2023-06-07 (×3): 40 mg via ORAL
  Filled 2023-06-04 (×3): qty 2

## 2023-06-04 MED ORDER — METHYLPREDNISOLONE SODIUM SUCC 40 MG IJ SOLR
40.0000 mg | Freq: Two times a day (BID) | INTRAMUSCULAR | Status: DC
  Filled 2023-06-04: qty 1

## 2023-06-04 MED ORDER — ALPRAZOLAM 0.25 MG PO TABS
0.2500 mg | ORAL_TABLET | Freq: Two times a day (BID) | ORAL | Status: DC | PRN
  Administered 2023-06-04 – 2023-06-07 (×6): 0.25 mg via ORAL
  Filled 2023-06-04 (×7): qty 1

## 2023-06-04 MED ORDER — GABAPENTIN 100 MG PO CAPS
100.0000 mg | ORAL_CAPSULE | Freq: Three times a day (TID) | ORAL | Status: DC
  Administered 2023-06-04 – 2023-06-07 (×10): 100 mg via ORAL
  Filled 2023-06-04 (×11): qty 1

## 2023-06-04 MED ORDER — HYDROXYZINE HCL 25 MG PO TABS
25.0000 mg | ORAL_TABLET | Freq: Four times a day (QID) | ORAL | Status: DC | PRN
  Administered 2023-06-04: 25 mg via ORAL
  Filled 2023-06-04: qty 1

## 2023-06-04 MED ORDER — METHYLPREDNISOLONE SODIUM SUCC 40 MG IJ SOLR
40.0000 mg | Freq: Two times a day (BID) | INTRAMUSCULAR | Status: AC
  Administered 2023-06-04 (×2): 40 mg via INTRAVENOUS
  Filled 2023-06-04 (×2): qty 1

## 2023-06-04 MED ORDER — FLUTICASONE PROPIONATE 50 MCG/ACT NA SUSP
2.0000 | Freq: Every day | NASAL | Status: DC
  Administered 2023-06-05 – 2023-06-07 (×3): 2 via NASAL
  Filled 2023-06-04: qty 16

## 2023-06-04 NOTE — ED Notes (Signed)
Repiratory will come to do nebs

## 2023-06-04 NOTE — Progress Notes (Signed)
RT Note: Pt. seen along with RR RN for >'d shortness of breath, is COVID (+), RT assessment done, found on 4 lpm n/c, place to HFNC(Salter device) at 4l per VBG/Labs reviewed along with double flowmeter setup for prn Albuterol nebulizers, given X1, tolerated well, Flutter ordered/given/started after covering NP spoken with on floor/ordered, pt. tolerated all therapies well, RN on floor made aware.

## 2023-06-04 NOTE — ED Notes (Signed)
.ED TO INPATIENT HANDOFF REPORT  Name/Age/Gender Bianca Murray 67 y.o. female  Code Status    Code Status Orders  (From admission, onward)           Start     Ordered   06/04/23 0023  Full code  Continuous       Question:  By:  Answer:  Consent: discussion documented in EHR   06/04/23 0024           Code Status History     Date Active Date Inactive Code Status Order ID Comments User Context   12/02/2022 1528 12/05/2022 0058 Full Code [id number]  Bobette Mo, MD ED   10/28/2018 0428 11/05/2018 1632 Full Code [id number]  Hillary Bow, DO ED       Home/SNF/Other Nursing Home  Chief Complaint Acute exacerbation of chronic obstructive pulmonary disease (COPD) (HCC) [J44.1]  Level of Care/Admitting Diagnosis ED Disposition     ED Disposition  Admit   Condition  --   Comment  Hospital Area: [facility] [[id number]]  Level of Care: Med-Surg [16]  May admit patient to [facility] or [facility] if equivalent level of care is available:: Yes  Covid Evaluation: Recent COVID positive no isolation required infection day 21-90  Diagnosis: Acute exacerbation of chronic obstructive pulmonary disease (COPD) (HCC) [564332]  Admitting Physician: Rometta Emery [[id number]]  Attending Physician: Rometta Emery [[id number]]  Certification:: I certify this patient will need inpatient services for at least 2 midnights  Expected Medical Readiness: 06/06/2023          Medical History Past Medical History:  Diagnosis Date   Arthritis    Asthma    Cancer (HCC)    Chronic respiratory failure with hypoxia and hypercapnia (HCC)    COPD (chronic obstructive pulmonary disease) (HCC)    Hypertension     Allergies Allergies  Allergen Reactions   Prednisone Anxiety and Other (See Comments)    Made the patient feel very unwell    IV Location/Drains/Wounds Patient Lines/Drains/Airways Status     Active Line/Drains/Airways     Name Placement date  Placement time Site Days   Peripheral IV 06/03/23 22 G Posterior;Right Forearm 06/03/23  2025  Forearm  1   Peripheral IV 06/03/23 20 G 1" Posterior;Proximal;Right Forearm 06/03/23  2158  Forearm  1            Labs/Imaging Results for orders placed or performed during the hospital encounter of 06/03/23 (from the past 48 hour(s))  Basic metabolic panel     Status: Abnormal   Collection Time: 06/03/23  7:39 PM  Result Value Ref Range   Sodium 141 135 - 145 mmol/L   Potassium 3.9 3.5 - 5.1 mmol/L   Chloride 98 98 - 111 mmol/L   CO2 33 (H) 22 - 32 mmol/L   Glucose, Bld 103 (H) 70 - 99 mg/dL    Comment: Glucose reference range applies only to samples taken after fasting for at least 8 hours.   BUN 9 8 - 23 mg/dL   Creatinine, Ser 9.51 0.44 - 1.00 mg/dL   Calcium 8.9 8.9 - 88.4 mg/dL   GFR, Estimated >16 >60 mL/min    Comment: (NOTE) Calculated using the CKD-EPI Creatinine Equation (2021)    Anion gap 10 5 - 15    Comment: Performed at [facility], [address], [place], [place] [address]  CBC with Differential     Status: Abnormal   Collection Time:  06/03/23  7:39 PM  Result Value Ref Range   WBC 4.9 4.0 - 10.5 K/uL   RBC 3.75 (L) 3.87 - 5.11 MIL/uL   Hemoglobin 10.5 (L) 12.0 - 15.0 g/dL   HCT 01.0 (L) 27.2 - 53.6 %   MCV 94.7 80.0 - 100.0 fL   MCH 28.0 26.0 - 34.0 pg   MCHC 29.6 (L) 30.0 - 36.0 g/dL   RDW 64.4 03.4 - 74.2 %   Platelets 339 150 - 400 K/uL   nRBC 0.0 0.0 - 0.2 %   Neutrophils Relative % 51 %   Neutro Abs 2.5 1.7 - 7.7 K/uL   Lymphocytes Relative 29 %   Lymphs Abs 1.4 0.7 - 4.0 K/uL   Monocytes Relative 12 %   Monocytes Absolute 0.6 0.1 - 1.0 K/uL   Eosinophils Relative 8 %   Eosinophils Absolute 0.4 0.0 - 0.5 K/uL   Basophils Relative 0 %   Basophils Absolute 0.0 0.0 - 0.1 K/uL   Immature Granulocytes 0 %   Abs Immature Granulocytes 0.02 0.00 - 0.07 K/uL    Comment: Performed at [facility], [address] [place], [place] [address]  Brain natriuretic peptide     Status: None   Collection Time: 06/03/23  7:40 PM  Result Value Ref Range   B Natriuretic Peptide 29.0 0.0 - 100.0 pg/mL    Comment: Performed at [facility], [address], [place], [place] [address]  Resp panel by RT-PCR (RSV, Flu A&B, Covid) Anterior Nasal Swab     Status: Abnormal   Collection Time: 06/04/23 12:40 AM   Specimen: Anterior Nasal Swab  Result Value Ref Range   SARS Coronavirus 2 by RT PCR POSITIVE (A) NEGATIVE    Comment: (NOTE) SARS-CoV-2 target nucleic acids are DETECTED.  The SARS-CoV-2 RNA is generally detectable in upper respiratory specimens during the acute phase of infection. Positive results are indicative of the presence of the identified virus, but do not rule out bacterial infection or co-infection with other pathogens not detected by the test. Clinical correlation with patient history and other diagnostic information is necessary to determine patient infection status. The expected result is Negative.  Fact Sheet for Patients: BloggerCourse.com  Fact Sheet for Healthcare Providers: SeriousBroker.it  This test is not yet approved or cleared by the [place] FDA and  has been authorized for detection and/or diagnosis of SARS-CoV-2 by FDA under an Emergency Use Authorization (EUA).  This EUA will remain in effect (meaning this test can be used) for the duration of  the COVID-19 declaration under Section 564(b)(1) of the A ct, 21 U.S.C. section 360bbb-3(b)(1), unless the authorization is terminated or revoked sooner.     Influenza A by PCR NEGATIVE NEGATIVE   Influenza B by PCR NEGATIVE NEGATIVE    Comment: (NOTE) The Xpert Xpress SARS-CoV-2/FLU/RSV plus assay is intended as an aid in the diagnosis of influenza from Nasopharyngeal swab specimens and should not be used as a sole basis for treatment. Nasal washings  and aspirates are unacceptable for Xpert Xpress SARS-CoV-2/FLU/RSV testing.  Fact Sheet for Patients: BloggerCourse.com  Fact Sheet for Healthcare Providers: SeriousBroker.it  This test is not yet approved or cleared by the [place] FDA and has been authorized for detection and/or diagnosis of SARS-CoV-2 by FDA under an Emergency Use Authorization (EUA). This EUA will remain in effect (meaning this test can be used) for the duration of the COVID-19 declaration under Section 564(b)(1) of the Act, 21  U.S.C. section 360bbb-3(b)(1), unless the authorization is terminated or revoked.     Resp Syncytial Virus by PCR NEGATIVE NEGATIVE    Comment: (NOTE) Fact Sheet for Patients: BloggerCourse.com  Fact Sheet for Healthcare Providers: SeriousBroker.it  This test is not yet approved or cleared by the [place] FDA and has been authorized for detection and/or diagnosis of SARS-CoV-2 by FDA under an Emergency Use Authorization (EUA). This EUA will remain in effect (meaning this test can be used) for the duration of the COVID-19 declaration under Section 564(b)(1) of the Act, 21 U.S.C. section 360bbb-3(b)(1), unless the authorization is terminated or revoked.  Performed at [facility], [address], [place], [place] [address]   HIV Antibody (routine testing w rflx)     Status: None   Collection Time: 06/04/23  6:44 AM  Result Value Ref Range   HIV Screen 4th Generation wRfx Non Reactive Non Reactive    Comment: Performed at [facility], [address], [place], [place] [address]  Comprehensive metabolic panel     Status: Abnormal   Collection Time: 06/04/23  6:44 AM  Result Value Ref Range   Sodium 139 135 - 145 mmol/L   Potassium 4.2 3.5 - 5.1 mmol/L   Chloride 97 (L) 98 - 111 mmol/L   CO2 33 (H) 22 - 32 mmol/L   Glucose, Bld 162 (H) 70 - 99  mg/dL    Comment: Glucose reference range applies only to samples taken after fasting for at least 8 hours.   BUN 9 8 - 23 mg/dL   Creatinine, Ser 0.98 0.44 - 1.00 mg/dL   Calcium 9.3 8.9 - 11.9 mg/dL   Total Protein 7.0 6.5 - 8.1 g/dL   Albumin 3.4 (L) 3.5 - 5.0 g/dL   AST 12 (L) 15 - 41 U/L   ALT 12 0 - 44 U/L   Alkaline Phosphatase 59 38 - 126 U/L   Total Bilirubin 0.4 0.3 - 1.2 mg/dL   GFR, Estimated >14 >78 mL/min    Comment: (NOTE) Calculated using the CKD-EPI Creatinine Equation (2021)    Anion gap 9 5 - 15    Comment: Performed at [facility], [address] [place], [place] [address]  Blood gas, venous     Status: Abnormal   Collection Time: 06/04/23  1:00 PM  Result Value Ref Range   pH, Ven 7.4 7.25 - 7.43   pCO2, Ven 57 44 - 60 mmHg   pO2, Ven 66 (H) 32 - 45 mmHg   Bicarbonate 35.3 (H) 20.0 - 28.0 mmol/L   Acid-Base Excess 8.5 (H) 0.0 - 2.0 mmol/L   O2 Saturation 94.9 %   Patient temperature 37.0     Comment: Performed at [facility], [address] [place], [place] [address]   CT Angio Chest PE W and/or Wo Contrast  Result Date: 06/04/2023 CLINICAL DATA:  Pulmonary embolism suspected, high probability. Progressively worsening shortness of breath. Positive COVID test on 05/29/2023. History of COPD. EXAM: CT ANGIOGRAPHY CHEST WITH CONTRAST TECHNIQUE: Multidetector CT imaging of the chest was performed using the standard protocol during bolus administration of intravenous contrast. Multiplanar CT image reconstructions and MIPs were obtained to evaluate the vascular anatomy. RADIATION DOSE REDUCTION: This exam was performed according to the departmental dose-optimization program which includes automated exposure control, adjustment of the mA and/or kV according to patient size and/or use of iterative reconstruction technique. CONTRAST:  75mL OMNIPAQUE IOHEXOL 350 MG/ML SOLN COMPARISON:  None Available. FINDINGS: Cardiovascular: The  heart is normal in size and there is no pericardial effusion. There is atherosclerotic calcification of the aorta without evidence of aneurysm. The pulmonary trunk is normal in caliber. No evidence of pulmonary embolism. Mediastinum/Nodes: No mediastinal, hilar, or axillary lymphadenopathy. The thyroid gland and esophagus are within normal limits. Small hiatal hernia is noted. There is collapse of the mid trachea, which may be associated with tracheomalacia. Lungs/Pleura: Emphysematous changes are present in the lungs. Bronchial wall thickening is noted bilaterally, most pronounced in the right lower lobe. Strandy airspace disease is noted in the lingular segment of the left upper lobe, right middle lobe, and lower lobes bilaterally. No effusion or pneumothorax. Upper Abdomen: No acute abnormality. Musculoskeletal: No acute osseous abnormality. Review of the MIP images confirms the above findings. IMPRESSION: 1. No evidence of pulmonary embolism. 2. Bronchial wall thickening bilaterally with strandy opacities at the lung bases, possible atelectasis or infiltrate. 3. Emphysema. 4. Aortic atherosclerosis. Electronically Signed   By: Thornell Sartorius M.D.   On: 06/04/2023 00:07   DG Chest Port 1 View  Result Date: 06/03/2023 CLINICAL DATA:  Shortness of breath, tested positive for COVID on 10/16. EXAM: PORTABLE CHEST 1 VIEW COMPARISON:  04/18/2023. FINDINGS: The heart size and mediastinal contours are within normal limits. There is atherosclerotic calcification of the aorta. Subsegmental atelectasis or scarring is noted in the mid lungs bilaterally. No consolidation, effusion, or pneumothorax. No acute osseous abnormality. IMPRESSION: Stable chest with no active disease. Electronically Signed   By: Thornell Sartorius M.D.   On: 06/03/2023 22:20    Pending Labs Unresulted Labs (From admission, onward)     Start     Ordered   06/11/23 0500  Creatinine, serum  (enoxaparin (LOVENOX)    CrCl >/= 30 ml/min)  Weekly,   R      Comments: while on enoxaparin therapy    06/04/23 0622   06/05/23 0500  CBC  Daily,   R      06/04/23 1220   06/05/23 0500  Basic metabolic panel  Daily,   R      06/04/23 1220            Vitals/Pain Today's Vitals   06/04/23 1800 06/04/23 1815 06/04/23 1830 06/04/23 1845  BP: (!) 138/99 (!) 150/96 (!) 143/96 (!) 154/93  Pulse: (!) 106 (!) 114 (!) 107 (!) 114  Resp: (!) 25 (!) 22 (!) 22 (!) 23  Temp:      TempSrc:      SpO2: 98% 95% 98% 93%  PainSc:        Isolation Precautions No active isolations  Medications Medications  atorvastatin (LIPITOR) tablet 10 mg (10 mg Oral Given 06/04/23 0932)  diltiazem (CARDIZEM CD) 24 hr capsule 120 mg (120 mg Oral Given 06/04/23 0930)  sertraline (ZOLOFT) tablet 150 mg (150 mg Oral Given 06/04/23 0929)  pantoprazole (PROTONIX) EC tablet 40 mg (40 mg Oral Given 06/04/23 1659)  benzonatate (TESSALON) capsule 100 mg (has no administration in time range)  fluticasone (FLONASE) 50 MCG/ACT nasal spray 2 spray (2 sprays Each Nare Patient Refused/Not Given 06/04/23 1047)  enoxaparin (LOVENOX) injection 40 mg (40 mg Subcutaneous Given 06/04/23 0930)  doxycycline (VIBRA-TABS) tablet 100 mg (100 mg Oral Given 06/04/23 0930)  albuterol (PROVENTIL) (2.5 MG/3ML) 0.083% nebulizer solution 2.5 mg (2.5 mg Nebulization Given 06/04/23 0631)  methylPREDNISolone sodium succinate (SOLU-MEDROL) 40 mg/mL injection 40 mg (40 mg Intravenous Given 06/04/23 0631)    Followed by  predniSONE (  DELTASONE) tablet 40 mg (has no administration in time range)  budesonide (PULMICORT) nebulizer solution 0.5 mg (0.5 mg Nebulization Given 06/04/23 1342)  busPIRone (BUSPAR) tablet 15 mg (15 mg Oral Given 06/04/23 1703)  loratadine (CLARITIN) tablet 10 mg (10 mg Oral Given 06/04/23 1259)  ferrous sulfate tablet 325 mg (325 mg Oral Given 06/04/23 1259)  arformoterol (BROVANA) nebulizer solution 15 mcg (15 mcg Nebulization Given 06/04/23 1342)  gabapentin (NEURONTIN)  capsule 100 mg (100 mg Oral Given 06/04/23 1659)  traMADol (ULTRAM) tablet 50 mg (has no administration in time range)  revefenacin (YUPELRI) nebulizer solution 175 mcg (175 mcg Nebulization Not Given 06/04/23 1655)  ALPRAZolam (XANAX) tablet 0.25 mg (has no administration in time range)  ipratropium-albuterol (DUONEB) 0.5-2.5 (3) MG/3ML nebulizer solution 3 mL (3 mLs Nebulization Given 06/03/23 1954)  methylPREDNISolone sodium succinate (SOLU-MEDROL) 125 mg/2 mL injection 125 mg (125 mg Intravenous Given 06/03/23 2027)  iohexol (OMNIPAQUE) 350 MG/ML injection 75 mL (75 mLs Intravenous Contrast Given 06/03/23 2114)  albuterol (PROVENTIL) (2.5 MG/3ML) 0.083% nebulizer solution 5 mg (5 mg Nebulization Given 06/04/23 0009)    Mobility walks with person assist

## 2023-06-04 NOTE — Progress Notes (Signed)
Patient seen and examined personally, I reviewed the chart, history and physical and admission note, done by admitting physician this morning and agree with the same with following addendum.  Please refer to the morning admission note for more detailed plan of care.  Briefly,  67 year old female with history of COPD chronic hypoxic respiratory failure on 4L [place], anxiety, arthritis, history of tracheostomy, HLD, HTN, GERD ambulatory dysfunction who had recent COVID positive at a SNF on 05/29/2023 and having shortness of breath coughing has been sent to the ED with progressively worsening symptoms with shortness of breath. In the ED oxygen saturation stable on 4 L home setting, afebrile at times tachycardic Labs shows chronic anemia hemoglobin 10.5 g stable renal function LFTs COVID-positive again, influenza negative Chest x-ray stable. CT angio chest>> no PE, bronchial wall thickening bilaterally with possible atelectasis or infiltrate at the base, emphysema and aortic atherosclerosis noticed Patient was admitted for further management   On exam patient is coughing with deep breath Bilateral diminished breath sounds, no chest pain no nausea vomiting fever chills Patient's daughter updated over the phone.  A/p Acute COPD exacerbation in the setting of COVID infection: continue Solu-Medrol, doxycycline, bronchodilators, supplemental oxygen.  COVID-positive 10/16: Symptomatic with shortness of breath but no pneumonia.  Already on steroid for #1, continue bronchodilators, out of window for antiviral.  GERD HLD HTN GAD Ambulatory dysfunction -See HPI

## 2023-06-04 NOTE — Progress Notes (Signed)
Rapid Response Event Note   Reason for Call : Pt screaming and yelling "I can't breathe"   Initial Focused Assessment: Pt A/O and able to follow commands.  Pt sitting up in bed very anxious.  Sats 99% on 4 L [place].  Lung sounds diminished with wheezes.  Pt denies pain but something to help her cough up her mucus.  Pt feeling as though she is coughing but not effectively.  Pt seems to have these episodes triggered with movement.  Previous notes suggest pt became anxious after a bowel movement and became SOB but calmed down after a few minutes.    Interventions: TRIAD, NP at bedside, new orders received and initiated per primary nurse.   Plan of Care: Pt will remain in current location for now but please call if assistance is needed.  Pt will remain bed and rest as well.    Event Summary:   MD Notified: yes Call Time: 2135 Arrival [id number] End Time: 2215  Conley Rolls, RN

## 2023-06-04 NOTE — ED Notes (Signed)
Pt states she does not ambulate. Pt states she uses a walker at home but that she can't use one now.

## 2023-06-04 NOTE — Progress Notes (Signed)
OT Cancellation Note  Patient Details Name: Bianca Murray MRN: [id number] DOB: May 26, 1956   Cancelled Treatment:    Reason Eval/Treat Not Completed: Medical issues which prohibited therapy OT to continue to follow and check back as schedule will allow.  Rosalio Loud, MS Acute Rehabilitation Department Office# [phone]  06/04/2023, 1:53 PM

## 2023-06-04 NOTE — Hospital Course (Addendum)
67 year old female with history of COPD chronic hypoxic respiratory failure on 4L [place], anxiety, arthritis, history of tracheostomy, HLD, HTN, GERD ambulatory dysfunction who had recent COVID positive at a SNF on 05/29/2023 and having shortness of breath coughing has been sent to the ED with progressively worsening symptoms with shortness of breath. Patient stated symptoms onset was on 10/12-10/13.  In the ED,oxygen saturation stable on 4 L [place] home setting, afebrile at times tachycardic Labs shows chronic anemia hb-10.5 g stable renal function LFTs COVID-positive again, influenza negative.Chest x-ray stable. CT angio chest>>no PE, bronchial wall thickening bilaterally with strandy opacities at lung bases-possible atelectasis or infiltrate at the base, emphysema and aortic atherosclerosis noticed Patient was admitted for further management of copd exacerbation in the setting of COVID. Patient was managed for acute exacerbation of COPD and she has clinically improved she will be discharged on oral steroid taper.

## 2023-06-04 NOTE — ED Notes (Signed)
Patient became very anxious and SOB directly after having a bowel movement    patient was talked down to slow her breathing down    after a few minutes sh began to relax

## 2023-06-04 NOTE — ED Notes (Signed)
Pt assessed at beginning of shift. Vitals assessed. Medications given. One IV not in place upon assessment. Pt call bell within reach and bed rails in place.

## 2023-06-04 NOTE — Progress Notes (Signed)
PT Cancellation Note  Patient Details Name: Bianca Murray MRN: [id number] DOB: [birth year]   Cancelled Treatment:    Reason Eval/Treat Not Completed: Medical issues which prohibited therapy, patient having respiratory distress. Will check back tomorrow.  Blanchard Kelch PT Acute Rehabilitation Services Office [phone] Weekend pager-[phone]     Rada Hay 06/04/2023, 2:13 PM

## 2023-06-04 NOTE — Progress Notes (Signed)
   06/04/23 2240  Chest Physiotherapy Tx  CPT Delivery Source Flutter valve  $ Flutter Blue Yes  CPT Duration 7  CPT Chest Site Full range  Post-Treatment Respirations 16  Cough Productive  Sputum Amount Moderate  Sputum Color Green;Tan  Position Sitting (BED)  CPT Treatment Tolerance Tolerated well

## 2023-06-04 NOTE — H&P (Signed)
History and Physical    Patient: Bianca Murray GUY:[id number] DOB: [birth year] DOA: 06/03/2023 DOS: the patient was seen and examined on 06/04/2023 PCP: Patient, No Pcp Per  Patient coming from: Home  Chief Complaint:  Chief Complaint  Patient presents with   Shortness of Breath   HPI: Bianca Murray is a 67 y.o. female with medical history significant of COPD with chronic respiratory failure on home oxygen, history of tracheostomy, hyperlipidemia, essential hypertension, GERD, who was apparently diagnosed with COVID last week not sure what she was treated with.  Patient came to the ER today because of worsening shortness of breath cough or wheezing.  Workup in the ER including chest x-ray and CT showed no evidence of pneumonia.  No other abnormal findings.  Low pulmonary embolism.  Patient appears to have acute exacerbation of COPD probably triggered by her acute viral infection last week.  Respiratory panel has not been reported today.  Patient has not been hypoxic at rest.  She has been tachycardic with activity.  Also mildly hypoxic above her baseline with activity.  Patient treated in the ER but still symptomatic patient will therefore be admitted for further evaluation and treatment.  Review of Systems: As mentioned in the history of present illness. All other systems reviewed and are negative. Past Medical History:  Diagnosis Date   Arthritis    Asthma    Cancer (HCC)    Chronic respiratory failure with hypoxia and hypercapnia (HCC)    COPD (chronic obstructive pulmonary disease) (HCC)    Hypertension    Past Surgical History:  Procedure Laterality Date   ABDOMINAL HYSTERECTOMY     TRACHEOSTOMY     reversed   TRACHEOSTOMY CLOSURE     Social History:  reports that she has quit smoking. She has never used smokeless tobacco. She reports that she does not currently use alcohol. She reports that she does not currently use drugs.  Allergies  Allergen Reactions   Prednisone Anxiety  and Other (See Comments)    Made the patient feel very unwell    Family History  Problem Relation Age of Onset   Hypertension Mother    Heart disease Mother    Cancer Mother    Asthma Father    Asthma Sister    Hypertension Sister    Asthma Brother    Cancer Brother     Prior to Admission medications   Medication Sig Start Date End Date Taking? Authorizing Provider  acetaminophen (TYLENOL) 500 MG tablet Take 1,000 mg by mouth 3 (three) times daily.    [provider]  albuterol (PROVENTIL) (2.5 MG/3ML) 0.083% nebulizer solution USE ONE VIAL (2.5 MG TOTAL) BY NEBULIZATION 4  TIMES DAILY as needed. Patient taking differently: Take 2.5 mg by nebulization every 6 (six) hours as needed for wheezing or shortness of breath. 12/04/22   Azucena Fallen, MD  albuterol (VENTOLIN HFA) 108 (90 Base) MCG/ACT inhaler Inhale 2 puffs into the lungs every 6 (six) hours as needed for wheezing or shortness of breath. 12/04/22   Azucena Fallen, MD  atorvastatin (LIPITOR) 10 MG tablet Take 1 tablet (10 mg total) by mouth daily. 09/20/22   Storm Frisk, MD  benzonatate (TESSALON) 100 MG capsule Take 1 capsule (100 mg total) by mouth 3 (three) times daily as needed for cough. Patient taking differently: Take 100 mg by mouth every 8 (eight) hours as needed for cough. 03/21/23   Storm Frisk, MD  budesonide (PULMICORT) 0.5 MG/2ML nebulizer solution Take  2 mLs (0.5 mg total) by nebulization 2 (two) times daily. 11/22/22 04/18/23  Storm Frisk, MD  busPIRone (BUSPAR) 10 MG tablet Take 1 tablet (10 mg total) by mouth 2 (two) times daily. Patient taking differently: Take 10 mg by mouth 3 (three) times daily. 12/04/22   Azucena Fallen, MD  cetirizine (ZYRTEC) 10 MG tablet Take 10 mg by mouth daily.    [provider]  cyclobenzaprine (FLEXERIL) 10 MG tablet TAKE 1 TABLET (10 MG TOTAL) BY MOUTH 3 (THREE) TIMES DAILY AS NEEDED FOR MUSCLE SPASMS. Patient taking differently: Take  10 mg by mouth every 8 (eight) hours as needed for muscle spasms. 11/29/22   Storm Frisk, MD  diltiazem (CARDIZEM CD) 120 MG 24 hr capsule Take 1 capsule (120 mg total) by mouth daily. 12/05/22   Azucena Fallen, MD  docusate sodium (COLACE) 100 MG capsule Take 100 mg by mouth 2 (two) times daily as needed for mild constipation.    [provider]  ferrous sulfate 325 (65 FE) MG tablet Take 325 mg by mouth daily.    [provider]  fluticasone (FLONASE) 50 MCG/ACT nasal spray Place 2 sprays into both nostrils daily. 12/04/22   Azucena Fallen, MD  formoterol (PERFOROMIST) 20 MCG/2ML nebulizer solution Take 2 mLs (20 mcg total) by nebulization 2 (two) times daily. 11/22/22   Storm Frisk, MD  gabapentin (NEURONTIN) 100 MG capsule Take 1 capsule (100 mg total) by mouth 3 (three) times daily. 09/20/22   Storm Frisk, MD  guaiFENesin (ROBITUSSIN) 100 MG/5ML liquid Take 5 mLs by mouth every 6 (six) hours as needed for cough.    [provider]  hydrOXYzine (ATARAX) 25 MG tablet TAKE 1 TABLET (25 MG TOTAL) BY MOUTH 3 (THREE) TIMES DAILY AS NEEDED FOR ANXIETY. Patient taking differently: Take 25 mg by mouth every 6 (six) hours as needed for anxiety. 11/29/22   Storm Frisk, MD  ipratropium-albuterol (DUONEB) 0.5-2.5 (3) MG/3ML SOLN Take 3 mLs by nebulization 4 (four) times daily.    [provider]  nystatin powder Apply 1 Application topically every 8 (eight) hours as needed (wound care). Apply under breast    [provider]  OXYGEN Inhale 4-5 L/min into the lungs continuous. 01/19/22   [provider]  pantoprazole (PROTONIX) 40 MG tablet TAKE 1 TABLET BY MOUTH 2 TIMES DAILY BEFORE A MEAL (AM+EVENING) Patient taking differently: Take 40 mg by mouth 2 (two) times daily before a meal. 11/29/22   Storm Frisk, MD  Polyethyl Glycol-Propyl Glycol (SYSTANE) 0.4-0.3 % SOLN Place 1 drop into the right eye in the morning, at noon, in  the evening, and at bedtime.    [provider]  Sertraline HCl 150 MG CAPS Take 150 mg by mouth daily.    [provider]  sodium chloride (OCEAN) 0.65 % SOLN nasal spray Place 2 sprays into both nostrils as needed for congestion. 12/04/22   Azucena Fallen, MD  Vitamin D, Ergocalciferol, (DRISDOL) 1.25 MG (50000 UNIT) CAPS capsule Take 1 capsule (50,000 Units total) by mouth every 7 (seven) days. Patient taking differently: Take 50,000 Units by mouth every Monday. 09/20/22   Storm Frisk, MD  YUPELRI 175 MCG/3ML nebulizer solution Take 3 mLs (175 mcg total) by nebulization daily. 11/22/22   Storm Frisk, MD    Physical Exam: Vitals:   06/03/23 2023 06/03/23 2255 06/03/23 2310 06/04/23 0009  BP:   116/79   Pulse:  91 89   Resp:  16 17   Temp: 98.5 F (36.9 C)   98 F (36.7 C)  TempSrc: Oral   Oral  SpO2:  99% 99%    Constitutional: NAD, calm, comfortable Eyes: PERRL, lids and conjunctivae normal ENMT: Mucous membranes are moist. Posterior pharynx clear of any exudate or lesions.Normal dentition.  Neck: normal, supple, no masses, no thyromegaly Respiratory: Coarse breath sound mild expiratory wheezing bilaterally, no crackles. Normal respiratory effort. No accessory muscle use.  Cardiovascular: Regular rate and rhythm, no murmurs / rubs / gallops. No extremity edema. 2+ pedal pulses. No carotid bruits.  Abdomen: no tenderness, no masses palpated. No hepatosplenomegaly. Bowel sounds positive.  Musculoskeletal: Good range of motion, no joint swelling or tenderness, Skin: no rashes, lesions, ulcers. No induration Neurologic: CN 2-12 grossly intact. Sensation intact, DTR normal. Strength 5/5 in all 4.  Psychiatric: Normal judgment and insight. Alert and oriented x 3. Normal mood  Data Reviewed:  CO2 33, glucose 103, hemoglobin 10.5 chest x-ray shows stable no active disease.  CT angiogram of the chest shows no PE.  Evidence of emphysema.  Assessment and  Plan:  #1 acute exacerbation of of COPD: Symptoms probably triggered by recent viral infection.  Admit the patient.  IV Solu-Medrol, DuoNebs, antibiotics.  #2 reported COVID-19 infection: We will retest patient.  We have no confirmed diagnosis from outside.  #3 essential hypertension: Confirm on resume home regimen.  #4 GERD: Continue PPIs  #5 hyperlipidemia: Continue statin  #6 generalized anxiety disorder: Continue home regimen including BuSpar.    Advance Care Planning:   Code Status: Full Code   Consults: None  Family Communication: No family at bedside  Severity of Illness: The appropriate patient status for this patient is OBSERVATION. Observation status is judged to be reasonable and necessary in order to provide the required intensity of service to ensure the patient's safety. The patient's presenting symptoms, physical exam findings, and initial radiographic and laboratory data in the context of their medical condition is felt to place them at decreased risk for further clinical deterioration. Furthermore, it is anticipated that the patient will be medically stable for discharge from the hospital within 2 midnights of admission.   AuthorLonia Blood, MD 06/04/2023 12:25 AM  For on call review [email or web address].

## 2023-06-05 DIAGNOSIS — J441 Chronic obstructive pulmonary disease with (acute) exacerbation: Secondary | ICD-10-CM | POA: Diagnosis not present

## 2023-06-05 LAB — CBC
HCT: 36.5 % (ref 36.0–46.0)
Hemoglobin: 10.9 g/dL — ABNORMAL LOW (ref 12.0–15.0)
MCH: 27.9 pg (ref 26.0–34.0)
MCHC: 29.9 g/dL — ABNORMAL LOW (ref 30.0–36.0)
MCV: 93.6 fL (ref 80.0–100.0)
Platelets: 417 10*3/uL — ABNORMAL HIGH (ref 150–400)
RBC: 3.9 MIL/uL (ref 3.87–5.11)
RDW: 14.5 % (ref 11.5–15.5)
WBC: 11.3 10*3/uL — ABNORMAL HIGH (ref 4.0–10.5)
nRBC: 0 % (ref 0.0–0.2)

## 2023-06-05 LAB — BASIC METABOLIC PANEL
Anion gap: 9 (ref 5–15)
BUN: 15 mg/dL (ref 8–23)
CO2: 32 mmol/L (ref 22–32)
Calcium: 9.3 mg/dL (ref 8.9–10.3)
Chloride: 98 mmol/L (ref 98–111)
Creatinine, Ser: 0.72 mg/dL (ref 0.44–1.00)
GFR, Estimated: >60 mL/min (ref >60)
Glucose, Bld: 137 mg/dL — ABNORMAL HIGH (ref 70–99)
Potassium: 4.8 mmol/L (ref 3.5–5.1)
Sodium: 139 mmol/L (ref 135–145)

## 2023-06-05 MED ORDER — HYDROCOD POLI-CHLORPHE POLI ER 10-8 MG/5ML PO SUER: 5.0000 mL | Freq: Three times a day (TID) | ORAL | Status: DC | PRN

## 2023-06-05 MED ORDER — HYDROCOD POLI-CHLORPHE POLI ER 10-8 MG/5ML PO SUER
5.0000 mL | Freq: Two times a day (BID) | ORAL | Status: DC | PRN
  Administered 2023-06-05 – 2023-06-06 (×3): 5 mL via ORAL
  Filled 2023-06-05 (×3): qty 5

## 2023-06-05 MED ORDER — ENSURE ENLIVE PO LIQD
237.0000 mL | Freq: Three times a day (TID) | ORAL | Status: DC
  Administered 2023-06-05: 237 mL via ORAL

## 2023-06-05 MED ORDER — DILTIAZEM HCL ER COATED BEADS 180 MG PO CP24
180.0000 mg | ORAL_CAPSULE | Freq: Every day | ORAL | Status: DC
  Administered 2023-06-06 – 2023-06-07 (×2): 180 mg via ORAL
  Filled 2023-06-05 (×2): qty 1

## 2023-06-05 MED ORDER — ADULT MULTIVITAMIN W/MINERALS CH
1.0000 | ORAL_TABLET | Freq: Every day | ORAL | Status: DC
  Administered 2023-06-06 – 2023-06-07 (×2): 1 via ORAL
  Filled 2023-06-05 (×2): qty 1

## 2023-06-05 NOTE — Evaluation (Signed)
Occupational Therapy Evaluation Patient Details Name: Bianca Murray MRN: [id number] DOB: 04-03-1956 Today's Date: 06/05/2023   History of Present Illness Pt is a 67 yr old female admitted on 06/03/23. Pt was diagnosed with COVID last week. Pt presented to the ED with c/o worsening SOB, cough, wheezing. Pt is being treated for acute exacerbation of COPD likely triggered by recent viral infection. PMH: COPD, chronic respiratory failure on home O2, tracheostomy, HLD, essential HTN, GERD, asthma, CA   Clinical Impression   The pt is currently presenting with the below listed deficits, which compromise her ADL performance (see OT problem list).  She requires assist for tasks, including lower body dressing, toileting, sit to stand using a RW, and marching in place using a RW. She was also noted to be with intermittent BUE tremors, she reports to be chronic in nature, as well as compromised endurance, shortness of breath with activity (she reported use of 3-4L O2 all the time at her baseline), unsteadiness in standing, and reports of a recent L wrist fracture (wrist in cast). She will benefit from OT services to maximize her ADL performance and to decrease the risk for further weakness and deconditioning. Patient will benefit from continued inpatient follow up therapy, <3 hours/day.       If plan is discharge home, recommend the following: A lot of help with bathing/dressing/bathroom;Direct supervision/assist for medications management;A lot of help with walking and/or transfers    Functional Status Assessment  Patient has had a recent decline in their functional status and demonstrates the ability to make significant improvements in function in a reasonable and predictable amount of time.  Equipment Recommendations  None recommended by OT    Recommendations for Other Services       Precautions / Restrictions Precautions Precautions: Fall Restrictions Weight Bearing Restrictions: Yes LUE  Weight Bearing: Non weight bearing Other Position/Activity Restrictions: pt with cast on L wrist, per chart, pt NWB as of 05/16/23 with follow up scheduled in 3 weeks      Mobility Bed Mobility    General bed mobility comments: pt was received seated in the bedside chair    Transfers Overall transfer level: Needs assistance Equipment used: Rolling walker (2 wheels) Transfers: Sit to/from Stand Sit to Stand: Contact guard assist                  Balance     Sitting balance-Leahy Scale: Good         Standing balance comment: Min assist with RW for marching in place            ADL either performed or assessed with clinical judgement   ADL Overall ADL's : Needs assistance/impaired Eating/Feeding: Set up;Sitting Eating/Feeding Details (indicate cue type and reason): Pt presents with intermittent BUE tremors, which she reports to be chronic in nature. As such, she requires occasional set-up assist for feeding. Grooming: Set up;Sitting           Upper Body Dressing : Set up;Sitting   Lower Body Dressing: Minimal assistance;Sitting/lateral leans       Toileting- Clothing Manipulation and Hygiene: Moderate assistance;Sit to/from stand Toileting - Clothing Manipulation Details (indicate cue type and reason): based on clinical judgement              Pertinent Vitals/Pain Pain Assessment Pain Assessment: 0-10 Pain Score: 8  Pain Location: L wrist Pain Intervention(s): Patient requesting pain meds-RN notified     Extremity/Trunk Assessment Upper Extremity Assessment Upper Extremity Assessment: Right hand dominant;RUE  deficits/detail RUE Deficits / Details: AROM and grip strength WFL LUE Deficits / Details: L forearm/wrist in cast   Lower Extremity Assessment Lower Extremity Assessment: Generalized weakness       Communication Communication Communication: No apparent difficulties   Cognition Arousal: Alert Behavior During Therapy: WFL for tasks  assessed/performed Overall Cognitive Status: Within Functional Limits for tasks assessed              General Comments              Home Living Family/patient expects to be discharged to:: Skilled nursing facility        Additional Comments: Pt reports she was previosuly at [facility] for rehab but is now a resident there.      Prior Functioning/Environment Prior Level of Function : Needs assist             Mobility Comments: Pt reports a couple falls in the past & because of this has not been walking as much. Pt reports she has been completing stand pivot transfers into and out of electric wheelchair without assistance. ADLs Comments: Required assist from staff for lower body dressing, bathing, and toileting.  She used 3-4L O2 all the time at her baseline.         OT Problem List: Decreased strength;Decreased activity tolerance;Impaired balance (sitting and/or standing);Decreased coordination;Cardiopulmonary status limiting activity;Pain;Impaired UE functional use      OT Treatment/Interventions: Self-care/ADL training;Therapeutic exercise;Energy conservation;DME and/or AE instruction;Therapeutic activities;Patient/family education;Balance training    OT Goals(Current goals can be found in the care plan section) Acute Rehab OT Goals Patient Stated Goal: to be as independent as possible OT Goal Formulation: With patient Time For Goal Achievement: 06/19/23 Potential to Achieve Goals: Good ADL Goals Pt Will Perform Lower Body Dressing: with contact guard assist;sit to/from stand;sitting/lateral leans Pt Will Transfer to Toilet: with contact guard assist;bedside commode;stand pivot transfer Pt Will Perform Toileting - Clothing Manipulation and hygiene: with contact guard assist;sit to/from stand Additional ADL Goal #1: The pt will perform bed mobility and sit to stand with CGA, in prep for progressive ADL participation.  OT Frequency: Min 1X/week       AM-PAC OT  "6 Clicks" Daily Activity     Outcome Measure Help from another person eating meals?: A Little Help from another person taking care of personal grooming?: A Little Help from another person toileting, which includes using toliet, bedpan, or urinal?: A Lot Help from another person bathing (including washing, rinsing, drying)?: A Lot Help from another person to put on and taking off regular upper body clothing?: A Little Help from another person to put on and taking off regular lower body clothing?: A Lot 6 Click Score: 15   End of Session Equipment Utilized During Treatment: Rolling walker (2 wheels);Oxygen Nurse Communication: Patient requests pain meds  Activity Tolerance: Other (comment) (fair tolerance; limited by compromised endurance and shortness of breath with activity) Patient left: in chair;with call bell/phone within reach;with chair alarm set  OT Visit Diagnosis: Unsteadiness on feet (R26.81);Muscle weakness (generalized) (M62.81);Pain Pain - Right/Left: Left Pain - part of body: Arm                Time: 1125-1145 OT Time Calculation (min): 20 min Charges:  OT General Charges $OT Visit: 1 Visit OT Evaluation $OT Eval Moderate Complexity: 1 Mod    Bianca Murray, OTR/L 06/05/2023, 1:56 PM

## 2023-06-05 NOTE — Progress Notes (Signed)
Initial Nutrition Assessment  DOCUMENTATION CODES:   Obesity unspecified  INTERVENTION:   Ensure Enlive po TID, each supplement provides 350 kcal and 20 grams of protein.  MVI po daily   Pt at high refeed risk; recommend monitor potassium, magnesium and phosphorus labs daily until stable  Daily weights  NUTRITION DIAGNOSIS:   Increased nutrient needs related to catabolic illness (COPD, COVID 19) as evidenced by estimated needs.  GOAL:   Patient will meet greater than or equal to 90% of their needs  MONITOR:   PO intake, Supplement acceptance, Labs, Weight trends, Skin, I & O's  REASON FOR ASSESSMENT:   Consult Assessment of nutrition requirement/status  ASSESSMENT:   67 y/o female with h/o COPD, HTN, GERD, previous tracheostomy, incarcerated ventral hernia, anxiety and HLD who is admitted with COVID 19 and COPD exacerbation.  RD working remotely.  RD unable to reach pt by phone after multiple attempts. Per chart, pt with COVID 19 first diagnosed on 10/16. RD suspects pt with decreased oral intake for the past week. There are no documented intakes from this admission. Pt with increased estimated needs. RD will supplements and MVI to help pt meet her estimated needs. Pt is at high refeed risk. Per chart, pt appears weight stable at baseline but there is no new weight from this admission. RD will obtain nutrition related history and exam at follow up.    Medications reviewed and include: doxycycline, lovenox, ferrous sulfate, protonix, prednisone  Labs reviewed: K 4.8 wnl Wbc- 11.3(H)  NUTRITION - FOCUSED PHYSICAL EXAM: Unable to perform at this time   Diet Order:   Diet Order             Diet regular Room service appropriate? Yes; Fluid consistency: Thin  Diet effective now                  EDUCATION NEEDS:   No education needs have been identified at this time  Skin:  Skin Assessment: Reviewed RN Assessment  Last BM:  10/19  Height:   Ht Readings  from Last 1 Encounters:  06/05/23 5\' 3"  (1.6 m)    Weight:   Wt Readings from Last 1 Encounters:  04/18/23 94.3 kg   BMI:  Body mass index is 36.83 kg/m.  Estimated Nutritional Needs:   Kcal:  1900-2200kcal/day  Protein:  95-110g/day  Fluid:  1.6-1.8L/day  Betsey Holiday MS, RD, LDN Please refer to [facility] for RD and/or RD on-call/weekend/after hours pager

## 2023-06-05 NOTE — NC FL2 (Signed)
[place] MEDICAID FL2 LEVEL OF CARE FORM     IDENTIFICATION  Patient Name: Bianca Murray Birthdate: Feb 19, 1956 Sex: female Admission Date (Current Location): 06/03/2023  [facility] and [place] Number:  Producer, television/film/video and Address:         Provider Number: [id number]  Attending Physician Name and Address:  Lanae Boast, MD  Relative Name and Phone Number:  Reymundo Poll (Daughter)  [phone] (Mobile)    Current Level of Care: Hospital Recommended Level of Care: Skilled Nursing Facility Prior Approval Number:    Date Approved/Denied:   PASRR Number: [id number]  Discharge Plan: SNF    Current Diagnoses: Patient Active Problem List   Diagnosis Date Noted   History of COVID-19 06/04/2023   Acute exacerbation of chronic obstructive pulmonary disease (COPD) (HCC) 06/04/2023   Normocytic anemia 12/02/2022   Atypical chest pain 12/02/2022   Vision loss of left eye 12/05/2021   Dependence on supplemental oxygen 08/13/2021   Mixed hyperlipidemia 06/29/2021   Impaired mobility and personal care 06/28/2021   Generalized anxiety disorder 09/12/2020   GERD (gastroesophageal reflux disease) 03/22/2020   Hypertension 12/03/2018   Centrilobular emphysema (HCC) 12/03/2018   Vitamin D deficiency 11/27/2018   Chronic respiratory failure with hypoxia and hypercapnia (HCC)    COPD with acute exacerbation (HCC) 10/28/2018   COPD GOLD D (chronic obstructive pulmonary disease) (HCC) 10/28/2018    Orientation RESPIRATION BLADDER Height & Weight     Self, Time, Situation, Place  O2 (4L O2) Indwelling catheter Weight:   Height:  5\' 3"  (160 cm)  BEHAVIORAL SYMPTOMS/MOOD NEUROLOGICAL BOWEL NUTRITION STATUS   (none)   Continent Diet  AMBULATORY STATUS COMMUNICATION OF NEEDS Skin   Limited Assist Verbally Normal                       Personal Care Assistance Level of Assistance  Bathing, Feeding, Dressing, Total care Bathing Assistance: Limited assistance Feeding assistance:  Limited assistance Dressing Assistance: Limited assistance Total Care Assistance: Limited assistance   Functional Limitations Info  Sight, Hearing, Speech Sight Info: Adequate Hearing Info: Adequate Speech Info: Adequate    SPECIAL CARE FACTORS FREQUENCY  PT (By licensed PT), OT (By licensed OT)     PT Frequency: 5X per week OT Frequency: 5X per week            Contractures Contractures Info: Not present    Additional Factors Info  Allergies   Allergies Info: Prednisone           Current Medications (06/05/2023):  This is the current hospital active medication list Current Facility-Administered Medications  Medication Dose Route Frequency Provider Last Rate Last Admin   albuterol (PROVENTIL) (2.5 MG/3ML) 0.083% nebulizer solution 2.5 mg  2.5 mg Nebulization Q2H PRN Earlie Lou L, MD   2.5 mg at 06/05/23 0324   ALPRAZolam (XANAX) tablet 0.25 mg  0.25 mg Oral BID PRN Lanae Boast, MD   0.25 mg at 06/05/23 0929   arformoterol (BROVANA) nebulizer solution 15 mcg  15 mcg Nebulization Q12H Kc, Dayna Barker, MD   15 mcg at 06/05/23 0806   atorvastatin (LIPITOR) tablet 10 mg  10 mg Oral Daily Earlie Lou L, MD   10 mg at 06/05/23 1033   budesonide (PULMICORT) nebulizer solution 0.5 mg  0.5 mg Nebulization BID Kc, Ramesh, MD   0.5 mg at 06/05/23 0806   busPIRone (BUSPAR) tablet 15 mg  15 mg Oral TID Lanae Boast, MD   15 mg at 06/05/23 1034  chlorpheniramine-HYDROcodone (TUSSIONEX) 10-8 MG/5ML suspension 5 mL  5 mL Oral Q12H PRN Kc, Ramesh, MD   5 mL at 06/05/23 1210   [START ON 06/06/2023] diltiazem (CARDIZEM CD) 24 hr capsule 180 mg  180 mg Oral Daily Kc, Ramesh, MD       doxycycline (VIBRA-TABS) tablet 100 mg  100 mg Oral Q12H Garba, Mohammad L, MD   100 mg at 06/05/23 1033   enoxaparin (LOVENOX) injection 40 mg  40 mg Subcutaneous Q24H Earlie Lou L, MD   40 mg at 06/05/23 1034   feeding supplement (ENSURE ENLIVE / ENSURE PLUS) liquid 237 mL  237 mL Oral TID BM Kc, Ramesh,  MD       ferrous sulfate tablet 325 mg  325 mg Oral Daily Kc, Ramesh, MD   325 mg at 06/05/23 1034   fluticasone (FLONASE) 50 MCG/ACT nasal spray 2 spray  2 spray Each Nare Daily Rometta Emery, MD   2 spray at 06/05/23 1211   gabapentin (NEURONTIN) capsule 100 mg  100 mg Oral TID Lanae Boast, MD   100 mg at 06/05/23 1034   loratadine (CLARITIN) tablet 10 mg  10 mg Oral Daily Kc, Ramesh, MD   10 mg at 06/05/23 1033   [START ON 06/06/2023] multivitamin with minerals tablet 1 tablet  1 tablet Oral Daily Kc, Ramesh, MD       pantoprazole (PROTONIX) EC tablet 40 mg  40 mg Oral BID AC Garba, Mohammad L, MD   40 mg at 06/05/23 1034   predniSONE (DELTASONE) tablet 40 mg  40 mg Oral Q breakfast Kc, Ramesh, MD   40 mg at 06/05/23 1034   revefenacin (YUPELRI) nebulizer solution 175 mcg  175 mcg Nebulization Daily Kc, Ramesh, MD   175 mcg at 06/05/23 0805   sertraline (ZOLOFT) tablet 150 mg  150 mg Oral Daily Earlie Lou L, MD   150 mg at 06/05/23 1033   traMADol (ULTRAM) tablet 50 mg  50 mg Oral Q4H PRN Lanae Boast, MD         Discharge Medications: Please see discharge summary for a list of discharge medications.  Relevant Imaging Results:  Relevant Lab Results:   Additional Information    Harriett Sine, RN

## 2023-06-05 NOTE — Progress Notes (Signed)
PROGRESS NOTE Bianca Murray  ZOX:[id number] DOB: [birth year] DOA: 06/03/2023 PCP: Patient, No Pcp Per  Brief Narrative/Hospital Course: 67 year old female with history of COPD chronic hypoxic respiratory failure on 4L [place], anxiety, arthritis, history of tracheostomy, HLD, HTN, GERD ambulatory dysfunction who had recent COVID positive at a SNF on 05/29/2023 and having shortness of breath coughing has been sent to the ED with progressively worsening symptoms with shortness of breath. Patient stated symptoms onset was on 10/12-10/13.  In the ED,oxygen saturation stable on 4 L [place] home setting, afebrile at times tachycardic Labs shows chronic anemia hb-10.5 g stable renal function LFTs COVID-positive again, influenza negative.Chest x-ray stable. CT angio chest>>no PE, bronchial wall thickening bilaterally with strandy opacities at lung bases-possible atelectasis or infiltrate at the base, emphysema and aortic atherosclerosis noticed Patient was admitted for further management of copd exacerbation in the setting of COVID  Subjective: Patient seen examined.  She is resting in the bedside chair Reports has been having cough Tussionex helps. On 4 L nasal cannula home setting, afebrile overnight Blood sugar stable CBC and bmp   Assessment and Plan: Principal Problem:   Acute exacerbation of chronic obstructive pulmonary disease (COPD) (HCC) Active Problems:   COPD with acute exacerbation (HCC)   Hypertension   GERD (gastroesophageal reflux disease)   Generalized anxiety disorder   Mixed hyperlipidemia   Dependence on supplemental oxygen   History of COVID-19   Acute COPD exacerbation in the setting of COVID infection: Remains short of breath, having cough.  Diminished breath sounds bilaterally.  Continue current Solu-Medrol, doxycycline, bronchodilators Brovana Yupelri and Pulmicort, supplemental oxygen. Add Tussionex prn.   COVID-positive 10/16: Patient reports her symptom onset from past  weekend :10/12-13.  Patient has completed antiviral PTA. Continue symptomatic management antitussives supplemental oxygen, on steroid as above.     GERD:Cont ppi  HLD: Continue Lipitor  HTN: BP well-controlled continue Cardizem  GAD: Patient endorsing anxiety symptoms continue her Neurontin, Zoloft BuSpar 15 3 times daily, added Xanax low-dose as needed  Ambulatory dysfunction: PT OT as able.  DVT prophylaxis: enoxaparin (LOVENOX) injection 40 mg Start: 06/04/23 1000 SCDs Start: 06/04/23 8119 Code Status:   Code Status: Full Code Family Communication: plan of care discussed with patient at bedside . daughter was updated on  admission  Patient status is: Inpatient because of COPD exacerbation Level of care: Med-Surg   Dispo: The patient is from: Facility            Anticipated disposition: Facility in 2-3 days Objective: Vitals last 24 hrs: Vitals:   06/04/23 2230 06/04/23 2358 06/05/23 0324 06/05/23 0806  BP:  133/79    Pulse:  (!) 107    Resp:  20    Temp:  97.8 F (36.6 C)    TempSrc:      SpO2: 94% 95% 97% 98%   Weight change:   Physical Examination: General exam: alert awake, older than stated age HEENT:Oral mucosa moist, Ear/Nose WNL grossly Respiratory system: bilaterally diminished BS, expiratory wheezing Cardiovascular system: S1 & S2 +, No JVD. Gastrointestinal system: Abdomen soft,NT,ND, BS+ Nervous System:Alert, awake, moving extremities. Extremities: LE edema negg,distal peripheral pulses palpable.  Skin: No rashes,no icterus. MSK: Normal muscle bulk,tone, power  Medications reviewed: Scheduled Meds:  arformoterol  15 mcg Nebulization Q12H   atorvastatin  10 mg Oral Daily   budesonide  0.5 mg Nebulization BID   busPIRone  15 mg Oral TID   diltiazem  120 mg Oral Daily   doxycycline  100 mg Oral  Q12H   enoxaparin (LOVENOX) injection  40 mg Subcutaneous Q24H   ferrous sulfate  325 mg Oral Daily   fluticasone  2 spray Each Nare Daily   gabapentin   100 mg Oral TID   loratadine  10 mg Oral Daily   pantoprazole  40 mg Oral BID AC   predniSONE  40 mg Oral Q breakfast   revefenacin  175 mcg Nebulization Daily   sertraline  150 mg Oral Daily   Continuous Infusions:    Diet Order             Diet regular Room service appropriate? Yes; Fluid consistency: Thin  Diet effective now                 No intake or output data in the 24 hours ending 06/05/23 1114 Net IO Since Admission: No IO data has been entered for this period [06/05/23 1114]  Wt Readings from Last 3 Encounters:  04/18/23 94.3 kg  02/05/23 94.3 kg  12/02/22 94.6 kg     Unresulted Labs (From admission, onward)     Start     Ordered   06/11/23 0500  Creatinine, serum  (enoxaparin (LOVENOX)    CrCl >/= 30 ml/min)  Weekly,   R     Comments: while on enoxaparin therapy    06/04/23 0622   06/05/23 0500  CBC  Daily,   R      06/04/23 1220   06/05/23 0500  Basic metabolic panel  Daily,   R      06/04/23 1220          Data Reviewed: I have personally reviewed following labs and imaging studies CBC: Recent Labs  Lab 06/03/23 1939 06/05/23 0839  WBC 4.9 11.3*  NEUTROABS 2.5  --   HGB 10.5* 10.9*  HCT 35.5* 36.5  MCV 94.7 93.6  PLT 339 417*   Basic Metabolic Panel: Recent Labs  Lab 06/03/23 1939 06/04/23 0644 06/05/23 0839  NA 141 139 139  K 3.9 4.2 4.8  CL 98 97* 98  CO2 33* 33* 32  GLUCOSE 103* 162* 137*  BUN 9 9 15   CREATININE 0.72 0.61 0.72  CALCIUM 8.9 9.3 9.3   GFR: CrCl cannot be calculated (Unknown ideal weight.). Liver Function Tests: Recent Labs  Lab 06/04/23 0644  AST 12*  ALT 12  ALKPHOS 59  BILITOT 0.4  PROT 7.0  ALBUMIN 3.4*  Sepsis Labs: No results for input(s): "PROCALCITON", "LATICACIDVEN" in the last 168 hours.  Recent Results (from the past 240 hour(s))  Resp panel by RT-PCR (RSV, Flu A&B, Covid) Anterior Nasal Swab     Status: Abnormal   Collection Time: 06/04/23 12:40 AM   Specimen: Anterior Nasal Swab   Result Value Ref Range Status   SARS Coronavirus 2 by RT PCR POSITIVE (A) NEGATIVE Final    Comment: (NOTE) SARS-CoV-2 target nucleic acids are DETECTED.  The SARS-CoV-2 RNA is generally detectable in upper respiratory specimens during the acute phase of infection. Positive results are indicative of the presence of the identified virus, but do not rule out bacterial infection or co-infection with other pathogens not detected by the test. Clinical correlation with patient history and other diagnostic information is necessary to determine patient infection status. The expected result is Negative.  Fact Sheet for Patients: BloggerCourse.com  Fact Sheet for Healthcare Providers: SeriousBroker.it  This test is not yet approved or cleared by the [place] FDA and  has been authorized for detection and/or  diagnosis of SARS-CoV-2 by FDA under an Emergency Use Authorization (EUA).  This EUA will remain in effect (meaning this test can be used) for the duration of  the COVID-19 declaration under Section 564(b)(1) of the A ct, 21 U.S.C. section 360bbb-3(b)(1), unless the authorization is terminated or revoked sooner.     Influenza A by PCR NEGATIVE NEGATIVE Final   Influenza B by PCR NEGATIVE NEGATIVE Final    Comment: (NOTE) The Xpert Xpress SARS-CoV-2/FLU/RSV plus assay is intended as an aid in the diagnosis of influenza from Nasopharyngeal swab specimens and should not be used as a sole basis for treatment. Nasal washings and aspirates are unacceptable for Xpert Xpress SARS-CoV-2/FLU/RSV testing.  Fact Sheet for Patients: BloggerCourse.com  Fact Sheet for Healthcare Providers: SeriousBroker.it  This test is not yet approved or cleared by the [place] FDA and has been authorized for detection and/or diagnosis of SARS-CoV-2 by FDA under an Emergency Use Authorization (EUA).  This EUA will remain in effect (meaning this test can be used) for the duration of the COVID-19 declaration under Section 564(b)(1) of the Act, 21 U.S.C. section 360bbb-3(b)(1), unless the authorization is terminated or revoked.     Resp Syncytial Virus by PCR NEGATIVE NEGATIVE Final    Comment: (NOTE) Fact Sheet for Patients: BloggerCourse.com  Fact Sheet for Healthcare Providers: SeriousBroker.it  This test is not yet approved or cleared by the [place] FDA and has been authorized for detection and/or diagnosis of SARS-CoV-2 by FDA under an Emergency Use Authorization (EUA). This EUA will remain in effect (meaning this test can be used) for the duration of the COVID-19 declaration under Section 564(b)(1) of the Act, 21 U.S.C. section 360bbb-3(b)(1), unless the authorization is terminated or revoked.  Performed at [facility], [address] [place], [place] [address]     Antimicrobials: Anti-infectives (From admission, onward)    Start     Dose/Rate Route Frequency Ordered Stop   06/04/23 1000  doxycycline (VIBRA-TABS) tablet 100 mg        100 mg Oral Every 12 hours 06/04/23 0622 06/09/23 0959      Culture/Microbiology No results found for: "SDES", "SPECREQUEST", "CULT", "REPTSTATUS"   Radiology Studies: CT Angio Chest PE W and/or Wo Contrast  Result Date: 06/04/2023 CLINICAL DATA:  Pulmonary embolism suspected, high probability. Progressively worsening shortness of breath. Positive COVID test on 05/29/2023. History of COPD. EXAM: CT ANGIOGRAPHY CHEST WITH CONTRAST TECHNIQUE: Multidetector CT imaging of the chest was performed using the standard protocol during bolus administration of intravenous contrast. Multiplanar CT image reconstructions and MIPs were obtained to evaluate the vascular anatomy. RADIATION DOSE REDUCTION: This exam was performed according to the departmental dose-optimization  program which includes automated exposure control, adjustment of the mA and/or kV according to patient size and/or use of iterative reconstruction technique. CONTRAST:  75mL OMNIPAQUE IOHEXOL 350 MG/ML SOLN COMPARISON:  None Available. FINDINGS: Cardiovascular: The heart is normal in size and there is no pericardial effusion. There is atherosclerotic calcification of the aorta without evidence of aneurysm. The pulmonary trunk is normal in caliber. No evidence of pulmonary embolism. Mediastinum/Nodes: No mediastinal, hilar, or axillary lymphadenopathy. The thyroid gland and esophagus are within normal limits. Small hiatal hernia is noted. There is collapse of the mid trachea, which may be associated with tracheomalacia. Lungs/Pleura: Emphysematous changes are present in the lungs. Bronchial wall thickening is noted bilaterally, most pronounced in the right lower lobe. Strandy airspace disease is noted in the lingular segment of the left  upper lobe, right middle lobe, and lower lobes bilaterally. No effusion or pneumothorax. Upper Abdomen: No acute abnormality. Musculoskeletal: No acute osseous abnormality. Review of the MIP images confirms the above findings. IMPRESSION: 1. No evidence of pulmonary embolism. 2. Bronchial wall thickening bilaterally with strandy opacities at the lung bases, possible atelectasis or infiltrate. 3. Emphysema. 4. Aortic atherosclerosis. Electronically Signed   By: Thornell Sartorius M.D.   On: 06/04/2023 00:07   DG Chest Port 1 View  Result Date: 06/03/2023 CLINICAL DATA:  Shortness of breath, tested positive for COVID on 10/16. EXAM: PORTABLE CHEST 1 VIEW COMPARISON:  04/18/2023. FINDINGS: The heart size and mediastinal contours are within normal limits. There is atherosclerotic calcification of the aorta. Subsegmental atelectasis or scarring is noted in the mid lungs bilaterally. No consolidation, effusion, or pneumothorax. No acute osseous abnormality. IMPRESSION: Stable chest with no  active disease. Electronically Signed   By: Thornell Sartorius M.D.   On: 06/03/2023 22:20     LOS: 1 day   Lanae Boast, MD [facility]  06/05/2023, 11:14 AM

## 2023-06-05 NOTE — Evaluation (Addendum)
Physical Therapy Evaluation Patient Details Name: Bianca Murray MRN: [id number] DOB: 1956/06/16 Today's Date: 06/05/2023  History of Present Illness  Pt is a 67 y/o F admitted on 06/03/23. Pt was diagnosed with COVID last week. Pt presented to the ED with c/o worsening SOB, cough, wheezing. Pt is being treated for acute exacerbation of COPD likely triggered by recent viral infection. PMH: COPD, chronic respiratory failure on home O2, tracheostomy, HLD, essential HTN, GERD, asthma, CA  Clinical Impression  Pt seen for PT evaluation with pt agreeable. Pt reports she was at [facility] prior to admission, completing stand pivot transfers bed<>w/c 2/2 hx of falling. Pt also with cast on L wrist - per chart, pt is NWB as of 05/16/23 with f/u appointment scheduled in 3 weeks with plans to transition to wrist brace. Pt reports she's NWB but does not maintain this, as she uses LUE to assist with transition to sitting EOB & supported sitting EOB despite PT cuing/education. Pt is able to complete bed>chair with RUE HHA min assist with decreased balance & strength. Pt politely declines gait attempts, instead wanting to eat breakfast that just arrived. Reviewed use of call bell with pt & need for assistance when wanting to get out of chair with pt verbalizing understanding. Will continue to follow pt acutely to progress mobility as able.      If plan is discharge home, recommend the following: A little help with walking and/or transfers;A little help with bathing/dressing/bathroom   Can travel by private vehicle   No    Equipment Recommendations None recommended by PT  Recommendations for Other Services       Functional Status Assessment Patient has had a recent decline in their functional status and demonstrates the ability to make significant improvements in function in a reasonable and predictable amount of time.     Precautions / Restrictions Precautions Precautions: Fall Restrictions Weight  Bearing Restrictions: Yes LLE Weight Bearing:  (NWB L wrist) Other Position/Activity Restrictions: pt with red cast on L wrist, per chart, pt NWB as of 05/16/23 with f/u scheduled in 3 weeks      Mobility  Bed Mobility Overal bed mobility: Needs Assistance Bed Mobility: Supine to Sit     Supine to sit: Supervision, HOB elevated, Used rails     General bed mobility comments: exits L side of bed, uses LUE despite cuing not to    Transfers Overall transfer level: Needs assistance Equipment used: 1 person hand held assist Transfers: Sit to/from Stand, Bed to chair/wheelchair/BSC Sit to Stand: Min assist   Step pivot transfers: Min assist (RUE HHA)            Ambulation/Gait Ambulation/Gait assistance:  (Pt declined gait attempts, reports she'd like to eat breakfast first.)                Stairs            Wheelchair Mobility     Tilt Bed    Modified Rankin (Stroke Patients Only)       Balance Overall balance assessment: Needs assistance, History of Falls   Sitting balance-Leahy Scale: Good     Standing balance support: Single extremity supported, During functional activity Standing balance-Leahy Scale: Poor                               Pertinent Vitals/Pain Pain Assessment Pain Assessment: No/denies pain    Home Living Family/patient expects to be discharged  to:: Skilled nursing facility                   Additional Comments: Pt reports she was previosuly at [facility] for rehab but is now a resident there.    Prior Function               Mobility Comments: Pt reports a couple falls in the past & because of this has not been walking as much. Pt reports she has been completing stand pivot transfers bed<>w/c without assistance.       Extremity/Trunk Assessment   Upper Extremity Assessment Upper Extremity Assessment: LUE deficits/detail LUE Deficits / Details: L forearm/wrist in cast    Lower Extremity  Assessment Lower Extremity Assessment: Generalized weakness       Communication   Communication Communication: No apparent difficulties  Cognition Arousal: Alert Behavior During Therapy: WFL for tasks assessed/performed Overall Cognitive Status: Within Functional Limits for tasks assessed                                 General Comments: Pt pleasant overall. Per chart, pt with issues with anxiety with mobility but nurse premedicated pt prior to PT evaluation & pt tolerated session well. Pt did state "please don't rush me" & given plenty of time to mobilize at her comfort level.        General Comments General comments (skin integrity, edema, etc.): Pt on 4L/min via nasal cannula, lowest SpO2 86% with supine>sit, HR 123 bpm but pt able to recover with rest/time.    Exercises     Assessment/Plan    PT Assessment Patient needs continued PT services  PT Problem List Decreased strength;Cardiopulmonary status limiting activity;Decreased activity tolerance;Decreased balance;Decreased mobility;Decreased knowledge of precautions;Decreased safety awareness;Decreased knowledge of use of DME       PT Treatment Interventions DME instruction;Balance training;Modalities;Gait training;Neuromuscular re-education;Stair training;Functional mobility training;Patient/family education;Therapeutic activities;Therapeutic exercise;Manual techniques    PT Goals (Current goals can be found in the Care Plan section)  Acute Rehab PT Goals Patient Stated Goal: get better PT Goal Formulation: With patient Time For Goal Achievement: 06/19/23 Potential to Achieve Goals: Good    Frequency Min 1X/week     Co-evaluation               AM-PAC PT "6 Clicks" Mobility  Outcome Measure Help needed turning from your back to your side while in a flat bed without using bedrails?: A Little Help needed moving from lying on your back to sitting on the side of a flat bed without using bedrails?: A  Little Help needed moving to and from a bed to a chair (including a wheelchair)?: A Little Help needed standing up from a chair using your arms (e.g., wheelchair or bedside chair)?: A Little Help needed to walk in hospital room?: A Lot Help needed climbing 3-5 steps with a railing? : A Lot 6 Click Score: 16    End of Session Equipment Utilized During Treatment: Oxygen Activity Tolerance: Patient tolerated treatment well Patient left: in chair;with chair alarm set;with call bell/phone within reach Nurse Communication: Mobility status (need for cord to plug chair alarm into wall) PT Visit Diagnosis: Muscle weakness (generalized) (M62.81);Difficulty in walking, not elsewhere classified (R26.2);Other abnormalities of gait and mobility (R26.89);Unsteadiness on feet (R26.81)    Time: 1610-9604 PT Time Calculation (min) (ACUTE ONLY): 14 min   Charges:   PT Evaluation $PT Eval Low Complexity: 1 Low  PT General Charges $$ ACUTE PT VISIT: 1 Visit         Aleda Grana, PT, DPT 06/05/23, 10:47 AM   Sandi Mariscal 06/05/2023, 10:41 AM

## 2023-06-06 DIAGNOSIS — J441 Chronic obstructive pulmonary disease with (acute) exacerbation: Secondary | ICD-10-CM | POA: Diagnosis not present

## 2023-06-06 LAB — BASIC METABOLIC PANEL
Anion gap: 10 (ref 5–15)
BUN: 21 mg/dL (ref 8–23)
CO2: 30 mmol/L (ref 22–32)
Calcium: 9.2 mg/dL (ref 8.9–10.3)
Chloride: 96 mmol/L — ABNORMAL LOW (ref 98–111)
Creatinine, Ser: 0.7 mg/dL (ref 0.44–1.00)
GFR, Estimated: >60 mL/min (ref >60)
Glucose, Bld: 121 mg/dL — ABNORMAL HIGH (ref 70–99)
Potassium: 4.1 mmol/L (ref 3.5–5.1)
Sodium: 136 mmol/L (ref 135–145)

## 2023-06-06 LAB — CBC
HCT: 35.5 % — ABNORMAL LOW (ref 36.0–46.0)
Hemoglobin: 10.7 g/dL — ABNORMAL LOW (ref 12.0–15.0)
MCH: 28.3 pg (ref 26.0–34.0)
MCHC: 30.1 g/dL (ref 30.0–36.0)
MCV: 93.9 fL (ref 80.0–100.0)
Platelets: 380 10*3/uL (ref 150–400)
RBC: 3.78 MIL/uL — ABNORMAL LOW (ref 3.87–5.11)
RDW: 14.5 % (ref 11.5–15.5)
WBC: 10.4 10*3/uL (ref 4.0–10.5)
nRBC: 0 % (ref 0.0–0.2)

## 2023-06-06 LAB — PHOSPHORUS: Phosphorus: 3.7 mg/dL (ref 2.5–4.6)

## 2023-06-06 LAB — MAGNESIUM: Magnesium: 2.1 mg/dL (ref 1.7–2.4)

## 2023-06-06 MED ORDER — GUAIFENESIN-DM 100-10 MG/5ML PO SYRP
5.0000 mL | ORAL_SOLUTION | ORAL | Status: DC | PRN
  Administered 2023-06-06 – 2023-06-07 (×3): 5 mL via ORAL
  Filled 2023-06-06 (×3): qty 10

## 2023-06-06 MED ORDER — PHENOL 1.4 % MT LIQD
1.0000 | OROMUCOSAL | Status: DC | PRN
  Administered 2023-06-06: 1 via OROMUCOSAL
  Filled 2023-06-06: qty 177

## 2023-06-06 NOTE — TOC Progression Note (Signed)
Transition of Care [facility]) - Progression Note    Patient Details  Name: Bianca Murray MRN: [id number] Date of Birth: 1955-10-02  [facility] of [facility] [place] CM/SW Contact  Rogan Ecklund, Olegario Messier, RN Phone Number: 06/06/2023, 3:56 PM  Clinical Narrative: Reola Calkins pending [phone] for [facility].      Expected Discharge Plan: Skilled Nursing Facility Barriers to Discharge: No Barriers Identified  Expected Discharge Plan and Services In-house Referral: NA Discharge Planning Services: NA   Living arrangements for the past 2 months: Skilled Nursing Facility                 DME Arranged: N/A         HH Arranged:  (NA)           Social Determinants of Health (SDOH) Interventions SDOH Screenings   Food Insecurity: No Food Insecurity (06/05/2023)  Housing: Low Risk  (06/05/2023)  Transportation Needs: No Transportation Needs (06/05/2023)  Utilities: Not At Risk (06/05/2023)  Alcohol Screen: Low Risk  (02/05/2023)  Depression (PHQ2-9): Low Risk  (02/05/2023)  Financial Resource Strain: Low Risk  (02/05/2023)  Physical Activity: Inactive (02/05/2023)  Social Connections: Socially Isolated (02/05/2023)  Stress: Stress Concern Present (02/05/2023)  Tobacco Use: Medium Risk (06/03/2023)    Readmission Risk Interventions     No data to display

## 2023-06-06 NOTE — Progress Notes (Signed)
Occupational Therapy Treatment Patient Details Name: Bianca Murray MRN: [id number] DOB: 1955-12-26 Today's Date: 06/06/2023   History of present illness Pt is a 67 yr old female admitted on 06/03/23. Pt was diagnosed with COVID last week. Pt presented to the ED with c/o worsening SOB, cough, wheezing. Pt is being treated for acute exacerbation of COPD likely triggered by recent viral infection. PMH: COPD, chronic respiratory failure on home O2, tracheostomy, HLD, essential HTN, GERD, asthma, CA   OT comments  The pt required assist for toileting at bedside commode level, as well as for sit to stand using a RW, then to step-pivot from the bedside commode to the chair using a RW. She presented with shakiness in standing & shortness of breath with activity. She required therapeutic rest breaks and cues for pursed lip breathing. Continue OT plan of care. Patient will benefit from continued inpatient follow up therapy, <3 hours/day       If plan is discharge home, recommend the following:  A lot of help with bathing/dressing/bathroom;Direct supervision/assist for medications management;A lot of help with walking and/or transfers   Equipment Recommendations  None recommended by OT    Recommendations for Other Services      Precautions / Restrictions Precautions Precautions: Fall Restrictions Weight Bearing Restrictions: Yes LUE Weight Bearing: Non weight bearing Other Position/Activity Restrictions: pt with red cast on L wrist, per chart, pt NWB as of 05/16/23 with f/u scheduled in 3 weeks       Mobility   Transfers Overall transfer level: Needs assistance Equipment used: Rolling walker (2 wheels) Transfers: Sit to/from Stand Sit to Stand: Contact guard assist     Step pivot transfers: Min assist     General transfer comment: Pt transferred from the [facility] to chair using a RW. She required assist for balance and cues for walker placement.         ADL either performed or  assessed with clinical judgement   ADL Overall ADL's : Needs assistance/impaired Eating/Feeding: Set up;Sitting Eating/Feeding Details (indicate cue type and reason): Pt presents with intermittent BUE tremors, which she reports to be chronic in nature. As such, she requires occasional set-up assist for feeding. Grooming: Set up;Sitting   Upper Body Bathing: Set up;Sitting Upper Body Bathing Details (indicate cue type and reason): based on clinical judgement Lower Body Bathing: Moderate assistance Lower Body Bathing Details (indicate cue type and reason): based on clinical judgement Upper Body Dressing : Set up;Sitting           Toileting- Clothing Manipulation and Hygiene: Maximal assistance;Sit to/from stand Toileting - Clothing Manipulation Details (indicate cue type and reason): Pt received seated on the bedside commode at the start of the session. She required max assist to perform posterior peri-hygiene, as well as assist for clothing management and balance/steadying in standing.              Cognition Arousal: Alert Behavior During Therapy: WFL for tasks assessed/performed Overall Cognitive Status: Within Functional Limits for tasks assessed                      Pertinent Vitals/ Pain       Pain Assessment Pain Assessment: 0-10 Pain Score: 3  Pain Location: L wrist Pain Intervention(s): Limited activity within patient's tolerance, Monitored during session         Frequency  Min 1X/week        Progress Toward Goals  OT Goals(current goals can now be found in the care  plan section)     Acute Rehab OT Goals Patient Stated Goal: to be as independent as possible OT Goal Formulation: With patient Time For Goal Achievement: 06/19/23 Potential to Achieve Goals: Good  Plan         AM-PAC OT "6 Clicks" Daily Activity     Outcome Measure   Help from another person eating meals?: A Little Help from another person taking care of personal grooming?: A  Little Help from another person toileting, which includes using toliet, bedpan, or urinal?: A Lot Help from another person bathing (including washing, rinsing, drying)?: A Lot Help from another person to put on and taking off regular upper body clothing?: A Little Help from another person to put on and taking off regular lower body clothing?: A Lot 6 Click Score: 15    End of Session Equipment Utilized During Treatment: Oxygen;Rolling walker (2 wheels)  OT Visit Diagnosis: Unsteadiness on feet (R26.81);Muscle weakness (generalized) (M62.81);Pain Pain - Right/Left: Left Pain - part of body: Arm   Activity Tolerance Other (comment) (Fair tolerance; limited by compromised endurance and shortness of breath with activity)   Patient Left in chair;with call bell/phone within reach   Nurse Communication Other (comment)        Time: 2130-8657 OT Time Calculation (min): 13 min  Charges: OT General Charges $OT Visit: 1 Visit OT Treatments $Self Care/Home Management : 8-22 mins     Reuben Likes, OTR/L 06/06/2023, 4:18 PM

## 2023-06-06 NOTE — TOC Initial Note (Signed)
Transition of Care [facility]) - Initial/Assessment Note    Patient Details  Name: Bianca Murray MRN: [id number] Date of Birth: Jan 06, 1956  [facility] CM/SW Contact:    Harriett Sine, RN Phone Number: 06/06/2023, 3:13 PM  Clinical Narrative:                 Pt from [facility], pt has pcp. Spoke with pt at bedside  06/05/23 about SNF placement and d/c plans. Pt states she will be returning to [facility] at d/c. Called Rhonda with [facility] to confirm holding bed for pt. TOC following  Expected Discharge Plan: Skilled Nursing Facility Barriers to Discharge: No Barriers Identified   Patient Goals and CMS Choice Patient states their goals for this hospitalization and ongoing recovery are:: none stated CMS Medicare.gov Compare Post Acute Care list provided to::  (returning to [facility]) Choice offered to / list presented to : NA  ownership interest in [facility].provided to::  (none)    Expected Discharge Plan and Services In-house Referral: NA Discharge Planning Services: NA   Living arrangements for the past 2 months: Skilled Nursing Facility                 DME Arranged: N/A         HH Arranged:  (NA)          Prior Living Arrangements/Services Living arrangements for the past 2 months: Skilled Nursing Facility Lives with:: Facility Resident Patient language and need for interpreter reviewed:: Yes Do you feel safe going back to the place where you live?: Yes      Need for Family Participation in Patient Care: Yes (Comment) Care giver support system in place?: Yes (comment) Current home services:  (NA) Criminal Activity/Legal Involvement Pertinent to Current Situation/Hospitalization: No - Comment as needed  Activities of Daily Living   ADL Screening (condition at time of admission) Independently performs ADLs?: No Does the patient have a NEW difficulty with bathing/dressing/toileting/self-feeding that is expected to last >3  days?: Yes (Initiates electronic notice to provider for possible OT consult) Does the patient have a NEW difficulty with getting in/out of bed, walking, or climbing stairs that is expected to last >3 days?: Yes (Initiates electronic notice to provider for possible PT consult) Does the patient have a NEW difficulty with communication that is expected to last >3 days?: No Is the patient deaf or have difficulty hearing?: Yes Does the patient have difficulty seeing, even when wearing glasses/contacts?: No Does the patient have difficulty concentrating, remembering, or making decisions?: No  Permission Sought/Granted Permission sought to share information with : Case Manager, Magazine features editor Permission granted to share information with : Yes, Verbal Permission Granted              Emotional Assessment Appearance:: Appears stated age Attitude/Demeanor/Rapport: Engaged Affect (typically observed): Calm Orientation: : Oriented to Self, Oriented to Place, Oriented to  Time, Oriented to Situation Alcohol / Substance Use: Other (comment) (quit smoking) Psych Involvement: No (comment)  Admission diagnosis:  Acute exacerbation of chronic obstructive pulmonary disease (COPD) (HCC) [J44.1] COPD exacerbation (HCC) [J44.1] Patient Active Problem List   Diagnosis Date Noted   History of COVID-19 06/04/2023   Acute exacerbation of chronic obstructive pulmonary disease (COPD) (HCC) 06/04/2023   Normocytic anemia 12/02/2022   Atypical chest pain 12/02/2022   Vision loss of left eye 12/05/2021   Dependence on supplemental oxygen 08/13/2021   Mixed hyperlipidemia 06/29/2021   Impaired mobility and personal care 06/28/2021  Generalized anxiety disorder 09/12/2020   GERD (gastroesophageal reflux disease) 03/22/2020   Hypertension 12/03/2018   Centrilobular emphysema (HCC) 12/03/2018   Vitamin D deficiency 11/27/2018   Chronic respiratory failure with hypoxia and hypercapnia (HCC)     COPD with acute exacerbation (HCC) 10/28/2018   COPD GOLD D (chronic obstructive pulmonary disease) (HCC) 10/28/2018   PCP:  Patient, No Pcp Per Pharmacy:   [id number] - [place], [place] - [address] AT [facility] [address] [place] [address] Phone: [phone] Fax: [phone]     Social Determinants of Health (SDOH) Social History: SDOH Screenings   Food Insecurity: No Food Insecurity (06/05/2023)  Housing: Low Risk  (06/05/2023)  Transportation Needs: No Transportation Needs (06/05/2023)  Utilities: Not At Risk (06/05/2023)  Alcohol Screen: Low Risk  (02/05/2023)  Depression (PHQ2-9): Low Risk  (02/05/2023)  Financial Resource Strain: Low Risk  (02/05/2023)  Physical Activity: Inactive (02/05/2023)  Social Connections: Socially Isolated (02/05/2023)  Stress: Stress Concern Present (02/05/2023)  Tobacco Use: Medium Risk (06/03/2023)   SDOH Interventions:     Readmission Risk Interventions     No data to display

## 2023-06-06 NOTE — Progress Notes (Signed)
PROGRESS NOTE Bianca Murray  XBM:[id number] DOB: 03-Nov-1955 DOA: 06/03/2023 PCP: Patient, No Pcp Per  Brief Narrative/Hospital Course: 67 year old female with history of COPD chronic hypoxic respiratory failure on 4L [place], anxiety, arthritis, history of tracheostomy, HLD, HTN, GERD ambulatory dysfunction who had recent COVID positive at a SNF on 05/29/2023 and having shortness of breath coughing has been sent to the ED with progressively worsening symptoms with shortness of breath. Patient stated symptoms onset was on 10/12-10/13.  In the ED,oxygen saturation stable on 4 L [place] home setting, afebrile at times tachycardic Labs shows chronic anemia hb-10.5 g stable renal function LFTs COVID-positive again, influenza negative.Chest x-ray stable. CT angio chest>>no PE, bronchial wall thickening bilaterally with strandy opacities at lung bases-possible atelectasis or infiltrate at the base, emphysema and aortic atherosclerosis noticed Patient was admitted for further management of copd exacerbation in the setting of COVID  Subjective: Patient seen and examined -Afebrile vitals stable on 4 L nasal cannula  Still complains of cough, shortness of breath about the same No nausea vomiting or chest pain  Assessment and Plan: Principal Problem:   Acute exacerbation of chronic obstructive pulmonary disease (COPD) (HCC) Active Problems:   COPD with acute exacerbation (HCC)   Hypertension   GERD (gastroesophageal reflux disease)   Generalized anxiety disorder   Mixed hyperlipidemia   Dependence on supplemental oxygen   History of COVID-19   Acute COPD exacerbation in the setting of COVID infection: Patient is slow to improve has bilateral diminished breath sounds with wheezing.  Overall afebrile, oxygen saturation about the same on 4 L.  Continue on systemic steroids, doxycycline, bronchodilators alogn with  Brovana/Yupelri/Pulmicort and antitussives.     COVID-positive 10/16: Patient reports her  symptom onset from past weekend :10/12-13. Patient has completed antiviral PTA. Continue symptomatic management  as above.     GERD: Stable on ppi  HLD: Continue Lipitor  HTN: BP well-controlled continue Cardizem  [id number] endorsing anxiety symptoms but overall feeling much better on Neurontin, Zoloft BuSpar  and Xanax   Ambulatory dysfunction: cont PT OT as able.  DVT prophylaxis: enoxaparin (LOVENOX) injection 40 mg Start: 06/04/23 1000 SCDs Start: 06/04/23 0347 Code Status:   Code Status: Full Code Family Communication: plan of care discussed with patient at bedside . daughter was updated on  admission  Patient status is: Inpatient because of COPD exacerbation Level of care: Telemetry   Dispo: The patient is from: SNF            Anticipated disposition: snf-PT OT following and recommending skilled nursing facility upon discharge.    Objective: Vitals last 24 hrs: Vitals:   06/05/23 2236 06/06/23 0301 06/06/23 0809 06/06/23 0844  BP: (!) 125/90 (!) 141/81    Pulse: 86 83    Resp: 17 18    Temp: 98.4 F (36.9 C) 97.8 F (36.6 C)    TempSrc: Oral Oral    SpO2: 97% 99% 97%   Weight:    89.6 kg  Height:       Weight change:   Physical Examination: General exam: alert awake,  HEENT:Oral mucosa moist, Ear/Nose WNL grossly Respiratory system: Bilaterally diminished breath sounds with diffuse wheezing,no use of accessory muscle Cardiovascular system: S1 & S2 +, No JVD. Gastrointestinal system: Abdomen soft,NT,ND, BS+ Nervous System: Alert, awake, moving all extremities,and following commands. Extremities: LE edema neg,distal peripheral pulses palpable and warm.  Skin: No rashes,no icterus. MSK: Normal muscle bulk,tone, power   Medications reviewed: Scheduled Meds:  arformoterol  15 mcg Nebulization Q12H  atorvastatin  10 mg Oral Daily   budesonide  0.5 mg Nebulization BID   busPIRone  15 mg Oral TID   diltiazem  180 mg Oral Daily   doxycycline  100 mg Oral  Q12H   enoxaparin (LOVENOX) injection  40 mg Subcutaneous Q24H   feeding supplement  237 mL Oral TID BM   ferrous sulfate  325 mg Oral Daily   fluticasone  2 spray Each Nare Daily   gabapentin  100 mg Oral TID   loratadine  10 mg Oral Daily   multivitamin with minerals  1 tablet Oral Daily   pantoprazole  40 mg Oral BID AC   predniSONE  40 mg Oral Q breakfast   revefenacin  175 mcg Nebulization Daily   sertraline  150 mg Oral Daily   Continuous Infusions:    Diet Order             Diet regular Room service appropriate? Yes; Fluid consistency: Thin  Diet effective now                  Intake/Output Summary (Last 24 hours) at 06/06/2023 1127 Last data filed at 06/06/2023 0100 Gross per 24 hour  Intake 240 ml  Output 300 ml  Net -60 ml   Net IO Since Admission: -60 mL [06/06/23 1127]  Wt Readings from Last 3 Encounters:  06/06/23 89.6 kg  04/18/23 94.3 kg  02/05/23 94.3 kg     Unresulted Labs (From admission, onward)     Start     Ordered   06/11/23 0500  Creatinine, serum  (enoxaparin (LOVENOX)    CrCl >/= 30 ml/min)  Weekly,   R     Comments: while on enoxaparin therapy    06/04/23 0622   06/06/23 0500  Magnesium  Daily,   R      06/05/23 1246   06/06/23 0500  Phosphorus  Daily,   R      06/05/23 1246   06/05/23 0500  CBC  Daily,   R      06/04/23 1220   06/05/23 0500  Basic metabolic panel  Daily,   R      06/04/23 1220          Data Reviewed: I have personally reviewed following labs and imaging studies CBC: Recent Labs  Lab 06/03/23 1939 06/05/23 0839 06/06/23 0456  WBC 4.9 11.3* 10.4  NEUTROABS 2.5  --   --   HGB 10.5* 10.9* 10.7*  HCT 35.5* 36.5 35.5*  MCV 94.7 93.6 93.9  PLT 339 417* 380   Basic Metabolic Panel: Recent Labs  Lab 06/03/23 1939 06/04/23 0644 06/05/23 0839 06/06/23 0456  NA 141 139 139 136  K 3.9 4.2 4.8 4.1  CL 98 97* 98 96*  CO2 33* 33* 32 30  GLUCOSE 103* 162* 137* 121*  BUN 9 9 15 21   CREATININE 0.72 0.61  0.72 0.70  CALCIUM 8.9 9.3 9.3 9.2  MG  --   --   --  2.1  PHOS  --   --   --  3.7   GFR: Estimated Creatinine Clearance: 71.5 mL/min (by C-G formula based on SCr of 0.7 mg/dL). Liver Function Tests: Recent Labs  Lab 06/04/23 0644  AST 12*  ALT 12  ALKPHOS 59  BILITOT 0.4  PROT 7.0  ALBUMIN 3.4*  Sepsis Labs: No results for input(s): "PROCALCITON", "LATICACIDVEN" in the last 168 hours.  Recent Results (from the past 240 hour(s))  Resp panel  by RT-PCR (RSV, Flu A&B, Covid) Anterior Nasal Swab     Status: Abnormal   Collection Time: 06/04/23 12:40 AM   Specimen: Anterior Nasal Swab  Result Value Ref Range Status   SARS Coronavirus 2 by RT PCR POSITIVE (A) NEGATIVE Final    Comment: (NOTE) SARS-CoV-2 target nucleic acids are DETECTED.  The SARS-CoV-2 RNA is generally detectable in upper respiratory specimens during the acute phase of infection. Positive results are indicative of the presence of the identified virus, but do not rule out bacterial infection or co-infection with other pathogens not detected by the test. Clinical correlation with patient history and other diagnostic information is necessary to determine patient infection status. The expected result is Negative.  Fact Sheet for Patients: BloggerCourse.com  Fact Sheet for Healthcare Providers: SeriousBroker.it  This test is not yet approved or cleared by the [place] FDA and  has been authorized for detection and/or diagnosis of SARS-CoV-2 by FDA under an Emergency Use Authorization (EUA).  This EUA will remain in effect (meaning this test can be used) for the duration of  the COVID-19 declaration under Section 564(b)(1) of the A ct, 21 U.S.C. section 360bbb-3(b)(1), unless the authorization is terminated or revoked sooner.     Influenza A by PCR NEGATIVE NEGATIVE Final   Influenza B by PCR NEGATIVE NEGATIVE Final    Comment: (NOTE) The Xpert Xpress  SARS-CoV-2/FLU/RSV plus assay is intended as an aid in the diagnosis of influenza from Nasopharyngeal swab specimens and should not be used as a sole basis for treatment. Nasal washings and aspirates are unacceptable for Xpert Xpress SARS-CoV-2/FLU/RSV testing.  Fact Sheet for Patients: BloggerCourse.com  Fact Sheet for Healthcare Providers: SeriousBroker.it  This test is not yet approved or cleared by the [place] FDA and has been authorized for detection and/or diagnosis of SARS-CoV-2 by FDA under an Emergency Use Authorization (EUA). This EUA will remain in effect (meaning this test can be used) for the duration of the COVID-19 declaration under Section 564(b)(1) of the Act, 21 U.S.C. section 360bbb-3(b)(1), unless the authorization is terminated or revoked.     Resp Syncytial Virus by PCR NEGATIVE NEGATIVE Final    Comment: (NOTE) Fact Sheet for Patients: BloggerCourse.com  Fact Sheet for Healthcare Providers: SeriousBroker.it  This test is not yet approved or cleared by the [place] FDA and has been authorized for detection and/or diagnosis of SARS-CoV-2 by FDA under an Emergency Use Authorization (EUA). This EUA will remain in effect (meaning this test can be used) for the duration of the COVID-19 declaration under Section 564(b)(1) of the Act, 21 U.S.C. section 360bbb-3(b)(1), unless the authorization is terminated or revoked.  Performed at [facility], [address] [place], [place] [address]     Antimicrobials: Anti-infectives (From admission, onward)    Start     Dose/Rate Route Frequency Ordered Stop   06/04/23 1000  doxycycline (VIBRA-TABS) tablet 100 mg        100 mg Oral Every 12 hours 06/04/23 0622 06/09/23 0959      Culture/Microbiology No results found for: "SDES", "SPECREQUEST", "CULT", "REPTSTATUS"   Radiology  Studies: No results found.   LOS: 2 days   Lanae Boast, MD [facility]  06/06/2023, 11:27 AM

## 2023-06-07 DIAGNOSIS — J441 Chronic obstructive pulmonary disease with (acute) exacerbation: Secondary | ICD-10-CM | POA: Diagnosis not present

## 2023-06-07 LAB — CBC
HCT: 35.4 % — ABNORMAL LOW (ref 36.0–46.0)
Hemoglobin: 10.3 g/dL — ABNORMAL LOW (ref 12.0–15.0)
MCH: 27.8 pg (ref 26.0–34.0)
MCHC: 29.1 g/dL — ABNORMAL LOW (ref 30.0–36.0)
MCV: 95.7 fL (ref 80.0–100.0)
Platelets: 365 10*3/uL (ref 150–400)
RBC: 3.7 MIL/uL — ABNORMAL LOW (ref 3.87–5.11)
RDW: 14.4 % (ref 11.5–15.5)
WBC: 11 10*3/uL — ABNORMAL HIGH (ref 4.0–10.5)
nRBC: 0 % (ref 0.0–0.2)

## 2023-06-07 LAB — BASIC METABOLIC PANEL
Anion gap: 9 (ref 5–15)
BUN: 19 mg/dL (ref 8–23)
CO2: 30 mmol/L (ref 22–32)
Calcium: 9.1 mg/dL (ref 8.9–10.3)
Chloride: 102 mmol/L (ref 98–111)
Creatinine, Ser: 0.65 mg/dL (ref 0.44–1.00)
GFR, Estimated: >60 mL/min (ref >60)
Glucose, Bld: 129 mg/dL — ABNORMAL HIGH (ref 70–99)
Potassium: 4.4 mmol/L (ref 3.5–5.1)
Sodium: 141 mmol/L (ref 135–145)

## 2023-06-07 LAB — PHOSPHORUS: Phosphorus: 3.1 mg/dL (ref 2.5–4.6)

## 2023-06-07 LAB — MAGNESIUM: Magnesium: 1.9 mg/dL (ref 1.7–2.4)

## 2023-06-07 MED ORDER — HYDROCOD POLI-CHLORPHE POLI ER 10-8 MG/5ML PO SUER: 5.0000 mL | Freq: Two times a day (BID) | ORAL | 0 refills | Status: AC | PRN

## 2023-06-07 MED ORDER — DOXYCYCLINE HYCLATE 100 MG PO TABS: 100.0000 mg | ORAL_TABLET | Freq: Two times a day (BID) | ORAL | Status: AC

## 2023-06-07 MED ORDER — TRAMADOL HCL 50 MG PO TABS: 50.0000 mg | ORAL_TABLET | ORAL | 0 refills | Status: AC | PRN

## 2023-06-07 MED ORDER — PREDNISONE 10 MG PO TABS: ORAL_TABLET | ORAL | Status: AC

## 2023-06-07 NOTE — Progress Notes (Signed)
Physical Therapy Treatment Patient Details Name: Shondell Mandato MRN: [id number] DOB: Feb 19, 1956 Today's Date: 06/07/2023   History of Present Illness Pt is a 67 y/o F admitted on 06/03/23. Pt was diagnosed with COVID last week. Pt presented to the ED with c/o worsening SOB, cough, wheezing. Pt is being treated for acute exacerbation of COPD likely triggered by recent viral infection. PMH: COPD, chronic respiratory failure on home O2, tracheostomy, HLD, essential HTN, GERD, asthma, CA    PT Comments  General Comments: AxO x 3 pleasant and willing but with MAX c/ fatigue/weakness "from the COVID" stated pt.   Assisted OOB was difficult.  General bed mobility comments: required increased time and increased assist back to bed to support B LE due to fatigue. General transfer comment: increased time pt was able to transfer from elevated bed to [facility] 1/4 no AD with VC's to avoid using L UE (cast).  Required an extended rest break before attempting another transfer back to bed.  Required another seated rest break before attempting to lift B LE back onto bed.  Weak. General Gait Details: unable to attempt due to fatigue and c/o weakness Pt will need ST Rehab at SNF to address mobility and functional decline prior to safely returning home.    If plan is discharge home, recommend the following: A little help with walking and/or transfers;A little help with bathing/dressing/bathroom   Can travel by private vehicle     No  Equipment Recommendations  None recommended by PT    Recommendations for Other Services       Precautions / Restrictions Precautions Precautions: Fall Precaution Comments: Home oxygen Restrictions Weight Bearing Restrictions: Yes LUE Weight Bearing: Non weight bearing Other Position/Activity Restrictions: pt with red cast on L wrist, per chart, pt NWB as of 05/16/23 with f/u scheduled in 3 weeks     Mobility  Bed Mobility Overal bed mobility: Needs Assistance Bed Mobility:  Supine to Sit, Sit to Supine     Supine to sit: Supervision, HOB elevated, Used rails Sit to supine: Supervision, Contact guard assist   General bed mobility comments: required increased time and increased assist back to bed to support B LE due to fatigue.    Transfers Overall transfer level: Needs assistance Equipment used: None Transfers: Bed to chair/wheelchair/BSC Sit to Stand: Contact guard assist, Min assist           General transfer comment: increased time pt was able to transfer from elevated bed to [facility] 1/4 no AD with VC's to avoid using L UE (cast).  Required an extended rest break before attempting another transfer back to bed.  Required another seated rest break before attempting to lift B LE back onto bed.  Weak.    Ambulation/Gait               General Gait Details: unable to attempt due to fatigue and c/o weakness   Stairs             Wheelchair Mobility     Tilt Bed    Modified Rankin (Stroke Patients Only)       Balance                                            Cognition Arousal: Alert Behavior During Therapy: WFL for tasks assessed/performed Overall Cognitive Status: Within Functional Limits for tasks assessed  General Comments: AxO x 3 pleasant and willing but with MAX c/ fatigue/weakness "from the COVID" stated pt.        Exercises      General Comments        Pertinent Vitals/Pain Pain Assessment Pain Assessment: Faces Faces Pain Scale: Hurts a little bit Pain Location: general Pain Descriptors / Indicators: Aching Pain Intervention(s): Monitored during session    Home Living                          Prior Function            PT Goals (current goals can now be found in the care plan section) Progress towards PT goals: Progressing toward goals    Frequency    Min 1X/week      PT Plan      Co-evaluation               AM-PAC PT "6 Clicks" Mobility   Outcome Measure  Help needed turning from your back to your side while in a flat bed without using bedrails?: A Little Help needed moving from lying on your back to sitting on the side of a flat bed without using bedrails?: A Little Help needed moving to and from a bed to a chair (including a wheelchair)?: A Little Help needed standing up from a chair using your arms (e.g., wheelchair or bedside chair)?: A Little Help needed to walk in hospital room?: A Lot Help needed climbing 3-5 steps with a railing? : Total 6 Click Score: 15    End of Session Equipment Utilized During Treatment: Gait belt;Oxygen Activity Tolerance: Patient limited by fatigue Patient left: in bed;with call bell/phone within reach;with bed alarm set Nurse Communication: Mobility status PT Visit Diagnosis: Muscle weakness (generalized) (M62.81);Difficulty in walking, not elsewhere classified (R26.2);Other abnormalities of gait and mobility (R26.89);Unsteadiness on feet (R26.81)     Time: 1047-1101 PT Time Calculation (min) (ACUTE ONLY): 14 min  Charges:    $Therapeutic Activity: 8-22 mins PT General Charges $$ ACUTE PT VISIT: 1 Visit                     Felecia Shelling  PTA Acute  Rehabilitation Services Office M-F          [phone]

## 2023-06-07 NOTE — Care Management Important Message (Signed)
Important Message  Patient Details IM Letter given. Name: Emeri Schimanski MRN: [id number] Date of Birth: 1955-10-17   Important Message Given:  Yes - Medicare IM     Caren Macadam 06/07/2023, 10:34 AM

## 2023-06-07 NOTE — TOC Progression Note (Addendum)
Transition of Care [facility]) - Progression Note    Patient Details  Name: Bianca Murray MRN: [id number] Date of Birth: October 02, 1955  Transition of Care [facility]) CM/SW Contact  Charish Schroepfer, Olegario Messier, RN Phone Number: 06/07/2023, 10:00 AM  Clinical Narrative:  Noted Berkley Harvey pending awaiting auth for [facility] pending Donalda Ewings.MD updated.  -1:49p checked on status of auth-pending med review.Received message from [facility] rep Bjorn Loser can accept patient back under medicaid LTC;awaiting rm#,report tel#, & ok if billing medicaid will d/c. MD updated. -1:52p-per [facility] agree to bill medicaid LTC-going to rm#[id number], report tel#[phone]. Await d/c summary, then call [facility]. -2:10p-[facility] called. MD/nsg updated. No further CM needs.    Expected Discharge Plan: Skilled Nursing Facility Barriers to Discharge: Insurance Authorization  Expected Discharge Plan and Services In-house Referral: NA Discharge Planning Services: NA   Living arrangements for the past 2 months: Skilled Nursing Facility Expected Discharge Date: 06/07/23               DME Arranged: N/A         HH Arranged:  (NA)           Social Determinants of Health (SDOH) Interventions SDOH Screenings   Food Insecurity: No Food Insecurity (06/05/2023)  Housing: Low Risk  (06/05/2023)  Transportation Needs: No Transportation Needs (06/05/2023)  Utilities: Not At Risk (06/05/2023)  Alcohol Screen: Low Risk  (02/05/2023)  Depression (PHQ2-9): Low Risk  (02/05/2023)  Financial Resource Strain: Low Risk  (02/05/2023)  Physical Activity: Inactive (02/05/2023)  Social Connections: Socially Isolated (02/05/2023)  Stress: Stress Concern Present (02/05/2023)  Tobacco Use: Medium Risk (06/03/2023)    Readmission Risk Interventions     No data to display

## 2023-06-07 NOTE — Discharge Summary (Signed)
Physician Discharge Summary  Bianca Murray OZD:[id number] DOB: Jan 03, 1956 DOA: 06/03/2023  PCP: Patient, No Pcp Per  Admit date: 06/03/2023 Discharge date: 06/07/2023 Recommendations for Outpatient Follow-up:  Follow up with PCP in 1 weeks-call for appointment Please obtain BMP/CBC in one week  Discharge Dispo: SNF Discharge Condition: Stable Code Status:   Code Status: Full Code Diet recommendation:  Diet Order             Diet regular Room service appropriate? Yes; Fluid consistency: Thin  Diet effective now                    Brief/Interim Summary: 67 year old female with history of COPD chronic hypoxic respiratory failure on 4L [place], anxiety, arthritis, history of tracheostomy, HLD, HTN, GERD ambulatory dysfunction who had recent COVID positive at a SNF on 05/29/2023 and having shortness of breath coughing has been sent to the ED with progressively worsening symptoms with shortness of breath. Patient stated symptoms onset was on 10/12-10/13.  In the ED,oxygen saturation stable on 4 L  home setting, afebrile at times tachycardic Labs shows chronic anemia hb-10.5 g stable renal function LFTs COVID-positive again, influenza negative.Chest x-ray stable. CT angio chest>>no PE, bronchial wall thickening bilaterally with strandy opacities at lung bases-possible atelectasis or infiltrate at the base, emphysema and aortic atherosclerosis noticed Patient was admitted for further management of copd exacerbation in the setting of COVID. Patient was managed for acute exacerbation of COPD and she has clinically improved she will be discharged on oral steroid taper.   Discharge Diagnoses:  Principal Problem:   Acute exacerbation of chronic obstructive pulmonary disease (COPD) (HCC) Active Problems:   COPD with acute exacerbation (HCC)   Hypertension   GERD (gastroesophageal reflux disease)   Generalized anxiety disorder   Mixed hyperlipidemia   Dependence on supplemental oxygen    History of COVID-19  Acute COPD exacerbation in the setting of COVID infection: Patient is slow to improve has bilateral diminished breath sounds -but overall air entry improving, no shortness of breath, coughing controlled on antitussives.  Continue on prednisone and continue taper continue doxycycline, bronchodilators alogn with  Brovana/Yupelri/Pulmicort and antitussives.     COVID-positive 10/16: Patient reports her symptom onset from past weekend :10/12-13. Patient has completed antiviral PTA. Continue symptomatic management  as above.     GERD: on ppi   HLD: cont Lipitor   HTN: BP stable on Cardizem   QVZ:DGLOVFI endorsing anxiety symptoms but overall feeling much better on Neurontin, Zoloft BuSpar  and Xanax    Ambulatory dysfunction: cont PT OT as able  Consults: none Subjective: Alert awake oriented no nausea vomiting chest pain fever chills. Doing well on home oxygen setting  Discharge Exam: Vitals:   06/07/23 0828 06/07/23 1256  BP: (!) 151/83 112/74  Pulse: 75 88  Resp:  18  Temp:  98.4 F (36.9 C)  SpO2:  99%   General: Pt is alert, awake, not in acute distress Cardiovascular: RRR, S1/S2 +, no rubs, no gallops Respiratory: CTA bilaterally, no wheezing, no rhonchi Abdominal: Soft, NT, ND, bowel sounds + Extremities: no edema, no cyanosis  Discharge Instructions  Discharge Instructions     Discharge instructions   Complete by: As directed    Please call call MD or return to ER for similar or worsening recurring problem that brought you to hospital or if any fever,nausea/vomiting,abdominal pain, uncontrolled pain, chest pain,  shortness of breath or any other alarming symptoms.  Please follow-up your doctor as instructed in a  week time and call the office for appointment.  Please avoid alcohol, smoking, or any other illicit substance and maintain healthy habits including taking your regular medications as prescribed.  You were cared for by a hospitalist  during your hospital stay. If you have any questions about your discharge medications or the care you received while you were in the hospital after you are discharged, you can call the unit and ask to speak with the hospitalist on call if the hospitalist that took care of you is not available.  Once you are discharged, your primary care physician will handle any further medical issues. Please note that NO REFILLS for any discharge medications will be authorized once you are discharged, as it is imperative that you return to your primary care physician (or establish a relationship with a primary care physician if you do not have one) for your aftercare needs so that they can reassess your need for medications and monitor your lab values   Increase activity slowly   Complete by: As directed       Allergies as of 06/07/2023       Reactions   Prednisone Anxiety, Other (See Comments)   Made the patient feel very unwell        Medication List     STOP taking these medications    PAXLOVID (300/100) PO       TAKE these medications    acetaminophen 500 MG tablet Commonly known as: TYLENOL Take 1,000 mg by mouth 3 (three) times daily.   albuterol 108 (90 Base) MCG/ACT inhaler Commonly known as: VENTOLIN HFA Inhale 2 puffs into the lungs every 6 (six) hours as needed for wheezing or shortness of breath.   atorvastatin 10 MG tablet Commonly known as: LIPITOR Take 1 tablet (10 mg total) by mouth daily.   benzonatate 100 MG capsule Commonly known as: TESSALON Take 1 capsule (100 mg total) by mouth 3 (three) times daily as needed for cough.   budesonide 0.5 MG/2ML nebulizer solution Commonly known as: PULMICORT Take 2 mLs (0.5 mg total) by nebulization 2 (two) times daily.   busPIRone 15 MG tablet Commonly known as: BUSPAR Take 15 mg by mouth 3 (three) times daily. What changed: Another medication with the same name was removed. Continue taking this medication, and follow the  directions you see here.   cetirizine 10 MG tablet Commonly known as: ZYRTEC Take 10 mg by mouth daily.   chlorpheniramine-HYDROcodone 10-8 MG/5ML Commonly known as: TUSSIONEX Take 5 mLs by mouth every 12 (twelve) hours as needed for up to 5 days for cough.   diltiazem 120 MG 24 hr capsule Commonly known as: CARDIZEM CD Take 1 capsule (120 mg total) by mouth daily.   doxycycline 100 MG tablet Commonly known as: VIBRA-TABS Take 1 tablet (100 mg total) by mouth every 12 (twelve) hours for 3 days.   ferrous sulfate 325 (65 FE) MG tablet Take 325 mg by mouth daily.   fluticasone 50 MCG/ACT nasal spray Commonly known as: FLONASE Place 2 sprays into both nostrils daily.   formoterol 20 MCG/2ML nebulizer solution Commonly known as: Perforomist Take 2 mLs (20 mcg total) by nebulization 2 (two) times daily.   gabapentin 100 MG capsule Commonly known as: NEURONTIN Take 1 capsule (100 mg total) by mouth 3 (three) times daily.   guaiFENesin 100 MG/5ML liquid Commonly known as: ROBITUSSIN Take 5 mLs by mouth every 6 (six) hours as needed for cough.   hydrOXYzine 25 MG tablet Commonly  known as: ATARAX TAKE 1 TABLET (25 MG TOTAL) BY MOUTH 3 (THREE) TIMES DAILY AS NEEDED FOR ANXIETY. What changed: when to take this   ipratropium-albuterol 0.5-2.5 (3) MG/3ML Soln Commonly known as: DUONEB Take 3 mLs by nebulization 4 (four) times daily.   nystatin powder Apply 1 Application topically every 8 (eight) hours as needed (wound care). Apply under breast   OXYGEN Inhale 4-5 L/min into the lungs continuous.   pantoprazole 40 MG tablet Commonly known as: PROTONIX TAKE 1 TABLET BY MOUTH 2 TIMES DAILY BEFORE A MEAL (AM+EVENING) What changed: See the new instructions.   predniSONE 10 MG tablet Commonly known as: DELTASONE Take PO 4 tabs daily x 3 days,3 tabs daily x 3 days,2 tabs daily x 3 days,1 tab daily x 2 days then stop.   Sertraline HCl 150 MG Caps Take 150 mg by mouth daily.    sodium chloride 0.65 % Soln nasal spray Commonly known as: OCEAN Place 2 sprays into both nostrils as needed for congestion.   Systane 0.4-0.3 % Soln Generic drug: Polyethyl Glycol-Propyl Glycol Place 1 drop into the right eye in the morning, at noon, in the evening, and at bedtime.   traMADol 50 MG tablet Commonly known as: ULTRAM Take 1 tablet (50 mg total) by mouth every 4 (four) hours as needed for up to 4 doses.   Vitamin D (Ergocalciferol) 1.25 MG (50000 UNIT) Caps capsule Commonly known as: DRISDOL Take 1 capsule (50,000 Units total) by mouth every 7 (seven) days. What changed: when to take this   Yupelri 175 MCG/3ML nebulizer solution Generic drug: revefenacin Take 3 mLs (175 mcg total) by nebulization daily.        Contact information for after-discharge care     Destination     [facility] Preferred SNF .   Service: Skilled Nursing Contact information: [address] [place] [address] [phone]                    Allergies  Allergen Reactions   Prednisone Anxiety and Other (See Comments)    Made the patient feel very unwell    The results of significant diagnostics from this hospitalization (including imaging, microbiology, ancillary and laboratory) are listed below for reference.    Microbiology: Recent Results (from the past 240 hour(s))  Resp panel by RT-PCR (RSV, Flu A&B, Covid) Anterior Nasal Swab     Status: Abnormal   Collection Time: 06/04/23 12:40 AM   Specimen: Anterior Nasal Swab  Result Value Ref Range Status   SARS Coronavirus 2 by RT PCR POSITIVE (A) NEGATIVE Final    Comment: (NOTE) SARS-CoV-2 target nucleic acids are DETECTED.  The SARS-CoV-2 RNA is generally detectable in upper respiratory specimens during the acute phase of infection. Positive results are indicative of the presence of the identified virus, but do not rule out bacterial infection or co-infection with  other pathogens not detected by the test. Clinical correlation with patient history and other diagnostic information is necessary to determine patient infection status. The expected result is Negative.  Fact Sheet for Patients: BloggerCourse.com  Fact Sheet for Healthcare Providers: SeriousBroker.it  This test is not yet approved or cleared by the [place] FDA and  has been authorized for detection and/or diagnosis of SARS-CoV-2 by FDA under an Emergency Use Authorization (EUA).  This EUA will remain in effect (meaning this test can be used) for the duration of  the COVID-19 declaration under Section 564(b)(1) of the A ct,  21 U.S.C. section 360bbb-3(b)(1), unless the authorization is terminated or revoked sooner.     Influenza A by PCR NEGATIVE NEGATIVE Final   Influenza B by PCR NEGATIVE NEGATIVE Final    Comment: (NOTE) The Xpert Xpress SARS-CoV-2/FLU/RSV plus assay is intended as an aid in the diagnosis of influenza from Nasopharyngeal swab specimens and should not be used as a sole basis for treatment. Nasal washings and aspirates are unacceptable for Xpert Xpress SARS-CoV-2/FLU/RSV testing.  Fact Sheet for Patients: BloggerCourse.com  Fact Sheet for Healthcare Providers: SeriousBroker.it  This test is not yet approved or cleared by the [place] FDA and has been authorized for detection and/or diagnosis of SARS-CoV-2 by FDA under an Emergency Use Authorization (EUA). This EUA will remain in effect (meaning this test can be used) for the duration of the COVID-19 declaration under Section 564(b)(1) of the Act, 21 U.S.C. section 360bbb-3(b)(1), unless the authorization is terminated or revoked.     Resp Syncytial Virus by PCR NEGATIVE NEGATIVE Final    Comment: (NOTE) Fact Sheet for Patients: BloggerCourse.com  Fact Sheet for Healthcare  Providers: SeriousBroker.it  This test is not yet approved or cleared by the [place] FDA and has been authorized for detection and/or diagnosis of SARS-CoV-2 by FDA under an Emergency Use Authorization (EUA). This EUA will remain in effect (meaning this test can be used) for the duration of the COVID-19 declaration under Section 564(b)(1) of the Act, 21 U.S.C. section 360bbb-3(b)(1), unless the authorization is terminated or revoked.  Performed at [facility], [address] [place], [place] [address]     Procedures/Studies: CT Angio Chest PE W and/or Wo Contrast  Result Date: 06/04/2023 CLINICAL DATA:  Pulmonary embolism suspected, high probability. Progressively worsening shortness of breath. Positive COVID test on 05/29/2023. History of COPD. EXAM: CT ANGIOGRAPHY CHEST WITH CONTRAST TECHNIQUE: Multidetector CT imaging of the chest was performed using the standard protocol during bolus administration of intravenous contrast. Multiplanar CT image reconstructions and MIPs were obtained to evaluate the vascular anatomy. RADIATION DOSE REDUCTION: This exam was performed according to the departmental dose-optimization program which includes automated exposure control, adjustment of the mA and/or kV according to patient size and/or use of iterative reconstruction technique. CONTRAST:  75mL OMNIPAQUE IOHEXOL 350 MG/ML SOLN COMPARISON:  None Available. FINDINGS: Cardiovascular: The heart is normal in size and there is no pericardial effusion. There is atherosclerotic calcification of the aorta without evidence of aneurysm. The pulmonary trunk is normal in caliber. No evidence of pulmonary embolism. Mediastinum/Nodes: No mediastinal, hilar, or axillary lymphadenopathy. The thyroid gland and esophagus are within normal limits. Small hiatal hernia is noted. There is collapse of the mid trachea, which may be associated with tracheomalacia. Lungs/Pleura:  Emphysematous changes are present in the lungs. Bronchial wall thickening is noted bilaterally, most pronounced in the right lower lobe. Strandy airspace disease is noted in the lingular segment of the left upper lobe, right middle lobe, and lower lobes bilaterally. No effusion or pneumothorax. Upper Abdomen: No acute abnormality. Musculoskeletal: No acute osseous abnormality. Review of the MIP images confirms the above findings. IMPRESSION: 1. No evidence of pulmonary embolism. 2. Bronchial wall thickening bilaterally with strandy opacities at the lung bases, possible atelectasis or infiltrate. 3. Emphysema. 4. Aortic atherosclerosis. Electronically Signed   By: Thornell Sartorius M.D.   On: 06/04/2023 00:07   DG Chest Port 1 View  Result Date: 06/03/2023 CLINICAL DATA:  Shortness of breath, tested positive for COVID on 10/16. EXAM: PORTABLE CHEST 1  VIEW COMPARISON:  04/18/2023. FINDINGS: The heart size and mediastinal contours are within normal limits. There is atherosclerotic calcification of the aorta. Subsegmental atelectasis or scarring is noted in the mid lungs bilaterally. No consolidation, effusion, or pneumothorax. No acute osseous abnormality. IMPRESSION: Stable chest with no active disease. Electronically Signed   By: Thornell Sartorius M.D.   On: 06/03/2023 22:20    Labs: BNP (last 3 results) Recent Labs    06/03/23 1940  BNP 29.0   Basic Metabolic Panel: Recent Labs  Lab 06/03/23 1939 06/04/23 0644 06/05/23 0839 06/06/23 0456 06/07/23 0522  NA 141 139 139 136 141  K 3.9 4.2 4.8 4.1 4.4  CL 98 97* 98 96* 102  CO2 33* 33* 32 30 30  GLUCOSE 103* 162* 137* 121* 129*  BUN 9 9 15 21 19   CREATININE 0.72 0.61 0.72 0.70 0.65  CALCIUM 8.9 9.3 9.3 9.2 9.1  MG  --   --   --  2.1 1.9  PHOS  --   --   --  3.7 3.1   Liver Function Tests: Recent Labs  Lab 06/04/23 0644  AST 12*  ALT 12  ALKPHOS 59  BILITOT 0.4  PROT 7.0  ALBUMIN 3.4*   No results for input(s): "LIPASE", "AMYLASE" in  the last 168 hours. No results for input(s): "AMMONIA" in the last 168 hours. CBC: Recent Labs  Lab 06/03/23 1939 06/05/23 0839 06/06/23 0456 06/07/23 0522  WBC 4.9 11.3* 10.4 11.0*  NEUTROABS 2.5  --   --   --   HGB 10.5* 10.9* 10.7* 10.3*  HCT 35.5* 36.5 35.5* 35.4*  MCV 94.7 93.6 93.9 95.7  PLT 339 417* 380 365  No results for input(s): "VITAMINB12", "FOLATE", "FERRITIN", "TIBC", "IRON", "RETICCTPCT" in the last 72 hours. Urinalysis No results found for: "COLORURINE", "APPEARANCEUR", "LABSPEC", "PHURINE", "GLUCOSEU", "HGBUR", "BILIRUBINUR", "KETONESUR", "PROTEINUR", "UROBILINOGEN", "NITRITE", "LEUKOCYTESUR" Sepsis Labs Recent Labs  Lab 06/03/23 1939 06/05/23 0839 06/06/23 0456 06/07/23 0522  WBC 4.9 11.3* 10.4 11.0*   Microbiology Recent Results (from the past 240 hour(s))  Resp panel by RT-PCR (RSV, Flu A&B, Covid) Anterior Nasal Swab     Status: Abnormal   Collection Time: 06/04/23 12:40 AM   Specimen: Anterior Nasal Swab  Result Value Ref Range Status   SARS Coronavirus 2 by RT PCR POSITIVE (A) NEGATIVE Final    Comment: (NOTE) SARS-CoV-2 target nucleic acids are DETECTED.  The SARS-CoV-2 RNA is generally detectable in upper respiratory specimens during the acute phase of infection. Positive results are indicative of the presence of the identified virus, but do not rule out bacterial infection or co-infection with other pathogens not detected by the test. Clinical correlation with patient history and other diagnostic information is necessary to determine patient infection status. The expected result is Negative.  Fact Sheet for Patients: BloggerCourse.com  Fact Sheet for Healthcare Providers: SeriousBroker.it  This test is not yet approved or cleared by the [place] FDA and  has been authorized for detection and/or diagnosis of SARS-CoV-2 by FDA under an Emergency Use Authorization (EUA).  This EUA  will remain in effect (meaning this test can be used) for the duration of  the COVID-19 declaration under Section 564(b)(1) of the A ct, 21 U.S.C. section 360bbb-3(b)(1), unless the authorization is terminated or revoked sooner.     Influenza A by PCR NEGATIVE NEGATIVE Final   Influenza B by PCR NEGATIVE NEGATIVE Final    Comment: (NOTE) The Xpert Xpress SARS-CoV-2/FLU/RSV plus assay is intended as  an aid in the diagnosis of influenza from Nasopharyngeal swab specimens and should not be used as a sole basis for treatment. Nasal washings and aspirates are unacceptable for Xpert Xpress SARS-CoV-2/FLU/RSV testing.  Fact Sheet for Patients: BloggerCourse.com  Fact Sheet for Healthcare Providers: SeriousBroker.it  This test is not yet approved or cleared by the [place] FDA and has been authorized for detection and/or diagnosis of SARS-CoV-2 by FDA under an Emergency Use Authorization (EUA). This EUA will remain in effect (meaning this test can be used) for the duration of the COVID-19 declaration under Section 564(b)(1) of the Act, 21 U.S.C. section 360bbb-3(b)(1), unless the authorization is terminated or revoked.     Resp Syncytial Virus by PCR NEGATIVE NEGATIVE Final    Comment: (NOTE) Fact Sheet for Patients: BloggerCourse.com  Fact Sheet for Healthcare Providers: SeriousBroker.it  This test is not yet approved or cleared by the [place] FDA and has been authorized for detection and/or diagnosis of SARS-CoV-2 by FDA under an Emergency Use Authorization (EUA). This EUA will remain in effect (meaning this test can be used) for the duration of the COVID-19 declaration under Section 564(b)(1) of the Act, 21 U.S.C. section 360bbb-3(b)(1), unless the authorization is terminated or revoked.  Performed at [facility], [address] [place],  [place] [address]    Time coordinating discharge: 35 minutes  SIGNED: Lanae Boast, MD  [facility] 06/07/2023, 2:03 PM  If 7PM-7AM, please contact night-coverage [email or web address]

## 2023-06-07 NOTE — Progress Notes (Signed)
Patient received discharge orders to go back to [facility] SNF. Patient was given discharge paperwork/instructions. RN not only gave report to the RN over at [facility] who will be receiving the patient, but RN also went over discharge paperwork/instructions with the patient. All questions/concerns were addressed/answered to the best of RN's ability. Discharge packet was completed. PIV was taken out. [facility] was called.   At 1741 [facility] came to pick up patient to take patient to [facility]. Patient left the hospital stable, had discharge paperwork/instructions/discharge packet, and had all personal belongings. Patient's daughter was notified that [facility] came to pick patient up to take patient to [facility].

## 2023-06-07 NOTE — Plan of Care (Signed)

## 2023-06-07 NOTE — Progress Notes (Deleted)
PROGRESS NOTE Bianca Murray  WGN:[id number] DOB: [birth year] DOA: 06/03/2023 PCP: Patient, No Pcp Per  Brief Narrative/Hospital Course: 67 year old female with history of COPD chronic hypoxic respiratory failure on 4L [place], anxiety, arthritis, history of tracheostomy, HLD, HTN, GERD ambulatory dysfunction who had recent COVID positive at a SNF on 05/29/2023 and having shortness of breath coughing has been sent to the ED with progressively worsening symptoms with shortness of breath. Patient stated symptoms onset was on 10/12-10/13.  In the ED,oxygen saturation stable on 4 L [place] home setting, afebrile at times tachycardic Labs shows chronic anemia hb-10.5 g stable renal function LFTs COVID-positive again, influenza negative.Chest x-ray stable. CT angio chest>>no PE, bronchial wall thickening bilaterally with strandy opacities at lung bases-possible atelectasis or infiltrate at the base, emphysema and aortic atherosclerosis noticed Patient was admitted for further management of copd exacerbation in the setting of COVID  Subjective: Patient seen and examined  She is alert awake resting comfortably breathing much better less coughing episodes  On 4 L nasal cannula home setting   Assessment and Plan: Principal Problem:   Acute exacerbation of chronic obstructive pulmonary disease (COPD) (HCC) Active Problems:   COPD with acute exacerbation (HCC)   Hypertension   GERD (gastroesophageal reflux disease)   Generalized anxiety disorder   Mixed hyperlipidemia   Dependence on supplemental oxygen   History of COVID-19   Acute COPD exacerbation in the setting of COVID infection: Patient is slow to improve has bilateral diminished breath sounds -but overall air entry improving, no shortness of breath, coughing controlled on antitussives.  Continue on prednisone and continue taper continue doxycycline, bronchodilators alogn with  Brovana/Yupelri/Pulmicort and antitussives.     COVID-positive  10/16: Patient reports her symptom onset from past weekend :10/12-13. Patient has completed antiviral PTA. Continue symptomatic management  as above.     GERD: on ppi   HLD: cont Lipitor   HTN: BP stable on Cardizem   HQI:ONGEXBM endorsing anxiety symptoms but overall feeling much better on Neurontin, Zoloft BuSpar  and Xanax    Ambulatory dysfunction: cont PT OT as able  DVT prophylaxis: enoxaparin (LOVENOX) injection 40 mg Start: 06/04/23 1000 SCDs Start: 06/04/23 8413 Code Status:   Code Status: Full Code Family Communication: plan of care discussed with patient at bedside . daughter was updated on  admission  Patient status is: Inpatient because of COPD exacerbation Level of care: Telemetry   Dispo: The patient is from: SNF            Anticipated disposition: Discharge back to skilled nursing facility [facility] awaiting for SNF.  She is medically stable for discharge   Objective: Vitals last 24 hrs: Vitals:   06/07/23 0615 06/07/23 0806 06/07/23 0828 06/07/23 1256  BP: 128/77  (!) 151/83 112/74  Pulse: 81  75 88  Resp: 18   18  Temp: 98 F (36.7 C)   98.4 F (36.9 C)  TempSrc: Oral   Oral  SpO2: 98% 100%  99%  Weight:      Height:       Weight change: 1.1 kg  Physical Examination: General exam: alert awake, oriented at baseline, older than stated age HEENT:Oral mucosa moist, Ear/Nose WNL grossly Respiratory system: Bilaterally diminished breath sounds with air entry present mild wheezing but mostly coming from the upper airways  Cardiovascular system: S1 & S2 +, No JVD. Gastrointestinal system: Abdomen soft,NT,ND, BS+ Nervous System: Alert, awake, moving all extremities,and following commands. Extremities: LE edema neg,distal peripheral pulses palpable and warm.  Skin:  No rashes,no icterus. MSK: Normal muscle bulk,tone, power   Medications reviewed: Scheduled Meds:  arformoterol  15 mcg Nebulization Q12H   atorvastatin  10 mg Oral Daily   budesonide  0.5  mg Nebulization BID   busPIRone  15 mg Oral TID   diltiazem  180 mg Oral Daily   doxycycline  100 mg Oral Q12H   enoxaparin (LOVENOX) injection  40 mg Subcutaneous Q24H   feeding supplement  237 mL Oral TID BM   ferrous sulfate  325 mg Oral Daily   fluticasone  2 spray Each Nare Daily   gabapentin  100 mg Oral TID   loratadine  10 mg Oral Daily   multivitamin with minerals  1 tablet Oral Daily   pantoprazole  40 mg Oral BID AC   predniSONE  40 mg Oral Q breakfast   revefenacin  175 mcg Nebulization Daily   sertraline  150 mg Oral Daily   Continuous Infusions:    Diet Order             Diet regular Room service appropriate? Yes; Fluid consistency: Thin  Diet effective now                  Intake/Output Summary (Last 24 hours) at 06/07/2023 1305 Last data filed at 06/07/2023 0300 Gross per 24 hour  Intake 360 ml  Output --  Net 360 ml   Net IO Since Admission: 120 mL [06/07/23 1305]  Wt Readings from Last 3 Encounters:  06/06/23 89.6 kg  04/18/23 94.3 kg  02/05/23 94.3 kg     Unresulted Labs (From admission, onward)     Start     Ordered   06/11/23 0500  Creatinine, serum  (enoxaparin (LOVENOX)    CrCl >/= 30 ml/min)  Weekly,   R     Comments: while on enoxaparin therapy    06/04/23 0622   06/06/23 0500  Magnesium  Daily,   R      06/05/23 1246   06/06/23 0500  Phosphorus  Daily,   R      06/05/23 1246   06/05/23 0500  CBC  Daily,   R      06/04/23 1220   06/05/23 0500  Basic metabolic panel  Daily,   R      06/04/23 1220          Data Reviewed: I have personally reviewed following labs and imaging studies CBC: Recent Labs  Lab 06/03/23 1939 06/05/23 0839 06/06/23 0456 06/07/23 0522  WBC 4.9 11.3* 10.4 11.0*  NEUTROABS 2.5  --   --   --   HGB 10.5* 10.9* 10.7* 10.3*  HCT 35.5* 36.5 35.5* 35.4*  MCV 94.7 93.6 93.9 95.7  PLT 339 417* 380 365   Basic Metabolic Panel: Recent Labs  Lab 06/03/23 1939 06/04/23 0644 06/05/23 0839  06/06/23 0456 06/07/23 0522  NA 141 139 139 136 141  K 3.9 4.2 4.8 4.1 4.4  CL 98 97* 98 96* 102  CO2 33* 33* 32 30 30  GLUCOSE 103* 162* 137* 121* 129*  BUN 9 9 15 21 19   CREATININE 0.72 0.61 0.72 0.70 0.65  CALCIUM 8.9 9.3 9.3 9.2 9.1  MG  --   --   --  2.1 1.9  PHOS  --   --   --  3.7 3.1   GFR: Estimated Creatinine Clearance: 71.5 mL/min (by C-G formula based on SCr of 0.65 mg/dL). Liver Function Tests: Recent Labs  Lab 06/04/23  0644  AST 12*  ALT 12  ALKPHOS 59  BILITOT 0.4  PROT 7.0  ALBUMIN 3.4*  Sepsis Labs: No results for input(s): "PROCALCITON", "LATICACIDVEN" in the last 168 hours.  Recent Results (from the past 240 hour(s))  Resp panel by RT-PCR (RSV, Flu A&B, Covid) Anterior Nasal Swab     Status: Abnormal   Collection Time: 06/04/23 12:40 AM   Specimen: Anterior Nasal Swab  Result Value Ref Range Status   SARS Coronavirus 2 by RT PCR POSITIVE (A) NEGATIVE Final    Comment: (NOTE) SARS-CoV-2 target nucleic acids are DETECTED.  The SARS-CoV-2 RNA is generally detectable in upper respiratory specimens during the acute phase of infection. Positive results are indicative of the presence of the identified virus, but do not rule out bacterial infection or co-infection with other pathogens not detected by the test. Clinical correlation with patient history and other diagnostic information is necessary to determine patient infection status. The expected result is Negative.  Fact Sheet for Patients: BloggerCourse.com  Fact Sheet for Healthcare Providers: SeriousBroker.it  This test is not yet approved or cleared by the [place] FDA and  has been authorized for detection and/or diagnosis of SARS-CoV-2 by FDA under an Emergency Use Authorization (EUA).  This EUA will remain in effect (meaning this test can be used) for the duration of  the COVID-19 declaration under Section 564(b)(1) of the A ct,  21 U.S.C. section 360bbb-3(b)(1), unless the authorization is terminated or revoked sooner.     Influenza A by PCR NEGATIVE NEGATIVE Final   Influenza B by PCR NEGATIVE NEGATIVE Final    Comment: (NOTE) The Xpert Xpress SARS-CoV-2/FLU/RSV plus assay is intended as an aid in the diagnosis of influenza from Nasopharyngeal swab specimens and should not be used as a sole basis for treatment. Nasal washings and aspirates are unacceptable for Xpert Xpress SARS-CoV-2/FLU/RSV testing.  Fact Sheet for Patients: BloggerCourse.com  Fact Sheet for Healthcare Providers: SeriousBroker.it  This test is not yet approved or cleared by the [place] FDA and has been authorized for detection and/or diagnosis of SARS-CoV-2 by FDA under an Emergency Use Authorization (EUA). This EUA will remain in effect (meaning this test can be used) for the duration of the COVID-19 declaration under Section 564(b)(1) of the Act, 21 U.S.C. section 360bbb-3(b)(1), unless the authorization is terminated or revoked.     Resp Syncytial Virus by PCR NEGATIVE NEGATIVE Final    Comment: (NOTE) Fact Sheet for Patients: BloggerCourse.com  Fact Sheet for Healthcare Providers: SeriousBroker.it  This test is not yet approved or cleared by the [place] FDA and has been authorized for detection and/or diagnosis of SARS-CoV-2 by FDA under an Emergency Use Authorization (EUA). This EUA will remain in effect (meaning this test can be used) for the duration of the COVID-19 declaration under Section 564(b)(1) of the Act, 21 U.S.C. section 360bbb-3(b)(1), unless the authorization is terminated or revoked.  Performed at [facility], [address] [place], [place] [address]     Antimicrobials: Anti-infectives (From admission, onward)    Start     Dose/Rate Route Frequency Ordered Stop    06/07/23 0000  doxycycline (VIBRA-TABS) 100 MG tablet        100 mg Oral Every 12 hours 06/07/23 0925 06/10/23 2359   06/04/23 1000  doxycycline (VIBRA-TABS) tablet 100 mg        100 mg Oral Every 12 hours 06/04/23 0622 06/09/23 0959      Culture/Microbiology No results found for: "  SDES", "SPECREQUEST", "CULT", "REPTSTATUS"   Radiology Studies: No results found.   LOS: 3 days   Lanae Boast, MD [facility]  06/07/2023, 1:05 PM

## 2023-07-22 ENCOUNTER — Telehealth: Payer: Self-pay | Admitting: Critical Care Medicine

## 2023-07-22 NOTE — Telephone Encounter (Signed)
Copied from CRM [id number]. Topic: General - Inquiry >> Jul 19, 2023  2:51 PM Patsy Lager T wrote: Reason for CRM: Pt states she is in a nursing home and is requesting virtual visit with Dr. Delford Field. Offered pt visit with different provider. Pt declined to see a different provider. Pt states she needs medication because the nursing home will not give her medication to help with the pain. Pt requesting call back to see if she could get a phone visit with Dr Delford Field. Please f/u with patient

## 2023-07-22 NOTE — Telephone Encounter (Signed)
Spoke with patient Daughter's  Bianca Murray . Advised Bianca Murray that Doctor Delford Field has retired and we do not have another Pulmonologist  in the office. Bianca Murray voiced that she understood. List of Pulmonologist which was obtained from Google search sent as my chart message to patient .

## 2023-07-29 ENCOUNTER — Telehealth: Payer: Self-pay | Admitting: General Practice

## 2023-07-29 NOTE — Telephone Encounter (Unsigned)
Copied from CRM [id number]. Topic: General - Inquiry >> Jul 29, 2023 11:03 AM Haroldine Laws wrote: Reason for CRM: Daughter Rushie Nyhan would like Jenel Lucks to call her regarding her mother.  [phone]

## 2023-07-29 NOTE — Telephone Encounter (Signed)
Copied from CRM [id number]. Topic: General - Inquiry >> Jul 29, 2023 11:03 AM Haroldine Laws wrote: Reason for CRM: Daughter Rushie Nyhan would like Jenel Lucks to call her regarding her mother.  [phone]

## 2023-09-03 ENCOUNTER — Other Ambulatory Visit: Payer: Self-pay

## 2023-09-03 ENCOUNTER — Emergency Department (HOSPITAL_COMMUNITY): Payer: 59

## 2023-09-03 ENCOUNTER — Emergency Department (HOSPITAL_COMMUNITY)
Admission: EM | Admit: 2023-09-03 | Discharge: 2023-09-03 | Disposition: A | Discharge status: Home / Self Care | Payer: 59

## 2023-09-03 ENCOUNTER — Encounter (HOSPITAL_COMMUNITY): Payer: Self-pay

## 2023-09-03 DIAGNOSIS — S52354A Nondisplaced comminuted fracture of shaft of radius, right arm, initial encounter for closed fracture: Secondary | ICD-10-CM | POA: Diagnosis not present

## 2023-09-03 DIAGNOSIS — W19XXXA Unspecified fall, initial encounter: Secondary | ICD-10-CM | POA: Insufficient documentation

## 2023-09-03 DIAGNOSIS — S52611A Displaced fracture of right ulna styloid process, initial encounter for closed fracture: Secondary | ICD-10-CM | POA: Diagnosis not present

## 2023-09-03 DIAGNOSIS — Z7951 Long term (current) use of inhaled steroids: Secondary | ICD-10-CM | POA: Diagnosis not present

## 2023-09-03 DIAGNOSIS — J449 Chronic obstructive pulmonary disease, unspecified: Secondary | ICD-10-CM | POA: Insufficient documentation

## 2023-09-03 DIAGNOSIS — S6991XA Unspecified injury of right wrist, hand and finger(s), initial encounter: Secondary | ICD-10-CM | POA: Diagnosis present

## 2023-09-03 MED ORDER — HYDROCODONE-ACETAMINOPHEN 5-325 MG PO TABS
1.0000 | ORAL_TABLET | Freq: Once | ORAL | Status: AC
  Administered 2023-09-03: 1 via ORAL
  Filled 2023-09-03: qty 1

## 2023-09-03 NOTE — Progress Notes (Signed)
Orthopedic Tech Progress Note Patient Details:  Bianca Murray 1956/07/14 [id number]  Ortho Devices Type of Ortho Device: Arm sling, Short arm splint Ortho Device/Splint Location: RUE/sugartong Ortho Device/Splint Interventions: Ordered, Application, Adjustment   Post Interventions Patient Tolerated: Well Instructions Provided: Adjustment of device  Tonye Pearson 09/03/2023, 9:00 PM

## 2023-09-03 NOTE — ED Triage Notes (Signed)
BIBA from [facility] mechanical fall on right wrist yesterday with swelling noted on arrival.  Denies blood thinner usage.

## 2023-09-03 NOTE — ED Provider Notes (Signed)
[facility] AT [facility] Provider Note   CSN: [id number] Arrival date & time: 09/03/23  1823     History  Chief Complaint  Patient presents with   Wrist Pain    Bianca Murray is a 68 y.o. female.   Wrist Pain  Pt presents to the ED post mechanical fall today, tripping on oxygen tubing. States she caught herself with right arm.  Denies any head trauma.  Fall was witnessed by roommate who said that she did not lose consciousness. Denies blood thinners. Denies numbness, tingling, weakness, immobility.       Home Medications Prior to Admission medications   Medication Sig Start Date End Date Taking? Authorizing Provider  acetaminophen (TYLENOL) 500 MG tablet Take 1,000 mg by mouth 3 (three) times daily.    [provider]  albuterol (VENTOLIN HFA) 108 (90 Base) MCG/ACT inhaler Inhale 2 puffs into the lungs every 6 (six) hours as needed for wheezing or shortness of breath. 12/04/22   Azucena Fallen, MD  atorvastatin (LIPITOR) 10 MG tablet Take 1 tablet (10 mg total) by mouth daily. Patient not taking: Reported on 06/04/2023 09/20/22   Storm Frisk, MD  benzonatate (TESSALON) 100 MG capsule Take 1 capsule (100 mg total) by mouth 3 (three) times daily as needed for cough. Patient not taking: Reported on 06/04/2023 03/21/23   Storm Frisk, MD  budesonide (PULMICORT) 0.5 MG/2ML nebulizer solution Take 2 mLs (0.5 mg total) by nebulization 2 (two) times daily. 11/22/22 06/04/23  Storm Frisk, MD  busPIRone (BUSPAR) 15 MG tablet Take 15 mg by mouth 3 (three) times daily. 05/07/23   [provider]  cetirizine (ZYRTEC) 10 MG tablet Take 10 mg by mouth daily.    [provider]  diltiazem (CARDIZEM CD) 120 MG 24 hr capsule Take 1 capsule (120 mg total) by mouth daily. 12/05/22   Azucena Fallen, MD  ferrous sulfate 325 (65 FE) MG tablet Take 325 mg by mouth daily.    [provider]  fluticasone (FLONASE)  50 MCG/ACT nasal spray Place 2 sprays into both nostrils daily. 12/04/22   Azucena Fallen, MD  formoterol (PERFOROMIST) 20 MCG/2ML nebulizer solution Take 2 mLs (20 mcg total) by nebulization 2 (two) times daily. 11/22/22   Storm Frisk, MD  gabapentin (NEURONTIN) 100 MG capsule Take 1 capsule (100 mg total) by mouth 3 (three) times daily. 09/20/22   Storm Frisk, MD  guaiFENesin (ROBITUSSIN) 100 MG/5ML liquid Take 5 mLs by mouth every 6 (six) hours as needed for cough.    [provider]  hydrOXYzine (ATARAX) 25 MG tablet TAKE 1 TABLET (25 MG TOTAL) BY MOUTH 3 (THREE) TIMES DAILY AS NEEDED FOR ANXIETY. Patient taking differently: Take 25 mg by mouth every 6 (six) hours as needed for anxiety. 11/29/22   Storm Frisk, MD  ipratropium-albuterol (DUONEB) 0.5-2.5 (3) MG/3ML SOLN Take 3 mLs by nebulization 4 (four) times daily.    [provider]  nystatin powder Apply 1 Application topically every 8 (eight) hours as needed (wound care). Apply under breast    [provider]  OXYGEN Inhale 4-5 L/min into the lungs continuous. 01/19/22   [provider]  pantoprazole (PROTONIX) 40 MG tablet TAKE 1 TABLET BY MOUTH 2 TIMES DAILY BEFORE A MEAL (AM+EVENING) Patient taking differently: Take 40 mg by mouth 2 (two) times daily before a meal. 11/29/22   Storm Frisk, MD  Polyethyl Glycol-Propyl Glycol (SYSTANE) 0.4-0.3 %  SOLN Place 1 drop into the right eye in the morning, at noon, in the evening, and at bedtime. Patient not taking: Reported on 06/04/2023    [provider]  predniSONE (DELTASONE) 10 MG tablet Take PO 4 tabs daily x 3 days,3 tabs daily x 3 days,2 tabs daily x 3 days,1 tab daily x 2 days then stop. 06/07/23   Lanae Boast, MD  Sertraline HCl 150 MG CAPS Take 150 mg by mouth daily.    [provider]  sodium chloride (OCEAN) 0.65 % SOLN nasal spray Place 2 sprays into both nostrils as needed for congestion. 12/04/22   Azucena Fallen, MD  traMADol (ULTRAM) 50 MG tablet Take 1 tablet (50 mg total) by mouth every 4 (four) hours as needed for up to 4 doses. 06/07/23   Lanae Boast, MD  Vitamin D, Ergocalciferol, (DRISDOL) 1.25 MG (50000 UNIT) CAPS capsule Take 1 capsule (50,000 Units total) by mouth every 7 (seven) days. Patient taking differently: Take 50,000 Units by mouth every Monday. 09/20/22   Storm Frisk, MD  YUPELRI 175 MCG/3ML nebulizer solution Take 3 mLs (175 mcg total) by nebulization daily. Patient not taking: Reported on 06/04/2023 11/22/22   Storm Frisk, MD      Allergies    Prednisone    Review of Systems   Review of Systems  Musculoskeletal:  Positive for arthralgias.  All other systems reviewed and are negative.   Physical Exam Updated Vital Signs BP (!) 134/90   Pulse 99   Temp 98.4 F (36.9 C) (Oral)   Resp 16   Ht 5\' 3"  (1.6 m)   Wt 89 kg   SpO2 99%   BMI 34.76 kg/m  Physical Exam Vitals and nursing note reviewed.  Constitutional:      Appearance: Normal appearance.  HENT:     Head: Normocephalic and atraumatic.  Eyes:     Extraocular Movements: Extraocular movements intact.     Conjunctiva/sclera: Conjunctivae normal.  Cardiovascular:     Rate and Rhythm: Normal rate and regular rhythm.     Pulses: Normal pulses.     Heart sounds: Normal heart sounds. No murmur heard.    No friction rub. No gallop.  Pulmonary:     Effort: Pulmonary effort is normal. No respiratory distress.     Breath sounds: Normal breath sounds.  Abdominal:     General: Abdomen is flat.     Palpations: Abdomen is soft.     Tenderness: There is no abdominal tenderness.  Musculoskeletal:        General: Tenderness, deformity and signs of injury (Minimal swelling noted over the right wrist.) present.  Skin:    General: Skin is warm and dry.  Neurological:     General: No focal deficit present.     Mental Status: She is alert and oriented to person, place, and time. Mental status is at  baseline.     Sensory: No sensory deficit.     Motor: No weakness.  Psychiatric:        Mood and Affect: Mood normal.     ED Results / Procedures / Treatments   Labs (all labs ordered are listed, but only abnormal results are displayed) Labs Reviewed - No data to display  EKG None  Radiology DG Wrist Complete Right Result Date: 09/03/2023 CLINICAL DATA:  Fall on right wrist yesterday with swelling noted. EXAM: RIGHT WRIST - COMPLETE 3+ VIEW COMPARISON:  None Available. FINDINGS: Acute comminuted mildly impacted intra-articular  fracture of the distal right radial metaphysis. Additional mildly displaced ulnar styloid fracture. Soft tissue swelling about the wrist. IMPRESSION: 1. Acute comminuted intra-articular fracture of the distal right radius. 2. Acute mildly displaced ulnar styloid fracture. Electronically Signed   By: Minerva Fester M.D.   On: 09/03/2023 19:33    Procedures Procedures    Medications Ordered in ED Medications  HYDROcodone-acetaminophen (NORCO/VICODIN) 5-325 MG per tablet 1 tablet (1 tablet Oral Given 09/03/23 2104)    ED Course/ Medical Decision Making/ A&P Clinical Course as of 09/03/23 2206  Tue Sep 03, 2023  2112 DG Wrist Complete Right [CB]    Clinical Course User Index [CB] Lunette Stands, PA-C                                 Medical Decision Making Amount and/or Complexity of Data Reviewed Radiology: ordered.  Risk Prescription drug management.   This patient is a 68 year old female who who presents to the ED for concern of right wrist fracture.   Differential diagnoses prior to evaluation: The emergent differential diagnosis includes, but is not limited to, fracture, ligamentous injury, compartment syndrome, sprain. This is not an exhaustive differential.   Past Medical History / Co-morbidities / Social History: COPD, GERD, respiratory failure  Additional history: Chart reviewed. Pertinent results include: Was previous admitted on  06/03/2023 for COPD exacerbation  Lab Tests/Imaging studies: I personally interpreted labs/imaging and the pertinent results include:   X-ray of right wrist showed a an ulnar and distal radius fracture .I agree with the radiologist interpretation.   Medications: I ordered medication including Norco.  I have reviewed the patients home medicines and have made adjustments as needed.   ED Course:   Patient is a 68 year old female presents to the ED complaining of a mechanical fall that happened today after tripping on her oxygen tubing landing on her right wrist.  Fall was witnessed.  And no loss of consciousness or head trauma was noted by both witness and patient.  On physical exam notable swelling was noted over the wrist with tenderness.  Radial pulses were present.  X-rays confirmed a distal radial fracture and ulnar styloid fracture.  Minimal displacement was noted.  Patient was placed in a sugar-tong splint and given pain medication.  Provided her with an outpatient follow-up to orthopedics.  Patient was given strict return to ER precautions.  Patient expressed understanding and agreement of plan.   Disposition: After consideration of the diagnostic results and the patients response to treatment, I feel that the patient benefit of discharge and treatment as above.   emergency department workup does not suggest an emergent condition requiring admission or immediate intervention beyond what has been performed at this time. The plan is: Follow-up with Ortho, continue with Tylenol and IbuProfen for pain management, return for any new or worsening symptoms. The patient is safe for discharge and has been instructed to return immediately for worsening symptoms, change in symptoms or any other concerns.    Final Clinical Impression(s) / ED Diagnoses Final diagnoses:  Traumatic closed fracture of ulnar styloid with minimal displacement, right, initial encounter  Closed nondisplaced comminuted  fracture of shaft of right radius, initial encounter    Rx / DC Orders ED Discharge Orders     None         Lunette Stands, PA-C 09/03/23 2207    Coral Spikes, DO 09/04/23 0011

## 2023-09-03 NOTE — Discharge Instructions (Addendum)
You were seen today for a fracture to your ulnar styloid and radius of your right wrist.  You will need to follow-up with Ortho for hard casting and reevaluation within the week.  I have attached information here.  Call their office and schedule an appointment with them.  However if you begin to experience weakness, tingling, numbness, increased swelling, increased pain return to the ED for reevaluation.   You can take Tylenol and Ibuprofen for pain medication and relief  Take Tylenol (acetominophen)  650mg  every 4-6 hours, as needed for pain or fever. Do not take more than 4,000 mg in a 24-hour period. As this may cause liver damage. While this is rare, if you begin to develop yellowing of the skin or eyes, stop taking and return to ER immediately.  Take Ibuprofen 400mg  every 4-6 hours for pain or fever, not exceeding 3,200 mg per day as more than 3,200mg  can cause Stomach irritation, dizziness, kidney issues with long-term use.

## 2023-10-09 ENCOUNTER — Institutional Professional Consult (permissible substitution): Payer: 59 | Admitting: Pulmonary Disease

## 2023-10-09 NOTE — Progress Notes (Deleted)
 Synopsis: Referred in *** for ***  Subjective:   PATIENT ID: Bianca Murray GENDER: female DOB: Aug 30, 1955, MRN: [id number]   HPI  No chief complaint on file.   ***  Past Medical History:  Diagnosis Date   Arthritis    Asthma    Cancer (HCC)    Chronic respiratory failure with hypoxia and hypercapnia (HCC)    COPD (chronic obstructive pulmonary disease) (HCC)    Hypertension      Family History  Problem Relation Age of Onset   Hypertension Mother    Heart disease Mother    Cancer Mother    Asthma Father    Asthma Sister    Hypertension Sister    Asthma Brother    Cancer Brother      Social History   Socioeconomic History   Marital status: Single    Spouse name: Not on file   Number of children: Not on file   Years of education: Not on file   Highest education level: Not on file  Occupational History   Not on file  Tobacco Use   Smoking status: Former   Smokeless tobacco: Never   Tobacco comments:    quit smoking in 2018    Vaping Use   Vaping status: Never Used  Substance and Sexual Activity   Alcohol use: Not Currently   Drug use: Not Currently   Sexual activity: Not on file  Other Topics Concern   Not on file  Social History Narrative   Not on file   Social Drivers of Health   Financial Resource Strain: Low Risk  (02/05/2023)   Overall Financial Resource Strain (CARDIA)    Difficulty of Paying Living Expenses: Not hard at all  Food Insecurity: No Food Insecurity (06/05/2023)   Hunger Vital Sign    Worried About Running Out of Food in the Last Year: Never true    Ran Out of Food in the Last Year: Never true  Transportation Needs: No Transportation Needs (06/05/2023)   PRAPARE - Administrator, Civil Service (Medical): No    Lack of Transportation (Non-Medical): No  Physical Activity: Inactive (02/05/2023)   Exercise Vital Sign    Days of Exercise per Week: 0 days    Minutes of Exercise per Session: 0 min  Stress: Stress Concern  Present (02/05/2023)   [employer] of Occupational Health - Occupational Stress Questionnaire    Feeling of Stress : To some extent  Social Connections: Socially Isolated (02/05/2023)   Social Connection and Isolation Panel [NHANES]    Frequency of Communication with Friends and Family: Three times a week    Frequency of Social Gatherings with Friends and Family: Once a week    Attends Religious Services: Never    Database administrator or Organizations: No    Attends Banker Meetings: Never    Marital Status: Never married  Intimate Partner Violence: Not At Risk (06/05/2023)   Humiliation, Afraid, Rape, and Kick questionnaire    Fear of Current or Ex-Partner: No    Emotionally Abused: No    Physically Abused: No    Sexually Abused: No     Allergies  Allergen Reactions   Prednisone Anxiety and Other (See Comments)    Made the patient feel very unwell     Outpatient Medications Prior to Visit  Medication Sig Dispense Refill   acetaminophen (TYLENOL) 500 MG tablet Take 1,000 mg by mouth 3 (three) times daily.     albuterol (  VENTOLIN HFA) 108 (90 Base) MCG/ACT inhaler Inhale 2 puffs into the lungs every 6 (six) hours as needed for wheezing or shortness of breath. 8.5 g 0   atorvastatin (LIPITOR) 10 MG tablet Take 1 tablet (10 mg total) by mouth daily. (Patient not taking: Reported on 06/04/2023) 90 tablet 3   benzonatate (TESSALON) 100 MG capsule Take 1 capsule (100 mg total) by mouth 3 (three) times daily as needed for cough. (Patient not taking: Reported on 06/04/2023) 60 capsule 0   budesonide (PULMICORT) 0.5 MG/2ML nebulizer solution Take 2 mLs (0.5 mg total) by nebulization 2 (two) times daily. 120 mL 1   busPIRone (BUSPAR) 15 MG tablet Take 15 mg by mouth 3 (three) times daily.     cetirizine (ZYRTEC) 10 MG tablet Take 10 mg by mouth daily.     diltiazem (CARDIZEM CD) 120 MG 24 hr capsule Take 1 capsule (120 mg total) by mouth daily. 30 capsule 0   ferrous  sulfate 325 (65 FE) MG tablet Take 325 mg by mouth daily.     fluticasone (FLONASE) 50 MCG/ACT nasal spray Place 2 sprays into both nostrils daily. 11.1 mL 0   formoterol (PERFOROMIST) 20 MCG/2ML nebulizer solution Take 2 mLs (20 mcg total) by nebulization 2 (two) times daily. 120 mL 6   gabapentin (NEURONTIN) 100 MG capsule Take 1 capsule (100 mg total) by mouth 3 (three) times daily. 90 capsule 3   guaiFENesin (ROBITUSSIN) 100 MG/5ML liquid Take 5 mLs by mouth every 6 (six) hours as needed for cough.     hydrOXYzine (ATARAX) 25 MG tablet TAKE 1 TABLET (25 MG TOTAL) BY MOUTH 3 (THREE) TIMES DAILY AS NEEDED FOR ANXIETY. (Patient taking differently: Take 25 mg by mouth every 6 (six) hours as needed for anxiety.) 60 tablet 0   ipratropium-albuterol (DUONEB) 0.5-2.5 (3) MG/3ML SOLN Take 3 mLs by nebulization 4 (four) times daily.     nystatin powder Apply 1 Application topically every 8 (eight) hours as needed (wound care). Apply under breast     OXYGEN Inhale 4-5 L/min into the lungs continuous.     pantoprazole (PROTONIX) 40 MG tablet TAKE 1 TABLET BY MOUTH 2 TIMES DAILY BEFORE A MEAL (AM+EVENING) (Patient taking differently: Take 40 mg by mouth 2 (two) times daily before a meal.) 60 tablet 0   Polyethyl Glycol-Propyl Glycol (SYSTANE) 0.4-0.3 % SOLN Place 1 drop into the right eye in the morning, at noon, in the evening, and at bedtime. (Patient not taking: Reported on 06/04/2023)     predniSONE (DELTASONE) 10 MG tablet Take PO 4 tabs daily x 3 days,3 tabs daily x 3 days,2 tabs daily x 3 days,1 tab daily x 2 days then stop.     Sertraline HCl 150 MG CAPS Take 150 mg by mouth daily.     sodium chloride (OCEAN) 0.65 % SOLN nasal spray Place 2 sprays into both nostrils as needed for congestion. 60 mL 3   traMADol (ULTRAM) 50 MG tablet Take 1 tablet (50 mg total) by mouth every 4 (four) hours as needed for up to 4 doses. 4 tablet 0   Vitamin D, Ergocalciferol, (DRISDOL) 1.25 MG (50000 UNIT) CAPS capsule  Take 1 capsule (50,000 Units total) by mouth every 7 (seven) days. (Patient taking differently: Take 50,000 Units by mouth every Monday.) 12 capsule 1   YUPELRI 175 MCG/3ML nebulizer solution Take 3 mLs (175 mcg total) by nebulization daily. (Patient not taking: Reported on 06/04/2023) 90 mL 2   No facility-administered  medications prior to visit.    ROS    Objective:  There were no vitals filed for this visit.   Physical Exam    CBC    Component Value Date/Time   WBC 11.0 (H) 06/07/2023 0522   RBC 3.70 (L) 06/07/2023 0522   HGB 10.3 (L) 06/07/2023 0522   HGB 11.2 06/28/2021 1129   HCT 35.4 (L) 06/07/2023 0522   HCT 34.2 06/28/2021 1129   PLT 365 06/07/2023 0522   PLT 299 06/28/2021 1129   MCV 95.7 06/07/2023 0522   MCV 86 06/28/2021 1129   MCH 27.8 06/07/2023 0522   MCHC 29.1 (L) 06/07/2023 0522   RDW 14.4 06/07/2023 0522   RDW 13.0 06/28/2021 1129   LYMPHSABS 1.4 06/03/2023 1939   LYMPHSABS 1.0 06/28/2021 1129   MONOABS 0.6 06/03/2023 1939   EOSABS 0.4 06/03/2023 1939   EOSABS 0.1 06/28/2021 1129   BASOSABS 0.0 06/03/2023 1939   BASOSABS 0.0 06/28/2021 1129     Chest imaging:  PFT:     No data to display          Labs:  Path:  Echo:  Heart Catheterization:       Assessment & Plan:   No diagnosis found.  Discussion: ***    Current Outpatient Medications:    acetaminophen (TYLENOL) 500 MG tablet, Take 1,000 mg by mouth 3 (three) times daily., Disp: , Rfl:    albuterol (VENTOLIN HFA) 108 (90 Base) MCG/ACT inhaler, Inhale 2 puffs into the lungs every 6 (six) hours as needed for wheezing or shortness of breath., Disp: 8.5 g, Rfl: 0   atorvastatin (LIPITOR) 10 MG tablet, Take 1 tablet (10 mg total) by mouth daily. (Patient not taking: Reported on 06/04/2023), Disp: 90 tablet, Rfl: 3   benzonatate (TESSALON) 100 MG capsule, Take 1 capsule (100 mg total) by mouth 3 (three) times daily as needed for cough. (Patient not taking: Reported on  06/04/2023), Disp: 60 capsule, Rfl: 0   budesonide (PULMICORT) 0.5 MG/2ML nebulizer solution, Take 2 mLs (0.5 mg total) by nebulization 2 (two) times daily., Disp: 120 mL, Rfl: 1   busPIRone (BUSPAR) 15 MG tablet, Take 15 mg by mouth 3 (three) times daily., Disp: , Rfl:    cetirizine (ZYRTEC) 10 MG tablet, Take 10 mg by mouth daily., Disp: , Rfl:    diltiazem (CARDIZEM CD) 120 MG 24 hr capsule, Take 1 capsule (120 mg total) by mouth daily., Disp: 30 capsule, Rfl: 0   ferrous sulfate 325 (65 FE) MG tablet, Take 325 mg by mouth daily., Disp: , Rfl:    fluticasone (FLONASE) 50 MCG/ACT nasal spray, Place 2 sprays into both nostrils daily., Disp: 11.1 mL, Rfl: 0   formoterol (PERFOROMIST) 20 MCG/2ML nebulizer solution, Take 2 mLs (20 mcg total) by nebulization 2 (two) times daily., Disp: 120 mL, Rfl: 6   gabapentin (NEURONTIN) 100 MG capsule, Take 1 capsule (100 mg total) by mouth 3 (three) times daily., Disp: 90 capsule, Rfl: 3   guaiFENesin (ROBITUSSIN) 100 MG/5ML liquid, Take 5 mLs by mouth every 6 (six) hours as needed for cough., Disp: , Rfl:    hydrOXYzine (ATARAX) 25 MG tablet, TAKE 1 TABLET (25 MG TOTAL) BY MOUTH 3 (THREE) TIMES DAILY AS NEEDED FOR ANXIETY. (Patient taking differently: Take 25 mg by mouth every 6 (six) hours as needed for anxiety.), Disp: 60 tablet, Rfl: 0   ipratropium-albuterol (DUONEB) 0.5-2.5 (3) MG/3ML SOLN, Take 3 mLs by nebulization 4 (four) times daily., Disp: , Rfl:  nystatin powder, Apply 1 Application topically every 8 (eight) hours as needed (wound care). Apply under breast, Disp: , Rfl:    OXYGEN, Inhale 4-5 L/min into the lungs continuous., Disp: , Rfl:    pantoprazole (PROTONIX) 40 MG tablet, TAKE 1 TABLET BY MOUTH 2 TIMES DAILY BEFORE A MEAL (AM+EVENING) (Patient taking differently: Take 40 mg by mouth 2 (two) times daily before a meal.), Disp: 60 tablet, Rfl: 0   Polyethyl Glycol-Propyl Glycol (SYSTANE) 0.4-0.3 % SOLN, Place 1 drop into the right eye in the  morning, at noon, in the evening, and at bedtime. (Patient not taking: Reported on 06/04/2023), Disp: , Rfl:    predniSONE (DELTASONE) 10 MG tablet, Take PO 4 tabs daily x 3 days,3 tabs daily x 3 days,2 tabs daily x 3 days,1 tab daily x 2 days then stop., Disp: , Rfl:    Sertraline HCl 150 MG CAPS, Take 150 mg by mouth daily., Disp: , Rfl:    sodium chloride (OCEAN) 0.65 % SOLN nasal spray, Place 2 sprays into both nostrils as needed for congestion., Disp: 60 mL, Rfl: 3   traMADol (ULTRAM) 50 MG tablet, Take 1 tablet (50 mg total) by mouth every 4 (four) hours as needed for up to 4 doses., Disp: 4 tablet, Rfl: 0   Vitamin D, Ergocalciferol, (DRISDOL) 1.25 MG (50000 UNIT) CAPS capsule, Take 1 capsule (50,000 Units total) by mouth every 7 (seven) days. (Patient taking differently: Take 50,000 Units by mouth every Monday.), Disp: 12 capsule, Rfl: 1   YUPELRI 175 MCG/3ML nebulizer solution, Take 3 mLs (175 mcg total) by nebulization daily. (Patient not taking: Reported on 06/04/2023), Disp: 90 mL, Rfl: 2

## 2023-10-25 DIAGNOSIS — E119 Type 2 diabetes mellitus without complications: Secondary | ICD-10-CM | POA: Diagnosis not present

## 2023-10-25 DIAGNOSIS — I10 Essential (primary) hypertension: Secondary | ICD-10-CM | POA: Diagnosis not present

## 2023-10-28 DIAGNOSIS — I10 Essential (primary) hypertension: Secondary | ICD-10-CM | POA: Diagnosis not present

## 2023-10-29 DIAGNOSIS — F5109 Other insomnia not due to a substance or known physiological condition: Secondary | ICD-10-CM | POA: Diagnosis not present

## 2023-11-05 DIAGNOSIS — S52501A Unspecified fracture of the lower end of right radius, initial encounter for closed fracture: Secondary | ICD-10-CM | POA: Diagnosis not present

## 2023-11-12 DIAGNOSIS — H53022 Refractive amblyopia, left eye: Secondary | ICD-10-CM | POA: Diagnosis not present

## 2023-11-12 DIAGNOSIS — H2513 Age-related nuclear cataract, bilateral: Secondary | ICD-10-CM | POA: Diagnosis not present

## 2023-11-19 DIAGNOSIS — J441 Chronic obstructive pulmonary disease with (acute) exacerbation: Secondary | ICD-10-CM | POA: Diagnosis not present

## 2023-11-19 DIAGNOSIS — R293 Abnormal posture: Secondary | ICD-10-CM | POA: Diagnosis not present

## 2023-11-19 DIAGNOSIS — M2550 Pain in unspecified joint: Secondary | ICD-10-CM | POA: Diagnosis not present

## 2023-11-19 DIAGNOSIS — S52611D Displaced fracture of right ulna styloid process, subsequent encounter for closed fracture with routine healing: Secondary | ICD-10-CM | POA: Diagnosis not present

## 2023-11-19 DIAGNOSIS — R1311 Dysphagia, oral phase: Secondary | ICD-10-CM | POA: Diagnosis not present

## 2023-11-19 DIAGNOSIS — R262 Difficulty in walking, not elsewhere classified: Secondary | ICD-10-CM | POA: Diagnosis not present

## 2023-11-19 DIAGNOSIS — D649 Anemia, unspecified: Secondary | ICD-10-CM | POA: Diagnosis not present

## 2023-11-19 DIAGNOSIS — G4700 Insomnia, unspecified: Secondary | ICD-10-CM | POA: Diagnosis not present

## 2023-11-19 DIAGNOSIS — J9602 Acute respiratory failure with hypercapnia: Secondary | ICD-10-CM | POA: Diagnosis not present

## 2023-11-19 DIAGNOSIS — R296 Repeated falls: Secondary | ICD-10-CM | POA: Diagnosis not present

## 2023-11-19 DIAGNOSIS — R278 Other lack of coordination: Secondary | ICD-10-CM | POA: Diagnosis not present

## 2023-11-25 DIAGNOSIS — I10 Essential (primary) hypertension: Secondary | ICD-10-CM | POA: Diagnosis not present

## 2023-11-26 DIAGNOSIS — J441 Chronic obstructive pulmonary disease with (acute) exacerbation: Secondary | ICD-10-CM | POA: Diagnosis not present

## 2023-11-26 DIAGNOSIS — R278 Other lack of coordination: Secondary | ICD-10-CM | POA: Diagnosis not present

## 2023-11-26 DIAGNOSIS — S52354D Nondisplaced comminuted fracture of shaft of radius, right arm, subsequent encounter for closed fracture with routine healing: Secondary | ICD-10-CM | POA: Diagnosis not present

## 2023-11-26 DIAGNOSIS — J9602 Acute respiratory failure with hypercapnia: Secondary | ICD-10-CM | POA: Diagnosis not present

## 2023-11-26 DIAGNOSIS — R1311 Dysphagia, oral phase: Secondary | ICD-10-CM | POA: Diagnosis not present

## 2023-11-26 DIAGNOSIS — R296 Repeated falls: Secondary | ICD-10-CM | POA: Diagnosis not present

## 2023-11-26 DIAGNOSIS — D649 Anemia, unspecified: Secondary | ICD-10-CM | POA: Diagnosis not present

## 2023-11-26 DIAGNOSIS — R262 Difficulty in walking, not elsewhere classified: Secondary | ICD-10-CM | POA: Diagnosis not present

## 2023-11-26 DIAGNOSIS — G4700 Insomnia, unspecified: Secondary | ICD-10-CM | POA: Diagnosis not present

## 2023-11-26 DIAGNOSIS — M2550 Pain in unspecified joint: Secondary | ICD-10-CM | POA: Diagnosis not present

## 2023-11-26 DIAGNOSIS — S52611D Displaced fracture of right ulna styloid process, subsequent encounter for closed fracture with routine healing: Secondary | ICD-10-CM | POA: Diagnosis not present

## 2023-11-26 DIAGNOSIS — R293 Abnormal posture: Secondary | ICD-10-CM | POA: Diagnosis not present

## 2023-11-27 DIAGNOSIS — D649 Anemia, unspecified: Secondary | ICD-10-CM | POA: Diagnosis not present

## 2023-11-27 DIAGNOSIS — G4700 Insomnia, unspecified: Secondary | ICD-10-CM | POA: Diagnosis not present

## 2023-11-27 DIAGNOSIS — J9602 Acute respiratory failure with hypercapnia: Secondary | ICD-10-CM | POA: Diagnosis not present

## 2023-11-27 DIAGNOSIS — R262 Difficulty in walking, not elsewhere classified: Secondary | ICD-10-CM | POA: Diagnosis not present

## 2023-11-27 DIAGNOSIS — M2550 Pain in unspecified joint: Secondary | ICD-10-CM | POA: Diagnosis not present

## 2023-11-27 DIAGNOSIS — R296 Repeated falls: Secondary | ICD-10-CM | POA: Diagnosis not present

## 2023-11-27 DIAGNOSIS — R1311 Dysphagia, oral phase: Secondary | ICD-10-CM | POA: Diagnosis not present

## 2023-11-27 DIAGNOSIS — J441 Chronic obstructive pulmonary disease with (acute) exacerbation: Secondary | ICD-10-CM | POA: Diagnosis not present

## 2023-11-27 DIAGNOSIS — R293 Abnormal posture: Secondary | ICD-10-CM | POA: Diagnosis not present

## 2023-11-27 DIAGNOSIS — S52611D Displaced fracture of right ulna styloid process, subsequent encounter for closed fracture with routine healing: Secondary | ICD-10-CM | POA: Diagnosis not present

## 2023-11-27 DIAGNOSIS — R278 Other lack of coordination: Secondary | ICD-10-CM | POA: Diagnosis not present

## 2023-11-28 DIAGNOSIS — R278 Other lack of coordination: Secondary | ICD-10-CM | POA: Diagnosis not present

## 2023-11-28 DIAGNOSIS — F5109 Other insomnia not due to a substance or known physiological condition: Secondary | ICD-10-CM | POA: Diagnosis not present

## 2023-11-28 DIAGNOSIS — J441 Chronic obstructive pulmonary disease with (acute) exacerbation: Secondary | ICD-10-CM | POA: Diagnosis not present

## 2023-11-28 DIAGNOSIS — G4700 Insomnia, unspecified: Secondary | ICD-10-CM | POA: Diagnosis not present

## 2023-11-28 DIAGNOSIS — R293 Abnormal posture: Secondary | ICD-10-CM | POA: Diagnosis not present

## 2023-11-28 DIAGNOSIS — J9602 Acute respiratory failure with hypercapnia: Secondary | ICD-10-CM | POA: Diagnosis not present

## 2023-11-28 DIAGNOSIS — M2550 Pain in unspecified joint: Secondary | ICD-10-CM | POA: Diagnosis not present

## 2023-11-28 DIAGNOSIS — R262 Difficulty in walking, not elsewhere classified: Secondary | ICD-10-CM | POA: Diagnosis not present

## 2023-11-28 DIAGNOSIS — S52611D Displaced fracture of right ulna styloid process, subsequent encounter for closed fracture with routine healing: Secondary | ICD-10-CM | POA: Diagnosis not present

## 2023-11-28 DIAGNOSIS — R1311 Dysphagia, oral phase: Secondary | ICD-10-CM | POA: Diagnosis not present

## 2023-11-28 DIAGNOSIS — R296 Repeated falls: Secondary | ICD-10-CM | POA: Diagnosis not present

## 2023-11-28 DIAGNOSIS — D649 Anemia, unspecified: Secondary | ICD-10-CM | POA: Diagnosis not present

## 2023-11-29 DIAGNOSIS — R262 Difficulty in walking, not elsewhere classified: Secondary | ICD-10-CM | POA: Diagnosis not present

## 2023-11-29 DIAGNOSIS — G4700 Insomnia, unspecified: Secondary | ICD-10-CM | POA: Diagnosis not present

## 2023-11-29 DIAGNOSIS — S52354D Nondisplaced comminuted fracture of shaft of radius, right arm, subsequent encounter for closed fracture with routine healing: Secondary | ICD-10-CM | POA: Diagnosis not present

## 2023-11-29 DIAGNOSIS — R278 Other lack of coordination: Secondary | ICD-10-CM | POA: Diagnosis not present

## 2023-11-29 DIAGNOSIS — S52611D Displaced fracture of right ulna styloid process, subsequent encounter for closed fracture with routine healing: Secondary | ICD-10-CM | POA: Diagnosis not present

## 2023-11-29 DIAGNOSIS — J441 Chronic obstructive pulmonary disease with (acute) exacerbation: Secondary | ICD-10-CM | POA: Diagnosis not present

## 2023-11-29 DIAGNOSIS — D649 Anemia, unspecified: Secondary | ICD-10-CM | POA: Diagnosis not present

## 2023-11-29 DIAGNOSIS — R1311 Dysphagia, oral phase: Secondary | ICD-10-CM | POA: Diagnosis not present

## 2023-11-29 DIAGNOSIS — M2550 Pain in unspecified joint: Secondary | ICD-10-CM | POA: Diagnosis not present

## 2023-11-29 DIAGNOSIS — R296 Repeated falls: Secondary | ICD-10-CM | POA: Diagnosis not present

## 2023-11-29 DIAGNOSIS — R293 Abnormal posture: Secondary | ICD-10-CM | POA: Diagnosis not present

## 2023-11-29 DIAGNOSIS — J9602 Acute respiratory failure with hypercapnia: Secondary | ICD-10-CM | POA: Diagnosis not present

## 2023-12-01 DIAGNOSIS — H524 Presbyopia: Secondary | ICD-10-CM | POA: Diagnosis not present

## 2023-12-01 DIAGNOSIS — H5213 Myopia, bilateral: Secondary | ICD-10-CM | POA: Diagnosis not present

## 2023-12-02 DIAGNOSIS — G4700 Insomnia, unspecified: Secondary | ICD-10-CM | POA: Diagnosis not present

## 2023-12-02 DIAGNOSIS — S52611D Displaced fracture of right ulna styloid process, subsequent encounter for closed fracture with routine healing: Secondary | ICD-10-CM | POA: Diagnosis not present

## 2023-12-02 DIAGNOSIS — R278 Other lack of coordination: Secondary | ICD-10-CM | POA: Diagnosis not present

## 2023-12-02 DIAGNOSIS — R293 Abnormal posture: Secondary | ICD-10-CM | POA: Diagnosis not present

## 2023-12-02 DIAGNOSIS — J9602 Acute respiratory failure with hypercapnia: Secondary | ICD-10-CM | POA: Diagnosis not present

## 2023-12-02 DIAGNOSIS — R262 Difficulty in walking, not elsewhere classified: Secondary | ICD-10-CM | POA: Diagnosis not present

## 2023-12-02 DIAGNOSIS — J441 Chronic obstructive pulmonary disease with (acute) exacerbation: Secondary | ICD-10-CM | POA: Diagnosis not present

## 2023-12-02 DIAGNOSIS — M2550 Pain in unspecified joint: Secondary | ICD-10-CM | POA: Diagnosis not present

## 2023-12-02 DIAGNOSIS — R296 Repeated falls: Secondary | ICD-10-CM | POA: Diagnosis not present

## 2023-12-02 DIAGNOSIS — R1311 Dysphagia, oral phase: Secondary | ICD-10-CM | POA: Diagnosis not present

## 2023-12-02 DIAGNOSIS — D649 Anemia, unspecified: Secondary | ICD-10-CM | POA: Diagnosis not present

## 2023-12-05 ENCOUNTER — Ambulatory Visit: Payer: 59 | Admitting: Pulmonary Disease

## 2023-12-05 ENCOUNTER — Encounter: Payer: Self-pay | Admitting: Pulmonary Disease

## 2023-12-05 DIAGNOSIS — R1311 Dysphagia, oral phase: Secondary | ICD-10-CM | POA: Diagnosis not present

## 2023-12-05 DIAGNOSIS — R296 Repeated falls: Secondary | ICD-10-CM | POA: Diagnosis not present

## 2023-12-05 DIAGNOSIS — R278 Other lack of coordination: Secondary | ICD-10-CM | POA: Diagnosis not present

## 2023-12-05 DIAGNOSIS — S52611D Displaced fracture of right ulna styloid process, subsequent encounter for closed fracture with routine healing: Secondary | ICD-10-CM | POA: Diagnosis not present

## 2023-12-05 DIAGNOSIS — S52354D Nondisplaced comminuted fracture of shaft of radius, right arm, subsequent encounter for closed fracture with routine healing: Secondary | ICD-10-CM | POA: Diagnosis not present

## 2023-12-05 DIAGNOSIS — R293 Abnormal posture: Secondary | ICD-10-CM | POA: Diagnosis not present

## 2023-12-05 DIAGNOSIS — J9602 Acute respiratory failure with hypercapnia: Secondary | ICD-10-CM | POA: Diagnosis not present

## 2023-12-05 DIAGNOSIS — M2550 Pain in unspecified joint: Secondary | ICD-10-CM | POA: Diagnosis not present

## 2023-12-05 DIAGNOSIS — D649 Anemia, unspecified: Secondary | ICD-10-CM | POA: Diagnosis not present

## 2023-12-05 DIAGNOSIS — J441 Chronic obstructive pulmonary disease with (acute) exacerbation: Secondary | ICD-10-CM | POA: Diagnosis not present

## 2023-12-05 DIAGNOSIS — R262 Difficulty in walking, not elsewhere classified: Secondary | ICD-10-CM | POA: Diagnosis not present

## 2023-12-05 DIAGNOSIS — G4700 Insomnia, unspecified: Secondary | ICD-10-CM | POA: Diagnosis not present

## 2023-12-05 NOTE — Progress Notes (Deleted)
 Synopsis: Referred in April 2025 for COPD and Chronic Hypoxemic respiratory failure  Subjective:   PATIENT ID: Bianca Murray GENDER: female DOB: [birth year], MRN: [id number]   HPI  No chief complaint on file.  Bianca Murray is a 68 year old woman, former smoker with history of hypertension who is referred to pulmonary clinic for COPD and chronic respiratory failure.     Past Medical History:  Diagnosis Date   Arthritis    Asthma    Cancer (HCC)    Chronic respiratory failure with hypoxia and hypercapnia (HCC)    COPD (chronic obstructive pulmonary disease) (HCC)    Hypertension      Family History  Problem Relation Age of Onset   Hypertension Mother    Heart disease Mother    Cancer Mother    Asthma Father    Asthma Sister    Hypertension Sister    Asthma Brother    Cancer Brother      Social History   Socioeconomic History   Marital status: Single    Spouse name: Not on file   Number of children: Not on file   Years of education: Not on file   Highest education level: Not on file  Occupational History   Not on file  Tobacco Use   Smoking status: Former   Smokeless tobacco: Never   Tobacco comments:    quit smoking in 2018    Vaping Use   Vaping status: Never Used  Substance and Sexual Activity   Alcohol use: Not Currently   Drug use: Not Currently   Sexual activity: Not on file  Other Topics Concern   Not on file  Social History Narrative   Not on file   Social Drivers of Health   Financial Resource Strain: Low Risk  (02/05/2023)   Overall Financial Resource Strain (CARDIA)    Difficulty of Paying Living Expenses: Not hard at all  Food Insecurity: No Food Insecurity (06/05/2023)   Hunger Vital Sign    Worried About Running Out of Food in the Last Year: Never true    Ran Out of Food in the Last Year: Never true  Transportation Needs: No Transportation Needs (06/05/2023)   PRAPARE - Administrator, Civil Service (Medical): No     Lack of Transportation (Non-Medical): No  Physical Activity: Inactive (02/05/2023)   Exercise Vital Sign    Days of Exercise per Week: 0 days    Minutes of Exercise per Session: 0 min  Stress: Stress Concern Present (02/05/2023)   [employer] of Occupational Health - Occupational Stress Questionnaire    Feeling of Stress : To some extent  Social Connections: Socially Isolated (02/05/2023)   Social Connection and Isolation Panel [NHANES]    Frequency of Communication with Friends and Family: Three times a week    Frequency of Social Gatherings with Friends and Family: Once a week    Attends Religious Services: Never    Database administrator or Organizations: No    Attends Banker Meetings: Never    Marital Status: Never married  Intimate Partner Violence: Not At Risk (06/05/2023)   Humiliation, Afraid, Rape, and Kick questionnaire    Fear of Current or Ex-Partner: No    Emotionally Abused: No    Physically Abused: No    Sexually Abused: No     Allergies  Allergen Reactions   Prednisone  Anxiety and Other (See Comments)    Made the patient feel very unwell  Outpatient Medications Prior to Visit  Medication Sig Dispense Refill   acetaminophen  (TYLENOL ) 500 MG tablet Take 1,000 mg by mouth 3 (three) times daily.     albuterol  (VENTOLIN  HFA) 108 (90 Base) MCG/ACT inhaler Inhale 2 puffs into the lungs every 6 (six) hours as needed for wheezing or shortness of breath. 8.5 g 0   atorvastatin  (LIPITOR) 10 MG tablet Take 1 tablet (10 mg total) by mouth daily. (Patient not taking: Reported on 06/04/2023) 90 tablet 3   benzonatate  (TESSALON ) 100 MG capsule Take 1 capsule (100 mg total) by mouth 3 (three) times daily as needed for cough. (Patient not taking: Reported on 06/04/2023) 60 capsule 0   budesonide  (PULMICORT ) 0.5 MG/2ML nebulizer solution Take 2 mLs (0.5 mg total) by nebulization 2 (two) times daily. 120 mL 1   busPIRone  (BUSPAR ) 15 MG tablet Take 15 mg by mouth 3  (three) times daily.     cetirizine (ZYRTEC) 10 MG tablet Take 10 mg by mouth daily.     diltiazem  (CARDIZEM  CD) 120 MG 24 hr capsule Take 1 capsule (120 mg total) by mouth daily. 30 capsule 0   ferrous sulfate  325 (65 FE) MG tablet Take 325 mg by mouth daily.     fluticasone  (FLONASE ) 50 MCG/ACT nasal spray Place 2 sprays into both nostrils daily. 11.1 mL 0   formoterol  (PERFOROMIST ) 20 MCG/2ML nebulizer solution Take 2 mLs (20 mcg total) by nebulization 2 (two) times daily. 120 mL 6   gabapentin  (NEURONTIN ) 100 MG capsule Take 1 capsule (100 mg total) by mouth 3 (three) times daily. 90 capsule 3   guaiFENesin  (ROBITUSSIN) 100 MG/5ML liquid Take 5 mLs by mouth every 6 (six) hours as needed for cough.     hydrOXYzine  (ATARAX ) 25 MG tablet TAKE 1 TABLET (25 MG TOTAL) BY MOUTH 3 (THREE) TIMES DAILY AS NEEDED FOR ANXIETY. (Patient taking differently: Take 25 mg by mouth every 6 (six) hours as needed for anxiety.) 60 tablet 0   ipratropium-albuterol  (DUONEB) 0.5-2.5 (3) MG/3ML SOLN Take 3 mLs by nebulization 4 (four) times daily.     nystatin powder Apply 1 Application topically every 8 (eight) hours as needed (wound care). Apply under breast     OXYGEN  Inhale 4-5 L/min into the lungs continuous.     pantoprazole  (PROTONIX ) 40 MG tablet TAKE 1 TABLET BY MOUTH 2 TIMES DAILY BEFORE A MEAL (AM+EVENING) (Patient taking differently: Take 40 mg by mouth 2 (two) times daily before a meal.) 60 tablet 0   Polyethyl Glycol-Propyl Glycol (SYSTANE) 0.4-0.3 % SOLN Place 1 drop into the right eye in the morning, at noon, in the evening, and at bedtime. (Patient not taking: Reported on 06/04/2023)     predniSONE  (DELTASONE ) 10 MG tablet Take PO 4 tabs daily x 3 days,3 tabs daily x 3 days,2 tabs daily x 3 days,1 tab daily x 2 days then stop.     Sertraline  HCl 150 MG CAPS Take 150 mg by mouth daily.     sodium chloride  (OCEAN) 0.65 % SOLN nasal spray Place 2 sprays into both nostrils as needed for congestion. 60 mL 3    traMADol  (ULTRAM ) 50 MG tablet Take 1 tablet (50 mg total) by mouth every 4 (four) hours as needed for up to 4 doses. 4 tablet 0   Vitamin D , Ergocalciferol , (DRISDOL ) 1.25 MG (50000 UNIT) CAPS capsule Take 1 capsule (50,000 Units total) by mouth every 7 (seven) days. (Patient taking differently: Take 50,000 Units by mouth every Monday.) 12  capsule 1   YUPELRI  175 MCG/3ML nebulizer solution Take 3 mLs (175 mcg total) by nebulization daily. (Patient not taking: Reported on 06/04/2023) 90 mL 2   No facility-administered medications prior to visit.    ROS    Objective:  There were no vitals filed for this visit.   Physical Exam    CBC    Component Value Date/Time   WBC 11.0 (H) 06/07/2023 0522   RBC 3.70 (L) 06/07/2023 0522   HGB 10.3 (L) 06/07/2023 0522   HGB 11.2 06/28/2021 1129   HCT 35.4 (L) 06/07/2023 0522   HCT 34.2 06/28/2021 1129   PLT 365 06/07/2023 0522   PLT 299 06/28/2021 1129   MCV 95.7 06/07/2023 0522   MCV 86 06/28/2021 1129   MCH 27.8 06/07/2023 0522   MCHC 29.1 (L) 06/07/2023 0522   RDW 14.4 06/07/2023 0522   RDW 13.0 06/28/2021 1129   LYMPHSABS 1.4 06/03/2023 1939   LYMPHSABS 1.0 06/28/2021 1129   MONOABS 0.6 06/03/2023 1939   EOSABS 0.4 06/03/2023 1939   EOSABS 0.1 06/28/2021 1129   BASOSABS 0.0 06/03/2023 1939   BASOSABS 0.0 06/28/2021 1129      Latest Ref Rng & Units 06/07/2023    5:22 AM 06/06/2023    4:56 AM 06/05/2023    8:39 AM  BMP  Glucose 70 - 99 mg/dL 782  956  213   BUN 8 - 23 mg/dL 19  21  15    Creatinine 0.44 - 1.00 mg/dL 0.86  5.78  4.69   Sodium 135 - 145 mmol/L 141  136  139   Potassium 3.5 - 5.1 mmol/L 4.4  4.1  4.8   Chloride 98 - 111 mmol/L 102  96  98   CO2 22 - 32 mmol/L 30  30  32   Calcium  8.9 - 10.3 mg/dL 9.1  9.2  9.3    Chest imaging: CTA PE Chest 06/03/23 Mediastinum/Nodes: No mediastinal, hilar, or axillary lymphadenopathy. The thyroid gland and esophagus are within normal limits. Small hiatal hernia is noted.  There is collapse of the mid trachea, which may be associated with tracheomalacia.   Lungs/Pleura: Emphysematous changes are present in the lungs. Bronchial wall thickening is noted bilaterally, most pronounced in the right lower lobe. Strandy airspace disease is noted in the lingular segment of the left upper lobe, right middle lobe, and lower lobes bilaterally. No effusion or pneumothorax.  PFT:     No data to display         PFT from 2018: FEV1/FVC: 43 FEV1: 0.57L, 25% FVC 1.32L, 45% TLC 91% RV 154% DLCO 20%  Labs:  Path:  Echo:  Heart Catheterization:     Assessment & Plan:   Chronic obstructive pulmonary disease, unspecified COPD type (HCC)  Discussion: ***    Current Outpatient Medications:    acetaminophen  (TYLENOL ) 500 MG tablet, Take 1,000 mg by mouth 3 (three) times daily., Disp: , Rfl:    albuterol  (VENTOLIN  HFA) 108 (90 Base) MCG/ACT inhaler, Inhale 2 puffs into the lungs every 6 (six) hours as needed for wheezing or shortness of breath., Disp: 8.5 g, Rfl: 0   atorvastatin  (LIPITOR) 10 MG tablet, Take 1 tablet (10 mg total) by mouth daily. (Patient not taking: Reported on 06/04/2023), Disp: 90 tablet, Rfl: 3   benzonatate  (TESSALON ) 100 MG capsule, Take 1 capsule (100 mg total) by mouth 3 (three) times daily as needed for cough. (Patient not taking: Reported on 06/04/2023), Disp: 60 capsule, Rfl: 0  budesonide  (PULMICORT ) 0.5 MG/2ML nebulizer solution, Take 2 mLs (0.5 mg total) by nebulization 2 (two) times daily., Disp: 120 mL, Rfl: 1   busPIRone  (BUSPAR ) 15 MG tablet, Take 15 mg by mouth 3 (three) times daily., Disp: , Rfl:    cetirizine (ZYRTEC) 10 MG tablet, Take 10 mg by mouth daily., Disp: , Rfl:    diltiazem  (CARDIZEM  CD) 120 MG 24 hr capsule, Take 1 capsule (120 mg total) by mouth daily., Disp: 30 capsule, Rfl: 0   ferrous sulfate  325 (65 FE) MG tablet, Take 325 mg by mouth daily., Disp: , Rfl:    fluticasone  (FLONASE ) 50 MCG/ACT nasal spray,  Place 2 sprays into both nostrils daily., Disp: 11.1 mL, Rfl: 0   formoterol  (PERFOROMIST ) 20 MCG/2ML nebulizer solution, Take 2 mLs (20 mcg total) by nebulization 2 (two) times daily., Disp: 120 mL, Rfl: 6   gabapentin  (NEURONTIN ) 100 MG capsule, Take 1 capsule (100 mg total) by mouth 3 (three) times daily., Disp: 90 capsule, Rfl: 3   guaiFENesin  (ROBITUSSIN) 100 MG/5ML liquid, Take 5 mLs by mouth every 6 (six) hours as needed for cough., Disp: , Rfl:    hydrOXYzine  (ATARAX ) 25 MG tablet, TAKE 1 TABLET (25 MG TOTAL) BY MOUTH 3 (THREE) TIMES DAILY AS NEEDED FOR ANXIETY. (Patient taking differently: Take 25 mg by mouth every 6 (six) hours as needed for anxiety.), Disp: 60 tablet, Rfl: 0   ipratropium-albuterol  (DUONEB) 0.5-2.5 (3) MG/3ML SOLN, Take 3 mLs by nebulization 4 (four) times daily., Disp: , Rfl:    nystatin powder, Apply 1 Application topically every 8 (eight) hours as needed (wound care). Apply under breast, Disp: , Rfl:    OXYGEN , Inhale 4-5 L/min into the lungs continuous., Disp: , Rfl:    pantoprazole  (PROTONIX ) 40 MG tablet, TAKE 1 TABLET BY MOUTH 2 TIMES DAILY BEFORE A MEAL (AM+EVENING) (Patient taking differently: Take 40 mg by mouth 2 (two) times daily before a meal.), Disp: 60 tablet, Rfl: 0   Polyethyl Glycol-Propyl Glycol (SYSTANE) 0.4-0.3 % SOLN, Place 1 drop into the right eye in the morning, at noon, in the evening, and at bedtime. (Patient not taking: Reported on 06/04/2023), Disp: , Rfl:    predniSONE  (DELTASONE ) 10 MG tablet, Take PO 4 tabs daily x 3 days,3 tabs daily x 3 days,2 tabs daily x 3 days,1 tab daily x 2 days then stop., Disp: , Rfl:    Sertraline  HCl 150 MG CAPS, Take 150 mg by mouth daily., Disp: , Rfl:    sodium chloride  (OCEAN) 0.65 % SOLN nasal spray, Place 2 sprays into both nostrils as needed for congestion., Disp: 60 mL, Rfl: 3   traMADol  (ULTRAM ) 50 MG tablet, Take 1 tablet (50 mg total) by mouth every 4 (four) hours as needed for up to 4 doses., Disp: 4  tablet, Rfl: 0   Vitamin D , Ergocalciferol , (DRISDOL ) 1.25 MG (50000 UNIT) CAPS capsule, Take 1 capsule (50,000 Units total) by mouth every 7 (seven) days. (Patient taking differently: Take 50,000 Units by mouth every Monday.), Disp: 12 capsule, Rfl: 1   YUPELRI  175 MCG/3ML nebulizer solution, Take 3 mLs (175 mcg total) by nebulization daily. (Patient not taking: Reported on 06/04/2023), Disp: 90 mL, Rfl: 2

## 2023-12-17 DIAGNOSIS — S52501A Unspecified fracture of the lower end of right radius, initial encounter for closed fracture: Secondary | ICD-10-CM | POA: Diagnosis not present

## 2023-12-23 DIAGNOSIS — J441 Chronic obstructive pulmonary disease with (acute) exacerbation: Secondary | ICD-10-CM | POA: Diagnosis not present

## 2023-12-23 DIAGNOSIS — M6281 Muscle weakness (generalized): Secondary | ICD-10-CM | POA: Diagnosis not present

## 2023-12-24 DIAGNOSIS — M6281 Muscle weakness (generalized): Secondary | ICD-10-CM | POA: Diagnosis not present

## 2023-12-24 DIAGNOSIS — J441 Chronic obstructive pulmonary disease with (acute) exacerbation: Secondary | ICD-10-CM | POA: Diagnosis not present

## 2023-12-25 DIAGNOSIS — J441 Chronic obstructive pulmonary disease with (acute) exacerbation: Secondary | ICD-10-CM | POA: Diagnosis not present

## 2023-12-25 DIAGNOSIS — M6281 Muscle weakness (generalized): Secondary | ICD-10-CM | POA: Diagnosis not present

## 2023-12-26 DIAGNOSIS — J441 Chronic obstructive pulmonary disease with (acute) exacerbation: Secondary | ICD-10-CM | POA: Diagnosis not present

## 2023-12-26 DIAGNOSIS — M6281 Muscle weakness (generalized): Secondary | ICD-10-CM | POA: Diagnosis not present

## 2023-12-27 DIAGNOSIS — J441 Chronic obstructive pulmonary disease with (acute) exacerbation: Secondary | ICD-10-CM | POA: Diagnosis not present

## 2023-12-27 DIAGNOSIS — M6281 Muscle weakness (generalized): Secondary | ICD-10-CM | POA: Diagnosis not present

## 2023-12-30 DIAGNOSIS — J441 Chronic obstructive pulmonary disease with (acute) exacerbation: Secondary | ICD-10-CM | POA: Diagnosis not present

## 2023-12-30 DIAGNOSIS — M6281 Muscle weakness (generalized): Secondary | ICD-10-CM | POA: Diagnosis not present

## 2023-12-30 DIAGNOSIS — I10 Essential (primary) hypertension: Secondary | ICD-10-CM | POA: Diagnosis not present

## 2023-12-31 DIAGNOSIS — J441 Chronic obstructive pulmonary disease with (acute) exacerbation: Secondary | ICD-10-CM | POA: Diagnosis not present

## 2023-12-31 DIAGNOSIS — M6281 Muscle weakness (generalized): Secondary | ICD-10-CM | POA: Diagnosis not present

## 2023-12-31 DIAGNOSIS — F5109 Other insomnia not due to a substance or known physiological condition: Secondary | ICD-10-CM | POA: Diagnosis not present

## 2024-01-01 DIAGNOSIS — J441 Chronic obstructive pulmonary disease with (acute) exacerbation: Secondary | ICD-10-CM | POA: Diagnosis not present

## 2024-01-01 DIAGNOSIS — M6281 Muscle weakness (generalized): Secondary | ICD-10-CM | POA: Diagnosis not present

## 2024-01-02 DIAGNOSIS — J441 Chronic obstructive pulmonary disease with (acute) exacerbation: Secondary | ICD-10-CM | POA: Diagnosis not present

## 2024-01-02 DIAGNOSIS — M6281 Muscle weakness (generalized): Secondary | ICD-10-CM | POA: Diagnosis not present

## 2024-01-06 DIAGNOSIS — M6281 Muscle weakness (generalized): Secondary | ICD-10-CM | POA: Diagnosis not present

## 2024-01-06 DIAGNOSIS — J441 Chronic obstructive pulmonary disease with (acute) exacerbation: Secondary | ICD-10-CM | POA: Diagnosis not present

## 2024-01-07 DIAGNOSIS — M6281 Muscle weakness (generalized): Secondary | ICD-10-CM | POA: Diagnosis not present

## 2024-01-07 DIAGNOSIS — J441 Chronic obstructive pulmonary disease with (acute) exacerbation: Secondary | ICD-10-CM | POA: Diagnosis not present

## 2024-01-08 DIAGNOSIS — M6281 Muscle weakness (generalized): Secondary | ICD-10-CM | POA: Diagnosis not present

## 2024-01-08 DIAGNOSIS — J441 Chronic obstructive pulmonary disease with (acute) exacerbation: Secondary | ICD-10-CM | POA: Diagnosis not present

## 2024-01-09 DIAGNOSIS — J441 Chronic obstructive pulmonary disease with (acute) exacerbation: Secondary | ICD-10-CM | POA: Diagnosis not present

## 2024-01-09 DIAGNOSIS — M6281 Muscle weakness (generalized): Secondary | ICD-10-CM | POA: Diagnosis not present

## 2024-01-10 DIAGNOSIS — M6281 Muscle weakness (generalized): Secondary | ICD-10-CM | POA: Diagnosis not present

## 2024-01-10 DIAGNOSIS — J441 Chronic obstructive pulmonary disease with (acute) exacerbation: Secondary | ICD-10-CM | POA: Diagnosis not present

## 2024-01-13 DIAGNOSIS — J441 Chronic obstructive pulmonary disease with (acute) exacerbation: Secondary | ICD-10-CM | POA: Diagnosis not present

## 2024-01-13 DIAGNOSIS — M6281 Muscle weakness (generalized): Secondary | ICD-10-CM | POA: Diagnosis not present

## 2024-01-13 DIAGNOSIS — R262 Difficulty in walking, not elsewhere classified: Secondary | ICD-10-CM | POA: Diagnosis not present

## 2024-01-14 DIAGNOSIS — M6281 Muscle weakness (generalized): Secondary | ICD-10-CM | POA: Diagnosis not present

## 2024-01-14 DIAGNOSIS — R262 Difficulty in walking, not elsewhere classified: Secondary | ICD-10-CM | POA: Diagnosis not present

## 2024-01-14 DIAGNOSIS — J441 Chronic obstructive pulmonary disease with (acute) exacerbation: Secondary | ICD-10-CM | POA: Diagnosis not present

## 2024-01-15 DIAGNOSIS — M6281 Muscle weakness (generalized): Secondary | ICD-10-CM | POA: Diagnosis not present

## 2024-01-15 DIAGNOSIS — R262 Difficulty in walking, not elsewhere classified: Secondary | ICD-10-CM | POA: Diagnosis not present

## 2024-01-15 DIAGNOSIS — J441 Chronic obstructive pulmonary disease with (acute) exacerbation: Secondary | ICD-10-CM | POA: Diagnosis not present

## 2024-01-16 DIAGNOSIS — J441 Chronic obstructive pulmonary disease with (acute) exacerbation: Secondary | ICD-10-CM | POA: Diagnosis not present

## 2024-01-16 DIAGNOSIS — M6281 Muscle weakness (generalized): Secondary | ICD-10-CM | POA: Diagnosis not present

## 2024-01-16 DIAGNOSIS — R262 Difficulty in walking, not elsewhere classified: Secondary | ICD-10-CM | POA: Diagnosis not present

## 2024-01-17 DIAGNOSIS — J441 Chronic obstructive pulmonary disease with (acute) exacerbation: Secondary | ICD-10-CM | POA: Diagnosis not present

## 2024-01-17 DIAGNOSIS — M6281 Muscle weakness (generalized): Secondary | ICD-10-CM | POA: Diagnosis not present

## 2024-01-17 DIAGNOSIS — R262 Difficulty in walking, not elsewhere classified: Secondary | ICD-10-CM | POA: Diagnosis not present

## 2024-01-20 DIAGNOSIS — R262 Difficulty in walking, not elsewhere classified: Secondary | ICD-10-CM | POA: Diagnosis not present

## 2024-01-20 DIAGNOSIS — M6281 Muscle weakness (generalized): Secondary | ICD-10-CM | POA: Diagnosis not present

## 2024-01-20 DIAGNOSIS — J441 Chronic obstructive pulmonary disease with (acute) exacerbation: Secondary | ICD-10-CM | POA: Diagnosis not present

## 2024-01-22 DIAGNOSIS — M6281 Muscle weakness (generalized): Secondary | ICD-10-CM | POA: Diagnosis not present

## 2024-01-22 DIAGNOSIS — J441 Chronic obstructive pulmonary disease with (acute) exacerbation: Secondary | ICD-10-CM | POA: Diagnosis not present

## 2024-01-22 DIAGNOSIS — R262 Difficulty in walking, not elsewhere classified: Secondary | ICD-10-CM | POA: Diagnosis not present

## 2024-01-23 DIAGNOSIS — M6281 Muscle weakness (generalized): Secondary | ICD-10-CM | POA: Diagnosis not present

## 2024-01-23 DIAGNOSIS — R262 Difficulty in walking, not elsewhere classified: Secondary | ICD-10-CM | POA: Diagnosis not present

## 2024-01-23 DIAGNOSIS — J441 Chronic obstructive pulmonary disease with (acute) exacerbation: Secondary | ICD-10-CM | POA: Diagnosis not present

## 2024-01-24 DIAGNOSIS — M6281 Muscle weakness (generalized): Secondary | ICD-10-CM | POA: Diagnosis not present

## 2024-01-24 DIAGNOSIS — R262 Difficulty in walking, not elsewhere classified: Secondary | ICD-10-CM | POA: Diagnosis not present

## 2024-01-24 DIAGNOSIS — J441 Chronic obstructive pulmonary disease with (acute) exacerbation: Secondary | ICD-10-CM | POA: Diagnosis not present

## 2024-01-27 DIAGNOSIS — R293 Abnormal posture: Secondary | ICD-10-CM | POA: Diagnosis not present

## 2024-01-27 DIAGNOSIS — M6281 Muscle weakness (generalized): Secondary | ICD-10-CM | POA: Diagnosis not present

## 2024-01-27 DIAGNOSIS — R278 Other lack of coordination: Secondary | ICD-10-CM | POA: Diagnosis not present

## 2024-01-27 DIAGNOSIS — J9602 Acute respiratory failure with hypercapnia: Secondary | ICD-10-CM | POA: Diagnosis not present

## 2024-01-27 DIAGNOSIS — R262 Difficulty in walking, not elsewhere classified: Secondary | ICD-10-CM | POA: Diagnosis not present

## 2024-01-27 DIAGNOSIS — J441 Chronic obstructive pulmonary disease with (acute) exacerbation: Secondary | ICD-10-CM | POA: Diagnosis not present

## 2024-01-27 DIAGNOSIS — R1311 Dysphagia, oral phase: Secondary | ICD-10-CM | POA: Diagnosis not present

## 2024-01-27 DIAGNOSIS — S52354D Nondisplaced comminuted fracture of shaft of radius, right arm, subsequent encounter for closed fracture with routine healing: Secondary | ICD-10-CM | POA: Diagnosis not present

## 2024-01-27 DIAGNOSIS — M2550 Pain in unspecified joint: Secondary | ICD-10-CM | POA: Diagnosis not present

## 2024-01-27 DIAGNOSIS — R296 Repeated falls: Secondary | ICD-10-CM | POA: Diagnosis not present

## 2024-01-27 DIAGNOSIS — I10 Essential (primary) hypertension: Secondary | ICD-10-CM | POA: Diagnosis not present

## 2024-01-27 DIAGNOSIS — G4700 Insomnia, unspecified: Secondary | ICD-10-CM | POA: Diagnosis not present

## 2024-01-27 DIAGNOSIS — S52611D Displaced fracture of right ulna styloid process, subsequent encounter for closed fracture with routine healing: Secondary | ICD-10-CM | POA: Diagnosis not present

## 2024-01-27 DIAGNOSIS — D649 Anemia, unspecified: Secondary | ICD-10-CM | POA: Diagnosis not present

## 2024-01-29 DIAGNOSIS — R296 Repeated falls: Secondary | ICD-10-CM | POA: Diagnosis not present

## 2024-01-29 DIAGNOSIS — G4700 Insomnia, unspecified: Secondary | ICD-10-CM | POA: Diagnosis not present

## 2024-01-29 DIAGNOSIS — M2550 Pain in unspecified joint: Secondary | ICD-10-CM | POA: Diagnosis not present

## 2024-01-29 DIAGNOSIS — R262 Difficulty in walking, not elsewhere classified: Secondary | ICD-10-CM | POA: Diagnosis not present

## 2024-01-29 DIAGNOSIS — S52354D Nondisplaced comminuted fracture of shaft of radius, right arm, subsequent encounter for closed fracture with routine healing: Secondary | ICD-10-CM | POA: Diagnosis not present

## 2024-01-29 DIAGNOSIS — S52611D Displaced fracture of right ulna styloid process, subsequent encounter for closed fracture with routine healing: Secondary | ICD-10-CM | POA: Diagnosis not present

## 2024-01-29 DIAGNOSIS — R1311 Dysphagia, oral phase: Secondary | ICD-10-CM | POA: Diagnosis not present

## 2024-01-29 DIAGNOSIS — R293 Abnormal posture: Secondary | ICD-10-CM | POA: Diagnosis not present

## 2024-01-29 DIAGNOSIS — R278 Other lack of coordination: Secondary | ICD-10-CM | POA: Diagnosis not present

## 2024-01-29 DIAGNOSIS — J441 Chronic obstructive pulmonary disease with (acute) exacerbation: Secondary | ICD-10-CM | POA: Diagnosis not present

## 2024-01-29 DIAGNOSIS — D649 Anemia, unspecified: Secondary | ICD-10-CM | POA: Diagnosis not present

## 2024-01-29 DIAGNOSIS — M6281 Muscle weakness (generalized): Secondary | ICD-10-CM | POA: Diagnosis not present

## 2024-01-29 DIAGNOSIS — J9602 Acute respiratory failure with hypercapnia: Secondary | ICD-10-CM | POA: Diagnosis not present

## 2024-01-30 DIAGNOSIS — M6281 Muscle weakness (generalized): Secondary | ICD-10-CM | POA: Diagnosis not present

## 2024-01-30 DIAGNOSIS — J441 Chronic obstructive pulmonary disease with (acute) exacerbation: Secondary | ICD-10-CM | POA: Diagnosis not present

## 2024-01-30 DIAGNOSIS — R262 Difficulty in walking, not elsewhere classified: Secondary | ICD-10-CM | POA: Diagnosis not present

## 2024-01-31 DIAGNOSIS — J441 Chronic obstructive pulmonary disease with (acute) exacerbation: Secondary | ICD-10-CM | POA: Diagnosis not present

## 2024-01-31 DIAGNOSIS — R262 Difficulty in walking, not elsewhere classified: Secondary | ICD-10-CM | POA: Diagnosis not present

## 2024-01-31 DIAGNOSIS — M6281 Muscle weakness (generalized): Secondary | ICD-10-CM | POA: Diagnosis not present

## 2024-02-01 DIAGNOSIS — J441 Chronic obstructive pulmonary disease with (acute) exacerbation: Secondary | ICD-10-CM | POA: Diagnosis not present

## 2024-02-03 DIAGNOSIS — R262 Difficulty in walking, not elsewhere classified: Secondary | ICD-10-CM | POA: Diagnosis not present

## 2024-02-03 DIAGNOSIS — M6281 Muscle weakness (generalized): Secondary | ICD-10-CM | POA: Diagnosis not present

## 2024-02-03 DIAGNOSIS — J441 Chronic obstructive pulmonary disease with (acute) exacerbation: Secondary | ICD-10-CM | POA: Diagnosis not present

## 2024-02-04 DIAGNOSIS — J9602 Acute respiratory failure with hypercapnia: Secondary | ICD-10-CM | POA: Diagnosis not present

## 2024-02-04 DIAGNOSIS — R262 Difficulty in walking, not elsewhere classified: Secondary | ICD-10-CM | POA: Diagnosis not present

## 2024-02-04 DIAGNOSIS — J441 Chronic obstructive pulmonary disease with (acute) exacerbation: Secondary | ICD-10-CM | POA: Diagnosis not present

## 2024-02-04 DIAGNOSIS — R278 Other lack of coordination: Secondary | ICD-10-CM | POA: Diagnosis not present

## 2024-02-04 DIAGNOSIS — S52611D Displaced fracture of right ulna styloid process, subsequent encounter for closed fracture with routine healing: Secondary | ICD-10-CM | POA: Diagnosis not present

## 2024-02-04 DIAGNOSIS — R296 Repeated falls: Secondary | ICD-10-CM | POA: Diagnosis not present

## 2024-02-04 DIAGNOSIS — R1311 Dysphagia, oral phase: Secondary | ICD-10-CM | POA: Diagnosis not present

## 2024-02-04 DIAGNOSIS — R293 Abnormal posture: Secondary | ICD-10-CM | POA: Diagnosis not present

## 2024-02-04 DIAGNOSIS — D649 Anemia, unspecified: Secondary | ICD-10-CM | POA: Diagnosis not present

## 2024-02-04 DIAGNOSIS — M2550 Pain in unspecified joint: Secondary | ICD-10-CM | POA: Diagnosis not present

## 2024-02-04 DIAGNOSIS — G4700 Insomnia, unspecified: Secondary | ICD-10-CM | POA: Diagnosis not present

## 2024-02-04 DIAGNOSIS — M6281 Muscle weakness (generalized): Secondary | ICD-10-CM | POA: Diagnosis not present

## 2024-02-05 DIAGNOSIS — J441 Chronic obstructive pulmonary disease with (acute) exacerbation: Secondary | ICD-10-CM | POA: Diagnosis not present

## 2024-02-05 DIAGNOSIS — M6281 Muscle weakness (generalized): Secondary | ICD-10-CM | POA: Diagnosis not present

## 2024-02-05 DIAGNOSIS — R262 Difficulty in walking, not elsewhere classified: Secondary | ICD-10-CM | POA: Diagnosis not present

## 2024-02-06 DIAGNOSIS — G4700 Insomnia, unspecified: Secondary | ICD-10-CM | POA: Diagnosis not present

## 2024-02-06 DIAGNOSIS — R1311 Dysphagia, oral phase: Secondary | ICD-10-CM | POA: Diagnosis not present

## 2024-02-06 DIAGNOSIS — S52611D Displaced fracture of right ulna styloid process, subsequent encounter for closed fracture with routine healing: Secondary | ICD-10-CM | POA: Diagnosis not present

## 2024-02-06 DIAGNOSIS — M6281 Muscle weakness (generalized): Secondary | ICD-10-CM | POA: Diagnosis not present

## 2024-02-06 DIAGNOSIS — R278 Other lack of coordination: Secondary | ICD-10-CM | POA: Diagnosis not present

## 2024-02-06 DIAGNOSIS — J441 Chronic obstructive pulmonary disease with (acute) exacerbation: Secondary | ICD-10-CM | POA: Diagnosis not present

## 2024-02-06 DIAGNOSIS — R262 Difficulty in walking, not elsewhere classified: Secondary | ICD-10-CM | POA: Diagnosis not present

## 2024-02-06 DIAGNOSIS — R293 Abnormal posture: Secondary | ICD-10-CM | POA: Diagnosis not present

## 2024-02-06 DIAGNOSIS — D649 Anemia, unspecified: Secondary | ICD-10-CM | POA: Diagnosis not present

## 2024-02-06 DIAGNOSIS — R296 Repeated falls: Secondary | ICD-10-CM | POA: Diagnosis not present

## 2024-02-06 DIAGNOSIS — M79672 Pain in left foot: Secondary | ICD-10-CM | POA: Diagnosis not present

## 2024-02-06 DIAGNOSIS — M2550 Pain in unspecified joint: Secondary | ICD-10-CM | POA: Diagnosis not present

## 2024-02-06 DIAGNOSIS — J9602 Acute respiratory failure with hypercapnia: Secondary | ICD-10-CM | POA: Diagnosis not present

## 2024-02-07 DIAGNOSIS — J441 Chronic obstructive pulmonary disease with (acute) exacerbation: Secondary | ICD-10-CM | POA: Diagnosis not present

## 2024-02-07 DIAGNOSIS — R262 Difficulty in walking, not elsewhere classified: Secondary | ICD-10-CM | POA: Diagnosis not present

## 2024-02-07 DIAGNOSIS — M6281 Muscle weakness (generalized): Secondary | ICD-10-CM | POA: Diagnosis not present

## 2024-02-10 DIAGNOSIS — R262 Difficulty in walking, not elsewhere classified: Secondary | ICD-10-CM | POA: Diagnosis not present

## 2024-02-10 DIAGNOSIS — M6281 Muscle weakness (generalized): Secondary | ICD-10-CM | POA: Diagnosis not present

## 2024-02-10 DIAGNOSIS — J441 Chronic obstructive pulmonary disease with (acute) exacerbation: Secondary | ICD-10-CM | POA: Diagnosis not present

## 2024-02-11 DIAGNOSIS — R262 Difficulty in walking, not elsewhere classified: Secondary | ICD-10-CM | POA: Diagnosis not present

## 2024-02-11 DIAGNOSIS — J441 Chronic obstructive pulmonary disease with (acute) exacerbation: Secondary | ICD-10-CM | POA: Diagnosis not present

## 2024-02-12 DIAGNOSIS — R262 Difficulty in walking, not elsewhere classified: Secondary | ICD-10-CM | POA: Diagnosis not present

## 2024-02-12 DIAGNOSIS — J441 Chronic obstructive pulmonary disease with (acute) exacerbation: Secondary | ICD-10-CM | POA: Diagnosis not present

## 2024-02-13 DIAGNOSIS — D649 Anemia, unspecified: Secondary | ICD-10-CM | POA: Diagnosis not present

## 2024-02-13 DIAGNOSIS — J9602 Acute respiratory failure with hypercapnia: Secondary | ICD-10-CM | POA: Diagnosis not present

## 2024-02-13 DIAGNOSIS — R278 Other lack of coordination: Secondary | ICD-10-CM | POA: Diagnosis not present

## 2024-02-13 DIAGNOSIS — M2550 Pain in unspecified joint: Secondary | ICD-10-CM | POA: Diagnosis not present

## 2024-02-13 DIAGNOSIS — J441 Chronic obstructive pulmonary disease with (acute) exacerbation: Secondary | ICD-10-CM | POA: Diagnosis not present

## 2024-02-13 DIAGNOSIS — R296 Repeated falls: Secondary | ICD-10-CM | POA: Diagnosis not present

## 2024-02-13 DIAGNOSIS — R262 Difficulty in walking, not elsewhere classified: Secondary | ICD-10-CM | POA: Diagnosis not present

## 2024-02-13 DIAGNOSIS — R1311 Dysphagia, oral phase: Secondary | ICD-10-CM | POA: Diagnosis not present

## 2024-02-13 DIAGNOSIS — G4700 Insomnia, unspecified: Secondary | ICD-10-CM | POA: Diagnosis not present

## 2024-02-13 DIAGNOSIS — S52611D Displaced fracture of right ulna styloid process, subsequent encounter for closed fracture with routine healing: Secondary | ICD-10-CM | POA: Diagnosis not present

## 2024-02-13 DIAGNOSIS — R293 Abnormal posture: Secondary | ICD-10-CM | POA: Diagnosis not present

## 2024-02-14 DIAGNOSIS — R262 Difficulty in walking, not elsewhere classified: Secondary | ICD-10-CM | POA: Diagnosis not present

## 2024-02-14 DIAGNOSIS — J441 Chronic obstructive pulmonary disease with (acute) exacerbation: Secondary | ICD-10-CM | POA: Diagnosis not present

## 2024-02-18 DIAGNOSIS — J441 Chronic obstructive pulmonary disease with (acute) exacerbation: Secondary | ICD-10-CM | POA: Diagnosis not present

## 2024-02-18 DIAGNOSIS — R262 Difficulty in walking, not elsewhere classified: Secondary | ICD-10-CM | POA: Diagnosis not present

## 2024-02-19 DIAGNOSIS — J441 Chronic obstructive pulmonary disease with (acute) exacerbation: Secondary | ICD-10-CM | POA: Diagnosis not present

## 2024-02-19 DIAGNOSIS — R262 Difficulty in walking, not elsewhere classified: Secondary | ICD-10-CM | POA: Diagnosis not present

## 2024-02-20 DIAGNOSIS — J441 Chronic obstructive pulmonary disease with (acute) exacerbation: Secondary | ICD-10-CM | POA: Diagnosis not present

## 2024-02-20 DIAGNOSIS — R262 Difficulty in walking, not elsewhere classified: Secondary | ICD-10-CM | POA: Diagnosis not present

## 2024-02-21 DIAGNOSIS — R262 Difficulty in walking, not elsewhere classified: Secondary | ICD-10-CM | POA: Diagnosis not present

## 2024-02-21 DIAGNOSIS — J441 Chronic obstructive pulmonary disease with (acute) exacerbation: Secondary | ICD-10-CM | POA: Diagnosis not present

## 2024-02-22 DIAGNOSIS — J441 Chronic obstructive pulmonary disease with (acute) exacerbation: Secondary | ICD-10-CM | POA: Diagnosis not present

## 2024-02-22 DIAGNOSIS — R262 Difficulty in walking, not elsewhere classified: Secondary | ICD-10-CM | POA: Diagnosis not present

## 2024-02-24 DIAGNOSIS — R262 Difficulty in walking, not elsewhere classified: Secondary | ICD-10-CM | POA: Diagnosis not present

## 2024-02-24 DIAGNOSIS — J441 Chronic obstructive pulmonary disease with (acute) exacerbation: Secondary | ICD-10-CM | POA: Diagnosis not present

## 2024-02-25 DIAGNOSIS — R262 Difficulty in walking, not elsewhere classified: Secondary | ICD-10-CM | POA: Diagnosis not present

## 2024-02-25 DIAGNOSIS — J441 Chronic obstructive pulmonary disease with (acute) exacerbation: Secondary | ICD-10-CM | POA: Diagnosis not present

## 2024-02-26 DIAGNOSIS — R293 Abnormal posture: Secondary | ICD-10-CM | POA: Diagnosis not present

## 2024-02-26 DIAGNOSIS — J9602 Acute respiratory failure with hypercapnia: Secondary | ICD-10-CM | POA: Diagnosis not present

## 2024-02-26 DIAGNOSIS — M2550 Pain in unspecified joint: Secondary | ICD-10-CM | POA: Diagnosis not present

## 2024-02-26 DIAGNOSIS — S52611D Displaced fracture of right ulna styloid process, subsequent encounter for closed fracture with routine healing: Secondary | ICD-10-CM | POA: Diagnosis not present

## 2024-02-26 DIAGNOSIS — R1311 Dysphagia, oral phase: Secondary | ICD-10-CM | POA: Diagnosis not present

## 2024-02-26 DIAGNOSIS — R296 Repeated falls: Secondary | ICD-10-CM | POA: Diagnosis not present

## 2024-02-26 DIAGNOSIS — D649 Anemia, unspecified: Secondary | ICD-10-CM | POA: Diagnosis not present

## 2024-02-26 DIAGNOSIS — R262 Difficulty in walking, not elsewhere classified: Secondary | ICD-10-CM | POA: Diagnosis not present

## 2024-02-26 DIAGNOSIS — J441 Chronic obstructive pulmonary disease with (acute) exacerbation: Secondary | ICD-10-CM | POA: Diagnosis not present

## 2024-02-26 DIAGNOSIS — R278 Other lack of coordination: Secondary | ICD-10-CM | POA: Diagnosis not present

## 2024-02-26 DIAGNOSIS — G4700 Insomnia, unspecified: Secondary | ICD-10-CM | POA: Diagnosis not present

## 2024-02-27 DIAGNOSIS — R1311 Dysphagia, oral phase: Secondary | ICD-10-CM | POA: Diagnosis not present

## 2024-02-27 DIAGNOSIS — D649 Anemia, unspecified: Secondary | ICD-10-CM | POA: Diagnosis not present

## 2024-02-27 DIAGNOSIS — R278 Other lack of coordination: Secondary | ICD-10-CM | POA: Diagnosis not present

## 2024-02-27 DIAGNOSIS — J441 Chronic obstructive pulmonary disease with (acute) exacerbation: Secondary | ICD-10-CM | POA: Diagnosis not present

## 2024-02-27 DIAGNOSIS — S52611D Displaced fracture of right ulna styloid process, subsequent encounter for closed fracture with routine healing: Secondary | ICD-10-CM | POA: Diagnosis not present

## 2024-02-27 DIAGNOSIS — M2550 Pain in unspecified joint: Secondary | ICD-10-CM | POA: Diagnosis not present

## 2024-02-27 DIAGNOSIS — L603 Nail dystrophy: Secondary | ICD-10-CM | POA: Diagnosis not present

## 2024-02-27 DIAGNOSIS — I739 Peripheral vascular disease, unspecified: Secondary | ICD-10-CM | POA: Diagnosis not present

## 2024-02-27 DIAGNOSIS — R296 Repeated falls: Secondary | ICD-10-CM | POA: Diagnosis not present

## 2024-02-27 DIAGNOSIS — L602 Onychogryphosis: Secondary | ICD-10-CM | POA: Diagnosis not present

## 2024-02-27 DIAGNOSIS — R293 Abnormal posture: Secondary | ICD-10-CM | POA: Diagnosis not present

## 2024-02-27 DIAGNOSIS — G4700 Insomnia, unspecified: Secondary | ICD-10-CM | POA: Diagnosis not present

## 2024-02-27 DIAGNOSIS — S52354D Nondisplaced comminuted fracture of shaft of radius, right arm, subsequent encounter for closed fracture with routine healing: Secondary | ICD-10-CM | POA: Diagnosis not present

## 2024-02-27 DIAGNOSIS — J9602 Acute respiratory failure with hypercapnia: Secondary | ICD-10-CM | POA: Diagnosis not present

## 2024-02-27 DIAGNOSIS — R262 Difficulty in walking, not elsewhere classified: Secondary | ICD-10-CM | POA: Diagnosis not present

## 2024-02-28 DIAGNOSIS — R262 Difficulty in walking, not elsewhere classified: Secondary | ICD-10-CM | POA: Diagnosis not present

## 2024-02-28 DIAGNOSIS — J441 Chronic obstructive pulmonary disease with (acute) exacerbation: Secondary | ICD-10-CM | POA: Diagnosis not present

## 2024-03-01 DIAGNOSIS — J441 Chronic obstructive pulmonary disease with (acute) exacerbation: Secondary | ICD-10-CM | POA: Diagnosis not present

## 2024-03-01 DIAGNOSIS — G4700 Insomnia, unspecified: Secondary | ICD-10-CM | POA: Diagnosis not present

## 2024-03-01 DIAGNOSIS — J9602 Acute respiratory failure with hypercapnia: Secondary | ICD-10-CM | POA: Diagnosis not present

## 2024-03-01 DIAGNOSIS — G629 Polyneuropathy, unspecified: Secondary | ICD-10-CM | POA: Diagnosis not present

## 2024-03-01 DIAGNOSIS — R1311 Dysphagia, oral phase: Secondary | ICD-10-CM | POA: Diagnosis not present

## 2024-03-01 DIAGNOSIS — R262 Difficulty in walking, not elsewhere classified: Secondary | ICD-10-CM | POA: Diagnosis not present

## 2024-03-01 DIAGNOSIS — S52611D Displaced fracture of right ulna styloid process, subsequent encounter for closed fracture with routine healing: Secondary | ICD-10-CM | POA: Diagnosis not present

## 2024-03-01 DIAGNOSIS — R296 Repeated falls: Secondary | ICD-10-CM | POA: Diagnosis not present

## 2024-03-01 DIAGNOSIS — M2550 Pain in unspecified joint: Secondary | ICD-10-CM | POA: Diagnosis not present

## 2024-03-01 DIAGNOSIS — R293 Abnormal posture: Secondary | ICD-10-CM | POA: Diagnosis not present

## 2024-03-01 DIAGNOSIS — R278 Other lack of coordination: Secondary | ICD-10-CM | POA: Diagnosis not present

## 2024-03-03 DIAGNOSIS — J441 Chronic obstructive pulmonary disease with (acute) exacerbation: Secondary | ICD-10-CM | POA: Diagnosis not present

## 2024-03-03 DIAGNOSIS — R262 Difficulty in walking, not elsewhere classified: Secondary | ICD-10-CM | POA: Diagnosis not present

## 2024-03-04 DIAGNOSIS — R262 Difficulty in walking, not elsewhere classified: Secondary | ICD-10-CM | POA: Diagnosis not present

## 2024-03-04 DIAGNOSIS — R296 Repeated falls: Secondary | ICD-10-CM | POA: Diagnosis not present

## 2024-03-04 DIAGNOSIS — R293 Abnormal posture: Secondary | ICD-10-CM | POA: Diagnosis not present

## 2024-03-04 DIAGNOSIS — J9602 Acute respiratory failure with hypercapnia: Secondary | ICD-10-CM | POA: Diagnosis not present

## 2024-03-04 DIAGNOSIS — M2550 Pain in unspecified joint: Secondary | ICD-10-CM | POA: Diagnosis not present

## 2024-03-04 DIAGNOSIS — R1311 Dysphagia, oral phase: Secondary | ICD-10-CM | POA: Diagnosis not present

## 2024-03-04 DIAGNOSIS — R278 Other lack of coordination: Secondary | ICD-10-CM | POA: Diagnosis not present

## 2024-03-04 DIAGNOSIS — J441 Chronic obstructive pulmonary disease with (acute) exacerbation: Secondary | ICD-10-CM | POA: Diagnosis not present

## 2024-03-04 DIAGNOSIS — G4700 Insomnia, unspecified: Secondary | ICD-10-CM | POA: Diagnosis not present

## 2024-03-04 DIAGNOSIS — S52611D Displaced fracture of right ulna styloid process, subsequent encounter for closed fracture with routine healing: Secondary | ICD-10-CM | POA: Diagnosis not present

## 2024-03-04 DIAGNOSIS — D649 Anemia, unspecified: Secondary | ICD-10-CM | POA: Diagnosis not present

## 2024-03-08 DIAGNOSIS — G8921 Chronic pain due to trauma: Secondary | ICD-10-CM | POA: Diagnosis not present

## 2024-03-09 DIAGNOSIS — I10 Essential (primary) hypertension: Secondary | ICD-10-CM | POA: Diagnosis not present

## 2024-03-09 DIAGNOSIS — R262 Difficulty in walking, not elsewhere classified: Secondary | ICD-10-CM | POA: Diagnosis not present

## 2024-03-09 DIAGNOSIS — J441 Chronic obstructive pulmonary disease with (acute) exacerbation: Secondary | ICD-10-CM | POA: Diagnosis not present

## 2024-03-10 DIAGNOSIS — R1311 Dysphagia, oral phase: Secondary | ICD-10-CM | POA: Diagnosis not present

## 2024-03-10 DIAGNOSIS — R296 Repeated falls: Secondary | ICD-10-CM | POA: Diagnosis not present

## 2024-03-10 DIAGNOSIS — M2550 Pain in unspecified joint: Secondary | ICD-10-CM | POA: Diagnosis not present

## 2024-03-10 DIAGNOSIS — D649 Anemia, unspecified: Secondary | ICD-10-CM | POA: Diagnosis not present

## 2024-03-10 DIAGNOSIS — R262 Difficulty in walking, not elsewhere classified: Secondary | ICD-10-CM | POA: Diagnosis not present

## 2024-03-10 DIAGNOSIS — S52611D Displaced fracture of right ulna styloid process, subsequent encounter for closed fracture with routine healing: Secondary | ICD-10-CM | POA: Diagnosis not present

## 2024-03-10 DIAGNOSIS — G4700 Insomnia, unspecified: Secondary | ICD-10-CM | POA: Diagnosis not present

## 2024-03-10 DIAGNOSIS — J9602 Acute respiratory failure with hypercapnia: Secondary | ICD-10-CM | POA: Diagnosis not present

## 2024-03-10 DIAGNOSIS — R278 Other lack of coordination: Secondary | ICD-10-CM | POA: Diagnosis not present

## 2024-03-10 DIAGNOSIS — S52354D Nondisplaced comminuted fracture of shaft of radius, right arm, subsequent encounter for closed fracture with routine healing: Secondary | ICD-10-CM | POA: Diagnosis not present

## 2024-03-10 DIAGNOSIS — R293 Abnormal posture: Secondary | ICD-10-CM | POA: Diagnosis not present

## 2024-03-10 DIAGNOSIS — J441 Chronic obstructive pulmonary disease with (acute) exacerbation: Secondary | ICD-10-CM | POA: Diagnosis not present

## 2024-03-11 DIAGNOSIS — R262 Difficulty in walking, not elsewhere classified: Secondary | ICD-10-CM | POA: Diagnosis not present

## 2024-03-11 DIAGNOSIS — J441 Chronic obstructive pulmonary disease with (acute) exacerbation: Secondary | ICD-10-CM | POA: Diagnosis not present

## 2024-03-12 DIAGNOSIS — R262 Difficulty in walking, not elsewhere classified: Secondary | ICD-10-CM | POA: Diagnosis not present

## 2024-03-12 DIAGNOSIS — J441 Chronic obstructive pulmonary disease with (acute) exacerbation: Secondary | ICD-10-CM | POA: Diagnosis not present

## 2024-03-13 DIAGNOSIS — R262 Difficulty in walking, not elsewhere classified: Secondary | ICD-10-CM | POA: Diagnosis not present

## 2024-03-16 DIAGNOSIS — G4700 Insomnia, unspecified: Secondary | ICD-10-CM | POA: Diagnosis not present

## 2024-03-16 DIAGNOSIS — R1311 Dysphagia, oral phase: Secondary | ICD-10-CM | POA: Diagnosis not present

## 2024-03-16 DIAGNOSIS — R293 Abnormal posture: Secondary | ICD-10-CM | POA: Diagnosis not present

## 2024-03-16 DIAGNOSIS — R278 Other lack of coordination: Secondary | ICD-10-CM | POA: Diagnosis not present

## 2024-03-16 DIAGNOSIS — D649 Anemia, unspecified: Secondary | ICD-10-CM | POA: Diagnosis not present

## 2024-03-16 DIAGNOSIS — J441 Chronic obstructive pulmonary disease with (acute) exacerbation: Secondary | ICD-10-CM | POA: Diagnosis not present

## 2024-03-16 DIAGNOSIS — M2550 Pain in unspecified joint: Secondary | ICD-10-CM | POA: Diagnosis not present

## 2024-03-16 DIAGNOSIS — J9602 Acute respiratory failure with hypercapnia: Secondary | ICD-10-CM | POA: Diagnosis not present

## 2024-03-16 DIAGNOSIS — R262 Difficulty in walking, not elsewhere classified: Secondary | ICD-10-CM | POA: Diagnosis not present

## 2024-03-16 DIAGNOSIS — S52611D Displaced fracture of right ulna styloid process, subsequent encounter for closed fracture with routine healing: Secondary | ICD-10-CM | POA: Diagnosis not present

## 2024-03-16 DIAGNOSIS — R296 Repeated falls: Secondary | ICD-10-CM | POA: Diagnosis not present

## 2024-03-17 DIAGNOSIS — R262 Difficulty in walking, not elsewhere classified: Secondary | ICD-10-CM | POA: Diagnosis not present

## 2024-03-18 DIAGNOSIS — S52611D Displaced fracture of right ulna styloid process, subsequent encounter for closed fracture with routine healing: Secondary | ICD-10-CM | POA: Diagnosis not present

## 2024-03-18 DIAGNOSIS — J9602 Acute respiratory failure with hypercapnia: Secondary | ICD-10-CM | POA: Diagnosis not present

## 2024-03-18 DIAGNOSIS — R293 Abnormal posture: Secondary | ICD-10-CM | POA: Diagnosis not present

## 2024-03-18 DIAGNOSIS — M2550 Pain in unspecified joint: Secondary | ICD-10-CM | POA: Diagnosis not present

## 2024-03-18 DIAGNOSIS — J441 Chronic obstructive pulmonary disease with (acute) exacerbation: Secondary | ICD-10-CM | POA: Diagnosis not present

## 2024-03-18 DIAGNOSIS — R296 Repeated falls: Secondary | ICD-10-CM | POA: Diagnosis not present

## 2024-03-18 DIAGNOSIS — R1311 Dysphagia, oral phase: Secondary | ICD-10-CM | POA: Diagnosis not present

## 2024-03-18 DIAGNOSIS — R278 Other lack of coordination: Secondary | ICD-10-CM | POA: Diagnosis not present

## 2024-03-18 DIAGNOSIS — R262 Difficulty in walking, not elsewhere classified: Secondary | ICD-10-CM | POA: Diagnosis not present

## 2024-03-18 DIAGNOSIS — G4700 Insomnia, unspecified: Secondary | ICD-10-CM | POA: Diagnosis not present

## 2024-03-18 DIAGNOSIS — D649 Anemia, unspecified: Secondary | ICD-10-CM | POA: Diagnosis not present

## 2024-03-20 DIAGNOSIS — R262 Difficulty in walking, not elsewhere classified: Secondary | ICD-10-CM | POA: Diagnosis not present

## 2024-03-22 DIAGNOSIS — S52611D Displaced fracture of right ulna styloid process, subsequent encounter for closed fracture with routine healing: Secondary | ICD-10-CM | POA: Diagnosis not present

## 2024-03-22 DIAGNOSIS — R1311 Dysphagia, oral phase: Secondary | ICD-10-CM | POA: Diagnosis not present

## 2024-03-22 DIAGNOSIS — R262 Difficulty in walking, not elsewhere classified: Secondary | ICD-10-CM | POA: Diagnosis not present

## 2024-03-22 DIAGNOSIS — R278 Other lack of coordination: Secondary | ICD-10-CM | POA: Diagnosis not present

## 2024-03-22 DIAGNOSIS — R293 Abnormal posture: Secondary | ICD-10-CM | POA: Diagnosis not present

## 2024-03-22 DIAGNOSIS — J441 Chronic obstructive pulmonary disease with (acute) exacerbation: Secondary | ICD-10-CM | POA: Diagnosis not present

## 2024-03-22 DIAGNOSIS — G629 Polyneuropathy, unspecified: Secondary | ICD-10-CM | POA: Diagnosis not present

## 2024-03-22 DIAGNOSIS — G4700 Insomnia, unspecified: Secondary | ICD-10-CM | POA: Diagnosis not present

## 2024-03-22 DIAGNOSIS — M2550 Pain in unspecified joint: Secondary | ICD-10-CM | POA: Diagnosis not present

## 2024-03-22 DIAGNOSIS — J9602 Acute respiratory failure with hypercapnia: Secondary | ICD-10-CM | POA: Diagnosis not present

## 2024-03-22 DIAGNOSIS — R296 Repeated falls: Secondary | ICD-10-CM | POA: Diagnosis not present

## 2024-03-24 DIAGNOSIS — R278 Other lack of coordination: Secondary | ICD-10-CM | POA: Diagnosis not present

## 2024-03-24 DIAGNOSIS — R296 Repeated falls: Secondary | ICD-10-CM | POA: Diagnosis not present

## 2024-03-24 DIAGNOSIS — J441 Chronic obstructive pulmonary disease with (acute) exacerbation: Secondary | ICD-10-CM | POA: Diagnosis not present

## 2024-03-24 DIAGNOSIS — R1311 Dysphagia, oral phase: Secondary | ICD-10-CM | POA: Diagnosis not present

## 2024-03-24 DIAGNOSIS — S52611D Displaced fracture of right ulna styloid process, subsequent encounter for closed fracture with routine healing: Secondary | ICD-10-CM | POA: Diagnosis not present

## 2024-03-24 DIAGNOSIS — G629 Polyneuropathy, unspecified: Secondary | ICD-10-CM | POA: Diagnosis not present

## 2024-03-24 DIAGNOSIS — R262 Difficulty in walking, not elsewhere classified: Secondary | ICD-10-CM | POA: Diagnosis not present

## 2024-03-24 DIAGNOSIS — R293 Abnormal posture: Secondary | ICD-10-CM | POA: Diagnosis not present

## 2024-03-24 DIAGNOSIS — G4700 Insomnia, unspecified: Secondary | ICD-10-CM | POA: Diagnosis not present

## 2024-03-24 DIAGNOSIS — J9602 Acute respiratory failure with hypercapnia: Secondary | ICD-10-CM | POA: Diagnosis not present

## 2024-03-24 DIAGNOSIS — M2550 Pain in unspecified joint: Secondary | ICD-10-CM | POA: Diagnosis not present

## 2024-03-26 DIAGNOSIS — S52611D Displaced fracture of right ulna styloid process, subsequent encounter for closed fracture with routine healing: Secondary | ICD-10-CM | POA: Diagnosis not present

## 2024-03-26 DIAGNOSIS — R296 Repeated falls: Secondary | ICD-10-CM | POA: Diagnosis not present

## 2024-03-26 DIAGNOSIS — J9602 Acute respiratory failure with hypercapnia: Secondary | ICD-10-CM | POA: Diagnosis not present

## 2024-03-26 DIAGNOSIS — R278 Other lack of coordination: Secondary | ICD-10-CM | POA: Diagnosis not present

## 2024-03-26 DIAGNOSIS — R262 Difficulty in walking, not elsewhere classified: Secondary | ICD-10-CM | POA: Diagnosis not present

## 2024-03-26 DIAGNOSIS — S52354D Nondisplaced comminuted fracture of shaft of radius, right arm, subsequent encounter for closed fracture with routine healing: Secondary | ICD-10-CM | POA: Diagnosis not present

## 2024-03-26 DIAGNOSIS — G4700 Insomnia, unspecified: Secondary | ICD-10-CM | POA: Diagnosis not present

## 2024-03-26 DIAGNOSIS — J441 Chronic obstructive pulmonary disease with (acute) exacerbation: Secondary | ICD-10-CM | POA: Diagnosis not present

## 2024-03-26 DIAGNOSIS — G629 Polyneuropathy, unspecified: Secondary | ICD-10-CM | POA: Diagnosis not present

## 2024-03-26 DIAGNOSIS — R1311 Dysphagia, oral phase: Secondary | ICD-10-CM | POA: Diagnosis not present

## 2024-03-26 DIAGNOSIS — R293 Abnormal posture: Secondary | ICD-10-CM | POA: Diagnosis not present

## 2024-03-26 DIAGNOSIS — M2550 Pain in unspecified joint: Secondary | ICD-10-CM | POA: Diagnosis not present

## 2024-03-27 DIAGNOSIS — E611 Iron deficiency: Secondary | ICD-10-CM | POA: Diagnosis not present

## 2024-03-27 DIAGNOSIS — R945 Abnormal results of liver function studies: Secondary | ICD-10-CM | POA: Diagnosis not present

## 2024-03-27 DIAGNOSIS — E7800 Pure hypercholesterolemia, unspecified: Secondary | ICD-10-CM | POA: Diagnosis not present

## 2024-03-27 DIAGNOSIS — Z13220 Encounter for screening for lipoid disorders: Secondary | ICD-10-CM | POA: Diagnosis not present

## 2024-03-28 DIAGNOSIS — G629 Polyneuropathy, unspecified: Secondary | ICD-10-CM | POA: Diagnosis not present

## 2024-03-28 DIAGNOSIS — J441 Chronic obstructive pulmonary disease with (acute) exacerbation: Secondary | ICD-10-CM | POA: Diagnosis not present

## 2024-03-28 DIAGNOSIS — J9602 Acute respiratory failure with hypercapnia: Secondary | ICD-10-CM | POA: Diagnosis not present

## 2024-03-28 DIAGNOSIS — M2550 Pain in unspecified joint: Secondary | ICD-10-CM | POA: Diagnosis not present

## 2024-03-28 DIAGNOSIS — R296 Repeated falls: Secondary | ICD-10-CM | POA: Diagnosis not present

## 2024-03-28 DIAGNOSIS — R293 Abnormal posture: Secondary | ICD-10-CM | POA: Diagnosis not present

## 2024-03-28 DIAGNOSIS — R278 Other lack of coordination: Secondary | ICD-10-CM | POA: Diagnosis not present

## 2024-03-28 DIAGNOSIS — G4700 Insomnia, unspecified: Secondary | ICD-10-CM | POA: Diagnosis not present

## 2024-03-28 DIAGNOSIS — R1311 Dysphagia, oral phase: Secondary | ICD-10-CM | POA: Diagnosis not present

## 2024-03-28 DIAGNOSIS — R262 Difficulty in walking, not elsewhere classified: Secondary | ICD-10-CM | POA: Diagnosis not present

## 2024-03-28 DIAGNOSIS — S52611D Displaced fracture of right ulna styloid process, subsequent encounter for closed fracture with routine healing: Secondary | ICD-10-CM | POA: Diagnosis not present

## 2024-03-29 DIAGNOSIS — J441 Chronic obstructive pulmonary disease with (acute) exacerbation: Secondary | ICD-10-CM | POA: Diagnosis not present

## 2024-03-29 DIAGNOSIS — R278 Other lack of coordination: Secondary | ICD-10-CM | POA: Diagnosis not present

## 2024-03-29 DIAGNOSIS — R262 Difficulty in walking, not elsewhere classified: Secondary | ICD-10-CM | POA: Diagnosis not present

## 2024-03-29 DIAGNOSIS — R293 Abnormal posture: Secondary | ICD-10-CM | POA: Diagnosis not present

## 2024-03-29 DIAGNOSIS — M2550 Pain in unspecified joint: Secondary | ICD-10-CM | POA: Diagnosis not present

## 2024-03-29 DIAGNOSIS — R1311 Dysphagia, oral phase: Secondary | ICD-10-CM | POA: Diagnosis not present

## 2024-03-29 DIAGNOSIS — J9602 Acute respiratory failure with hypercapnia: Secondary | ICD-10-CM | POA: Diagnosis not present

## 2024-03-29 DIAGNOSIS — S52354D Nondisplaced comminuted fracture of shaft of radius, right arm, subsequent encounter for closed fracture with routine healing: Secondary | ICD-10-CM | POA: Diagnosis not present

## 2024-03-29 DIAGNOSIS — R296 Repeated falls: Secondary | ICD-10-CM | POA: Diagnosis not present

## 2024-03-29 DIAGNOSIS — G629 Polyneuropathy, unspecified: Secondary | ICD-10-CM | POA: Diagnosis not present

## 2024-03-29 DIAGNOSIS — S52611D Displaced fracture of right ulna styloid process, subsequent encounter for closed fracture with routine healing: Secondary | ICD-10-CM | POA: Diagnosis not present

## 2024-03-29 DIAGNOSIS — G4700 Insomnia, unspecified: Secondary | ICD-10-CM | POA: Diagnosis not present

## 2024-04-02 DIAGNOSIS — G4700 Insomnia, unspecified: Secondary | ICD-10-CM | POA: Diagnosis not present

## 2024-04-02 DIAGNOSIS — R278 Other lack of coordination: Secondary | ICD-10-CM | POA: Diagnosis not present

## 2024-04-02 DIAGNOSIS — S52611D Displaced fracture of right ulna styloid process, subsequent encounter for closed fracture with routine healing: Secondary | ICD-10-CM | POA: Diagnosis not present

## 2024-04-02 DIAGNOSIS — R262 Difficulty in walking, not elsewhere classified: Secondary | ICD-10-CM | POA: Diagnosis not present

## 2024-04-02 DIAGNOSIS — M2550 Pain in unspecified joint: Secondary | ICD-10-CM | POA: Diagnosis not present

## 2024-04-02 DIAGNOSIS — G629 Polyneuropathy, unspecified: Secondary | ICD-10-CM | POA: Diagnosis not present

## 2024-04-02 DIAGNOSIS — R296 Repeated falls: Secondary | ICD-10-CM | POA: Diagnosis not present

## 2024-04-02 DIAGNOSIS — R293 Abnormal posture: Secondary | ICD-10-CM | POA: Diagnosis not present

## 2024-04-02 DIAGNOSIS — R1311 Dysphagia, oral phase: Secondary | ICD-10-CM | POA: Diagnosis not present

## 2024-04-02 DIAGNOSIS — J9602 Acute respiratory failure with hypercapnia: Secondary | ICD-10-CM | POA: Diagnosis not present

## 2024-04-02 DIAGNOSIS — J441 Chronic obstructive pulmonary disease with (acute) exacerbation: Secondary | ICD-10-CM | POA: Diagnosis not present

## 2024-04-03 DIAGNOSIS — M2550 Pain in unspecified joint: Secondary | ICD-10-CM | POA: Diagnosis not present

## 2024-04-03 DIAGNOSIS — R262 Difficulty in walking, not elsewhere classified: Secondary | ICD-10-CM | POA: Diagnosis not present

## 2024-04-03 DIAGNOSIS — G629 Polyneuropathy, unspecified: Secondary | ICD-10-CM | POA: Diagnosis not present

## 2024-04-03 DIAGNOSIS — J9602 Acute respiratory failure with hypercapnia: Secondary | ICD-10-CM | POA: Diagnosis not present

## 2024-04-03 DIAGNOSIS — R278 Other lack of coordination: Secondary | ICD-10-CM | POA: Diagnosis not present

## 2024-04-03 DIAGNOSIS — J441 Chronic obstructive pulmonary disease with (acute) exacerbation: Secondary | ICD-10-CM | POA: Diagnosis not present

## 2024-04-03 DIAGNOSIS — R296 Repeated falls: Secondary | ICD-10-CM | POA: Diagnosis not present

## 2024-04-03 DIAGNOSIS — R293 Abnormal posture: Secondary | ICD-10-CM | POA: Diagnosis not present

## 2024-04-03 DIAGNOSIS — G4700 Insomnia, unspecified: Secondary | ICD-10-CM | POA: Diagnosis not present

## 2024-04-03 DIAGNOSIS — R1311 Dysphagia, oral phase: Secondary | ICD-10-CM | POA: Diagnosis not present

## 2024-04-03 DIAGNOSIS — S52611D Displaced fracture of right ulna styloid process, subsequent encounter for closed fracture with routine healing: Secondary | ICD-10-CM | POA: Diagnosis not present

## 2024-04-06 DIAGNOSIS — I10 Essential (primary) hypertension: Secondary | ICD-10-CM | POA: Diagnosis not present

## 2024-04-18 DIAGNOSIS — R52 Pain, unspecified: Secondary | ICD-10-CM | POA: Diagnosis not present

## 2024-04-20 DIAGNOSIS — M2550 Pain in unspecified joint: Secondary | ICD-10-CM | POA: Diagnosis not present

## 2024-04-20 DIAGNOSIS — R293 Abnormal posture: Secondary | ICD-10-CM | POA: Diagnosis not present

## 2024-04-20 DIAGNOSIS — G4700 Insomnia, unspecified: Secondary | ICD-10-CM | POA: Diagnosis not present

## 2024-04-20 DIAGNOSIS — R278 Other lack of coordination: Secondary | ICD-10-CM | POA: Diagnosis not present

## 2024-04-20 DIAGNOSIS — R262 Difficulty in walking, not elsewhere classified: Secondary | ICD-10-CM | POA: Diagnosis not present

## 2024-04-20 DIAGNOSIS — R1311 Dysphagia, oral phase: Secondary | ICD-10-CM | POA: Diagnosis not present

## 2024-04-20 DIAGNOSIS — J441 Chronic obstructive pulmonary disease with (acute) exacerbation: Secondary | ICD-10-CM | POA: Diagnosis not present

## 2024-04-20 DIAGNOSIS — J9602 Acute respiratory failure with hypercapnia: Secondary | ICD-10-CM | POA: Diagnosis not present

## 2024-04-20 DIAGNOSIS — R296 Repeated falls: Secondary | ICD-10-CM | POA: Diagnosis not present

## 2024-04-20 DIAGNOSIS — G629 Polyneuropathy, unspecified: Secondary | ICD-10-CM | POA: Diagnosis not present

## 2024-04-20 DIAGNOSIS — S52611D Displaced fracture of right ulna styloid process, subsequent encounter for closed fracture with routine healing: Secondary | ICD-10-CM | POA: Diagnosis not present

## 2024-04-26 DIAGNOSIS — G4700 Insomnia, unspecified: Secondary | ICD-10-CM | POA: Diagnosis not present

## 2024-04-26 DIAGNOSIS — R278 Other lack of coordination: Secondary | ICD-10-CM | POA: Diagnosis not present

## 2024-04-26 DIAGNOSIS — J441 Chronic obstructive pulmonary disease with (acute) exacerbation: Secondary | ICD-10-CM | POA: Diagnosis not present

## 2024-04-26 DIAGNOSIS — R262 Difficulty in walking, not elsewhere classified: Secondary | ICD-10-CM | POA: Diagnosis not present

## 2024-04-26 DIAGNOSIS — G629 Polyneuropathy, unspecified: Secondary | ICD-10-CM | POA: Diagnosis not present

## 2024-04-26 DIAGNOSIS — S52611D Displaced fracture of right ulna styloid process, subsequent encounter for closed fracture with routine healing: Secondary | ICD-10-CM | POA: Diagnosis not present

## 2024-04-26 DIAGNOSIS — R1311 Dysphagia, oral phase: Secondary | ICD-10-CM | POA: Diagnosis not present

## 2024-04-26 DIAGNOSIS — R293 Abnormal posture: Secondary | ICD-10-CM | POA: Diagnosis not present

## 2024-04-26 DIAGNOSIS — R296 Repeated falls: Secondary | ICD-10-CM | POA: Diagnosis not present

## 2024-04-26 DIAGNOSIS — M2550 Pain in unspecified joint: Secondary | ICD-10-CM | POA: Diagnosis not present

## 2024-04-26 DIAGNOSIS — J9602 Acute respiratory failure with hypercapnia: Secondary | ICD-10-CM | POA: Diagnosis not present

## 2024-04-29 DIAGNOSIS — R293 Abnormal posture: Secondary | ICD-10-CM | POA: Diagnosis not present

## 2024-04-29 DIAGNOSIS — R1311 Dysphagia, oral phase: Secondary | ICD-10-CM | POA: Diagnosis not present

## 2024-04-29 DIAGNOSIS — R262 Difficulty in walking, not elsewhere classified: Secondary | ICD-10-CM | POA: Diagnosis not present

## 2024-04-29 DIAGNOSIS — J441 Chronic obstructive pulmonary disease with (acute) exacerbation: Secondary | ICD-10-CM | POA: Diagnosis not present

## 2024-04-29 DIAGNOSIS — L603 Nail dystrophy: Secondary | ICD-10-CM | POA: Diagnosis not present

## 2024-04-29 DIAGNOSIS — S52611D Displaced fracture of right ulna styloid process, subsequent encounter for closed fracture with routine healing: Secondary | ICD-10-CM | POA: Diagnosis not present

## 2024-04-29 DIAGNOSIS — R278 Other lack of coordination: Secondary | ICD-10-CM | POA: Diagnosis not present

## 2024-04-29 DIAGNOSIS — G4700 Insomnia, unspecified: Secondary | ICD-10-CM | POA: Diagnosis not present

## 2024-04-29 DIAGNOSIS — R296 Repeated falls: Secondary | ICD-10-CM | POA: Diagnosis not present

## 2024-04-29 DIAGNOSIS — G629 Polyneuropathy, unspecified: Secondary | ICD-10-CM | POA: Diagnosis not present

## 2024-04-29 DIAGNOSIS — L602 Onychogryphosis: Secondary | ICD-10-CM | POA: Diagnosis not present

## 2024-04-29 DIAGNOSIS — J9602 Acute respiratory failure with hypercapnia: Secondary | ICD-10-CM | POA: Diagnosis not present

## 2024-04-29 DIAGNOSIS — M2550 Pain in unspecified joint: Secondary | ICD-10-CM | POA: Diagnosis not present

## 2024-05-11 DIAGNOSIS — I10 Essential (primary) hypertension: Secondary | ICD-10-CM | POA: Diagnosis not present

## 2024-05-14 DIAGNOSIS — G4700 Insomnia, unspecified: Secondary | ICD-10-CM | POA: Diagnosis not present

## 2024-05-14 DIAGNOSIS — R296 Repeated falls: Secondary | ICD-10-CM | POA: Diagnosis not present

## 2024-05-14 DIAGNOSIS — J9602 Acute respiratory failure with hypercapnia: Secondary | ICD-10-CM | POA: Diagnosis not present

## 2024-05-14 DIAGNOSIS — G629 Polyneuropathy, unspecified: Secondary | ICD-10-CM | POA: Diagnosis not present

## 2024-05-14 DIAGNOSIS — M2550 Pain in unspecified joint: Secondary | ICD-10-CM | POA: Diagnosis not present

## 2024-05-14 DIAGNOSIS — R278 Other lack of coordination: Secondary | ICD-10-CM | POA: Diagnosis not present

## 2024-05-14 DIAGNOSIS — J441 Chronic obstructive pulmonary disease with (acute) exacerbation: Secondary | ICD-10-CM | POA: Diagnosis not present

## 2024-05-14 DIAGNOSIS — R262 Difficulty in walking, not elsewhere classified: Secondary | ICD-10-CM | POA: Diagnosis not present

## 2024-05-14 DIAGNOSIS — R293 Abnormal posture: Secondary | ICD-10-CM | POA: Diagnosis not present

## 2024-05-14 DIAGNOSIS — S52611D Displaced fracture of right ulna styloid process, subsequent encounter for closed fracture with routine healing: Secondary | ICD-10-CM | POA: Diagnosis not present

## 2024-05-14 DIAGNOSIS — R1311 Dysphagia, oral phase: Secondary | ICD-10-CM | POA: Diagnosis not present

## 2024-05-23 DIAGNOSIS — G4700 Insomnia, unspecified: Secondary | ICD-10-CM | POA: Diagnosis not present

## 2024-05-23 DIAGNOSIS — M2550 Pain in unspecified joint: Secondary | ICD-10-CM | POA: Diagnosis not present

## 2024-05-23 DIAGNOSIS — R296 Repeated falls: Secondary | ICD-10-CM | POA: Diagnosis not present

## 2024-05-23 DIAGNOSIS — J9602 Acute respiratory failure with hypercapnia: Secondary | ICD-10-CM | POA: Diagnosis not present

## 2024-05-23 DIAGNOSIS — S52611D Displaced fracture of right ulna styloid process, subsequent encounter for closed fracture with routine healing: Secondary | ICD-10-CM | POA: Diagnosis not present

## 2024-05-23 DIAGNOSIS — R1311 Dysphagia, oral phase: Secondary | ICD-10-CM | POA: Diagnosis not present

## 2024-05-23 DIAGNOSIS — R262 Difficulty in walking, not elsewhere classified: Secondary | ICD-10-CM | POA: Diagnosis not present

## 2024-05-23 DIAGNOSIS — R293 Abnormal posture: Secondary | ICD-10-CM | POA: Diagnosis not present

## 2024-05-23 DIAGNOSIS — J441 Chronic obstructive pulmonary disease with (acute) exacerbation: Secondary | ICD-10-CM | POA: Diagnosis not present

## 2024-05-23 DIAGNOSIS — G629 Polyneuropathy, unspecified: Secondary | ICD-10-CM | POA: Diagnosis not present

## 2024-05-23 DIAGNOSIS — R278 Other lack of coordination: Secondary | ICD-10-CM | POA: Diagnosis not present

## 2024-05-29 DIAGNOSIS — G629 Polyneuropathy, unspecified: Secondary | ICD-10-CM | POA: Diagnosis not present

## 2024-05-29 DIAGNOSIS — R1311 Dysphagia, oral phase: Secondary | ICD-10-CM | POA: Diagnosis not present

## 2024-05-29 DIAGNOSIS — J9602 Acute respiratory failure with hypercapnia: Secondary | ICD-10-CM | POA: Diagnosis not present

## 2024-05-29 DIAGNOSIS — J441 Chronic obstructive pulmonary disease with (acute) exacerbation: Secondary | ICD-10-CM | POA: Diagnosis not present

## 2024-05-29 DIAGNOSIS — R293 Abnormal posture: Secondary | ICD-10-CM | POA: Diagnosis not present

## 2024-05-29 DIAGNOSIS — R296 Repeated falls: Secondary | ICD-10-CM | POA: Diagnosis not present

## 2024-05-29 DIAGNOSIS — R278 Other lack of coordination: Secondary | ICD-10-CM | POA: Diagnosis not present

## 2024-05-29 DIAGNOSIS — S52611D Displaced fracture of right ulna styloid process, subsequent encounter for closed fracture with routine healing: Secondary | ICD-10-CM | POA: Diagnosis not present

## 2024-05-29 DIAGNOSIS — G4700 Insomnia, unspecified: Secondary | ICD-10-CM | POA: Diagnosis not present

## 2024-05-29 DIAGNOSIS — M2550 Pain in unspecified joint: Secondary | ICD-10-CM | POA: Diagnosis not present

## 2024-05-29 DIAGNOSIS — R262 Difficulty in walking, not elsewhere classified: Secondary | ICD-10-CM | POA: Diagnosis not present
